# Patient Record
Sex: Male | Born: 1960 | Race: Black or African American | Hispanic: No | Marital: Married | Smoking: Former smoker
Health system: Southern US, Community
[De-identification: ages and names within clinical notes are randomized; demographics above are authoritative.]

## PROBLEM LIST (undated history)

## (undated) DIAGNOSIS — I77819 Aortic ectasia, unspecified site: Secondary | ICD-10-CM

## (undated) DIAGNOSIS — I428 Other cardiomyopathies: Secondary | ICD-10-CM

## (undated) DIAGNOSIS — M069 Rheumatoid arthritis, unspecified: Secondary | ICD-10-CM

## (undated) DIAGNOSIS — I4892 Unspecified atrial flutter: Secondary | ICD-10-CM

## (undated) DIAGNOSIS — L930 Discoid lupus erythematosus: Secondary | ICD-10-CM

## (undated) DIAGNOSIS — K219 Gastro-esophageal reflux disease without esophagitis: Secondary | ICD-10-CM

## (undated) DIAGNOSIS — I96 Gangrene, not elsewhere classified: Secondary | ICD-10-CM

## (undated) DIAGNOSIS — I77810 Thoracic aortic ectasia: Secondary | ICD-10-CM

## (undated) DIAGNOSIS — I43 Cardiomyopathy in diseases classified elsewhere: Secondary | ICD-10-CM

## (undated) DIAGNOSIS — M109 Gout, unspecified: Secondary | ICD-10-CM

## (undated) DIAGNOSIS — R000 Tachycardia, unspecified: Secondary | ICD-10-CM

## (undated) DIAGNOSIS — Z0389 Encounter for observation for other suspected diseases and conditions ruled out: Secondary | ICD-10-CM

## (undated) DIAGNOSIS — I10 Essential (primary) hypertension: Secondary | ICD-10-CM

## (undated) DIAGNOSIS — IMO0001 Reserved for inherently not codable concepts without codable children: Secondary | ICD-10-CM

## (undated) DIAGNOSIS — D860 Sarcoidosis of lung: Secondary | ICD-10-CM

## (undated) DIAGNOSIS — J8410 Pulmonary fibrosis, unspecified: Secondary | ICD-10-CM

## (undated) DIAGNOSIS — Z9289 Personal history of other medical treatment: Secondary | ICD-10-CM

## (undated) DIAGNOSIS — D589 Hereditary hemolytic anemia, unspecified: Secondary | ICD-10-CM

## (undated) DIAGNOSIS — I471 Supraventricular tachycardia, unspecified: Secondary | ICD-10-CM

## (undated) DIAGNOSIS — D649 Anemia, unspecified: Secondary | ICD-10-CM

## (undated) HISTORY — DX: Unspecified atrial flutter: I48.92

## (undated) HISTORY — DX: Essential (primary) hypertension: I10

## (undated) HISTORY — DX: Cardiomyopathy in diseases classified elsewhere: R00.0

## (undated) HISTORY — DX: Supraventricular tachycardia, unspecified: I47.10

## (undated) HISTORY — DX: Discoid lupus erythematosus: L93.0

## (undated) HISTORY — DX: Tachycardia, unspecified: I43

## (undated) HISTORY — DX: Anemia, unspecified: D64.9

## (undated) HISTORY — DX: Gout, unspecified: M10.9

## (undated) HISTORY — DX: Thoracic aortic ectasia: I77.810

## (undated) HISTORY — DX: Aortic ectasia, unspecified site: I77.819

## (undated) HISTORY — DX: Gangrene, not elsewhere classified: I96

## (undated) HISTORY — DX: Other cardiomyopathies: I42.8

## (undated) HISTORY — DX: Personal history of other medical treatment: Z92.89

## (undated) HISTORY — DX: Sarcoidosis of lung: D86.0

## (undated) HISTORY — DX: Rheumatoid arthritis, unspecified: M06.9

## (undated) HISTORY — DX: Pulmonary fibrosis, unspecified: J84.10

## (undated) HISTORY — PX: HAND SURGERY: SHX662

## (undated) HISTORY — DX: Hereditary hemolytic anemia, unspecified: D58.9

---

## 2004-08-30 ENCOUNTER — Ambulatory Visit: Payer: Self-pay | Admitting: Family Medicine

## 2007-05-11 ENCOUNTER — Ambulatory Visit: Payer: Self-pay | Admitting: Emergency Medicine

## 2007-05-11 ENCOUNTER — Other Ambulatory Visit: Payer: Self-pay

## 2010-04-10 ENCOUNTER — Emergency Department: Payer: Self-pay | Admitting: Emergency Medicine

## 2010-06-20 ENCOUNTER — Ambulatory Visit: Payer: Self-pay | Admitting: Family Medicine

## 2010-09-14 ENCOUNTER — Emergency Department: Payer: Self-pay | Admitting: Emergency Medicine

## 2011-11-22 ENCOUNTER — Ambulatory Visit: Payer: Self-pay | Admitting: Family Medicine

## 2013-01-07 ENCOUNTER — Ambulatory Visit: Payer: Self-pay | Admitting: Family Medicine

## 2013-03-10 ENCOUNTER — Ambulatory Visit: Payer: Self-pay | Admitting: Family Medicine

## 2013-08-17 ENCOUNTER — Ambulatory Visit: Payer: Self-pay | Admitting: Physician Assistant

## 2013-09-02 ENCOUNTER — Ambulatory Visit: Payer: Self-pay | Admitting: Emergency Medicine

## 2013-09-11 ENCOUNTER — Ambulatory Visit: Payer: Self-pay | Admitting: Emergency Medicine

## 2013-09-23 ENCOUNTER — Emergency Department: Payer: Self-pay | Admitting: Emergency Medicine

## 2013-09-23 LAB — BASIC METABOLIC PANEL
Anion Gap: 7 (ref 7–16)
BUN: 15 mg/dL (ref 7–18)
CHLORIDE: 101 mmol/L (ref 98–107)
Calcium, Total: 9.4 mg/dL (ref 8.5–10.1)
Co2: 28 mmol/L (ref 21–32)
Creatinine: 0.65 mg/dL (ref 0.60–1.30)
EGFR (Non-African Amer.): >60
Glucose: 114 mg/dL — ABNORMAL HIGH (ref 65–99)
Osmolality: 274 (ref 275–301)
Potassium: 3.8 mmol/L (ref 3.5–5.1)
Sodium: 136 mmol/L (ref 136–145)

## 2013-09-23 LAB — CBC
HCT: 34 % — ABNORMAL LOW (ref 40.0–52.0)
HGB: 11 g/dL — ABNORMAL LOW (ref 13.0–18.0)
MCH: 26.7 pg (ref 26.0–34.0)
MCHC: 32.4 g/dL (ref 32.0–36.0)
MCV: 82 fL (ref 80–100)
Platelet: 229 10*3/uL (ref 150–440)
RBC: 4.12 10*6/uL — ABNORMAL LOW (ref 4.40–5.90)
RDW: 15 % — AB (ref 11.5–14.5)
WBC: 7.4 10*3/uL (ref 3.8–10.6)

## 2013-09-23 LAB — TROPONIN I
TROPONIN-I: 0.06 ng/mL — AB
Troponin-I: 0.05 ng/mL

## 2013-09-23 LAB — D-DIMER(ARMC): D-Dimer: 971 ng/ml

## 2013-09-23 LAB — PRO B NATRIURETIC PEPTIDE: B-Type Natriuretic Peptide: 165 pg/mL — ABNORMAL HIGH (ref 0–125)

## 2013-11-02 DIAGNOSIS — R590 Localized enlarged lymph nodes: Secondary | ICD-10-CM

## 2013-11-02 HISTORY — DX: Localized enlarged lymph nodes: R59.0

## 2013-11-14 ENCOUNTER — Ambulatory Visit: Payer: Self-pay | Admitting: Gastroenterology

## 2013-11-20 LAB — PATHOLOGY REPORT

## 2013-12-26 ENCOUNTER — Ambulatory Visit: Payer: Self-pay | Admitting: Specialist

## 2014-01-06 ENCOUNTER — Ambulatory Visit: Payer: Self-pay | Admitting: Family Medicine

## 2014-02-03 DIAGNOSIS — M351 Other overlap syndromes: Secondary | ICD-10-CM | POA: Insufficient documentation

## 2014-04-20 DIAGNOSIS — E041 Nontoxic single thyroid nodule: Secondary | ICD-10-CM | POA: Insufficient documentation

## 2014-08-10 ENCOUNTER — Ambulatory Visit: Payer: Self-pay | Admitting: Physician Assistant

## 2014-08-17 ENCOUNTER — Ambulatory Visit: Payer: Self-pay | Admitting: Physician Assistant

## 2014-08-19 ENCOUNTER — Ambulatory Visit: Payer: Self-pay | Admitting: Family Medicine

## 2014-08-19 LAB — D-DIMER(ARMC): D-DIMER: 976 ng/mL

## 2014-08-20 LAB — BASIC METABOLIC PANEL
Anion Gap: 10 (ref 7–16)
BUN: 20 mg/dL
CHLORIDE: 99 mmol/L — AB
Calcium, Total: 9.3 mg/dL
Co2: 27 mmol/L
Creatinine: 1.08 mg/dL
EGFR (African American): >60
EGFR (Non-African Amer.): >60
Glucose: 87 mg/dL
POTASSIUM: 3.6 mmol/L
Sodium: 136 mmol/L

## 2014-08-20 LAB — CBC WITH DIFFERENTIAL/PLATELET
BASOS ABS: 0.1 10*3/uL (ref 0.0–0.1)
Basophil %: 0.8 %
EOS ABS: 0.1 10*3/uL (ref 0.0–0.7)
Eosinophil %: 0.7 %
HCT: 37 % — AB (ref 40.0–52.0)
HGB: 12.2 g/dL — ABNORMAL LOW (ref 13.0–18.0)
LYMPHS PCT: 12.1 %
Lymphocyte #: 1.2 10*3/uL (ref 1.0–3.6)
MCH: 27.3 pg (ref 26.0–34.0)
MCHC: 32.9 g/dL (ref 32.0–36.0)
MCV: 83 fL (ref 80–100)
MONO ABS: 1.1 x10 3/mm — AB (ref 0.2–1.0)
Monocyte %: 11.2 %
NEUTROS ABS: 7.7 10*3/uL — AB (ref 1.4–6.5)
Neutrophil %: 75.2 %
Platelet: 253 10*3/uL (ref 150–440)
RBC: 4.46 10*6/uL (ref 4.40–5.90)
RDW: 14.9 % — AB (ref 11.5–14.5)
WBC: 10.3 10*3/uL (ref 3.8–10.6)

## 2014-08-24 ENCOUNTER — Inpatient Hospital Stay
Admit: 2014-08-24 | Disposition: A | Discharge status: Home / Self Care | Payer: Self-pay | Attending: Internal Medicine | Admitting: Internal Medicine

## 2014-08-24 ENCOUNTER — Ambulatory Visit: Payer: Self-pay | Admitting: Gastroenterology

## 2014-08-24 LAB — CBC
HCT: 36.9 % — AB (ref 40.0–52.0)
HGB: 11.8 g/dL — AB (ref 13.0–18.0)
MCH: 27.1 pg (ref 26.0–34.0)
MCHC: 32.1 g/dL (ref 32.0–36.0)
MCV: 84 fL (ref 80–100)
Platelet: 289 10*3/uL (ref 150–440)
RBC: 4.37 10*6/uL — AB (ref 4.40–5.90)
RDW: 15 % — ABNORMAL HIGH (ref 11.5–14.5)
WBC: 8.7 10*3/uL (ref 3.8–10.6)

## 2014-08-24 LAB — BASIC METABOLIC PANEL
Anion Gap: 6 — ABNORMAL LOW (ref 7–16)
BUN: 16 mg/dL
Calcium, Total: 9 mg/dL
Chloride: 104 mmol/L
Co2: 29 mmol/L
Creatinine: 0.97 mg/dL
Glucose: 75 mg/dL
Potassium: 4 mmol/L
SODIUM: 139 mmol/L

## 2014-08-24 LAB — TROPONIN I
Troponin-I: 0.05 ng/mL — ABNORMAL HIGH
Troponin-I: 0.05 ng/mL — ABNORMAL HIGH

## 2014-08-24 LAB — CK-MB
CK-MB: 16.3 ng/mL — ABNORMAL HIGH
CK-MB: 14.3 ng/mL — ABNORMAL HIGH

## 2014-08-25 DIAGNOSIS — I361 Nonrheumatic tricuspid (valve) insufficiency: Secondary | ICD-10-CM | POA: Diagnosis not present

## 2014-08-25 DIAGNOSIS — I5023 Acute on chronic systolic (congestive) heart failure: Secondary | ICD-10-CM

## 2014-08-25 DIAGNOSIS — I4892 Unspecified atrial flutter: Secondary | ICD-10-CM

## 2014-08-25 DIAGNOSIS — R7989 Other specified abnormal findings of blood chemistry: Secondary | ICD-10-CM | POA: Diagnosis not present

## 2014-08-25 LAB — CBC WITH DIFFERENTIAL/PLATELET
BASOS ABS: 0 10*3/uL (ref 0.0–0.1)
Basophil %: 0.5 %
Eosinophil #: 0.2 10*3/uL (ref 0.0–0.7)
Eosinophil %: 2.3 %
HCT: 33.4 % — AB (ref 40.0–52.0)
HGB: 10.7 g/dL — ABNORMAL LOW (ref 13.0–18.0)
LYMPHS PCT: 9.4 %
Lymphocyte #: 0.7 10*3/uL — ABNORMAL LOW (ref 1.0–3.6)
MCH: 26.8 pg (ref 26.0–34.0)
MCHC: 32 g/dL (ref 32.0–36.0)
MCV: 84 fL (ref 80–100)
MONOS PCT: 5.1 %
Monocyte #: 0.4 x10 3/mm (ref 0.2–1.0)
NEUTROS PCT: 82.7 %
Neutrophil #: 5.9 10*3/uL (ref 1.4–6.5)
PLATELETS: 241 10*3/uL (ref 150–440)
RBC: 3.98 10*6/uL — AB (ref 4.40–5.90)
RDW: 14.9 % — ABNORMAL HIGH (ref 11.5–14.5)
WBC: 7.1 10*3/uL (ref 3.8–10.6)

## 2014-08-25 LAB — BASIC METABOLIC PANEL
Anion Gap: 6 — ABNORMAL LOW (ref 7–16)
BUN: 16 mg/dL
CALCIUM: 8.2 mg/dL — AB
CO2: 26 mmol/L
Chloride: 106 mmol/L
Creatinine: 0.85 mg/dL
EGFR (African American): >60
Glucose: 101 mg/dL — ABNORMAL HIGH
Potassium: 3.4 mmol/L — ABNORMAL LOW
SODIUM: 138 mmol/L

## 2014-08-25 LAB — TROPONIN I: Troponin-I: 0.07 ng/mL — ABNORMAL HIGH

## 2014-08-25 LAB — LIPID PANEL
Cholesterol: 88 mg/dL
HDL: 24 mg/dL — AB
LDL CHOLESTEROL, CALC: 39 mg/dL
TRIGLYCERIDES: 124 mg/dL
VLDL Cholesterol, Calc: 25 mg/dL

## 2014-08-25 LAB — CK-MB: CK-MB: 14.2 ng/mL — ABNORMAL HIGH

## 2014-08-25 LAB — MAGNESIUM: MAGNESIUM: 1.5 mg/dL — AB

## 2014-08-25 LAB — TSH: THYROID STIMULATING HORM: 2.273 u[IU]/mL

## 2014-08-26 ENCOUNTER — Encounter: Payer: Self-pay | Admitting: Physician Assistant

## 2014-08-26 DIAGNOSIS — I43 Cardiomyopathy in diseases classified elsewhere: Secondary | ICD-10-CM | POA: Insufficient documentation

## 2014-08-26 DIAGNOSIS — M069 Rheumatoid arthritis, unspecified: Secondary | ICD-10-CM | POA: Insufficient documentation

## 2014-08-26 DIAGNOSIS — I10 Essential (primary) hypertension: Secondary | ICD-10-CM | POA: Insufficient documentation

## 2014-08-26 DIAGNOSIS — I4892 Unspecified atrial flutter: Secondary | ICD-10-CM | POA: Insufficient documentation

## 2014-08-26 DIAGNOSIS — R000 Tachycardia, unspecified: Secondary | ICD-10-CM

## 2014-08-26 DIAGNOSIS — J8410 Pulmonary fibrosis, unspecified: Secondary | ICD-10-CM | POA: Insufficient documentation

## 2014-08-26 LAB — CBC WITH DIFFERENTIAL/PLATELET
BASOS PCT: 0.5 %
Basophil #: 0 10*3/uL (ref 0.0–0.1)
EOS ABS: 0.2 10*3/uL (ref 0.0–0.7)
EOS PCT: 3.9 %
HCT: 31 % — AB (ref 40.0–52.0)
HGB: 9.9 g/dL — AB (ref 13.0–18.0)
LYMPHS PCT: 15.4 %
Lymphocyte #: 0.8 10*3/uL — ABNORMAL LOW (ref 1.0–3.6)
MCH: 27 pg (ref 26.0–34.0)
MCHC: 31.9 g/dL — AB (ref 32.0–36.0)
MCV: 85 fL (ref 80–100)
MONOS PCT: 10.6 %
Monocyte #: 0.6 x10 3/mm (ref 0.2–1.0)
NEUTROS ABS: 3.7 10*3/uL (ref 1.4–6.5)
Neutrophil %: 69.6 %
Platelet: 241 10*3/uL (ref 150–440)
RBC: 3.66 10*6/uL — AB (ref 4.40–5.90)
RDW: 14.7 % — ABNORMAL HIGH (ref 11.5–14.5)
WBC: 5.3 10*3/uL (ref 3.8–10.6)

## 2014-08-26 LAB — BASIC METABOLIC PANEL
ANION GAP: 2 — AB (ref 7–16)
BUN: 14 mg/dL
CALCIUM: 8.1 mg/dL — AB
CO2: 27 mmol/L
Chloride: 108 mmol/L
Creatinine: 0.86 mg/dL
EGFR (African American): >60
EGFR (Non-African Amer.): >60
Glucose: 101 mg/dL — ABNORMAL HIGH
Potassium: 3.9 mmol/L
Sodium: 137 mmol/L

## 2014-08-27 DIAGNOSIS — I361 Nonrheumatic tricuspid (valve) insufficiency: Secondary | ICD-10-CM | POA: Diagnosis not present

## 2014-08-27 DIAGNOSIS — I4892 Unspecified atrial flutter: Secondary | ICD-10-CM

## 2014-08-27 LAB — CBC WITH DIFFERENTIAL/PLATELET
BASOS PCT: 0.5 %
Basophil #: 0 10*3/uL (ref 0.0–0.1)
Eosinophil #: 0.3 10*3/uL (ref 0.0–0.7)
Eosinophil %: 3.9 %
HCT: 35.1 % — ABNORMAL LOW (ref 40.0–52.0)
HGB: 11.1 g/dL — AB (ref 13.0–18.0)
LYMPHS ABS: 1.2 10*3/uL (ref 1.0–3.6)
Lymphocyte %: 18.2 %
MCH: 27 pg (ref 26.0–34.0)
MCHC: 31.8 g/dL — ABNORMAL LOW (ref 32.0–36.0)
MCV: 85 fL (ref 80–100)
MONO ABS: 0.6 x10 3/mm (ref 0.2–1.0)
Monocyte %: 9.3 %
NEUTROS PCT: 68.1 %
Neutrophil #: 4.5 10*3/uL (ref 1.4–6.5)
Platelet: 283 10*3/uL (ref 150–440)
RBC: 4.13 10*6/uL — AB (ref 4.40–5.90)
RDW: 14.6 % — ABNORMAL HIGH (ref 11.5–14.5)
WBC: 6.6 10*3/uL (ref 3.8–10.6)

## 2014-08-27 LAB — BASIC METABOLIC PANEL
ANION GAP: 3 — AB (ref 7–16)
BUN: 16 mg/dL
CALCIUM: 8.6 mg/dL — AB
CHLORIDE: 108 mmol/L
Co2: 26 mmol/L
Creatinine: 0.85 mg/dL
EGFR (African American): >60
EGFR (Non-African Amer.): >60
GLUCOSE: 94 mg/dL
Potassium: 4.1 mmol/L
Sodium: 137 mmol/L

## 2014-08-27 LAB — MAGNESIUM: Magnesium: 1.8 mg/dL

## 2014-08-28 ENCOUNTER — Telehealth: Payer: Self-pay

## 2014-08-28 ENCOUNTER — Encounter: Payer: Self-pay | Admitting: Cardiovascular Disease

## 2014-08-28 DIAGNOSIS — I361 Nonrheumatic tricuspid (valve) insufficiency: Secondary | ICD-10-CM

## 2014-08-28 DIAGNOSIS — I4892 Unspecified atrial flutter: Secondary | ICD-10-CM | POA: Diagnosis not present

## 2014-08-28 NOTE — Telephone Encounter (Signed)
Patient contacted regarding discharge from [facility] on 08/28/14.  Patient understands to follow up with Dr. Rockey Situ on 09/18/14 at 2:30 at [facility]. Patient understands discharge instructions? yes Patient understands medications and regiment? yes Patient understands to bring all medications to this visit? yes

## 2014-08-28 NOTE — Telephone Encounter (Signed)
Attempted to contact pt regarding discharge from [facility] on 08/28/14. Left detailed message on pt's vm asking him to call back w/ any questions or concerns regarding his medications and/or discharge instructions. Advised him of appt w/ Dr. Rockey Situ on 09/18/14 @ 2:30. Asked him to call back if he is unable to keep this appt.

## 2014-08-29 LAB — CULTURE, BLOOD (SINGLE)

## 2014-08-31 ENCOUNTER — Ambulatory Visit (INDEPENDENT_AMBULATORY_CARE_PROVIDER_SITE_OTHER): Payer: 59 | Admitting: Physician Assistant

## 2014-08-31 ENCOUNTER — Encounter (INDEPENDENT_AMBULATORY_CARE_PROVIDER_SITE_OTHER): Payer: Self-pay

## 2014-08-31 ENCOUNTER — Encounter: Payer: Self-pay | Admitting: Physician Assistant

## 2014-08-31 VITALS — BP 168/88 | HR 81 | Ht 70.0 in | Wt 214.2 lb

## 2014-08-31 DIAGNOSIS — R0602 Shortness of breath: Secondary | ICD-10-CM | POA: Diagnosis not present

## 2014-08-31 DIAGNOSIS — I4892 Unspecified atrial flutter: Secondary | ICD-10-CM | POA: Diagnosis not present

## 2014-08-31 DIAGNOSIS — R000 Tachycardia, unspecified: Secondary | ICD-10-CM

## 2014-08-31 DIAGNOSIS — I159 Secondary hypertension, unspecified: Secondary | ICD-10-CM | POA: Diagnosis not present

## 2014-08-31 DIAGNOSIS — R5383 Other fatigue: Secondary | ICD-10-CM

## 2014-08-31 DIAGNOSIS — J8410 Pulmonary fibrosis, unspecified: Secondary | ICD-10-CM

## 2014-08-31 DIAGNOSIS — I43 Cardiomyopathy in diseases classified elsewhere: Secondary | ICD-10-CM

## 2014-08-31 MED ORDER — METOPROLOL TARTRATE 50 MG PO TABS: 75.0000 mg | ORAL_TABLET | Freq: Two times a day (BID) | ORAL | Status: DC

## 2014-08-31 NOTE — Patient Instructions (Addendum)
Please start Lopressor 75 mg twice daily  We will draw labs today:  CBC, BMET  Your physician recommends that you schedule a follow-up appointment in: 3 months  [place] caregiver has ordered a Stress Test with nuclear imaging. The purpose of this test is to evaluate the blood supply to your heart muscle. This procedure is referred to as a "Non-Invasive Stress Test." This is because other than having an IV started in your vein, nothing is inserted or "invades" your body. Cardiac stress tests are done to find areas of poor blood flow to the heart by determining the extent of coronary artery disease (CAD). Some patients exercise on a treadmill, which naturally increases the blood flow to your heart, while others who are  unable to walk on a treadmill due to physical limitations have a pharmacologic/chemical stress agent called Lexiscan . This medicine will mimic walking on a treadmill by temporarily increasing your coronary blood flow.   Please note: these test may take anywhere between 2-4 hours to complete  PLEASE REPORT TO [place] AT THE FIRST DESK WILL DIRECT YOU WHERE TO GO  Date of Procedure:____Wednesday, April 6_________  Arrival Time for Procedure:____7:15 am_________  Instructions regarding medication:   __X__:  Hold betablocker(s) night before procedure and morning of procedure: LOPRESSOR   PLEASE NOTIFY THE OFFICE AT LEAST 24 HOURS IN ADVANCE IF YOU ARE UNABLE TO KEEP YOUR APPOINTMENT.  [phone] AND  PLEASE NOTIFY NUCLEAR MEDICINE AT [facility] AT LEAST 24 HOURS IN ADVANCE IF YOU ARE UNABLE TO KEEP YOUR APPOINTMENT. [phone]  How to prepare for your Myoview test:  1. Do not eat or drink after midnight 2. No caffeine for 24 hours prior to test 3. No smoking 24 hours prior to test. 4. Your medication may be taken with water.  If your doctor stopped a medication because of this test, do not take that medication. 5. Ladies, please  do not wear dresses.  Skirts or pants are appropriate. Please wear a short sleeve shirt. 6. No perfume, cologne or lotion.

## 2014-08-31 NOTE — Progress Notes (Signed)
Cardiology Hospital Follow Up Note:   Date of Encounter: 08/31/2014  ID: [name], DOB 07-Jan-1961, MRN [id number]  PCP: Otilio Miu, MD Primary Cardiologist: Dr. Rockey Situ, MD  Chief Complaint  Patient presents with  . other    Follow up from [facility]; rapid heart beats. Meds reviewed by the patient's med list from [facility].      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   Patient presented to [facility] with a 3 week history of palpitations and increased dyspnea and was found to be in new onset atrial flutter 2:1 conduction. At that time he did not have any previously known cardiac history and had never seen a cardiologist before. No prior stress testing or cardiac catheterizations. He has history of pulmonary fibrosis, not on home oxygen. He is quite active at baseline and works an active job.   Prior to his admission on 3/28 over the past 3 weeks he had been experiencing increased dyspnea and new onset of palpitations. He was seen by outside urgent care on 3/14. He was diagnosed with bronchitis/early PNA and treated with Levaquin and Sudafed, pulse 91 at that OV. He presented back to the urgent care on 3/21 with the plan of possibility of increasing his usual prednisone (he was already on this for his pulmonary fibrosis), pulse 80 at this OV. He followed up again on 3/23 and he was advised to see pulmonology. EKG was done at the 3/23 OV which showed atrial flutter, 155 bpm, left anterior fascicular block. D-dimer was also elevated at that visit at 976. CTA chest on 3/23 showed no PE. He subsequently followed up with his PCP who advised him to come to the ED.   Upon his arrival to the ED he was noted to be tachycardic with  HR around 150. He was due to come to [facility] on 3/28 anyway for routine screening colonoscopy, however this was cancelled 2/2 tachycardia. He received Cardizem 10 mg IV without improvement in HR. He was placed on metoprolol 25 mg bid. Labs showed troponin 0.05-->0.05-->0.07, K+ 4.0-->3.4 (felt to be demand ischemia 2/2 tachycardia), TSH 2.273. TTE showed unable to exclude arrhythmia such as atrial flutter, EF 20-25%, global HK, possible bicuspid aortic valve. EKG, atrial flutter with 2:1 conduction, 152 bpm, incomplete RBBB, left anterior fascicular block, no significant st/t changes.   Of note, he has not had any swelling of the extremities, abdominal swelling, PND, orthopnea, or chest pain. He does not eat much salt or drink increased amounts of fluids. He previously smoked 1 pack per day for 20 years. He drinks a rare mixed drink. Previously smoked MJ years ago, denies all other illicit drugs.   He was started on Xarelto 20 mg qhs. Medical therapy (digoxin, diltiazem, and Lopressor) were unable to rate control him as he continued to have a heart rate of 150, asymptomatic. He underwent successful TEE/DCCV on 4/1 with TEE showing EF 30-35%, no intracardiac thrombus seen, mildly dilated left atrium, mild to moderate aortic aortic sclerosis with evidence of stenosis.   He was continued on Xarelto 20 mg, Lopressor 50 mg bid, and his ACEi was restarted.   He comes in today stating he is feeling better. He continues to advance his activity as  tolerated. He is taking his medications as directed (Lopressor 50 mg bid, Xarelto 20 mg q supper, enalapril 20 mg daily, and HCTZ 25 mg daily). He has not felt any palpitations since his discharge. His fatigue continues to improve. No chest pain. He does not check his blood pressure at home.     Past Medical History  Diagnosis Date  . Atrial flutter     a. on Xarelto; b. s/p successful TEE/DCCV on 08/28/2014  . Pulmonary fibrosis     a. not on home oxygen  . RA  (rheumatoid arthritis)   . HTN (hypertension)   . Tachycardia-induced cardiomyopathy     a. echo 07/2014: EF 20-25%, cannot exclude arrhythmia such as atrial flutter,  global HK, possible bicuspid aortic valve, moderately reduced RV systolic fxn, mild-mod aortic valve sclerosis/calcification, mod TR, mildly elevated RVSP; b. TEE 08/28/2014: EF 30-35%, no intracardic thrombus, mildly dilated LA/RA, mild TR, mild to moderate aortic sclerosis without stenosis  : History reviewed. No pertinent past surgical history.: Family History  Problem Relation Age of Onset  . Family history unknown: Yes  :  reports that he quit smoking about 10 years ago. His smoking use included Cigarettes. He has a 20 pack-year smoking history. He does not have any smokeless tobacco history on file. He reports that he does not drink alcohol or use illicit drugs.:   Allergies:  No Known Allergies   Home Medications:  Current Outpatient Prescriptions  Medication Sig Dispense Refill  . enalapril (VASOTEC) 20 MG tablet Take 20 mg by mouth daily.    . hydrochlorothiazide (HYDRODIURIL) 25 MG tablet Take 25 mg by mouth daily.    . rivaroxaban (XARELTO) 20 MG TABS tablet Take 20 mg by mouth daily with supper.    . metoprolol (LOPRESSOR) 50 MG tablet Take 1.5 tablets (75 mg total) by mouth 2 (two) times daily. 90 tablet 6   No current facility-administered medications for this visit.     Review of Systems:  Review of Systems  Constitutional: Positive for malaise/fatigue. Negative for fever, chills, weight loss and diaphoresis.  Eyes: Negative for blurred vision and double vision.  Respiratory: Negative for cough, hemoptysis, sputum production, shortness of breath and wheezing.   Cardiovascular: Negative for chest pain, palpitations, orthopnea, claudication, leg swelling and PND.  Gastrointestinal: Negative for blood in stool and melena.  Musculoskeletal: Negative for myalgias and falls.  Neurological: Positive for  weakness. Negative for focal weakness.  All other systems reviewed and are negative.   Physical Exam:  Blood pressure 168/88, pulse 81, height 5\' 10"  (1.778 m), weight 214 lb 4 oz (97.183 kg). Body mass index is 30.74 kg/(m^2). General: Pleasant, NAD Psych: Normal affect. Neuro: Alert and oriented X 3. Moves all extremities spontaneously. HEENT: Normal  Neck: Supple without bruits or JVD. Lungs:  Resp regular and unlabored, CTA. Heart: Irregular, no s3, s4, or murmurs. Abdomen: Soft, non-tender, non-distended, BS + x 4.  Extremities: No clubbing, cyanosis or edema. DP/PT/Radials 2+ and equal bilaterally.   Accessory Clinical Findings:  EKG - NSR with PACs, 81 bpm, left anterior fascicular block, no significant st/t changes   Other studies Reviewed: Additional studies/ records that were reviewed today include: all [facility] records.  Recent Labs: See [facility] labs.   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   1. Atrial flutter s/p TEE/DCCV on 4/1:  -Remains in NSR, rate controlled  -Continue Xarelto 20 mg q supper  -Increase Lopressor to 75 mg bid   2. Acute on chronic systolic CHF:  -Possibly tachy-mediated vs less likely ischemic  -Schedule Lexiscan Myoview   -Continue enalapril 20 mg daily   -Continue Lopressor (2/2 #1)  -Repeat echo in 1 month to evaluate LV function   3. History of elevated troponin:  -Likely supply demand ischemia in the setting of tachycardia  -No anginal symptoms currently  -Plan for nuclear stress testing  -Rate control as above   4. HTN:  -Uncontrolled -Increase Lopressor to 75  mg bid  -Continue medications as above  -Follow up with PCP  5. Pulmonary fibrosis:  -Stable  -Follow up with pulmonary   Dispo: -Nuclear stress test -Repeat echo -Follow up 3 months   Christell Faith, PA-C [place] [address] [place], [place] [address] [phone] [place] 08/31/2014, 2:19 PM

## 2014-09-01 LAB — CBC WITH DIFFERENTIAL/PLATELET
BASOS: 0 %
Basophils Absolute: 0 10*3/uL (ref 0.0–0.2)
EOS ABS: 0.2 10*3/uL (ref 0.0–0.4)
Eos: 2 %
HEMATOCRIT: 38.1 % (ref 37.5–51.0)
Hemoglobin: 12.7 g/dL (ref 12.6–17.7)
IMMATURE GRANULOCYTES: 0 %
Immature Grans (Abs): 0 10*3/uL (ref 0.0–0.1)
Lymphocytes Absolute: 1.1 10*3/uL (ref 0.7–3.1)
Lymphs: 14 %
MCH: 27.7 pg (ref 26.6–33.0)
MCHC: 33.3 g/dL (ref 31.5–35.7)
MCV: 83 fL (ref 79–97)
MONOS ABS: 0.6 10*3/uL (ref 0.1–0.9)
Monocytes: 7 %
NEUTROS ABS: 6.3 10*3/uL (ref 1.4–7.0)
NEUTROS PCT: 77 %
Platelets: 330 10*3/uL (ref 150–379)
RBC: 4.58 x10E6/uL (ref 4.14–5.80)
RDW: 15.4 % (ref 12.3–15.4)
WBC: 8.3 10*3/uL (ref 3.4–10.8)

## 2014-09-01 LAB — BASIC METABOLIC PANEL
BUN / CREAT RATIO: 17 (ref 9–20)
BUN: 15 mg/dL (ref 6–24)
CALCIUM: 9.3 mg/dL (ref 8.7–10.2)
CO2: 18 mmol/L (ref 18–29)
Chloride: 103 mmol/L (ref 97–108)
Creatinine, Ser: 0.89 mg/dL (ref 0.76–1.27)
GFR calc Af Amer: 111 mL/min/{1.73_m2} (ref >59)
GFR calc non Af Amer: 96 mL/min/{1.73_m2} (ref >59)
Glucose: 88 mg/dL (ref 65–99)
Potassium: 4.4 mmol/L (ref 3.5–5.2)
Sodium: 142 mmol/L (ref 134–144)

## 2014-09-02 ENCOUNTER — Other Ambulatory Visit: Payer: Self-pay

## 2014-09-02 ENCOUNTER — Ambulatory Visit
Admit: 2014-09-02 | Disposition: A | Discharge status: Home / Self Care | Payer: Self-pay | Attending: Cardiovascular Disease | Admitting: Cardiovascular Disease

## 2014-09-02 DIAGNOSIS — R5383 Other fatigue: Secondary | ICD-10-CM

## 2014-09-02 DIAGNOSIS — I159 Secondary hypertension, unspecified: Secondary | ICD-10-CM

## 2014-09-02 DIAGNOSIS — R0602 Shortness of breath: Secondary | ICD-10-CM

## 2014-09-02 DIAGNOSIS — I4892 Unspecified atrial flutter: Secondary | ICD-10-CM

## 2014-09-03 ENCOUNTER — Other Ambulatory Visit: Payer: Self-pay

## 2014-09-03 ENCOUNTER — Telehealth: Payer: Self-pay | Admitting: Cardiovascular Disease

## 2014-09-03 DIAGNOSIS — I429 Cardiomyopathy, unspecified: Secondary | ICD-10-CM

## 2014-09-03 NOTE — Telephone Encounter (Signed)
Please see result note 

## 2014-09-03 NOTE — Telephone Encounter (Signed)
New message      Patient is returning Mandy's call in [place]

## 2014-09-08 LAB — TSH: Thyroid Stimulating Horm: 2.364 u[IU]/mL

## 2014-09-09 ENCOUNTER — Telehealth: Payer: Self-pay | Admitting: *Deleted

## 2014-09-09 NOTE — Telephone Encounter (Signed)
Left message on pt's vm that completed FMLA paperwork is at the front desk for him to p/u at his convenience.

## 2014-09-09 NOTE — Telephone Encounter (Signed)
Pt is calling asking about FMLA paper work.  States dr Ronnald Ramp from [place] was to send it to [place].  He is checking on it. Please call patient if we got it.

## 2014-09-10 ENCOUNTER — Telehealth: Payer: Self-pay | Admitting: Cardiovascular Disease

## 2014-09-10 NOTE — Telephone Encounter (Signed)
OK for 1 week, will need to adjust his ppw I just completed 4/13. Must keep echo appt.

## 2014-09-10 NOTE — Telephone Encounter (Signed)
Spoke w/ pt.  Advised him of Ryan's recommendation. He is appreciative and states that he has some more paperwork that needs to be completed and will drop this off tomorrow am.

## 2014-09-10 NOTE — Telephone Encounter (Signed)
Patient does not feel ready to return to work would like note for additional week out of work.  Patient does not feel strong enough yet.  Patient works on loading dock unloading and loading trucks worried that going back before 100% ready might be bad idea.  Please call patient.

## 2014-09-16 NOTE — Telephone Encounter (Signed)
Pt came into office dropped off FMLA paper work for old [employer] that needs to be filled out. He also needs a letter stating he can go back to work that includes treatment plans and that he is able to go back 09/21/14.  If we could fax that letter to [phone] atten: Vinnie Level  Please call patient when this is done. He would like to know for he wants to prepare for work.

## 2014-09-16 NOTE — Telephone Encounter (Signed)
Letter typed, awaiting FMLA ppw.

## 2014-09-16 NOTE — Telephone Encounter (Signed)
Spoke w/ pt.  Advised him that I am faxing his paperwork over.   Advised him that the paperwork dropped off requests his office notes and results. He verbalizes understanding and is agreeable to sending requested info.  He asks for a copy of return to work letter.  Advised him that I am leaving the original at the front desk to be picked up at his convenience.  He is appreciative and will call back w/ any questions or concerns.

## 2014-09-18 ENCOUNTER — Encounter: Payer: 59 | Admitting: Cardiovascular Disease

## 2014-09-22 ENCOUNTER — Telehealth: Payer: Self-pay

## 2014-09-22 NOTE — Telephone Encounter (Signed)
Pt states he has some paperwork that needs to be faxed to [facility]. They state they have not received this fax from [place]. Please call.

## 2014-09-22 NOTE — Telephone Encounter (Signed)
Spoke w/ pt.  Advised him that his paperwork was completed, faxed and the originals were left for him to p/u at the front desk. He states that they should have been faxed to [facility], as well, and he cannot return to work until they are.  Advised him that paperwork was faxed to # provided and we were not aware to send it anywhere else.  Advised him that since he has the originals, he can present this to where it needs to go or he can bring it here to fax if it needs to come from our office.  He verbalizes understanding and will let us know if we need to fax this.

## 2014-09-24 ENCOUNTER — Telehealth: Payer: Self-pay | Admitting: *Deleted

## 2014-09-24 NOTE — Telephone Encounter (Signed)
Pt needs [place] to re fax [employer] paperwork   [phone] atten susan fox

## 2014-09-24 NOTE — Telephone Encounter (Signed)
Please see previous phone note.  We do not have paperwork, pt has all originals.

## 2014-09-27 NOTE — H&P (Signed)
PATIENT NAME:  [name], [name] MR#:  [id number] DATE OF BIRTH:  03/17/1961  DATE OF ADMISSION:  08/24/2014  REFERRING PHYSICIAN: Lennette Bihari A. Kerman Passey, MD  PRIMARY CARE PHYSICIAN: Juline Patch, MD  PULMONOLOGIST: Freda Munro Raul Del, MD   CHIEF COMPLAINT: Palpitations.   HISTORY OF PRESENT ILLNESS: A 54 year old African American gentleman with a history of pulmonary fibrosis, rheumatoid arthritis, hypertension, essential, presenting with palpitations. He describes 2 to 3 weeks' duration of palpitations with associated dry cough, but states the cough is chronic. No shortness of breath, chest pains, or further symptomatology. Denies any fevers or chills. Of note, he has been seen by his PCP and urgent care for the above findings; however, had no improvement. He was actually scheduled to get a colonoscopy today, which was deferred given his tachycardia, heart rate of 150s, thus presented to the hospital for further workup and evaluation.   REVIEW OF SYSTEMS:  CONSTITUTIONAL: Denies any fevers, chills, fatigue, weakness.  EYES: Denies blurred vision, double vision, or eye pain.  EARS, NOSE, AND THROAT: Denies tinnitus, ear pain, or hearing loss.  RESPIRATORY: Deniescough, wheeze, or shortness of breath other than stated above.  CARDIOVASCULAR: Positive for palpitations. Denies any chest pain, edema, orthopnea.  GASTROINTESTINAL: Denies any nausea, vomiting, abdominal pain.  GENITOURINARY: Denies dysuria or hematuria.  ENDOCRINE: Denies nocturia or thyroid problems.  HEMATOLOGICAL AND LYMPHATIC: Denies easy bruising or bleeding.  SKIN: Denies rash or lesions.  MUSCULOSKELETAL: Denies pain in neck, back, shoulder, knees, hips, or arthritic symptoms.  NEUROLOGIC: Denies paralysis or paresthesias.  PSYCHIATRIC: Denies anxiety or depressive symptoms.   Otherwise, full review of systems performed by me is negative.   PAST MEDICAL HISTORY: Includes interstitial lung disease, pulmonary fibrosis,  non-oxygen dependent, rheumatoid arthritis, hypertension, essential.   SOCIAL HISTORY: Denies any tobacco use. Positive for occasional alcohol use.   FAMILY HISTORY: Denies any known cardiovascular or pulmonary disorders.   ALLERGIES: No known drug allergies.   HOME MEDICATIONS: Include prednisone 5 mg p.o. daily, Tylenol 500 mg 2 tablet p.o. q. 6 hours as needed for pain, enalapril 20 mg p.o. daily, Allegra 180 mg p.o. daily as needed for allergies, Plaquenil 200 mg p.o. b.i.d., Advair 250/50 mcg inhalation 1 puff b.i.d., hydrochlorothiazide 25 mg p.o. daily.   PHYSICAL EXAMINATION:  VITAL SIGNS: Temperature 98.7, heart rate 153, respirations 18, blood pressure 147/87, saturating 100% on room air. Weight 95.3 kg, BMI 30.1.  GENERAL: A well-nourished, well-developed, African American gentleman currently in no acute distress.  HEAD: Normocephalic, atraumatic.  EYES: Pupils equal, round, react to light. Extraocular muscles intact. No scleral icterus.  MOUTH: Moist mucosal membrane. Dentition intact. No abscess noted.  EARS, NOSE, THROAT: Clear without exudates. No external lesions.  NECK: Supple. No thyromegaly. No nodules. No JVD.  PULMONARY: Fine crackles at bilateral bases. No use of accessory muscles. Good respiratory effort.  CHEST: Nontender to palpation.  CARDIOVASCULAR: S1, S2, tachycardic, without murmurs, rubs, or gallops. No edema. Pedal pulses 2+ bilaterally.  GASTROINTESTINAL: Soft, nontender, nondistended. No masses. Positive bowel sounds. No hepatosplenomegaly.  MUSCULOSKELETAL: No swelling, clubbing, or edema. Range of motion full in all extremities.  NEUROLOGIC: Cranial nerves II through XII intact. No gross focal neurological deficit. Sensation intact. Reflexes intact.  SKIN: No ulcerations, lesions, rashes, or cyanosis. Skin warm, dry. Turgor intact.  PSYCHIATRIC: Mood and affect are within normal limits. The patient is awake, alert, oriented x 3. Insight and judgment  intact.   LABORATORY DATA: Chest x-ray performed, which reveals bibasilar lung opacifications  similar to previous findings. Remainder of laboratory data: Sodium 139, potassium 4, chloride 104, bicarbonate 29, BUN 16, creatinine 0.97, glucose 75. Troponin 0.05. WBC 8.7, hemoglobin 11.8, platelets of 289,000.   ASSESSMENT AND PLAN: A 54 year old [place] American gentleman with a history of pulmonary fibrosis, rheumatoid arthritis, presenting with palpitations.  1.  Narrow complex tachycardia/sinus tachycardia: Admit to telemetry. We will check a TSH, trend cardiac enzymes x 3, place on telemetry, check an echocardiogram, and consult cardiology.  2.  Elevated troponin: We will place on telemetry. Initiate aspirin and statin therapy. Trend cardiac enzymes x 3. If continues upward trend, we will initiate heparin.  3.  Hypertensive urgency: Continue home medications aside from hydrochlorothiazide given his mild dehydration. We will add as needed hydralazine as needed for blood pressure greater than 180/100.  4.  Pulmonary fibrosis: Continue with Advair. Supplemental oxygen as required.  5.  Venous thromboembolism prophylaxis: Heparin subcutaneous.   CODE STATUS: The patient is a FULL CODE.   TIME SPENT: 45 minutes.    ____________________________ Aaron Mose. Joandry Slagter, MD dkh:TT D: 08/24/2014 20:45:39 ET T: 08/24/2014 21:16:58 ET JOB#: [id number]  cc: Aaron Mose. Dorma Altman, MD, <Dictator> Elvie Palomo Woodfin Ganja MD ELECTRONICALLY SIGNED 08/24/2014 23:43

## 2014-09-27 NOTE — Consult Note (Signed)
General Aspect Primary Cardiologist: New to [facility] ______________  54 year old male with history of pulmonary fibrosis, rheumatoid arthritis, HTN who presented to [facility] on 3/28 with a 3 week history of palpitations and increased dyspnea and was found to be in new onset atrial flutter 2:1 conduction.  _____________  PMH: 1. Pulmonary fibrosis, not on home O2 2. RA 3. HTN ______________   Present Illness 54 year old male with the above problem list who presented to [facility] on 3/28 with a 3 week history of palpitations and increased dyspnea and was found to be in new onset atrial flutter with 2:1 conduction. He does not have any previously known cardiac history and has never seen a cardiologist before. No prior stress tests or cardiac caths. He has history of pulmonary fibrosis, not on home O2. He is quite active at baseline and works an active job.   Over the past 3 weeks he has been experiencing increased dyspnea and new onset of palpitations.  He was initially unable to get into be seen by his PCP 2/2 scheduling issues. He was seen by outside urgent care on August 10, 2014. He was diagnosed with bronchitis/early PNA and treated with Levaquin, and Sudafed. pulse 91. He took two days off from work. He presented back to the urgent care on 3/21 with the possibility of increasing his usual prednisone. Pulse 80. He followed up again on 3/23 and he was advised to see pulmonology. EKG was done at the 3/23 OV which showed atrial flutter, 155 bpm, left anterior fascicular block. D-dimer was also elevated at that visit at 976. CTA chest on 3/23 showed no PE. He followed up with his PCP who advised him to come to the ED.   Upon his arrival to the ED he was noted to be tachycardic with HR around 150. He was due to come to [facility] on 3/28 anyway for colonoscopy, however this was cancelled 2/2 tachycardiac. Colonoscopy was routine screening colonoscopy. He received Cardizem 10 mg IV without improvement in HR. He was  placed on metoprolol 25 mg bid. Labs showed troponin 0.05-->0.05-->0.07, K+ 4.0-->3.4, TSH 2.273,  He is resting comfortably in his bed.  echo unable to exclude arrhythmia such as atrial flutter, EF 20-25%, global HK, possible bicuspid aortic valve,  EKG, atrial flutter with 2:1 conduction, 152 bpm, incomplete RBBB, left anterior fascicular block, no significant st/t changes.   Of note, he has not had any swelling of the extremities, abdominal swelling, PND, orthopnea, or chest pain. He does not eat much salt or drink increased amounts of fluids. He previously smoked 1 pack per day for 20 years. He drinks a rare mixed drink. Previously smoked MJ years ago, denies all other illicit drugs.   Physical Exam:  GEN well developed, no acute distress   HEENT hearing intact to voice, moist oral mucosa   NECK supple  no JVD   RESP normal resp effort  crackles  crackles mid way of the posterior bases   CARD Regular rate and rhythm  Tachycardic  No murmur   ABD denies tenderness  soft   LYMPH negative neck   EXTR negative edema   SKIN normal to palpation   NEURO motor/sensory function intact   PSYCH alert, A+O to time, place, person, good insight   Review of Systems:  Subjective/Chief Complaint SOB   General: Fatigue  Weakness   Skin: No Complaints   ENT: No Complaints   Eyes: No Complaints   Neck: No Complaints  Respiratory: Short of breath   Cardiovascular: Palpitations  Dyspnea   Gastrointestinal: No Complaints   Genitourinary: No Complaints   Vascular: No Complaints   Musculoskeletal: No Complaints   Neurologic: No Complaints   Hematologic: No Complaints   Endocrine: No Complaints   Psychiatric: No Complaints   Review of Systems: All other systems were reviewed and found to be negative   Medications/Allergies Reviewed Medications/Allergies reviewed   Family & Social History:  Family and Social History:  Family History Negative  mother with lymphoma    Social History negative tobacco, positive ETOH, negative Illicit drugs, positive Illicit drugs, MJ years ago   Place of Living Home  lives with wife     Interstitial Lung Disease:    Rheumatoid Arthritis:    Currently being treated for staph infection:    HTN:   Home Medications: Medication Instructions Status  hydrochlorothiazide 25 mg oral tablet 1 tab(s) orally once a day Active  Allegra 180 mg oral tablet 1 tab(s) orally once a day, As Needed for allergies.  Active  Tylenol 500 mg oral tablet 2 tab(s) orally every 6 hours, As Needed - for Pain Active  enalapril 20 mg oral tablet 1 tab(s) orally once a day Active  hydroxychloroquine 200 mg oral tablet 1 tab(s) orally 2 times a day Active  predniSONE 5 mg oral tablet 1 tab(s) orally once a day Active  Advair Diskus 250 mcg-50 mcg inhalation powder 1 puff(s) inhaled 2 times a day Active   Lab Results:  Thyroid:  29-Mar-16 01:10   Thyroid Stimulating Hormone 2.273 (0.350-4.500 NOTE: New Reference Range  08/04/14)  Routine Micro:  28-Mar-16 20:49   Micro Text Report BLOOD CULTURE   COMMENT                   NO GROWTH IN 8-12 HOURS   ANTIBIOTIC                       Micro Text Report BLOOD CULTURE   COMMENT                   NO GROWTH IN 8-12 HOURS   ANTIBIOTIC                       Culture Comment NO GROWTH IN 8-12 HOURS  Result(s) reported on 25 Aug 2014 at 04:00AM.  Culture Comment NO GROWTH IN 8-12 HOURS  Result(s) reported on 25 Aug 2014 at 04:00AM.  Routine Chem:  29-Mar-16 01:10   Result Comment - TROPONIN - PREVIOUSLY CALLED ON 08/24/14  - AT 1757  Result(s) reported on 25 Aug 2014 at 03:03AM.  Cholesterol, Serum 88 (0-200 NOTE: New Reference Range  08/04/14)  Triglycerides, Serum 124 (0-149 NOTE: New Reference Range  08/04/14)  HDL (INHOUSE)  24 (40-1000 NOTE: New Reference Range:  08/04/14)  VLDL Cholesterol Calculated 25 (0-40 NOTE: New Reference Range  08/04/14)  LDL Cholesterol Calculated 39  (0-99 NOTE: New Reference Range:  08/04/14)  Glucose, Serum  101 (65-99 NOTE: New Reference Range  08/04/14)  BUN 16 (6-20 NOTE: New Reference Range  08/04/14)  Creatinine (comp) 0.85 (0.61-1.24 NOTE: New Reference Range  08/04/14)  Sodium, Serum 138 (135-145 NOTE: New Reference Range  08/04/14)  Potassium, Serum  3.4 (3.5-5.1 NOTE: New Reference Range  08/04/14)  Chloride, Serum 106 (101-111 NOTE: New Reference Range  08/04/14)  CO2, Serum 26 (22-32 NOTE: New Reference Range  08/04/14)  Calcium (Total),  Serum  8.2 (8.9-10.3 NOTE: New Reference Range  08/04/14)  Anion Gap  6  eGFR (African American) >60  eGFR (Non-African American) >60 (eGFR values <4m/min/1.73 m2 may be an indication of chronic kidney disease (CKD). Calculated eGFR is useful in patients with stable renal function. The eGFR calculation will not be reliable in acutely ill patients when serum creatinine is changing rapidly. It is not useful in patients on dialysis. The eGFR calculation may not be applicable to patients at the low and high extremes of body sizes, pregnant women, and vegetarians.)  Cardiac:  28-Mar-16 21:37   Troponin I  0.05 (0.00-0.03 0.03 ng/mL or less: NEGATIVE  Repeat testing in 3-6 hrs  if clinically indicated. >0.05 ng/mL: POTENTIAL  MYOCARDIAL INJURY. Repeat  testing in 3-6 hrs if  clinically indicated. NOTE: An increase or decrease  of 30% or more on serial  testing suggests a  clinically important change NOTE: New Reference Range  08/04/14)  29-Mar-16 01:10   CPK-MB, Serum  14.2 (0.5-5.0 NOTE: New Reference Range  08/04/14)  Troponin I  0.07 (0.00-0.03 0.03 ng/mL or less: NEGATIVE  Repeat testing in 3-6 hrs  if clinically indicated. >0.05 ng/mL: POTENTIAL  MYOCARDIAL INJURY. Repeat  testing in 3-6 hrs if  clinically indicated. NOTE: An increase or decrease  of 30% or more on serial  testing suggests a  clinically important change NOTE: New Reference Range   08/04/14)  Routine Hem:  29-Mar-16 01:10   WBC (CBC) 7.1  RBC (CBC)  3.98  Hemoglobin (CBC)  10.7  Hematocrit (CBC)  33.4  Platelet Count (CBC) 241  MCV 84  MCH 26.8  MCHC 32.0  RDW  14.9  Neutrophil % 82.7  Lymphocyte % 9.4  Monocyte % 5.1  Eosinophil % 2.3  Basophil % 0.5  Neutrophil # 5.9  Lymphocyte #  0.7  Monocyte # 0.4  Eosinophil # 0.2  Basophil # 0.0 (Result(s) reported on 25 Aug 2014 at 01:20AM.)   EKG:  EKG Interp. by me   Interpretation EKG shows atrial flutter with 2:1 conduction, 155 bpm, left anterior fascicular block, incomplete RBBB, no significant st/t changes   Radiology Results: XRay:    28-Mar-16 18:28, Chest Portable Single View  Chest Portable Single View   REASON FOR EXAM:    SOB, tachycardia  COMMENTS:       PROCEDURE: DXR - DXR PORTABLE CHEST SINGLE VIEW  - Aug 24 2014  6:28PM     CLINICAL DATA:  Short of breath.    EXAM:  PORTABLE CHEST - 1 VIEW    COMPARISON:  CT, 08/19/2014.  Chest radiograph, 08/17/2014.    FINDINGS:  Bibasilar lung opacity is noted without significant change  consistent with fibrosis. Superimposed pneumonia is not excluded.  There is no convincing pulmonary edema. Cardiac silhouette is mildly  enlarged. No mediastinal or hilar masses. No pneumothorax.     IMPRESSION:  Bibasilar lung opacity similar to the prior study. This may all be  fibrosis. Superimposed pneumonia/atelectasis is possible. No  pulmonary edema.      Electronically Signed    By: DLajean ManesM.D.    On:08/24/2014 18:51         Verified By: DLasandra Beech M.D.,  Cardiology:    29-Mar-16 07:43, Echo Doppler  Echo Doppler   REASON FOR EXAM:      COMMENTS:       [facility]  - Aug 25 2014  7:43AM  RESULT: Echocardiogram Report    Patient Name:   [name] Bloodgood Date of Exam: 08/25/2014  Medical Rec #:  [id number]  Custom1:  Date of Birth:  07/08/1960               Height:       72.0  in  Patient Age:    26 years                Weight:       210.0 lb  Patient Gender: M                       BSA:          2.18 m??    Indications: MI  Sonographer:    Sherrie Sport RDCS  Referring Phys: Valentino Nose, K    Sonographer Comments: Suboptimal apical window.    Summary:   1. Heart rate 150 bpm. Unable to exclude arrhythmia such as atrial   flutter   2. Left ventricular ejection fraction, by visual estimation, is 20 to   25%.   3. Severely decreased global left ventricular systolic function.   4. Global hypokinesis.   5. Moderately reduced RV systolic function.   6. Moderately enlarged right ventricle.   7. Select images suggestive of bicuspid aortic valve.   8. Mildto moderate aortic valve sclerosis/calcification   9. Moderate tricuspid regurgitation.  10. Mildly elevated RVSP  2D AND M-MODE MEASUREMENTS (normal ranges within parentheses):  Left Ventricle:          Normal  IVSd (2D):      1.17 cm (0.7-1.1)  LVPWd (2D):     1.27 cm (0.7-1.1) Aorta/LA:                  Normal  LVIDd (2D):     4.44 cm (3.4-5.7) Aortic Root (2D): 3.30 cm (2.4-3.7)  LVIDs (2D):     4.00 cm           Left Atrium (2D): 3.60 cm (1.9-4.0)  LV FS (2D):      9.8 %   (>25%)  LV EF (2D):     21.6 %   (>50%)                                    Right Ventricle:                                    RVd (2D):        8.92 cm  LV DIASTOLIC FUNCTION:  MV Peak E: 0.76 m/s E/e' Ratio: 14.60  MV Peak A: 0.41 m/s Decel Time: 45 msec  E/A Ratio: 1.84  SPECTRAL DOPPLER ANALYSIS (where applicable):  Mitral Valve:  MV P1/2 Time: 13.05 msec  MV Area, PHT: 16.86 cm??  Aortic Valve: AoV Max Vel: 0.65 m/s AoV Peak PG: 1.7 mmHg AoV Mean PG:  LVOT Vmax: 0.45 m/s LVOT VTI:  LVOT Diameter: 2.10 cm  AoV Area, Vmax: 2.41 cm?? AoV Area, VTI:  AoV Area, Vmn:  Tricuspid Valve and PA/RV Systolic Pressure: TR Max Velocity: 2.75 m/s RA   Pressure: 5 mmHg RVSP/PASP: 35.2 mmHg  Pulmonic Valve:  PV Max Velocity: 0.75 m/s PV Max  PG: 2.3 mmHg PV Mean PG:    PHYSICIAN INTERPRETATION:  Left Ventricle: The left ventricular  internal cavity size was normal. LV     posterior wall thickness was normal. Global LV systolic function was   severely decreased. Left ventricular ejection fraction, by visual   estimation, is 20 to 25%.  Right Ventricle: The right ventricular size is moderately enlarged.   Global RV systolic function is moderately reduced.  Pericardium: Trivial pericardial effusion is present.  Tricuspid Valve: The tricuspid valve is normal. Moderate tricuspid   regurgitation is visualized. The tricuspid regurgitant velocity is 2.75   m/s, and with an assumed right atrial pressure of 5 mmHg, the estimated   right ventricular systolic pressure is normal at 35.2 mmHg.  Aortic Valve: Mild to moderate aortic valve sclerosis/calcification is   present, without any evidence of aortic stenosis.  Pulmonic Valve: The pulmonic valve is normal. Mild pulmonic valve   regurgitation.  [id number] Ida Rogue MD  Electronically signed by [id number] Ida Rogue MD  Signature Date/Time: 08/25/2014/8:20:49 AM    *** Final ***    IMPRESSION: .        Verified By: Minna Merritts, M.D., MD    No Known Allergies:   Vital Signs/Nurse's Notes: **Vital Signs.:   29-Mar-16 08:16  Vital Signs Type Routine  Temperature Temperature (F) 97.5  Celsius 36.3  Pulse Pulse 146  Respirations Respirations 20  Systolic BP Systolic BP 427  Diastolic BP (mmHg) Diastolic BP (mmHg) 86  Mean BP 97  Pulse Ox % Pulse Ox % 97  Pulse Ox Activity Level  At rest  Oxygen Delivery Room Air/ 21 %    Impression 54 year old male with history of pulmonary fibrosis, rheumatoid arthritis, HTN who presented to [facility] on 3/28 with a 3 week history of palpitations and increased dyspnea and was found to be in new onset atrial flutter 2:1 conduction.  1. New onset atrial flutter, 2:1 conduction   HR 150: likely started 3 weeks ago or more.  -Add  diltiazem 30 mg q 6 hours today, possibly increase to 60 mg q 6 hours 3/30 if HR tolerates -Add IV digoxin 0.5 mg loading dose -Continue Lopressor 25 mg bid -Start Xarelto 20 mg q supper -Hold enalapril for added BP room for titration of the above medications -Hold adding amiodarone at this time as he certainly may have been in this rhythm for an extended time leading to the possibility of the formation of atrial clots -If medical therapy is unsuccessful at rate controlling him and he is still symptomatic may need to pursue TEE/DCCV prior to discharge, otherwise if he achieves adequate rate control with good symptom control could continue medical treatment for 3 weeks with daily anticoagulation, DCCV, followed by 4 weeks of continued anticoagulation -Replete K+ to 4.0 -Mag is pending -TSH ok -Hold IV fluids -Uncertain is ABX play a role currently as he has already completed a course of Levaquin   2. Acute on chronic systolic CHF: -Possibly tachymediated vs less likely ischemic (though will need ischemia workup in near future) -Achieve rate control and recheck echo to evaluate for improvement in LV function -Could also plan for nuclear stress testing as outpatient vs cardiac cath if indicated based on repeat echo -Continue rate controlling medications -Restart ACEi when HR is better controlled and BP allow (prior to discharge if able)  3. Elevated troponin: -No prior ischemic evaluations -Mildly elevated, likely 2/2 demand ischemia in the setting of tachycardia -Rate control -No ischemic work up plan at this time  4. Acclerated HTN: -Continue current medications as above  5.  Pulmonary fibrosis stable   Electronic Signatures: Christell Faith M (PA-C)  (Signed 29-Mar-16 11:09)  Authored: General Aspect/Present Illness, History and Physical Exam, Review of System, Family & Social History, Past Medical History, Home Medications, Labs, EKG , Radiology, Allergies, Vital Signs/Nurse's Notes,  Impression/Plan Ida Rogue (MD)  (Signed 29-Mar-16 13:19)  Authored: General Aspect/Present Illness, History and Physical Exam, Review of System, Family & Social History, Labs, EKG , Impression/Plan  Co-Signer: General Aspect/Present Illness, Home Medications, Allergies   Last Updated: 29-Mar-16 13:19 by Ida Rogue (MD)

## 2014-09-27 NOTE — Discharge Summary (Signed)
PATIENT NAME:  [name], [name] MR#:  [id number] DATE OF BIRTH:  03/08/1961  DATE OF ADMISSION:  08/24/2014 DATE OF DISCHARGE:  08/28/2014  For a detailed note, please take a look at the history and physical done on admission by Dr. Valentino Nose.   DISCHARGE DIAGNOSES:  1.  Atrial flutter with rapid ventricular response, now resolved. 2.  Cardiomyopathy with ejection fraction of 25%. 3.  History of pulmonary fibrosis, hypertension and rheumatoid arthritis.   DISCHARGE DIET: The patient is being discharged on a low-sodium diet.   DISCHARGE ACTIVITY: As tolerated.   DISCHARGE INSTRUCTIONS: Follow up with Dr. Kathlyn Sacramento in the next 1 to 2 weeks. Also follow up with Dr. Otilio Miu in the next 1 to 2 weeks.   DISCHARGE MEDICATIONS: Hydrochlorothiazide 25 mg daily, Allegra 180 mg daily, Tylenol 500 two tabs q. 6 hours as needed, enalapril 20 mg daily, Plaquenil 200 mg b.i.d., prednisone 5 mg daily, Advair 250/50 one puff b.i.d., Xarelto 20 mg daily, metoprolol tartrate 50 mg b.i.d.   CONSULTANTS DURING HOSPITAL COURSE: Dr. Fletcher Anon from cardiology.   PERTINENT STUDIES DURING HOSPITAL COURSE: Chest x-ray done on admission showed bilateral lung opacities similar to prior study, superimposed pneumonia, atelectasis possible. No pulmonary edema.   A 2-dimensional echocardiogram done on March 29th showed ejection fraction of 20% to 25%, severely decreased global left systolic function, global hypokinesis, moderate tricuspid regurgitation, mild to moderate aortic valve sclerosis and calcification.   BRIEF HOSPITAL COURSE: This is a 54 year old male with medical problems as mentioned above who presented to the hospital on August 24, 2014 due to shortness of breath, palpitations, and noted to be in SVT.  1.  SVT/atrial flutter. The patient presented with palpitations. Therefore, he was symptomatic secondary to his SVT. He was admitted to the hospital, started on pulse doses of IV Cardizem, also started  on oral rate controlling meds including metoprolol and digoxin. Despite getting those meds, the patient's rates remained uncontrolled. Cardiology consult was obtained. The patient was started on long-term anticoagulation with Xarelto. Since his heart rate was difficult to control he underwent electrical cardioversion on the morning of August 28, 2014 and converted to a sinus rhythm. The patient was discharged on oral metoprolol for rate control along with Xarelto for anticoagulation with close follow up with cardiology.  2.  Cardiomyopathy, ejection fraction of 20% to 25%. This was thought to be secondary to tachycardia from his uncontrolled atrial flutter. He did have mild troponin elevation, but cardiology did not think this was ischemic. At this point, the plan is to control his rate and convert him to a sinus rhythm and repeat his echocardiogram in the next 3 months. Clinically, he was not in congestive heart failure.  3.  Elevated troponin. This was likely in the setting of demand ischemia from the tachycardia. He had no evidence of acute coronary syndrome.  4.  Rheumatoid arthritis. The patient was maintained on his prednisone and Plaquenil. He will resume that. 5.  History of pulmonary fibrosis. The patient follows with Dr. Raul Del. He is currently not on oxygen at home. Further follow up is as per pulmonology as an outpatient.  6.  Hypertension. The patient's blood pressure remains stable after he was converted to a sinus rhythm and he will continue his metoprolol, enalapril and HCTZ upon discharge.   CODE STATUS: The patient is a FULL code.   TIME SPENT ON DISCHARGE: 40 minutes.  ____________________________ Belia Heman. Verdell Carmine, MD vjs:sb D: 08/28/2014 15:47:26 ET T: 08/28/2014  16:15:02 ET JOB#: [id number]  cc: Belia Heman. Verdell Carmine, MD, <Dictator> Muhammad A. Fletcher Anon, MD Juline Patch, MD Henreitta Leber MD ELECTRONICALLY SIGNED 09/03/2014 16:22

## 2014-10-05 ENCOUNTER — Other Ambulatory Visit: Payer: 59

## 2014-10-05 ENCOUNTER — Ambulatory Visit (INDEPENDENT_AMBULATORY_CARE_PROVIDER_SITE_OTHER): Payer: 59

## 2014-10-05 ENCOUNTER — Other Ambulatory Visit: Payer: Self-pay

## 2014-10-05 DIAGNOSIS — I429 Cardiomyopathy, unspecified: Secondary | ICD-10-CM | POA: Diagnosis not present

## 2014-10-09 ENCOUNTER — Encounter: Payer: Self-pay | Admitting: Physician Assistant

## 2014-11-20 ENCOUNTER — Encounter: Payer: Self-pay | Admitting: *Deleted

## 2014-11-20 ENCOUNTER — Ambulatory Visit: Payer: 59 | Admitting: Cardiovascular Disease

## 2014-12-01 ENCOUNTER — Emergency Department: Payer: 59

## 2014-12-01 ENCOUNTER — Inpatient Hospital Stay
Admission: EM | Admit: 2014-12-01 | Discharge: 2014-12-03 | DRG: 281 | Disposition: A | Discharge status: Home / Self Care | Payer: 59 | Attending: Specialist | Admitting: Specialist

## 2014-12-01 ENCOUNTER — Telehealth: Payer: Self-pay | Admitting: *Deleted

## 2014-12-01 ENCOUNTER — Encounter: Payer: Self-pay | Admitting: Emergency Medicine

## 2014-12-01 DIAGNOSIS — I214 Non-ST elevation (NSTEMI) myocardial infarction: Secondary | ICD-10-CM | POA: Diagnosis present

## 2014-12-01 DIAGNOSIS — I200 Unstable angina: Secondary | ICD-10-CM

## 2014-12-01 DIAGNOSIS — I484 Atypical atrial flutter: Secondary | ICD-10-CM | POA: Diagnosis present

## 2014-12-01 DIAGNOSIS — J8410 Pulmonary fibrosis, unspecified: Secondary | ICD-10-CM | POA: Diagnosis present

## 2014-12-01 DIAGNOSIS — M069 Rheumatoid arthritis, unspecified: Secondary | ICD-10-CM | POA: Diagnosis present

## 2014-12-01 DIAGNOSIS — Z87891 Personal history of nicotine dependence: Secondary | ICD-10-CM | POA: Diagnosis not present

## 2014-12-01 DIAGNOSIS — I483 Typical atrial flutter: Secondary | ICD-10-CM | POA: Diagnosis not present

## 2014-12-01 DIAGNOSIS — I10 Essential (primary) hypertension: Secondary | ICD-10-CM | POA: Diagnosis present

## 2014-12-01 DIAGNOSIS — R079 Chest pain, unspecified: Secondary | ICD-10-CM | POA: Diagnosis present

## 2014-12-01 DIAGNOSIS — I429 Cardiomyopathy, unspecified: Secondary | ICD-10-CM | POA: Diagnosis present

## 2014-12-01 HISTORY — DX: Encounter for observation for other suspected diseases and conditions ruled out: Z03.89

## 2014-12-01 HISTORY — DX: Reserved for inherently not codable concepts without codable children: IMO0001

## 2014-12-01 LAB — CBC
HCT: 43.4 % (ref 40.0–52.0)
Hemoglobin: 14 g/dL (ref 13.0–18.0)
MCH: 27.1 pg (ref 26.0–34.0)
MCHC: 32.1 g/dL (ref 32.0–36.0)
MCV: 84.4 fL (ref 80.0–100.0)
Platelets: 220 10*3/uL (ref 150–440)
RBC: 5.15 MIL/uL (ref 4.40–5.90)
RDW: 15.4 % — AB (ref 11.5–14.5)
WBC: 11.3 10*3/uL — ABNORMAL HIGH (ref 3.8–10.6)

## 2014-12-01 LAB — BASIC METABOLIC PANEL
Anion gap: 11 (ref 5–15)
BUN: 22 mg/dL — AB (ref 6–20)
CALCIUM: 9.2 mg/dL (ref 8.9–10.3)
CO2: 28 mmol/L (ref 22–32)
CREATININE: 1.02 mg/dL (ref 0.61–1.24)
Chloride: 101 mmol/L (ref 101–111)
GFR calc Af Amer: >60 mL/min (ref >60)
GLUCOSE: 102 mg/dL — AB (ref 65–99)
Potassium: 3.1 mmol/L — ABNORMAL LOW (ref 3.5–5.1)
Sodium: 140 mmol/L (ref 135–145)

## 2014-12-01 LAB — HEPARIN LEVEL (UNFRACTIONATED)

## 2014-12-01 LAB — TROPONIN I
TROPONIN I: 0.05 ng/mL — AB (ref <0.031)
TROPONIN I: 0.06 ng/mL — AB (ref <0.031)
Troponin I: 0.08 ng/mL — ABNORMAL HIGH (ref <0.031)
Troponin I: 0.07 ng/mL — ABNORMAL HIGH (ref <0.031)

## 2014-12-01 LAB — MAGNESIUM: Magnesium: 1.7 mg/dL (ref 1.7–2.4)

## 2014-12-01 LAB — PROTIME-INR
INR: 1.63
Prothrombin Time: 19.5 seconds — ABNORMAL HIGH (ref 11.4–15.0)

## 2014-12-01 LAB — APTT: APTT: 39 s — AB (ref 24–36)

## 2014-12-01 MED ORDER — SODIUM CHLORIDE 0.9 % IJ SOLN
3.0000 mL | Freq: Two times a day (BID) | INTRAMUSCULAR | Status: DC
  Administered 2014-12-01: 3 mL via INTRAVENOUS

## 2014-12-01 MED ORDER — ACETAMINOPHEN 325 MG PO TABS
650.0000 mg | ORAL_TABLET | Freq: Four times a day (QID) | ORAL | Status: DC | PRN
  Administered 2014-12-01: 650 mg via ORAL
  Filled 2014-12-01: qty 2

## 2014-12-01 MED ORDER — NITROGLYCERIN 2 % TD OINT
TOPICAL_OINTMENT | TRANSDERMAL | Status: AC
  Administered 2014-12-01: 1 [in_us]
  Filled 2014-12-01: qty 1

## 2014-12-01 MED ORDER — ONDANSETRON HCL 4 MG/2ML IJ SOLN: 4.0000 mg | Freq: Four times a day (QID) | INTRAMUSCULAR | Status: DC | PRN

## 2014-12-01 MED ORDER — HEPARIN (PORCINE) IN NACL 100-0.45 UNIT/ML-% IJ SOLN
1400.0000 [IU]/h | INTRAMUSCULAR | Status: DC
  Administered 2014-12-02: 1300 [IU]/h via INTRAVENOUS
  Filled 2014-12-01 (×5): qty 250

## 2014-12-01 MED ORDER — ACETAMINOPHEN 650 MG RE SUPP: 650.0000 mg | Freq: Four times a day (QID) | RECTAL | Status: DC | PRN

## 2014-12-01 MED ORDER — ENALAPRIL MALEATE 5 MG PO TABS
20.0000 mg | ORAL_TABLET | Freq: Every day | ORAL | Status: DC
  Administered 2014-12-01 – 2014-12-03 (×3): 20 mg via ORAL
  Filled 2014-12-01 (×3): qty 4

## 2014-12-01 MED ORDER — METOPROLOL TARTRATE 50 MG PO TABS
75.0000 mg | ORAL_TABLET | Freq: Two times a day (BID) | ORAL | Status: DC
  Administered 2014-12-01 – 2014-12-03 (×4): 75 mg via ORAL
  Filled 2014-12-01 (×4): qty 1

## 2014-12-01 MED ORDER — NITROGLYCERIN 0.4 MG SL SUBL
0.4000 mg | SUBLINGUAL_TABLET | Freq: Once | SUBLINGUAL | Status: AC
  Administered 2014-12-01: 0.4 mg via SUBLINGUAL

## 2014-12-01 MED ORDER — NITROGLYCERIN 0.4 MG SL SUBL: 0.4000 mg | SUBLINGUAL_TABLET | SUBLINGUAL | Status: DC | PRN

## 2014-12-01 MED ORDER — ONDANSETRON HCL 4 MG PO TABS: 4.0000 mg | ORAL_TABLET | Freq: Four times a day (QID) | ORAL | Status: DC | PRN

## 2014-12-01 MED ORDER — POTASSIUM CHLORIDE CRYS ER 20 MEQ PO TBCR
40.0000 meq | EXTENDED_RELEASE_TABLET | Freq: Two times a day (BID) | ORAL | Status: AC
  Administered 2014-12-01 – 2014-12-02 (×2): 40 meq via ORAL
  Filled 2014-12-01 (×2): qty 2

## 2014-12-01 MED ORDER — HEPARIN (PORCINE) IN NACL 100-0.45 UNIT/ML-% IJ SOLN
1300.0000 [IU]/h | INTRAMUSCULAR | Status: DC
  Administered 2014-12-01: 1300 [IU]/h via INTRAVENOUS
  Filled 2014-12-01 (×2): qty 250

## 2014-12-01 MED ORDER — NITROGLYCERIN 0.4 MG SL SUBL
SUBLINGUAL_TABLET | SUBLINGUAL | Status: AC
  Administered 2014-12-01: 0.4 mg via SUBLINGUAL
  Filled 2014-12-01: qty 1

## 2014-12-01 MED ORDER — MORPHINE SULFATE 2 MG/ML IJ SOLN
INTRAMUSCULAR | Status: AC
  Administered 2014-12-01: 2 mg via INTRAVENOUS
  Filled 2014-12-01: qty 1

## 2014-12-01 MED ORDER — MORPHINE SULFATE 2 MG/ML IJ SOLN
INTRAMUSCULAR | Status: AC
  Administered 2014-12-01: 2 mg via INTRAMUSCULAR
  Filled 2014-12-01: qty 1

## 2014-12-01 MED ORDER — ASPIRIN 81 MG PO CHEW: 324.0000 mg | CHEWABLE_TABLET | Freq: Once | ORAL | Status: DC

## 2014-12-01 MED ORDER — MORPHINE SULFATE 2 MG/ML IJ SOLN: 2.0000 mg | INTRAMUSCULAR | Status: DC | PRN

## 2014-12-01 MED ORDER — PREDNISONE 10 MG PO TABS
5.0000 mg | ORAL_TABLET | Freq: Every day | ORAL | Status: DC
  Administered 2014-12-01 – 2014-12-02 (×2): 5 mg via ORAL
  Filled 2014-12-01 (×2): qty 1
  Filled 2014-12-01: qty 2

## 2014-12-01 MED ORDER — HYDROCHLOROTHIAZIDE 25 MG PO TABS
25.0000 mg | ORAL_TABLET | Freq: Every day | ORAL | Status: DC
  Administered 2014-12-01 – 2014-12-03 (×3): 25 mg via ORAL
  Filled 2014-12-01 (×3): qty 1

## 2014-12-01 NOTE — Consult Note (Addendum)
Cardiology Consultation Note  Patient ID: [name], MRN: [id number], DOB/AGE: [birth year] 54 y.o. Admit date: 12/01/2014   Date of Consult: 12/01/2014 Primary Physician: Otilio Miu, MD Primary Cardiologist: Dr. Rockey Situ, MD  Chief Complaint: Chest pain Reason for Consult: Unstable angina   HPI: 54 y.o. 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   He was recently admitted to [facility] in April of 2016 for 3 week history of increased dyspnea and palpitations and found to be in new onset atrial flutter. Troponin at that time showed 0.05-->0.05-->0.07. TTE showed EF 20-25%, global HK, possible bicuspid aortic valve. He underwent successful TEE/DCCV on 4/1. 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 He was continued on Xarelto 20 mg. In hospital follow up he was doing well and remained in NSR. He was scheduled for a nuclear stress test based on the above echo which showed no significant ischemia, GI uptake was noted on the study, no significant wall motion abnormality, EF 48%, no EKG changes concerning for ischemia, overall low risk scan. After he remained in NSR for at least 1 month he underwent a repeat TTE that showed improved EF of 55-60%, select images suggestive of hypokinesis of the inferior and posterior myocardium. LV diastolic parameters were normal. Left atrium was normal in size. RV function was normal. PASP was normal. He was advised further work up if he has symptoms.   On 7/1 while at work he suddenly developed onset of palpitations that led to increased SOB, especially with  ambulation causing patient to leave work early. Patient checked his pulse and found it to be in the 140's. He did not seek medical care at that time. Pulse went back into the 60's to 80's without intervention. He had an fairly uneventful day on 7/2. On 7/3 he began to develop some intermittent chest pains while at rest. No associated symptoms at that time. He did not think much of these pains at that time and did not seek medical care. The pains persisted into 7/4 intermittently. He did take some Tylenol Arthritis and Phentermine. While he was sleeping going into 7/5 he was woken up around 2 AM with left shoulder pain that radiated to his left chest. Pain was associated with some nausea and SOB. Pain was rated as 10/10. Nothing helped the pain. No associated diaphoresis, vomiting, palpitations, presyncope, or syncope. He presented to [facility] for further evaluation.   Upon his arrival to [facility] he was found to have an initial troponin of 0.08. EKG with NSR, 82 bpm, incomplete RBBB, left anterior fascicular block, TWI III. CXR showed chronic pulmonary fibrosis, mildly increased peripheral bilateral upper lobe opacity suspicious for acute infectious exacerbation. WBC 11.3. HGB 14.0. SCr 1.02. K+ 3.1. He last took his Xarelto this morning. He was started on a heaprin gtt and 1 inch of nitro paste. He has also received morphine, SL NTG, and ASA. He currently has 7/10 chest pain with no associated symptoms.     Past Medical History  Diagnosis Date  . Atrial flutter     a. on Xarelto; b. s/p successful TEE/DCCV on 08/28/2014  . Pulmonary fibrosis  a. not on home oxygen  . RA (rheumatoid arthritis)   . HTN (hypertension)   . Tachycardia-induced cardiomyopathy     a. echo 07/2014: EF 20-25%, cannot exclude atrial flutter,  global HK, possible bicuspid Ao valve, mod reduced RV sys fxn, mild-mod aortic valve sclerosis/calcification, mod TR, mildly elevated RVSP; b. TEE 08/28/2014: EF 30-35%, no intracardic thrombus,  mildly dilated LA/RA, mild TR, mild-mod aortic sclerosis w/o stenosis; c. echo 09/2014: EF 55-60%, select images w/ inf HK  . History of nuclear stress test     a. 09/02/2014: no sig ischemia, GI uptake noted, no sig WMA, EF 48%, no EKG chanes concerning for ischemia, low risk scan        Most Recent Cardiac Studies: Echo 09/2014:  EF 55-60%, select images suggestive of hypokinesis of the inferior and posterior myocardium. LV diastolic function parameters normal. Left atrium normal in size. RV systolic function was normal. PASP normal.   Lexiscan Myoview 08/2014:  No significant ischemia. GI uptake noted on the study. No significant wall motion abnormality. EF 48%. No EKG changes concerning for ischemia. Overall, low risk scan.    TEE 08/2014:  EF 30-35%, mild bi-atrial enlargement, global RV systolic function mildly reduced, mild to moderate aortic valve sclerosis/calcification without any evidence of aortic stenosis, mild TR, no intracardiac thrombus    Surgical History: No past surgical history on file.   Home Meds: Prior to Admission medications   Medication Sig Start Date End Date Taking? Authorizing Provider  enalapril (VASOTEC) 20 MG tablet Take 20 mg by mouth daily.   Yes Historical Provider, MD  hydrochlorothiazide (HYDRODIURIL) 25 MG tablet Take 25 mg by mouth daily.   Yes Historical Provider, MD  metoprolol (LOPRESSOR) 50 MG tablet Take 1.5 tablets (75 mg total) by mouth 2 (two) times daily. 08/31/14  Yes Ryan M Dunn, PA-C  predniSONE (DELTASONE) 5 MG tablet Take 5 mg by mouth daily.   Yes Historical Provider, MD  rivaroxaban (XARELTO) 20 MG TABS tablet Take 20 mg by mouth every morning.    Yes Historical Provider, MD    Inpatient Medications:    . heparin      Allergies: No Known Allergies  History   Social History  . Marital Status: Married    Spouse Name: N/A  . Number of Children: N/A  . Years of Education: N/A   Occupational History  . Not on file.   Social  History Main Topics  . Smoking status: Former Smoker -- 1.00 packs/day for 20 years    Types: Cigarettes    Quit date: 08/30/2004  . Smokeless tobacco: Not on file  . Alcohol Use: No  . Drug Use: No  . Sexual Activity: Not on file   Other Topics Concern  . Not on file   Social History Narrative     Family History  Problem Relation Age of Onset  . Leukemia Mother      Review of Systems: Review of Systems  Constitutional: Positive for malaise/fatigue. Negative for fever, chills, weight loss and diaphoresis.  HENT: Negative for congestion.   Eyes: Negative for blurred vision, discharge and redness.  Respiratory: Positive for shortness of breath. Negative for cough, hemoptysis, sputum production and wheezing.   Cardiovascular: Positive for chest pain and palpitations. Negative for orthopnea, claudication, leg swelling and PND.       Palpitations on 7/2  Gastrointestinal: Positive for nausea. Negative for heartburn, vomiting, abdominal pain, diarrhea, blood in stool and melena.  Genitourinary: Negative for hematuria.  Musculoskeletal: Negative for myalgias and falls.  Skin: Negative for rash.  Neurological: Positive for weakness and headaches. Negative for dizziness, sensory change, speech change and focal weakness.  Endo/Heme/Allergies: Does not bruise/bleed easily.  Psychiatric/Behavioral: Negative for substance abuse. The patient is not nervous/anxious.   All other systems reviewed and are negative.    Labs:  Recent Labs  12/01/14 0958  TROPONINI 0.08*   Lab Results  Component Value Date   WBC 11.3* 12/01/2014   HGB 14.0 12/01/2014   HCT 43.4 12/01/2014   MCV 84.4 12/01/2014   PLT 220 12/01/2014    Recent Labs Lab 12/01/14 0958  NA 140  K 3.1*  CL 101  CO2 28  BUN 22*  CREATININE 1.02  CALCIUM 9.2  GLUCOSE 102*   No results found for: CHOL, HDL, LDLCALC, TRIG No results found for: DDIMER  Radiology/Studies:  Dg Chest 2 View  12/01/2014   CLINICAL  DATA:  54 year old male with central chest pain and shortness of breath. Initial encounter. Chronic pulmonary fibrosis / UIP.  EXAM: CHEST  2 VIEW  COMPARISON:  Chest radiographs 08/24/2014 and earlier.  FINDINGS: Stable lung volumes. Basilar fibrosis/honeycombing re- demonstrated. Stable cardiomegaly and mediastinal contours. No superimposed pneumothorax, pulmonary edema, or pleural effusion. Mildly increased patchy peripheral bilateral upper lobe opacity. Visualized tracheal air column is within normal limits. No acute osseous abnormality identified.  IMPRESSION: Chronic pulmonary fibrosis and cardiomegaly. Mildly increased peripheral bilateral upper lobe opacity suspicious for acute infectious exacerbation. No pleural effusion.   Electronically Signed   By: Genevie Ann M.D.   On: 12/01/2014 10:29    EKG: NSR, 82 bpm, occasional junctional escape beat, incomplete RBBB, left anterior fascicular block, LVH, TWI III  Weights: Filed Weights   12/01/14 0930  Weight: 215 lb (97.523 kg)     Physical Exam: Blood pressure 99/78, pulse 88, temperature 98.2 F (36.8 C), temperature source Oral, resp. rate 18, height 5\' 10"  (1.778 m), weight 215 lb (97.523 kg), SpO2 100 %. Body mass index is 30.85 kg/(m^2). General: Well developed, well nourished, in no acute distress. Head: Normocephalic, atraumatic, sclera non-icteric, no xanthomas, nares are without discharge.  Neck: Negative for carotid bruits. JVD not elevated. Lungs: Clear bilaterally to auscultation without wheezes, rales, or rhonchi. Breathing is unlabored. Heart: RRR with S1 S2. No murmurs, rubs, or gallops appreciated. Abdomen: Soft, non-tender, non-distended with normoactive bowel sounds. No hepatomegaly. No rebound/guarding. No obvious abdominal masses. Msk:  Strength and tone appear normal for age. Extremities: No clubbing or cyanosis. No edema.  Distal pedal pulses are 2+ and equal bilaterally. Neuro: Alert and oriented X 3. No facial  asymmetry. No focal deficit. Moves all extremities spontaneously. Psych:  Responds to questions appropriately with a normal affect.    Assessment and Plan:  54 y.o. 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   1. Unstable angina/chest pain: -Patient with recent negative nuclear stress test 08/2014 as part of his work up for his tachycardia-induced cardiomyopathy which improved with treatment and medication to normal LV function   -Initial elevated troponin of 0.08, await remaining 2. If troponin plateaus this could argue against significant coronary pathology in the setting of his  recent tachycardic episode on 7/2. However, if troponin continues to trend upwards would need cardiac catheterization. Patient may also consider diagnostic cardiac catheterization given his symptoms and PMH -Should cardiac cath be needed this will need to be delayed for 24-48 hours, preferably 48 hours from his last dose of Xarelto which was earlier this morning (7/5) -Given he is still with chest pain would keep inpatient and monitor -Other etiologies of his symptoms include pulmonary given CXR reading of possible acute infectious process vs MSK as he lifts 50-100 pound objects daily at his job  -For now, continue heparin gtt until troponin trends out -Hold Xarelto -Continue Lopressor -Hold enalapril for possible cath  -Nitro paste 1 inch  -Work to get chest pain free  2. History of atrial flutter s/p successful DCCV 08/2014: -He remains in NSR -Tachycardic episode 7/2 into the 140's that self resolved without medical intervention, unable to definitively state what rhythm  -Xarelto on  hold as above -Heparin gtt -Continue Lopressor 75 mg bid  3. HTN: -Continue Lopressor and HCTZ -Hold enalapril for possible cardiac cath -May need to supplement with added antihypertensive   4. Pulmonary fibrosis: -CXR with possible acute infectious process -Per IM  5. RA: -On maintenance prednisone     Signed, Christell Faith, PA-C Pager: [phone] 12/01/2014, 12:58 PM   Attending Note Patient seen and examined, agree with detailed note above,  Patient presentation and plan discussed on rounds.   Patient with continued chest pain today, even into this evening Xarelto held (heparin not started yet). He would like further workup of his sx, cardiac cath No clear reason for his pain, cardiac enz neg, baseline ABN EKG Prior echo with wall motion abn After discussing with the patient, he would prefer cardiac cath as he continues to have chest pain --Will talk with Dr. Ellyn Hack to see if he might feel comfortable doing the procedure tomorrow vs waiting until Thursday (ideally would like to be off xarelto 48 hours) --would replete potassium (unclear if this may be contributing to sx (MSK) Likely from HCTZ with potassium daily (would add 10 meq daily as an outpt)   Signed: Esmond Plants  M.D., Ph.D.

## 2014-12-01 NOTE — Progress Notes (Signed)
ANTICOAGULATION CONSULT NOTE - Initial Consult  Pharmacy Consult for Heparin  Indication: chest pain/ACS  No Known Allergies  Patient Measurements: Height: 5\' 10"  (177.8 cm) Weight: 215 lb (97.523 kg) IBW/kg (Calculated) : 73 Heparin Dosing Weight: 93.1 kg  Vital Signs: Temp: 98.2 F (36.8 C) (07/05 1423) Temp Source: Oral (07/05 1423) BP: 143/89 mmHg (07/05 1423) Pulse Rate: 86 (07/05 1423)  Labs:  Recent Labs  12/01/14 0958  HGB 14.0  HCT 43.4  PLT 220  APTT 39*  LABPROT 19.5*  INR 1.63  HEPARINUNFRC >3.60*  CREATININE 1.02  TROPONINI 0.08*    Estimated Creatinine Clearance: 95.8 mL/min (by C-G formula based on Cr of 1.02).   Medical History: Past Medical History  Diagnosis Date  . Atrial flutter     a. on Xarelto; b. s/p successful TEE/DCCV on 08/28/2014  . Pulmonary fibrosis     a. not on home oxygen  . RA (rheumatoid arthritis)   . HTN (hypertension)   . Tachycardia-induced cardiomyopathy     a. echo 07/2014: EF 20-25%, cannot exclude atrial flutter,  global HK, possible bicuspid Ao valve, mod reduced RV sys fxn, mild-mod aortic valve sclerosis/calcification, mod TR, mildly elevated RVSP; b. TEE 08/28/2014: EF 30-35%, no intracardic thrombus, mildly dilated LA/RA, mild TR, mild-mod aortic sclerosis w/o stenosis; c. echo 09/2014: EF 55-60%, select images w/ inf HK  . History of nuclear stress test     a. 09/02/2014: no sig ischemia, GI uptake noted, no sig WMA, EF 48%, no EKG chanes concerning for ischemia, low risk scan        Assessment: 54 yo male here with unstable angina/chest pain. Pt on Xarelto PTA. Per pt last dose taken this AM at 6AM. Initially had started heparin drip based on information from med reconciliation that stated that last dose was yesterday AM. Will hold heparin drip at this time and time heparin drip to start at 7/6 at 0600 (per protocol states to initiate heparin drip at least 24h after last rivaroxaban dose). Spoke with Christell Faith  (Cardiology) regarding delay in initiation of heparin drip until tomorrow AM at 6 am per protocol; stated he will pass this information off to Dr. Rockey Situ as well.   Goal of Therapy:  Heparin level 0.3-0.7 units/ml Monitor platelets by anticoagulation protocol: Yes   Plan:  Will start heparin drip at 1300 units/hr (=13 ml//hr) on 7/6 at 0600 (24h after last Xarelto dose). No bolus due to recent anticoagulation.  Will order aPTT, anti-Xa level and CBC for AM Will need to continue to monitor for s/sx of bleeding.   Rocky Morel 12/01/2014,2:46 PM

## 2014-12-01 NOTE — Telephone Encounter (Signed)
Spoke w/ pt.   He is calling back, stating that he needs an appt today to see Dr. Rockey Situ, as he is experiencing chest pain w/ HR in the 130s. Advised pt to either call 911 or have someone take him to the ED.  He is agreeable to this and will proceed now.

## 2014-12-01 NOTE — ED Notes (Signed)
Says awakened with left arm pain 2 am. Then left chst hurting with pressure.

## 2014-12-01 NOTE — ED Notes (Signed)
Patient transported to X-ray 

## 2014-12-01 NOTE — ED Provider Notes (Signed)
[facility] Emergency Department Provider Note  ____________________________________________  Time seen: On arrival  I have reviewed the triage vital signs and the nursing notes.   HISTORY  Chief Complaint Chest Pain    HPI [name] is a 54 y.o. male who presents with chest pain. He reports that he woke up at 2 AM with left shoulder discomfort and when he sat up it shifted to his chest. He reports a 5 out of 10 pressure-like discomfort in his chest that is worse when he takes a deep breath. He has a history of atrial flutter and pulmonary fibrosis but no history of coronary artery disease. He denies fevers chills. No nausea no vomiting. No diaphoresis. No cough. He took aspirin at home    Past Medical History  Diagnosis Date  . Atrial flutter     a. on Xarelto; b. s/p successful TEE/DCCV on 08/28/2014  . Pulmonary fibrosis     a. not on home oxygen  . RA (rheumatoid arthritis)   . HTN (hypertension)   . Tachycardia-induced cardiomyopathy     a. echo 07/2014: EF 20-25%, cannot exclude atrial flutter,  global HK, possible bicuspid Ao valve, mod reduced RV sys fxn, mild-mod aortic valve sclerosis/calcification, mod TR, mildly elevated RVSP; b. TEE 08/28/2014: EF 30-35%, no intracardic thrombus, mildly dilated LA/RA, mild TR, mild-mod aortic sclerosis w/o stenosis; c. echo 09/2014: EF 55-60%, select images w/ inf HK  . History of nuclear stress test     a. 09/02/2014: no sig ischemia, GI uptake noted, no sig WMA, EF 48%, no EKG chanes concerning for ischemia, low risk scan      Patient Active Problem List   Diagnosis Date Noted  . Tachycardia-induced cardiomyopathy   . HTN (hypertension)   . RA (rheumatoid arthritis)   . Pulmonary fibrosis   . Atrial flutter     No past surgical history on file.  Current Outpatient Rx  Name  Route  Sig  Dispense  Refill  . enalapril (VASOTEC) 20 MG tablet   Oral   Take 20 mg by mouth daily.         .  hydrochlorothiazide (HYDRODIURIL) 25 MG tablet   Oral   Take 25 mg by mouth daily.         . metoprolol (LOPRESSOR) 50 MG tablet   Oral   Take 1.5 tablets (75 mg total) by mouth 2 (two) times daily.   90 tablet   6   . predniSONE (DELTASONE) 5 MG tablet   Oral   Take 5 mg by mouth daily.         . rivaroxaban (XARELTO) 20 MG TABS tablet   Oral   Take 20 mg by mouth every morning.            Allergies Review of patient's allergies indicates no known allergies.  Family History  Problem Relation Age of Onset  . Family history unknown: Yes    Social History History  Substance Use Topics  . Smoking status: Former Smoker -- 1.00 packs/day for 20 years    Types: Cigarettes    Quit date: 08/30/2004  . Smokeless tobacco: Not on file  . Alcohol Use: No    Review of Systems  Constitutional: Negative for fever. Eyes: Negative for visual changes. ENT: Negative for sore throat Cardiovascular: Positive for chest pain Respiratory: Negative for shortness of breath. Gastrointestinal: Negative for abdominal pain, vomiting and diarrhea. Genitourinary: Negative for dysuria. Musculoskeletal: Negative for back pain. Skin:  Negative for rash. Neurological: Negative for headaches or focal weakness Psychiatric: No anxiety  10-point ROS otherwise negative.  ____________________________________________   PHYSICAL EXAM:  VITAL SIGNS: ED Triage Vitals  Enc Vitals Group     BP 12/01/14 0930 142/85 mmHg     Pulse Rate 12/01/14 0930 81     Resp 12/01/14 0930 16     Temp 12/01/14 0930 98.2 F (36.8 C)     Temp Source 12/01/14 0930 Oral     SpO2 --      Weight 12/01/14 0930 215 lb (97.523 kg)     Height 12/01/14 0930 5\' 10"  (1.778 m)     Head Cir --      Peak Flow --      Pain Score 12/01/14 0932 7     Pain Loc --      Pain Edu? --      Excl. in [place]? --      Constitutional: Alert and oriented. Well appearing and in no distress. Eyes: Conjunctivae are normal.   ENT   Head: Normocephalic and atraumatic.   Mouth/Throat: Mucous membranes are moist. Cardiovascular: Normal rate, regular rhythm. Normal and symmetric distal pulses are present in all extremities. No murmurs, rubs, or gallops. Respiratory: Normal respiratory effort without tachypnea nor retractions. Breath sounds are clear and equal bilaterally.  Gastrointestinal: Soft and non-tender in all quadrants. No distention. There is no CVA tenderness. Genitourinary: deferred Musculoskeletal: Nontender with normal range of motion in all extremities. No lower extremity tenderness nor edema. Neurologic:  Normal speech and language. No gross focal neurologic deficits are appreciated. Skin:  Skin is warm, dry and intact. No rash noted. Psychiatric: Mood and affect are normal. Patient exhibits appropriate insight and judgment.  ____________________________________________    LABS (pertinent positives/negatives)  Labs Reviewed  CBC - Abnormal; Notable for the following:    WBC 11.3 (*)    RDW 15.4 (*)    All other components within normal limits  BASIC METABOLIC PANEL - Abnormal; Notable for the following:    Potassium 3.1 (*)    Glucose, Bld 102 (*)    BUN 22 (*)    All other components within normal limits  TROPONIN I - Abnormal; Notable for the following:    Troponin I 0.08 (*)    All other components within normal limits    ____________________________________________   EKG  ED ECG REPORT I, Lavonia Drafts, the attending physician, personally viewed and interpreted this ECG.   Date: 12/01/2014  EKG Time: 9:32 AM  Rate: 2  Rhythm: normal sinus rhythm  Axis: Normal axis  Intervals:left anterior fascicular block  ST&T Change: Nonspecific   ____________________________________________    RADIOLOGY I have personally reviewed any xrays that were ordered on this patient:  Chest x-ray shows no acute  changes  ____________________________________________   PROCEDURES  Procedure(s) performed: none  Critical Care performed: yes CRITICAL CARE Performed by: Lavonia Drafts   Total critical care time: 30  Critical care time was exclusive of separately billable procedures and treating other patients.  Critical care was necessary to treat or prevent imminent or life-threatening deterioration.  Critical care was time spent personally by me on the following activities: development of treatment plan with patient and/or surrogate as well as nursing, discussions with consultants, evaluation of patient's response to treatment, examination of patient, obtaining history from patient or surrogate, ordering and performing treatments and interventions, ordering and review of laboratory studies, ordering and review of radiographic studies, pulse oximetry and re-evaluation of  patient's condition.   ____________________________________________   INITIAL IMPRESSION / ASSESSMENT AND PLAN / ED COURSE  Pertinent labs & imaging results that were available during my care of the patient were reviewed by me and considered in my medical decision making (see chart for details).  Patient with concerning story for ACS .We will give nitroglycerin glycerin for chest discomfort. He has already taken aspirin Check chest x-ray cardiac enzymes and reevaluate.  ----------------------------------------- 11:01 AM on 12/01/2014 -----------------------------------------  Notified of elevated troponin. Patient has relief of pain with nitroglycerin but comes back rapidly. We will apply nitroglycerin paste. I'll heparinize the patient given significant current concern for ACS and elevated troponin  ____________________________________________   FINAL CLINICAL IMPRESSION(S) / ED DIAGNOSES  Final diagnoses:  NSTEMI (non-ST elevated myocardial infarction)     Lavonia Drafts, MD 12/01/14 1102

## 2014-12-01 NOTE — H&P (Signed)
[place] [facility] [place] NAME: [name]    MR#:  [id number]  DATE OF BIRTH:  [birth year]  DATE OF ADMISSION:  12/01/2014  PRIMARY CARE PHYSICIAN: Otilio Miu, MD   REQUESTING/REFERRING PHYSICIAN: Dr. Lavonia Drafts  CHIEF COMPLAINT:   Chief Complaint  Patient presents with  . Chest Pain    HISTORY OF PRESENT ILLNESS:  [name]  is a 54 y.o. male with a known history of atrial flutter status post recent cardioversion, pulmonary fibrosis, rheumatoid arthritis, history of Raynaud, tachycardia-induced cardiomyopathy, who presents to the hospital with chest pain/pressure. Patient says this past Saturday when he was at work he developed some sudden dizziness and diaphoresis. He had to come home from work and he persisted bit on and his heart rate was in the 140s he rested and his heart rate came down. He then this morning at 2 AM woke up with chest heaviness and discomfort. He also had some pain in his left shoulder. He denied any nausea or vomiting palpitations, syncope or any other associated symptoms. Patient's troponin was noted to be mildly elevated at 0.08 and hospitalist services were contacted for further treatment and evaluation.  PAST MEDICAL HISTORY:   Past Medical History  Diagnosis Date  . Atrial flutter     a. on Xarelto; b. s/p successful TEE/DCCV on 08/28/2014  . Pulmonary fibrosis     a. not on home oxygen  . RA (rheumatoid arthritis)   . HTN (hypertension)   . Tachycardia-induced cardiomyopathy     a. echo 07/2014: EF 20-25%, cannot exclude atrial flutter,  global HK, possible bicuspid Ao valve, mod reduced RV sys fxn, mild-mod aortic valve sclerosis/calcification, mod TR, mildly elevated RVSP; b. TEE 08/28/2014: EF 30-35%, no intracardic thrombus, mildly dilated LA/RA, mild TR, mild-mod aortic sclerosis w/o stenosis; c. echo 09/2014: EF 55-60%, select images w/ inf HK  . History of nuclear stress test     a.  09/02/2014: no sig ischemia, GI uptake noted, no sig WMA, EF 48%, no EKG chanes concerning for ischemia, low risk scan      PAST SURGICAL HISTORY:  No past surgical history on file.  SOCIAL HISTORY:   History  Substance Use Topics  . Smoking status: Former Smoker -- 1.00 packs/day for 20 years    Types: Cigarettes    Quit date: 08/30/2004  . Smokeless tobacco: Not on file  . Alcohol Use: No    FAMILY HISTORY:   Family History  Problem Relation Age of Onset  . Leukemia Mother     DRUG ALLERGIES:  No Known Allergies  REVIEW OF SYSTEMS:   Review of Systems  Constitutional: Negative for fever and weight loss.  HENT: Negative for congestion, nosebleeds and tinnitus.   Eyes: Negative for blurred vision, double vision and redness.  Respiratory: Negative for cough, hemoptysis and shortness of breath.   Cardiovascular: Positive for chest pain. Negative for orthopnea, leg swelling and PND.  Gastrointestinal: Negative for nausea, vomiting, abdominal pain, diarrhea and melena.  Genitourinary: Negative for dysuria, urgency and hematuria.  Musculoskeletal: Negative for joint pain and falls.  Skin: Negative for rash.  Neurological: Positive for dizziness. Negative for tingling, sensory change, focal weakness, seizures, weakness and headaches.  Endo/Heme/Allergies: Negative for polydipsia. Does not bruise/bleed easily.  Psychiatric/Behavioral: Negative for depression and memory loss. The patient is not nervous/anxious.     MEDICATIONS AT HOME:   Prior to Admission medications   Medication Sig Start Date End Date  Taking? Authorizing Provider  enalapril (VASOTEC) 20 MG tablet Take 20 mg by mouth daily.   Yes Historical Provider, MD  hydrochlorothiazide (HYDRODIURIL) 25 MG tablet Take 25 mg by mouth daily.   Yes Historical Provider, MD  metoprolol (LOPRESSOR) 50 MG tablet Take 1.5 tablets (75 mg total) by mouth 2 (two) times daily. 08/31/14  Yes Ryan M Dunn, PA-C  predniSONE (DELTASONE) 5  MG tablet Take 5 mg by mouth daily.   Yes Historical Provider, MD  rivaroxaban (XARELTO) 20 MG TABS tablet Take 20 mg by mouth every morning.    Yes Historical Provider, MD      VITAL SIGNS:  Blood pressure 117/81, pulse 81, temperature 98.2 F (36.8 C), temperature source Oral, resp. rate 32, height 5\' 10"  (1.778 m), weight 97.523 kg (215 lb).  PHYSICAL EXAMINATION:  Physical Exam  GENERAL:  54 y.o.-year-old patient lying in the bed with no acute distress.  EYES: Pupils equal, round, reactive to light and accommodation. No scleral icterus. Extraocular muscles intact.  HEENT: Head atraumatic, normocephalic. Oropharynx and nasopharynx clear. No oropharyngeal erythema, moist oral mucosa  NECK:  Supple, no jugular venous distention. No thyroid enlargement, no tenderness.  LUNGS: Normal breath sounds bilaterally, no wheezing, rales, rhonchi. No use of accessory muscles of respiration.  CARDIOVASCULAR: S1, S2 RRR. No murmurs, rubs, gallops, clicks.  ABDOMEN: Soft, nontender, nondistended. Bowel sounds present. No organomegaly or mass.  EXTREMITIES: No pedal edema, cyanosis, or clubbing. + 2 pedal & radial pulses b/l.   NEUROLOGIC: Cranial nerves II through XII are intact. No focal Motor or sensory deficits appreciated b/l PSYCHIATRIC: The patient is alert and oriented x 3. Good affect.  SKIN: No obvious rash, lesion, or ulcer.   LABORATORY PANEL:   CBC  Recent Labs Lab 12/01/14 0958  WBC 11.3*  HGB 14.0  HCT 43.4  PLT 220   ------------------------------------------------------------------------------------------------------------------  Chemistries   Recent Labs Lab 12/01/14 0958  NA 140  K 3.1*  CL 101  CO2 28  GLUCOSE 102*  BUN 22*  CREATININE 1.02  CALCIUM 9.2   ------------------------------------------------------------------------------------------------------------------  Cardiac Enzymes  Recent Labs Lab 12/01/14 0958  TROPONINI 0.08*    ------------------------------------------------------------------------------------------------------------------  RADIOLOGY:  Dg Chest 2 View  12/01/2014   CLINICAL DATA:  54 year old male with central chest pain and shortness of breath. Initial encounter. Chronic pulmonary fibrosis / UIP.  EXAM: CHEST  2 VIEW  COMPARISON:  Chest radiographs 08/24/2014 and earlier.  FINDINGS: Stable lung volumes. Basilar fibrosis/honeycombing re- demonstrated. Stable cardiomegaly and mediastinal contours. No superimposed pneumothorax, pulmonary edema, or pleural effusion. Mildly increased patchy peripheral bilateral upper lobe opacity. Visualized tracheal air column is within normal limits. No acute osseous abnormality identified.  IMPRESSION: Chronic pulmonary fibrosis and cardiomegaly. Mildly increased peripheral bilateral upper lobe opacity suspicious for acute infectious exacerbation. No pleural effusion.   Electronically Signed   By: Genevie Ann M.D.   On: 12/01/2014 10:29     IMPRESSION AND PLAN:   54 year old male with past medical history of atrial flutter status post cardioversion, tachycardia-induced cardiomyopathy, rheumatoid arthritis, history of Raynaud's phenomenon, hypertension who presented to the hospital with chest pain/pressure.  #1 unstable angina/chest pain-this is likely clinical diagnosis given patient's symptoms and mildly elevated troponin. He does have risk factors and does give a good story for angina. -EKG does not show any acute ST-T wave changes. He did have a negative stress test done in April 2016. -For now we'll continue aspirin, beta blocker, heparin drip, nitroglycerin as needed, will start  a statin, and continue enalapril. -I will go ahead and get a cardiology consult. Patient is known to Dr. Rockey Situ.  #2 history of atrial flutter status post cardioversion-patient presently is in a normal sinus rhythm. -Continue metoprolol, hold Xarelto as patient is going to be on heparin  nomogram.  #3 hypertension-continue HCTZ, metoprolol, enalapril.  #4 history of rheumatoid arthritis-continue maintenance prednisone. -Patient is followed by Dr. Cristi Loron.      All the records are reviewed and case discussed with ED provider. Management plans discussed with the patient, family and they are in agreement.  CODE STATUS: Full  TOTAL TIME TAKING CARE OF THIS PATIENT: 45 minutes.    Henreitta Leber M.D on 12/01/2014 at 11:50 AM  Between 7am to 6pm - Pager - [phone]  After 6pm go to [email or web address] - password [email or web address] [facility]  [place] [facility]  Office  [phone]  CC: Primary care physician; Otilio Miu, MD

## 2014-12-01 NOTE — Telephone Encounter (Signed)
Pt states over the weekend he got a FitBit and his HR was around 130, but it would come down.  He would take a few steps and then get out of breath.  And this morning he his having chest pains. But has taken medication for it  He said is fine but "im not a doctor" so he is not sure if this should be taken seriously   Pt is coming tmrw at 7:40am to see Dr Rockey Situ.

## 2014-12-01 NOTE — ED Notes (Signed)
Pts troponin is elevated at 0.08 Dr. Corky Downs notified.

## 2014-12-01 NOTE — ED Notes (Signed)
Patient back from  X-ray 

## 2014-12-02 ENCOUNTER — Ambulatory Visit: Payer: 59 | Admitting: Cardiovascular Disease

## 2014-12-02 LAB — APTT
aPTT: 40 seconds — ABNORMAL HIGH (ref 24–36)
aPTT: 49 seconds — ABNORMAL HIGH (ref 24–36)
aPTT: 47 seconds — ABNORMAL HIGH (ref 24–36)

## 2014-12-02 LAB — BASIC METABOLIC PANEL
Anion gap: 6 (ref 5–15)
BUN: 17 mg/dL (ref 6–20)
CALCIUM: 9 mg/dL (ref 8.9–10.3)
CHLORIDE: 105 mmol/L (ref 101–111)
CO2: 28 mmol/L (ref 22–32)
CREATININE: 0.78 mg/dL (ref 0.61–1.24)
GFR calc Af Amer: >60 mL/min (ref >60)
GFR calc non Af Amer: >60 mL/min (ref >60)
GLUCOSE: 105 mg/dL — AB (ref 65–99)
Potassium: 3.6 mmol/L (ref 3.5–5.1)
Sodium: 139 mmol/L (ref 135–145)

## 2014-12-02 LAB — CBC
HEMATOCRIT: 39.7 % — AB (ref 40.0–52.0)
Hemoglobin: 13 g/dL (ref 13.0–18.0)
MCH: 27.4 pg (ref 26.0–34.0)
MCHC: 32.8 g/dL (ref 32.0–36.0)
MCV: 83.8 fL (ref 80.0–100.0)
Platelets: 192 10*3/uL (ref 150–440)
RBC: 4.74 MIL/uL (ref 4.40–5.90)
RDW: 14.9 % — AB (ref 11.5–14.5)
WBC: 8.5 10*3/uL (ref 3.8–10.6)

## 2014-12-02 LAB — HEPARIN LEVEL (UNFRACTIONATED)
HEPARIN UNFRACTIONATED: 2.54 [IU]/mL — AB (ref 0.30–0.70)
Heparin Unfractionated: 0.77 IU/mL — ABNORMAL HIGH (ref 0.30–0.70)

## 2014-12-02 MED ORDER — SODIUM CHLORIDE 0.9 % WEIGHT BASED INFUSION: 1.0000 mL/kg/h | INTRAVENOUS | Status: DC

## 2014-12-02 MED ORDER — ATORVASTATIN CALCIUM 20 MG PO TABS
40.0000 mg | ORAL_TABLET | Freq: Every day | ORAL | Status: DC
Start: 2014-12-02 — End: 2014-12-03
  Administered 2014-12-02: 40 mg via ORAL
  Filled 2014-12-02: qty 2

## 2014-12-02 MED ORDER — ASPIRIN 81 MG PO CHEW
81.0000 mg | CHEWABLE_TABLET | ORAL | Status: AC
  Administered 2014-12-03: 81 mg via ORAL
  Filled 2014-12-02: qty 1

## 2014-12-02 MED ORDER — SODIUM CHLORIDE 0.9 % IV SOLN: 250.0000 mL | INTRAVENOUS | Status: DC | PRN

## 2014-12-02 MED ORDER — SODIUM CHLORIDE 0.9 % IJ SOLN: 3.0000 mL | INTRAMUSCULAR | Status: DC | PRN

## 2014-12-02 MED ORDER — SODIUM CHLORIDE 0.9 % IJ SOLN
3.0000 mL | Freq: Two times a day (BID) | INTRAMUSCULAR | Status: DC
  Administered 2014-12-03: 3 mL via INTRAVENOUS

## 2014-12-02 MED ORDER — SODIUM CHLORIDE 0.9 % WEIGHT BASED INFUSION
3.0000 mL/kg/h | INTRAVENOUS | Status: AC
  Administered 2014-12-03: 3 mL/kg/h via INTRAVENOUS

## 2014-12-02 NOTE — Plan of Care (Signed)
Problem: Phase I Progression Outcomes Goal: MD aware of Cardiac Marker results Outcome: Completed/Met Date Met:  12/02/14 Pt to undergo cardiac cath 12/03/14, currently on heparin drip

## 2014-12-02 NOTE — Progress Notes (Signed)
Patient: [name] / Admit Date: 12/01/2014 / Date of Encounter: 12/02/2014, 9:25 AM   Subjective: No further chest pain. Troponin with flat trend 0.08-->0.05-->0.06-->0.07. On heparin gtt. Has been off Xarelto x 24 hours now (last dose AM of 7/5). Unable to place on cath board for today 2/2 no spots. Will place for first case 7/7.   Review of Systems: Review of Systems  Constitutional: Negative for fever, chills, weight loss, malaise/fatigue and diaphoresis.  HENT: Negative for congestion.   Eyes: Negative for discharge and redness.  Respiratory: Negative for cough, hemoptysis, sputum production, shortness of breath and wheezing.   Cardiovascular: Negative for chest pain, palpitations, orthopnea, leg swelling and PND.  Gastrointestinal: Negative for heartburn, nausea, vomiting, blood in stool and melena.  Genitourinary: Negative for hematuria.  Musculoskeletal: Negative for falls.  Skin: Negative for rash.  Neurological: Negative for speech change, focal weakness, weakness and headaches.  Endo/Heme/Allergies: Does not bruise/bleed easily.  Psychiatric/Behavioral: Negative for substance abuse. The patient is not nervous/anxious.   All other systems reviewed and are negative.    Objective: Telemetry: NSR, 70's to 80's Physical Exam: Blood pressure 111/64, pulse 75, temperature 98.4 F (36.9 C), temperature source Oral, resp. rate 20, height 5\' 10"  (1.778 m), weight 215 lb (97.523 kg), SpO2 99 %. Body mass index is 30.85 kg/(m^2). General: Well developed, well nourished, in no acute distress. Head: Normocephalic, atraumatic, sclera non-icteric, no xanthomas, nares are without discharge. Neck: Negative for carotid bruits. JVP not elevated. Lungs: Clear bilaterally to auscultation without wheezes, rales, or rhonchi. Breathing is unlabored. Heart: RRR S1 S2 without murmurs, rubs, or gallops.  Abdomen: Soft, non-tender, non-distended with normoactive bowel sounds. No  rebound/guarding. Extremities: No clubbing or cyanosis. No edema. Distal pedal pulses are 2+ and equal bilaterally. Neuro: Alert and oriented X 3. Moves all extremities spontaneously. Psych:  Responds to questions appropriately with a normal affect.   Intake/Output Summary (Last 24 hours) at 12/02/14 0925 Last data filed at 12/02/14 6195  Gross per 24 hour  Intake    243 ml  Output    375 ml  Net   -132 ml    Inpatient Medications:  . [START ON 12/03/2014] aspirin  81 mg Oral Pre-Cath  . enalapril  20 mg Oral Daily  . hydrochlorothiazide  25 mg Oral Daily  . metoprolol  75 mg Oral BID  . predniSONE  5 mg Oral Daily  . sodium chloride  3 mL Intravenous Q12H  . sodium chloride  3 mL Intravenous Q12H   Infusions:  . [START ON 12/03/2014] sodium chloride     Followed by  . [START ON 12/03/2014] sodium chloride    . heparin 1,300 Units/hr (12/02/14 0603)    Labs:  Recent Labs  12/01/14 0958 12/02/14 0436  NA 140 139  K 3.1* 3.6  CL 101 105  CO2 28 28  GLUCOSE 102* 105*  BUN 22* 17  CREATININE 1.02 0.78  CALCIUM 9.2 9.0  MG 1.7  --    No results for input(s): AST, ALT, ALKPHOS, BILITOT, PROT, ALBUMIN in the last 72 hours.  Recent Labs  12/01/14 0958 12/02/14 0436  WBC 11.3* 8.5  HGB 14.0 13.0  HCT 43.4 39.7*  MCV 84.4 83.8  PLT 220 192    Recent Labs  12/01/14 0958 12/01/14 1427 12/01/14 1804 12/01/14 2159  TROPONINI 0.08* 0.05* 0.06* 0.07*   Invalid input(s): POCBNP No results for input(s): HGBA1C in the last 72 hours.   Weights: [employer]  Weights   12/01/14 0930  Weight: 215 lb (97.523 kg)     Radiology/Studies:  Dg Chest 2 View  12/01/2014   CLINICAL DATA:  54 year old male with central chest pain and shortness of breath. Initial encounter. Chronic pulmonary fibrosis / UIP.  EXAM: CHEST  2 VIEW  COMPARISON:  Chest radiographs 08/24/2014 and earlier.  FINDINGS: Stable lung volumes. Basilar fibrosis/honeycombing re- demonstrated. Stable cardiomegaly and  mediastinal contours. No superimposed pneumothorax, pulmonary edema, or pleural effusion. Mildly increased patchy peripheral bilateral upper lobe opacity. Visualized tracheal air column is within normal limits. No acute osseous abnormality identified.  IMPRESSION: Chronic pulmonary fibrosis and cardiomegaly. Mildly increased peripheral bilateral upper lobe opacity suspicious for acute infectious exacerbation. No pleural effusion.   Electronically Signed   By: Genevie Ann M.D.   On: 12/01/2014 10:29     Assessment and Plan  54 y.o. male with h/o atrial flutter s/p recent TEE/DCCV in April 2016 on Xarelto, history of tachycardia-induced cardiomyopathy with EF as low as 30-35% now improved to 55-60%, pulmonary fibrosis not on home oxygen, HTN, and rheumatoid arthritis who presented [facility] 12/01/2014 with onset of left shoulder pain and chest pain that woke him up overnight, found to have unstable angina.   1. Unstable angina/chest pain: -Patient with recent negative nuclear stress test 08/2014 as part of his work up for his tachycardia-induced cardiomyopathy which improved with treatment and medication to normal LV function  -Troponin with flat trend 0.08-->0.05-->0.06-->0.07 -Plan for cardiac cath first case 7/7 -Elevated troponin possibly in the setting of atrial flutter that occurred on 7/2 vs ischemic event. Given flat trending troponin it may be less likely to have significant ischemia, though would need cardiac cath to fully determine  -Chest pain resolved -Other etiologies of his symptoms include pulmonary given CXR reading of possible acute infectious process vs MSK as he lifts 50-100 pound objects daily at his job vs tachycardic episode on 7/2   -Continue heparin gtt until troponin trends out -Hold Xarelto -Continue Lopressor -Hold enalapril for possible cath  -Nitro paste 1 inch  -Risks and benefits of cardiac catheterization have been discussed with the patient including risks of  bleeding, bruising, infection, kidney damage, stroke, heart attack, and death. The patient understands these risks and is willing to proceed with the procedure. All questions have been answered and concerns listened to.   2. History of atrial flutter s/p successful DCCV 08/2014: -He remains in NSR -Tachycardic episode 7/2 into the 140's that self resolved without medical intervention, unable to definitively state what rhythm  -Xarelto on hold as above -Heparin gtt -Continue Lopressor 75 mg bid  3. HTN: -Continue Lopressor and HCTZ -Hold enalapril for possible cardiac cath -May need to supplement with added antihypertensive   4. Pulmonary fibrosis: -CXR with possible acute infectious process -Per IM  5. RA: -On maintenance prednisone    Signed, Christell Faith, PA-C Pager: [phone] 12/02/2014, 9:25 AM

## 2014-12-02 NOTE — Progress Notes (Signed)
[place] [facility] [place] NAME: [name]    MR#:  [id number]  DATE OF BIRTH:  01-25-1961  SUBJECTIVE:  CHIEF COMPLAINT:   Chief Complaint  Patient presents with  . Chest Pain   Patient here with chest pain and symptoms of unstable angina. Currently chest pain-free and clinically doing well.  REVIEW OF SYSTEMS:    Review of Systems  Constitutional: Negative for fever.  HENT: Negative for congestion, nosebleeds and tinnitus.   Respiratory: Negative for cough, hemoptysis, shortness of breath and wheezing.   Cardiovascular: Negative for chest pain, orthopnea, leg swelling and PND.  Gastrointestinal: Negative for nausea, vomiting, abdominal pain, diarrhea and melena.  Genitourinary: Negative for dysuria.  Neurological: Negative for focal weakness, weakness and headaches.  All other systems reviewed and are negative.   Nutrition: Heart healthy Tolerating Diet: Yes Tolerating PT: Ambulatory   DRUG ALLERGIES:  No Known Allergies  VITALS:  Blood pressure 111/64, pulse 75, temperature 98.4 F (36.9 C), temperature source Oral, resp. rate 20, height 5\' 10"  (1.778 m), weight 97.523 kg (215 lb), SpO2 99 %.  PHYSICAL EXAMINATION:   Physical Exam  GENERAL:  54 y.o.-year-old patient lying in the bed with no acute distress.  EYES: Pupils equal, round, reactive to light and accommodation. No scleral icterus. Extraocular muscles intact.  HEENT: Head atraumatic, normocephalic. Oropharynx and nasopharynx clear.  NECK:  Supple, no jugular venous distention. No thyroid enlargement, no tenderness.  LUNGS: Normal breath sounds bilaterally, no wheezing, rhonchi, dry crackles at bases bilaterally. No use of accessory muscles of respiration.  CARDIOVASCULAR: S1, S2 normal. No murmurs, rubs, or gallops.  ABDOMEN: Soft, nontender, nondistended. Bowel sounds present. No organomegaly or mass.  EXTREMITIES: No cyanosis, clubbing or edema b/l.     NEUROLOGIC: Cranial nerves II through XII are intact. No focal Motor or sensory deficits b/l.   PSYCHIATRIC: The patient is alert and oriented x 3. Good affect.  SKIN: No obvious rash, lesion, or ulcer.    LABORATORY PANEL:   CBC  Recent Labs Lab 12/02/14 0436  WBC 8.5  HGB 13.0  HCT 39.7*  PLT 192   ------------------------------------------------------------------------------------------------------------------  Chemistries   Recent Labs Lab 12/01/14 0958 12/02/14 0436  NA 140 139  K 3.1* 3.6  CL 101 105  CO2 28 28  GLUCOSE 102* 105*  BUN 22* 17  CREATININE 1.02 0.78  CALCIUM 9.2 9.0  MG 1.7  --    ------------------------------------------------------------------------------------------------------------------  Cardiac Enzymes  Recent Labs Lab 12/01/14 2159  TROPONINI 0.07*   ------------------------------------------------------------------------------------------------------------------  RADIOLOGY:  Dg Chest 2 View  12/01/2014   CLINICAL DATA:  54 year old male with central chest pain and shortness of breath. Initial encounter. Chronic pulmonary fibrosis / UIP.  EXAM: CHEST  2 VIEW  COMPARISON:  Chest radiographs 08/24/2014 and earlier.  FINDINGS: Stable lung volumes. Basilar fibrosis/honeycombing re- demonstrated. Stable cardiomegaly and mediastinal contours. No superimposed pneumothorax, pulmonary edema, or pleural effusion. Mildly increased patchy peripheral bilateral upper lobe opacity. Visualized tracheal air column is within normal limits. No acute osseous abnormality identified.  IMPRESSION: Chronic pulmonary fibrosis and cardiomegaly. Mildly increased peripheral bilateral upper lobe opacity suspicious for acute infectious exacerbation. No pleural effusion.   Electronically Signed   By: Genevie Ann M.D.   On: 12/01/2014 10:29     ASSESSMENT AND PLAN:   53 year old male with past medical history of atrial flutter status post cardioversion,  tachycardia-induced cardiomyopathy, rheumatoid arthritis, history of Raynaud's phenomenon, hypertension who presented to  the hospital with chest pain/pressure.  #1 unstable angina/chest pain-this is likely clinical diagnosis given patient's symptoms and mildly elevated troponin.  -Currently chest pain-free and hemodynamically stable. -EKG does not show any acute ST-T wave changes. He did have a negative stress test done in April 2016. -continue aspirin, beta blocker, heparin drip, nitroglycerin as needed, Atorvastatin, enalapril. -Seen by cardiology and theyplan to do a cardiac catheterization tomorrow.  #2 history of atrial flutter status post cardioversion-patient presently is in a normal sinus rhythm. -Continue metoprolol, hold Xarelto as patient is on heparin nomogram.  #3 hypertension-continue HCTZ, metoprolol, enalapril.  #4 history of rheumatoid arthritis-continue maintenance prednisone. -Patient is followed by Dr. Cristi Loron.    All the records are reviewed and case discussed with Care Management/Social Workerr. Management plans discussed with the patient, family and they are in agreement.  CODE STATUS: Full  DVT Prophylaxis: Heparin drip  TOTAL TIME TAKING CARE OF THIS PATIENT: 30 minutes.   POSSIBLE D/C depending on cardiac cath tomorrow.     Henreitta Leber M.D on 12/02/2014 at 10:44 AM  Between 7am to 6pm - Pager - [phone]  After 6pm go to [email or web address] - password [email or web address] [facility]  [place] [facility]  Office  [phone]  CC: Primary care physician; Otilio Miu, MD

## 2014-12-02 NOTE — Plan of Care (Signed)
Problem: Phase II Progression Outcomes Goal: Stress Test if indicated Outcome: Not Applicable Date Met:  66/59/93 Pt previously had stress test performed in April 2016, pt to have cardiac cath this admission

## 2014-12-02 NOTE — Progress Notes (Addendum)
ANTICOAGULATION CONSULT NOTE - Initial Consult  Pharmacy Consult for Heparin  Indication: chest pain/ACS  No Known Allergies  Patient Measurements: Height: 5\' 10"  (177.8 cm) Weight: 215 lb (97.523 kg) IBW/kg (Calculated) : 73 Heparin Dosing Weight: 93.1 kg  Vital Signs: Temp: 97.7 F (36.5 C) (07/06 1136) Temp Source: Oral (07/06 1136) BP: 123/84 mmHg (07/06 1136) Pulse Rate: 76 (07/06 1136)  Labs:  Recent Labs  12/01/14 0958 12/01/14 1427 12/01/14 1804 12/01/14 2159 12/02/14 0436 12/02/14 0456 12/02/14 1208 12/02/14 1809  HGB 14.0  --   --   --  13.0  --   --   --   HCT 43.4  --   --   --  39.7*  --   --   --   PLT 220  --   --   --  192  --   --   --   APTT 39*  --   --   --   --  40* 49* 47*  LABPROT 19.5*  --   --   --   --   --   --   --   INR 1.63  --   --   --   --   --   --   --   HEPARINUNFRC >3.60*  --   --   --   --  2.54*  --  0.77*  CREATININE 1.02  --   --   --  0.78  --   --   --   TROPONINI 0.08* 0.05* 0.06* 0.07*  --   --   --   --     Estimated Creatinine Clearance: 122.2 mL/min (by C-G formula based on Cr of 0.78).   Medical History: Past Medical History  Diagnosis Date  . Atrial flutter     a. on Xarelto; b. s/p successful TEE/DCCV on 08/28/2014  . Pulmonary fibrosis     a. not on home oxygen  . RA (rheumatoid arthritis)   . HTN (hypertension)   . Tachycardia-induced cardiomyopathy     a. echo 07/2014: EF 20-25%, cannot exclude atrial flutter,  global HK, possible bicuspid Ao valve, mod reduced RV sys fxn, mild-mod aortic valve sclerosis/calcification, mod TR, mildly elevated RVSP; b. TEE 08/28/2014: EF 30-35%, no intracardic thrombus, mildly dilated LA/RA, mild TR, mild-mod aortic sclerosis w/o stenosis; c. echo 09/2014: EF 55-60%, select images w/ inf HK  . History of nuclear stress test     a. 09/02/2014: no sig ischemia, GI uptake noted, no sig WMA, EF 48%, no EKG chanes concerning for ischemia, low risk scan        Assessment: 54 yo  male here with unstable angina/chest pain. Pt on Xarelto PTA. Per pt last dose taken this AM at 6AM. Initially had started heparin drip based on information from med reconciliation that stated that last dose was yesterday AM. Will hold heparin drip at this time and time heparin drip to start at 7/6 at 0600 (per protocol states to initiate heparin drip at least 24h after last rivaroxaban dose). Spoke with Christell Faith (Cardiology) regarding delay in initiation of heparin drip until tomorrow AM at 6 am per protocol; stated he will pass this information off to Dr. Rockey Situ as well.   Goal of Therapy:  Heparin level 0.3-0.7 units/ml Monitor platelets by anticoagulation protocol: Yes   Plan:  Will start heparin drip at 1300 units/hr (=13 ml//hr) on 7/6 at 0600 (24h after last Xarelto dose). No bolus due to  recent anticoagulation.  Will order aPTT, anti-Xa level and CBC for AM Will need to continue to monitor for s/sx of bleeding.   7/6:  HL @ 18:00 = 0.77         APTT @ 18:00 = 47  Will increase rate to 1400 units/hr and recheck HL and aPTT on 7/7 @ 01:00.   Taneil Lazarus D 12/02/2014,7:08 PM

## 2014-12-02 NOTE — Progress Notes (Signed)
Pt alert and oriented, no complaints of pain.  NSR, on room air.  Tolerating diet, up to bathroom.  Heparin drip currently infusing at 13 ml/hr, awaiting aptt and unfractenated heparin results.  Pt to undergo cardiac cath tomorrow, consent signed and pt educated on procedure.  VSS.

## 2014-12-02 NOTE — Progress Notes (Signed)
Patient to have cath tomorrow. Consent signed and patient given cardiac catheterization education.  No complaints of pain today.  VSS, NSR

## 2014-12-03 ENCOUNTER — Telehealth: Payer: Self-pay

## 2014-12-03 ENCOUNTER — Encounter
Admission: EM | Disposition: A | Discharge status: Home / Self Care | Payer: Self-pay | Source: Home / Self Care | Attending: Specialist

## 2014-12-03 ENCOUNTER — Encounter: Payer: Self-pay | Admitting: Physician Assistant

## 2014-12-03 HISTORY — PX: CARDIAC CATHETERIZATION: SHX172

## 2014-12-03 LAB — CBC WITH DIFFERENTIAL/PLATELET
Basophils Absolute: 0 10*3/uL (ref 0–0.1)
Basophils Relative: 0 %
Eosinophils Absolute: 0.2 10*3/uL (ref 0–0.7)
Eosinophils Relative: 2 %
HCT: 40.7 % (ref 40.0–52.0)
Hemoglobin: 13.1 g/dL (ref 13.0–18.0)
Lymphocytes Relative: 10 %
Lymphs Abs: 0.9 10*3/uL — ABNORMAL LOW (ref 1.0–3.6)
MCH: 27.3 pg (ref 26.0–34.0)
MCHC: 32.3 g/dL (ref 32.0–36.0)
MCV: 84.5 fL (ref 80.0–100.0)
Monocytes Absolute: 0.6 10*3/uL (ref 0.2–1.0)
Monocytes Relative: 7 %
Neutro Abs: 7.1 10*3/uL — ABNORMAL HIGH (ref 1.4–6.5)
Neutrophils Relative %: 81 %
Platelets: 209 10*3/uL (ref 150–440)
RBC: 4.81 MIL/uL (ref 4.40–5.90)
RDW: 15.1 % — ABNORMAL HIGH (ref 11.5–14.5)
WBC: 8.8 10*3/uL (ref 3.8–10.6)

## 2014-12-03 LAB — HEPARIN LEVEL (UNFRACTIONATED)
HEPARIN UNFRACTIONATED: 0.2 [IU]/mL — AB (ref 0.30–0.70)
Heparin Unfractionated: 0.39 [IU]/mL (ref 0.30–0.70)

## 2014-12-03 LAB — APTT: aPTT: 54 s — ABNORMAL HIGH (ref 24–36)

## 2014-12-03 SURGERY — LEFT HEART CATH AND CORONARY ANGIOGRAPHY
Anesthesia: Moderate Sedation

## 2014-12-03 MED ORDER — FENTANYL CITRATE (PF) 100 MCG/2ML IJ SOLN
INTRAMUSCULAR | Status: AC
  Filled 2014-12-03: qty 2

## 2014-12-03 MED ORDER — IOHEXOL 300 MG/ML  SOLN
INTRAMUSCULAR | Status: DC | PRN
  Administered 2014-12-03: 30 mL via INTRA_ARTERIAL
  Administered 2014-12-03: 100 mL via INTRA_ARTERIAL

## 2014-12-03 MED ORDER — MIDAZOLAM HCL 2 MG/2ML IJ SOLN
INTRAMUSCULAR | Status: AC
  Filled 2014-12-03: qty 2

## 2014-12-03 MED ORDER — MIDAZOLAM HCL 2 MG/2ML IJ SOLN
INTRAMUSCULAR | Status: DC | PRN
  Administered 2014-12-03: 1 mg via INTRAVENOUS
  Administered 2014-12-03: 0.5 mg via INTRAVENOUS

## 2014-12-03 MED ORDER — HEPARIN (PORCINE) IN NACL 2-0.9 UNIT/ML-% IJ SOLN
INTRAMUSCULAR | Status: AC
  Filled 2014-12-03: qty 1000

## 2014-12-03 MED ORDER — FENTANYL CITRATE (PF) 100 MCG/2ML IJ SOLN
INTRAMUSCULAR | Status: DC | PRN
  Administered 2014-12-03: 50 ug via INTRAVENOUS
  Administered 2014-12-03 (×2): 25 ug via INTRAVENOUS

## 2014-12-03 SURGICAL SUPPLY — 10 items
CATH INFINITI 5FR ANG PIGTAIL (CATHETERS) ×2 IMPLANT
CATH INFINITI 5FR JL4 (CATHETERS) ×2 IMPLANT
CATH INFINITI JR4 5F (CATHETERS) ×2 IMPLANT
DEVICE CLOSURE MYNXGRIP 5F (Vascular Products) ×2 IMPLANT
KIT MANI 3VAL PERCEP (MISCELLANEOUS) ×2 IMPLANT
NEEDLE PERC 18GX7CM (NEEDLE) ×2 IMPLANT
NEEDLE SMART 18G ACCESS (NEEDLE) IMPLANT
PACK CARDIAC CATH (CUSTOM PROCEDURE TRAY) ×2 IMPLANT
SHEATH AVANTI 5FR X 11CM (SHEATH) ×4 IMPLANT
WIRE EMERALD 3MM-J .035X150CM (WIRE) ×2 IMPLANT

## 2014-12-03 NOTE — Progress Notes (Signed)
A&O. Independent. IVF and heparin infusing. NO complaints. Groin shaved for Cardiac Cath this AM.

## 2014-12-03 NOTE — Progress Notes (Signed)
Cardiac catheterization performed this morning Patient tolerated the procedure well with no complications No significant coronary artery disease, normal ejection fraction Etiology of left chest pain, left arm pain, shoulder pain likely atypical in nature given the findings. Would continue current medical management. Will be held in the postoperative area for several hours with pending discharge later today Would restart xarelto in 48 hours Follow up in clinic in 1-2 weeks for postop evaluation

## 2014-12-03 NOTE — OR Nursing (Signed)
Heparin at 58ml/hour turned off at 0800 in special procedures prior to procedure.

## 2014-12-03 NOTE — Telephone Encounter (Signed)
-----   Message from Blain Pais sent at 12/03/2014 11:59 AM EDT ----- Regarding: tcm/ph 12/11/2014 2:00 pm Christell Faith, PA

## 2014-12-03 NOTE — Progress Notes (Signed)
Received order for discharge, discharge instruction provided, IV removed tele removed, patient discvharge home

## 2014-12-03 NOTE — Progress Notes (Signed)
ANTICOAGULATION CONSULT NOTE - Follow Up Consult  Pharmacy Consult for Heparin Indication: chest pain/ACS  No Known Allergies  Patient Measurements: Height: 5\' 10"  (177.8 cm) Weight: 215 lb (97.523 kg) IBW/kg (Calculated) : 73 Heparin Dosing Weight: 93.1 kg  Vital Signs: Temp: 98.4 F (36.9 C) (07/06 1937) Temp Source: Oral (07/06 1937) BP: 120/77 mmHg (07/06 1937) Pulse Rate: 75 (07/06 1937)  Labs:  Recent Labs  12/01/14 0958 12/01/14 1427 12/01/14 1804 12/01/14 2159 12/02/14 0436 12/02/14 0456 12/02/14 1208 12/02/14 1809 12/03/14 0112  HGB 14.0  --   --   --  13.0  --   --   --   --   HCT 43.4  --   --   --  39.7*  --   --   --   --   PLT 220  --   --   --  192  --   --   --   --   APTT 39*  --   --   --   --  40* 49* 47* 54*  LABPROT 19.5*  --   --   --   --   --   --   --   --   INR 1.63  --   --   --   --   --   --   --   --   HEPARINUNFRC >3.60*  --   --   --   --  2.54*  --  0.77* 0.39  CREATININE 1.02  --   --   --  0.78  --   --   --   --   TROPONINI 0.08* 0.05* 0.06* 0.07*  --   --   --   --   --     Estimated Creatinine Clearance: 122.2 mL/min (by C-G formula based on Cr of 0.78).   Medications:  Scheduled:  . aspirin  81 mg Oral Pre-Cath  . atorvastatin  40 mg Oral q1800  . enalapril  20 mg Oral Daily  . hydrochlorothiazide  25 mg Oral Daily  . metoprolol  75 mg Oral BID  . predniSONE  5 mg Oral Daily  . sodium chloride  3 mL Intravenous Q12H  . sodium chloride  3 mL Intravenous Q12H   Infusions:  . sodium chloride     Followed by  . sodium chloride    . heparin 1,400 Units/hr (12/02/14 1913)   PRN: sodium chloride, acetaminophen **OR** acetaminophen, morphine injection, nitroGLYCERIN, ondansetron **OR** ondansetron (ZOFRAN) IV, sodium chloride  Assessment: 54 yo male here with unstable angina/chest pain. Pt on Xarelto PTA. Heparin level now at goal and correlating with aPTT.   Goal of Therapy:  Heparin level 0.3-0.7 units/ml Monitor  platelets by anticoagulation protocol: Yes   Plan:  Will continue heparin drip at current rate and check a confirmatory HL in 6 h.   Ulice Dash D 12/03/2014,3:17 AM

## 2014-12-03 NOTE — Telephone Encounter (Signed)
Patient contacted regarding discharge from [facility] on 12/03/14.  Patient understands to follow up with Devin Faith, PA on 12/11/14 at 2:00 at [facility]. Patient understands discharge instructions? yes Patient understands medications and regiment? yes Patient understands to bring all medications to this visit? yes

## 2014-12-03 NOTE — Discharge Instructions (Signed)

## 2014-12-03 NOTE — Discharge Summary (Signed)
[facility] [place] NAME: [name]    MR#:  [id number]  DATE OF BIRTH:  Mar 14, 1961  DATE OF ADMISSION:  12/01/2014 ADMITTING PHYSICIAN: Henreitta Leber, MD  DATE OF DISCHARGE: 12/03/2014  1:13 PM  PRIMARY CARE PHYSICIAN: Otilio Miu, MD    ADMISSION DIAGNOSIS:  NSTEMI (non-ST elevated myocardial infarction) [I21.4]  DISCHARGE DIAGNOSIS:  Principal Problem:   Unstable angina - left-sided shoulder/chest pain occurring at rest Active Problems:   Essential hypertension   Chest pain   Typical atrial flutter   NSTEMI (non-ST elevated myocardial infarction)   SECONDARY DIAGNOSIS:   Past Medical History  Diagnosis Date  . Atrial flutter     a. on Xarelto; b. s/p successful TEE/DCCV on 08/28/2014  . Pulmonary fibrosis     a. not on home oxygen  . RA (rheumatoid arthritis)   . HTN (hypertension)   . Tachycardia-induced cardiomyopathy     a. echo 07/2014: EF 20-25%, cannot exclude atrial flutter,  global HK, possible bicuspid Ao valve, mod reduced RV sys fxn, mild-mod aortic valve sclerosis/calcification, mod TR, mildly elevated RVSP; b. TEE 08/28/2014: EF 30-35%, no intracardic thrombus, mildly dilated LA/RA, mild TR, mild-mod aortic sclerosis w/o stenosis; c. echo 09/2014: EF 55-60%, select images w/ inf HK  . History of nuclear stress test     a. 09/02/2014: no sig ischemia, GI uptake noted, no sig WMA, EF 48%, no EKG chanes concerning for ischemia, low risk scan      HOSPITAL COURSE:   54 year old male with past medical history of atrial flutter status post cardioversion, tachycardia-induced cardiomyopathy, rheumatoid arthritis, history of Raynaud's phenomenon, hypertension who presented to the hospital with chest pain/pressure.  #1 unstable angina/chest pain-this was a clinical diagnosis on admission. Patient was started on a heparin nomogram maintained on his beta blocker and also started on a statin. - Patient was seen by  cardiology and underwent a cardiac catheterization on 12/03/2014 which showed no evidence of significant coronary disease. -As per cardiology the most, likely cause of patient's chest pain and mild troponin elevation was likely atrial flutter. Patient presently is chest pain-free and hemodynamically stable and therefore being discharged home with close follow-up with cardiology.  #2 history of atrial flutter status post cardioversion-patient remained in a normal sinus rhythm while in the hospital. -Patient was maintained on his metoprolol which she will continue. His Xarelto has been resumed. -Patient continues to have episodes of atrial flutter he will need to be seen by electrophysiologist for possible ablation as an outpatient  #3 hypertension-continue HCTZ, metoprolol, enalapril.  #4 history of rheumatoid arthritis-continue maintenance prednisone. -Patient is followed by Dr. Cristi Loron.   DISCHARGE CONDITIONS:   Stable  CONSULTS OBTAINED:  Treatment Team:  Minna Merritts, MD  DRUG ALLERGIES:  No Known Allergies  DISCHARGE MEDICATIONS:   Discharge Medication List as of 12/03/2014 12:18 PM    CONTINUE these medications which have NOT CHANGED   Details  enalapril (VASOTEC) 20 MG tablet Take 20 mg by mouth daily., Until Discontinued, Historical Med    hydrochlorothiazide (HYDRODIURIL) 25 MG tablet Take 25 mg by mouth daily., Until Discontinued, Historical Med    metoprolol (LOPRESSOR) 50 MG tablet Take 1.5 tablets (75 mg total) by mouth 2 (two) times daily., Starting 08/31/2014, Until Discontinued, Normal    predniSONE (DELTASONE) 5 MG tablet Take 5 mg by mouth daily., Until Discontinued, Historical Med    rivaroxaban (XARELTO) 20 MG TABS tablet Take 20 mg  by mouth every morning. , Until Discontinued, Historical Med         DISCHARGE INSTRUCTIONS:   DIET:  Cardiac diet  DISCHARGE CONDITION:  Stable  ACTIVITY:  Activity as tolerated  OXYGEN:  Home Oxygen:  No.   Oxygen Delivery: room air  DISCHARGE LOCATION:  home   If you experience worsening of your admission symptoms, develop shortness of breath, life threatening emergency, suicidal or homicidal thoughts you must seek medical attention immediately by calling 911 or calling your MD immediately  if symptoms less severe.  You Must read complete instructions/literature along with all the possible adverse reactions/side effects for all the Medicines you take and that have been prescribed to you. Take any new Medicines after you have completely understood and accpet all the possible adverse reactions/side effects.   Please note  You were cared for by a hospitalist during your hospital stay. If you have any questions about your discharge medications or the care you received while you were in the hospital after you are discharged, you can call the unit and asked to speak with the hospitalist on call if the hospitalist that took care of you is not available. Once you are discharged, your primary care physician will handle any further medical issues. Please note that NO REFILLS for any discharge medications will be authorized once you are discharged, as it is imperative that you return to your primary care physician (or establish a relationship with a primary care physician if you do not have one) for your aftercare needs so that they can reassess your need for medications and monitor your lab values.     Today   Status post cardiac catheterization, no chest pain, no palpitations.  VITAL SIGNS:  Blood pressure 145/95, pulse 80, temperature 97.9 F (36.6 C), temperature source Oral, resp. rate 18, height 5\' 10"  (1.778 m), weight 97.523 kg (215 lb), SpO2 96 %.  I/O:   Intake/Output Summary (Last 24 hours) at 12/03/14 1430 Last data filed at 12/03/14 1100  Gross per 24 hour  Intake 463.22 ml  Output   1275 ml  Net -811.78 ml    PHYSICAL EXAMINATION:  GENERAL:  54 y.o.-year-old patient lying  in the bed with no acute distress.  EYES: Pupils equal, round, reactive to light and accommodation. No scleral icterus. Extraocular muscles intact.  HEENT: Head atraumatic, normocephalic. Oropharynx and nasopharynx clear.  NECK:  Supple, no jugular venous distention. No thyroid enlargement, no tenderness.  LUNGS: Normal breath sounds bilaterally, dry crackles at bases, no rhonchi, wheezing. No use of accessory muscles of respiration.  CARDIOVASCULAR: S1, S2 RRR. No murmurs, rubs, or gallops.  ABDOMEN: Soft, non-tender, non-distended. Bowel sounds present. No organomegaly or mass.  EXTREMITIES: No pedal edema, cyanosis, or clubbing.  NEUROLOGIC: Cranial nerves II through XII are intact. No focal motor or sensory defecits b/l.  PSYCHIATRIC: The patient is alert and oriented x 3. Good affect.  SKIN: No obvious rash, lesion, or ulcer.   DATA REVIEW:   CBC  Recent Labs Lab 12/03/14 0655  WBC 8.8  HGB 13.1  HCT 40.7  PLT 209    Chemistries   Recent Labs Lab 12/01/14 0958 12/02/14 0436  NA 140 139  K 3.1* 3.6  CL 101 105  CO2 28 28  GLUCOSE 102* 105*  BUN 22* 17  CREATININE 1.02 0.78  CALCIUM 9.2 9.0  MG 1.7  --     Cardiac Enzymes  Recent Labs Lab 12/01/14 2159  TROPONINI 0.07*  Microbiology Results  Results for orders placed or performed during the hospital encounter of 08/24/14  Culture, blood (single)     Status: None   Collection Time: 08/24/14  8:49 PM  Result Value Ref Range Status   Micro Text Report   Final       COMMENT                   NO GROWTH AEROBICALLY/ANAEROBICALLY IN 5 DAYS   ANTIBIOTIC                                                      Culture, blood (single)     Status: None   Collection Time: 08/24/14  8:49 PM  Result Value Ref Range Status   Micro Text Report   Final       COMMENT                   NO GROWTH AEROBICALLY/ANAEROBICALLY IN 5 DAYS   ANTIBIOTIC                                                        RADIOLOGY:   No results found.    Management plans discussed with the patient, family and they are in agreement.  CODE STATUS:     Code Status Orders        Start     Ordered   12/01/14 1412  Full code   Continuous     12/01/14 1411      TOTAL TIME TAKING CARE OF THIS PATIENT: 40 minutes.    Henreitta Leber M.D on 12/03/2014 at 2:30 PM  Between 7am to 6pm - Pager - [phone]  After 6pm go to [email or web address] - password [email or web address] [facility]  [facility]  Office  [phone]  CC: Primary care physician; Otilio Miu, MD

## 2014-12-04 ENCOUNTER — Encounter: Payer: Self-pay | Admitting: Cardiovascular Disease

## 2014-12-04 DIAGNOSIS — IMO0001 Reserved for inherently not codable concepts without codable children: Secondary | ICD-10-CM | POA: Insufficient documentation

## 2014-12-04 DIAGNOSIS — Z0389 Encounter for observation for other suspected diseases and conditions ruled out: Secondary | ICD-10-CM

## 2014-12-11 ENCOUNTER — Encounter: Payer: Self-pay | Admitting: Physician Assistant

## 2014-12-11 ENCOUNTER — Telehealth: Payer: Self-pay | Admitting: *Deleted

## 2014-12-11 ENCOUNTER — Other Ambulatory Visit: Payer: Self-pay

## 2014-12-11 ENCOUNTER — Ambulatory Visit (INDEPENDENT_AMBULATORY_CARE_PROVIDER_SITE_OTHER): Payer: 59 | Admitting: Physician Assistant

## 2014-12-11 ENCOUNTER — Telehealth: Payer: Self-pay

## 2014-12-11 VITALS — BP 120/80 | HR 91 | Ht 70.0 in | Wt 209.2 lb

## 2014-12-11 DIAGNOSIS — R000 Tachycardia, unspecified: Secondary | ICD-10-CM

## 2014-12-11 DIAGNOSIS — I4892 Unspecified atrial flutter: Secondary | ICD-10-CM | POA: Diagnosis not present

## 2014-12-11 DIAGNOSIS — IMO0001 Reserved for inherently not codable concepts without codable children: Secondary | ICD-10-CM

## 2014-12-11 DIAGNOSIS — Z0389 Encounter for observation for other suspected diseases and conditions ruled out: Secondary | ICD-10-CM

## 2014-12-11 DIAGNOSIS — I43 Cardiomyopathy in diseases classified elsewhere: Secondary | ICD-10-CM

## 2014-12-11 DIAGNOSIS — I428 Other cardiomyopathies: Secondary | ICD-10-CM

## 2014-12-11 DIAGNOSIS — I10 Essential (primary) hypertension: Secondary | ICD-10-CM

## 2014-12-11 DIAGNOSIS — I483 Typical atrial flutter: Secondary | ICD-10-CM

## 2014-12-11 DIAGNOSIS — R5383 Other fatigue: Secondary | ICD-10-CM | POA: Diagnosis not present

## 2014-12-11 DIAGNOSIS — R002 Palpitations: Secondary | ICD-10-CM

## 2014-12-11 DIAGNOSIS — E876 Hypokalemia: Secondary | ICD-10-CM

## 2014-12-11 DIAGNOSIS — J8410 Pulmonary fibrosis, unspecified: Secondary | ICD-10-CM

## 2014-12-11 MED ORDER — HYDROCHLOROTHIAZIDE 12.5 MG PO CAPS: 12.5000 mg | ORAL_CAPSULE | Freq: Every day | ORAL | Status: DC

## 2014-12-11 MED ORDER — DILTIAZEM HCL 30 MG PO TABS: 30.0000 mg | ORAL_TABLET | Freq: Four times a day (QID) | ORAL | Status: DC

## 2014-12-11 NOTE — Patient Instructions (Addendum)
Medication Instructions:  Your physician has recommended you make the following change in your medication:  DECREASE hydrochlorothiazide to 12.5mg  once per day START taking diltiazem 30mg  every 6 hours as needed for heart rate greater than 90 beats per minute  Labwork: Your physician recommends that you have labs today: BMET   Testing/Procedures: Your physician has recommended that you wear a 14 day event monitor. Event monitors are medical devices that record the heart's electrical activity. Doctors most often [place] these monitors to diagnose arrhythmias. Arrhythmias are problems with the speed or rhythm of the heartbeat. The monitor is a small, portable device. You can wear one while you do your normal daily activities. This is usually used to diagnose what is causing palpitations/syncope (passing out).     Follow-Up: Your physician recommends that you schedule a follow-up appointment in: 4 weeks with Christell Faith, PA-C   Any Other Special Instructions Will Be Listed Below (If Applicable).  Cardiac Event Monitoring A cardiac event monitor is a small recording device used to help detect abnormal heart rhythms (arrhythmias). The monitor is used to record heart rhythm when noticeable symptoms such as the following occur:  Fast heartbeats (palpitations), such as heart racing or fluttering.  Dizziness.  Fainting or light-headedness.  Unexplained weakness. The monitor is wired to two electrodes placed on your chest. Electrodes are flat, sticky disks that attach to your skin. The monitor can be worn for up to 30 days. You will wear the monitor at all times, except when bathing.  HOW TO USE YOUR CARDIAC EVENT MONITOR A technician will prepare your chest for the electrode placement. The technician will show you how to place the electrodes, how to work the monitor, and how to replace the batteries. Take time to practice using the monitor before you leave the office. Make sure you understand how to  send the information from the monitor to your health care provider. This requires a telephone with a landline, not a cell phone. You need to:  Wear your monitor at all times, except when you are in water:  Do not get the monitor wet.  Take the monitor off when bathing. Do not swim or use a hot tub with it on.  Keep your skin clean. Do not put body lotion or moisturizer on your chest.  Change the electrodes daily or any time they stop sticking to your skin. You might need to use tape to keep them on.  It is possible that your skin under the electrodes could become irritated. To keep this from happening, try to put the electrodes in slightly different places on your chest. However, they must remain in the area under your left breast and in the upper right section of your chest.  Make sure the monitor is safely clipped to your clothing or in a location close to your body that your health care provider recommends.  Press the button to record when you feel symptoms of heart trouble, such as dizziness, weakness, light-headedness, palpitations, thumping, shortness of breath, unexplained weakness, or a fluttering or racing heart. The monitor is always on and records what happened slightly before you pressed the button, so do not worry about being too late to get good information.  Keep a diary of your activities, such as walking, doing chores, and taking medicine. It is especially important to note what you were doing when you pushed the button to record your symptoms. This will help your health care provider determine what might be contributing to your  symptoms. The information stored in your monitor will be reviewed by your health care provider alongside your diary entries.  Send the recorded information as recommended by your health care provider. It is important to understand that it will take some time for your health care provider to process the results.  Change the batteries as recommended by  your health care provider. SEEK IMMEDIATE MEDICAL CARE IF:   You have chest pain.  You have extreme difficulty breathing or shortness of breath.  You develop a very fast heartbeat that persists.  You develop dizziness that does not go away.  You faint or constantly feel you are about to faint. Document Released: 02/22/2008 Document Revised: 09/29/2013 Document Reviewed: 11/11/2012 [facility] Patient Information 2015 [place], [place]. This information is not intended to replace advice given to you by your health care provider. Make sure you discuss any questions you have with your health care provider.

## 2014-12-11 NOTE — Telephone Encounter (Signed)
S/w Ro at [facility], [name], that pt wants prescriptions sent to [facility] [place]. Resubmitted prescriptions to [facility]

## 2014-12-11 NOTE — Telephone Encounter (Signed)
°  1. Which medications need to be refilled? Diliazem 30 mg   2. Which pharmacy is [facility] [place]  3. Do they need a 30 day or 90 day supply? 90   4. Would they like a call back once the medication has been sent to the pharmacy? No

## 2014-12-11 NOTE — Progress Notes (Signed)
Cardiology Hospital Follow Up Note:   Date of Encounter: 12/11/2014  ID: [name], DOB December 09, 1960, MRN [id number]  PCP: Otilio Miu, MD Primary Cardiologist: Dr. Rockey Situ, MD  Chief Complaint  Patient presents with  . Other    Follow up from [facility] s/p cardiac cath. Pt. c/o feeling fatigue with rapid heart beats. Meds reviewed by the patient verbally.      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 from 7/5-7/7 for atypical chest pain in the setting of recent palpitations, possibly recurrence of atrial flutter prior to his admission.   He was recently admitted to [facility] in April of 2016 for 3 week history of increased dyspnea and palpitations and found to be in new onset atrial flutter. Troponin at that time showed 0.05-->0.05-->0.07. TTE showed EF 20-25%, global HK, possible bicuspid aortic valve. He underwent successful TEE/DCCV on 4/1. 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 He was continued on Xarelto 20 mg. In hospital follow up he was doing well and remained in NSR. He was scheduled for a nuclear stress test based on the above echo which showed no significant ischemia, GI uptake was noted on the study, no significant wall motion abnormality, EF 48%, no EKG changes concerning for ischemia, overall low risk scan. After he remained in NSR for at least 1 month he underwent a repeat TTE that showed improved EF of 55-60%, select images suggestive of hypokinesis of the inferior and posterior myocardium. LV diastolic parameters were normal. Left atrium was normal in size. RV function was normal. PASP was  normal. He was advised further work up if he has symptoms.   On 7/1 while at work he suddenly developed onset of palpitations that led to increased SOB, especially with ambulation causing patient to leave work early. Patient checked his pulse and found it to be in the 140's. He did not seek medical care at that time. Pulse went back into the 60's to 80's without intervention. He had an fairly uneventful day on 7/2. On 7/3 he began to develop some intermittent chest pains while at rest. No associated symptoms at that time. The pains persisted into 7/4 intermittently. He did take some Tylenol Arthritis and Phentermine. While he was sleeping going into 7/5 he was woken up around 2 AM with left shoulder pain that radiated to his left chest. Pain was associated with some nausea and SOB. Pain was rated as 10/10. He presented to [facility] for evaluation. Troponin was found to be mildly elevated and flated trending 0.08-->0.05-->0.06-->0.07. EKG with NSR, 82 bpm, incomplete RBBB, left anterior fascicular block, TWI III. He underwent cardiac cath 48 hours after he last took Xarelto (scheduling issue in cath lab) which showed right dominant system, no significant CAD, normal LV gram with EF >55%, no significant MR or AS. The etiology of his chest pain was felt to be atypical in etiology and his SOB was felt to be secondary to his underlying pulmonary fibrosis. Medical management was recommended.  He notes intermittent palpitations with heart rate into the 120's at times since his hospital discharge. Most recently on the evening on 7/14 while at work. His heart  rate has for the most part remained in the 90's. This has led to some increased SOB. Weight has remained stable and is actually down 5 pounds from April. He has not had any chest pain, lower extremity edema, or abdominal distension. He limits his salt and does not drink excessive fluids. He is taking his medications as directed.     Past Medical History  Diagnosis  Date  . Atrial flutter     a. on Xarelto; b. s/p successful TEE/DCCV on 08/28/2014  . Pulmonary fibrosis     a. not on home oxygen  . RA (rheumatoid arthritis)   . HTN (hypertension)   . Tachycardia-induced cardiomyopathy     a. echo 07/2014: EF 20-25%, cannot exclude atrial flutter,  global HK, possible bicuspid Ao valve, mod reduced RV sys fxn, mild-mod aortic valve sclerosis/calcification, mod TR, mildly elevated RVSP; b. TEE 08/28/2014: EF 30-35%, no intracardic thrombus, mildly dilated LA/RA, mild TR, mild-mod aortic sclerosis w/o stenosis; c. echo 09/2014: EF 55-60%, select images w/ inf HK  . History of nuclear stress test     a. 09/02/2014: no sig ischemia, GI uptake noted, no sig WMA, EF 48%, no EKG chanes concerning for ischemia, low risk scan    . Normal coronary arteries     a. cardiac cath 12/03/2014: no sigificant CAD, right dominant system, LVEF >55%, no MR or AS  : Past Surgical History  Procedure Laterality Date  . Cardiac catheterization N/A 12/03/2014    Procedure: Left Heart Cath and Coronary Angiography;  Surgeon: Minna Merritts, MD;  Location: [facility];  Service: Cardiovascular;  Laterality: N/A;  : Family History  Problem Relation Age of Onset  . Leukemia Mother   :  reports that he quit smoking about 10 years ago. His smoking use included Cigarettes. He has a 20 pack-year smoking history. He does not have any smokeless tobacco history on file. He reports that he does not drink alcohol or use illicit drugs.:   Allergies:  No Known Allergies   Home Medications:  Current Outpatient Prescriptions  Medication Sig Dispense Refill  . enalapril (VASOTEC) 20 MG tablet Take 20 mg by mouth daily.    . metoprolol (LOPRESSOR) 50 MG tablet Take 1.5 tablets (75 mg total) by mouth 2 (two) times daily. 90 tablet 6  . predniSONE (DELTASONE) 5 MG tablet Take 5 mg by mouth daily.    . rivaroxaban (XARELTO) 20 MG TABS tablet Take 20 mg by mouth every morning.     .  diltiazem (CARDIZEM) 30 MG tablet Take 1 tablet (30 mg total) by mouth 4 (four) times daily. Every 6 hours as needed for heart rate greater than 90 bpm 30 tablet 0  . hydrochlorothiazide (MICROZIDE) 12.5 MG capsule Take 1 capsule (12.5 mg total) by mouth daily. 30 capsule 3   No current facility-administered medications for this visit.     Review of Systems:  Review of Systems  Constitutional: Positive for malaise/fatigue. Negative for fever, chills, weight loss and diaphoresis.  HENT: Negative for congestion.   Eyes: Negative for blurred vision, discharge and redness.  Respiratory: Positive for shortness of breath. Negative for cough, hemoptysis, sputum production and wheezing.   Cardiovascular: Positive for palpitations. Negative for chest pain, orthopnea, claudication, leg swelling and PND.  Gastrointestinal: Negative for heartburn, nausea, vomiting, abdominal pain and melena.  Genitourinary: Negative for hematuria.  Musculoskeletal: Negative for myalgias and falls.  Skin: Negative for rash.  Neurological: Positive for weakness. Negative for dizziness,  tingling, tremors, sensory change, speech change, focal weakness and headaches.  Endo/Heme/Allergies: Does not bruise/bleed easily.  Psychiatric/Behavioral: The patient is not nervous/anxious.   All other systems reviewed and are negative.    Physical Exam:  Blood pressure 120/80, pulse 91, height 5\' 10"  (1.778 m), weight 209 lb 4 oz (94.915 kg). BMI: Body mass index is 30.02 kg/(m^2). General: Pleasant, NAD. Psych: Normal affect. Responds to questions with normal affect.  Neuro: Alert and oriented X 3. Moves all extremities spontaneously. HEENT: Normocephalic, atraumatic. EOM intact bilaterally. Sclera anicteric.  Neck: Trachea midline. Supple without bruits or JVD. Lungs:  Coarse breath sounds. Respirations regular and unlabored.   Heart: RRR, normal s3, s4. No murmurs, rubs, or gallops.  Abdomen: Soft, non-tender,  non-distended, BS + x 4.  Extremities: No clubbing, cyanosis or edema. DP/PT/Radials 2+ and equal bilaterally.   Accessory Clinical Findings:  EKG: NSR, 91 bpm, incomplete RBBB, left anterior fascicular block, bifascicular block, nonspecific st/t changes lead III  Recent Labs: 12/01/2014: Magnesium 1.7 12/02/2014: BUN 17; Creatinine, Ser 0.78; Potassium 3.6; Sodium 139 12/03/2014: Hemoglobin 13.1; Platelets 209  No results found for: CHOL, TRIG, HDL, CHOLHDL, VLDL, LDLCALC, LDLDIRECT  Weights: Wt Readings from Last 3 Encounters:  12/11/14 209 lb 4 oz (94.915 kg)  12/01/14 215 lb (97.523 kg)  08/31/14 214 lb 4 oz (97.183 kg)    Estimated Creatinine Clearance: 120.7 mL/min (by C-G formula based on Cr of 0.78).   Other studies Reviewed: Additional studies/ records that were reviewed today include: prior OV and [facility] hospitalization.  Assessment & Plan:  1. Atypical chest pain:  -It certainly appears that perhaps he does not tolerate episodes of atrial flutter well given history of palpitations and heart rate in the 140's to 150's that preceded his chest pain leading to his admission earlier in July  -Cardiac cath 12/03/2014 with no significant CAD -No anginal symptoms, no further ischemia work up at this time  -On Xarelto in place of aspirin     2. 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 Pulmonary fibrosis:  -Not on home oxygen  -Stable  -Follow up with pulmonology as directed, has appointment on 7/18   4. HTN:  -Lopressor 75 mg bid, enalapril 20 mg daily -Decrease HCTZ to 12.5 mg daily in the setting of the addition of the above diltiazem    5. History of tachycardia induced cardiomyopathy:  -Resolved  -EF by echo in May 55-60% and >55% by LV gram on cardiac cath 12/03/2014  Dispo: -Follow up post event monitor   Current medicines are reviewed  at length with the patient today.  The patient did not have any concerns regarding medicines.   Christell Faith, PA-C [address], [place] [id number] [phone] [facility] 12/11/2014, 3:37 PM

## 2014-12-12 LAB — BASIC METABOLIC PANEL
BUN/Creatinine Ratio: 27 — ABNORMAL HIGH (ref 9–20)
BUN: 24 mg/dL (ref 6–24)
CHLORIDE: 99 mmol/L (ref 97–108)
CO2: 23 mmol/L (ref 18–29)
Calcium: 9.2 mg/dL (ref 8.7–10.2)
Creatinine, Ser: 0.88 mg/dL (ref 0.76–1.27)
GFR calc Af Amer: 112 mL/min/{1.73_m2} (ref >59)
GFR calc non Af Amer: 97 mL/min/{1.73_m2} (ref >59)
Glucose: 93 mg/dL (ref 65–99)
POTASSIUM: 4.2 mmol/L (ref 3.5–5.2)
Sodium: 138 mmol/L (ref 134–144)

## 2014-12-14 ENCOUNTER — Inpatient Hospital Stay: Payer: Self-pay | Admitting: Family Medicine

## 2014-12-15 ENCOUNTER — Ambulatory Visit (INDEPENDENT_AMBULATORY_CARE_PROVIDER_SITE_OTHER): Payer: 59

## 2014-12-15 DIAGNOSIS — R002 Palpitations: Secondary | ICD-10-CM

## 2014-12-16 ENCOUNTER — Telehealth: Payer: Self-pay

## 2014-12-16 NOTE — Telephone Encounter (Signed)
Received call from [facility].  Cardiac Report printed and given to Christell Faith, PA-C S/w pt who reports SOB and rapid heart (states it is around 112bpm )  when moving around.  Now 86-90 while on the phone.  States he was sitting, had just finished eating around 1:30pm when symptoms began. States pain in chest and left shoulder when he inhales.   Indicated he worked yesterday 6pm-8:45pm. Left because he felt bad. Improved once he got home and relaxed. Felt better this morning.  Forward to [employer], PA-C

## 2014-12-16 NOTE — Telephone Encounter (Signed)
-  Please have him increase his metoprolol to 100 mg bid. -Continue diltiazem prn. -Increase his magnesium intake. Magnesium rich foods include: dried fruit, prunes, raisins, peaches, apricots and dates, tomatoes, potatoes, carrots, radishes and garlic are good vegetables to eat more of.  Please go ahead and schedule an appointment to have him scheduled to be seen by EP. If we can have his atrial flutter ablated prior to degeneration to Afib this would be best.

## 2014-12-16 NOTE — Telephone Encounter (Signed)
S/w pt who states since diltiazem has been added to medications, he has taken it approximately 5 times. States his heart rate will do down "for awhile" then it goes back up. Indicates he did take one dose during episode

## 2014-12-16 NOTE — Telephone Encounter (Signed)
Left message on VM for pt with instructions regarding holter monitor and CB number if any further questions.

## 2014-12-16 NOTE — Telephone Encounter (Signed)
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 States he felt yesterday when he was working his "heart was pacing". States he did put it back on when he got off work. Please call.

## 2014-12-16 NOTE — Telephone Encounter (Signed)
1. Has patient taken any diltiazem 30 mg since he has been wearing the monitor?  2. Did he have to take any with this above episode?  3. Await further reports from monitoring company.

## 2014-12-17 NOTE — Telephone Encounter (Signed)
S/w pt regarding Ryan's recommendations. Pt states he will continue diltiazem PRN, increase mag rich foods and increase metoprolol.  Reviewed mag rich foods. Pt asked to have list of these foods emailed to him at [email or web address] Forwarded message to scheduling who will set up appt with Caryl Comes.

## 2014-12-18 ENCOUNTER — Telehealth: Payer: Self-pay

## 2014-12-18 NOTE — Telephone Encounter (Signed)
Received call from [employer] regarding PVCs. Report pt was asymptomatic.  Called patient, left message on cell phone voice mail to CB. Report given to Christell Faith for review

## 2015-01-04 ENCOUNTER — Ambulatory Visit (INDEPENDENT_AMBULATORY_CARE_PROVIDER_SITE_OTHER): Payer: 59 | Admitting: Cardiovascular Disease

## 2015-01-04 ENCOUNTER — Encounter: Payer: Self-pay | Admitting: Cardiovascular Disease

## 2015-01-04 VITALS — BP 136/86 | HR 71 | Ht 70.0 in | Wt 209.8 lb

## 2015-01-04 DIAGNOSIS — R000 Tachycardia, unspecified: Secondary | ICD-10-CM

## 2015-01-04 DIAGNOSIS — I43 Cardiomyopathy in diseases classified elsewhere: Secondary | ICD-10-CM

## 2015-01-04 DIAGNOSIS — I4892 Unspecified atrial flutter: Secondary | ICD-10-CM | POA: Diagnosis not present

## 2015-01-04 DIAGNOSIS — I10 Essential (primary) hypertension: Secondary | ICD-10-CM | POA: Diagnosis not present

## 2015-01-04 DIAGNOSIS — J8410 Pulmonary fibrosis, unspecified: Secondary | ICD-10-CM

## 2015-01-04 NOTE — Assessment & Plan Note (Signed)
No significant atrial flutter in the past month. Continue current medications If he has recurrent atrial flutter, could consider ablation

## 2015-01-04 NOTE — Assessment & Plan Note (Signed)
Ejection fraction close to normal in the setting of normal sinus rhythm. No further medication changes at this time Appears euvolemic on today's visit

## 2015-01-04 NOTE — Assessment & Plan Note (Signed)
He has follow-up with Dr. Raul Del, pulmonary for his pulmonary fibrosis

## 2015-01-04 NOTE — Progress Notes (Signed)
Patient ID: [name], male    DOB: 03-14-1961, 54 y.o.   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 Raul Del, HTN, and rheumatoid arthritis,  recent admission to [facility] from 7/5-12/03/14  for atypical chest pain in the setting of recent palpitations, cardiac catheterization showing no significant CAD who presents for routine follow-up to the [facility] office for follow-up of his atrial flutter  In follow-up today, he denies any further arrhythmia. Overall feels well with no complaints. He has recently completed a 30 day monitor that showed 3 episodes of PVCs in a bigeminal manner. He was asymptomatic. Arrhythmia sometimes in the morning, other times in the early evening. Was not on a consistent basis.  Reports he is taking diltiazem 30 mg twice a day, takes metoprolol 75 mg twice a day. No problems with blood thinners.  EKG on today's visit shows normal sinus rhythm with rate 71 bpm, PVCs in a bigeminal manner, nonspecific T wave abnormality anterior precordial leads  Other past medical history  admitted to [facility] in April of 2016 for 3 week history of increased dyspnea and palpitations and found to be in new onset atrial flutter.  TTE showed EF 20-25%, global HK, possible bicuspid aortic valve.   successful TEE/DCCV on 4/1. 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   Repeat TTE  showed improved EF of 55-60%, select images suggestive of hypokinesis of the inferior and posterior myocardium. LV diastolic parameters were normal. Left atrium was normal in size. RV function was normal. PASP was normal. He was advised further work up if he has symptoms.   On 11/27/14  while at work he suddenly developed onset of palpitations that led to increased SOB, especially with ambulation causing patient to leave work early. Patient checked his pulse and found it to be in the 140's.Pulse went back into the 60's to 80's without intervention.  Recurrent chest pain 7/5 he was woken up around 2 AM with left shoulder pain that radiated to his left chest.  He presented to [facility] for evaluation. Troponin was found to be mildly elevated and flated trending 0.08-->0.05-->0.06-->0.07.   He underwent cardiac cath 48 hours which showed right dominant system, no significant CAD, normal LV gram with EF >55%    No Known Allergies  Current Outpatient Prescriptions on File Prior to Visit  Medication Sig Dispense Refill  . diltiazem (CARDIZEM) 30 MG tablet Take 1 tablet (30 mg total) by mouth 4 (four) times daily. Every 6 hours as needed for heart rate greater than 90 bpm 30 tablet 0  . enalapril (VASOTEC) 20 MG tablet Take 20 mg by mouth daily.    . hydrochlorothiazide (MICROZIDE) 12.5 MG capsule Take 1 capsule (12.5 mg total) by mouth daily. 90 capsule 1  . metoprolol (LOPRESSOR) 50 MG tablet Take 1.5 tablets (75 mg total) by mouth 2 (two) times daily. 90 tablet 6  . predniSONE (DELTASONE) 5 MG tablet Take 5 mg by mouth daily.    . rivaroxaban (XARELTO) 20 MG TABS tablet Take 20 mg by mouth every morning.      No current facility-administered medications on file prior to visit.    Past Medical History  Diagnosis Date  .  Atrial flutter     a. on Xarelto; b. s/p successful TEE/DCCV on 08/28/2014  . Pulmonary fibrosis     a. not on home oxygen  . RA (rheumatoid arthritis)   . HTN (hypertension)   . Tachycardia-induced cardiomyopathy     a. echo 07/2014: EF 20-25%, cannot exclude atrial flutter,  global HK, possible bicuspid Ao valve, mod reduced RV sys fxn, mild-mod aortic valve sclerosis/calcification, mod TR, mildly elevated RVSP; b. TEE 08/28/2014: EF 30-35%, no intracardic thrombus,  mildly dilated LA/RA, mild TR, mild-mod aortic sclerosis w/o stenosis; c. echo 09/2014: EF 55-60%, select images w/ inf HK  . History of nuclear stress test     a. 09/02/2014: no sig ischemia, GI uptake noted, no sig WMA, EF 48%, no EKG chanes concerning for ischemia, low risk scan    . Normal coronary arteries     a. cardiac cath 12/03/2014: no sigificant CAD, right dominant system, LVEF >55%, no MR or AS    Past Surgical History  Procedure Laterality Date  . Cardiac catheterization N/A 12/03/2014    Procedure: Left Heart Cath and Coronary Angiography;  Surgeon: Minna Merritts, MD;  Location: [facility];  Service: Cardiovascular;  Laterality: N/A;    Social History  reports that he quit smoking about 10 years ago. His smoking use included Cigarettes. He has a 20 pack-year smoking history. He does not have any smokeless tobacco history on file. He reports that he does not drink alcohol or use illicit drugs.  Family History family history includes Leukemia in his mother.   Review of Systems  Constitutional: Negative.   Respiratory: Negative.   Cardiovascular: Negative.   Gastrointestinal: Negative.   Musculoskeletal: Negative.   Allergic/Immunologic: Negative.   Neurological: Negative.   Hematological: Negative.   Psychiatric/Behavioral: Negative.   All other systems reviewed and are negative.   BP 136/86 mmHg  Pulse 71  Ht 5\' 10"  (1.778 m)  Wt 209 lb 12 oz (95.142 kg)  BMI 30.10 kg/m2  Physical Exam  Constitutional: He is oriented to person, place, and time. He appears well-developed and well-nourished.  HENT:  Head: Normocephalic.  Nose: Nose normal.  Mouth/Throat: Oropharynx is clear and moist.  Eyes: Conjunctivae are normal. Pupils are equal, round, and reactive to light.  Neck: Normal range of motion. Neck supple. No JVD present.  Cardiovascular: Normal rate, regular rhythm, S1 normal, S2 normal, normal heart sounds and intact distal pulses.  Exam reveals no  gallop and no friction rub.   No murmur heard. Pulmonary/Chest: Effort normal. No respiratory distress. He has no wheezes. He has rales. He exhibits no tenderness.  Abdominal: Soft. Bowel sounds are normal. He exhibits no distension. There is no tenderness.  Musculoskeletal: Normal range of motion. He exhibits no edema or tenderness.  Lymphadenopathy:    He has no cervical adenopathy.  Neurological: He is alert and oriented to person, place, and time. Coordination normal.  Skin: Skin is warm and dry. No rash noted. No erythema.  Psychiatric: He has a normal mood and affect. His behavior is normal. Judgment and thought content normal.      Assessment and Plan   Nursing note and vitals reviewed.

## 2015-01-04 NOTE — Assessment & Plan Note (Signed)
Blood pressure is well controlled on today's visit. No changes made to the medications. 

## 2015-01-04 NOTE — Patient Instructions (Signed)
You are doing well. No medication changes were made.  Please call us if you have new issues that need to be addressed before your next appt.  Your physician wants you to follow-up in: 6 months.  You will receive a reminder letter in the mail two months in advance. If you don't receive a letter, please call our office to schedule the follow-up appointment.   

## 2015-01-12 ENCOUNTER — Institutional Professional Consult (permissible substitution): Payer: 59 | Admitting: Internal Medicine

## 2015-02-03 ENCOUNTER — Other Ambulatory Visit: Payer: Self-pay

## 2015-02-03 ENCOUNTER — Other Ambulatory Visit: Payer: Self-pay | Admitting: Family Medicine

## 2015-02-03 DIAGNOSIS — I4891 Unspecified atrial fibrillation: Secondary | ICD-10-CM

## 2015-02-03 MED ORDER — RIVAROXABAN 20 MG PO TABS: 20.0000 mg | ORAL_TABLET | ORAL | Status: DC

## 2015-02-05 ENCOUNTER — Ambulatory Visit: Payer: 59 | Admitting: Cardiovascular Disease

## 2015-03-26 ENCOUNTER — Other Ambulatory Visit: Payer: Self-pay | Admitting: Family Medicine

## 2015-05-07 ENCOUNTER — Encounter: Payer: Self-pay | Admitting: Sports Medicine

## 2015-05-07 ENCOUNTER — Ambulatory Visit (INDEPENDENT_AMBULATORY_CARE_PROVIDER_SITE_OTHER): Payer: 59 | Admitting: Sports Medicine

## 2015-05-07 ENCOUNTER — Ambulatory Visit (INDEPENDENT_AMBULATORY_CARE_PROVIDER_SITE_OTHER): Payer: 59

## 2015-05-07 ENCOUNTER — Other Ambulatory Visit: Payer: Self-pay | Admitting: Sports Medicine

## 2015-05-07 DIAGNOSIS — M79672 Pain in left foot: Secondary | ICD-10-CM

## 2015-05-07 DIAGNOSIS — M7662 Achilles tendinitis, left leg: Secondary | ICD-10-CM | POA: Diagnosis not present

## 2015-05-07 DIAGNOSIS — M79671 Pain in right foot: Secondary | ICD-10-CM | POA: Diagnosis not present

## 2015-05-07 MED ORDER — MELOXICAM 15 MG PO TABS: 15.0000 mg | ORAL_TABLET | Freq: Every day | ORAL | Status: DC

## 2015-05-07 MED ORDER — TRIAMCINOLONE ACETONIDE 10 MG/ML IJ SUSP: 10.0000 mg | Freq: Once | INTRAMUSCULAR | Status: DC

## 2015-05-07 NOTE — Progress Notes (Deleted)
   Subjective:    Patient ID: [name], male    DOB: 12-03-1960, 54 y.o.   MRN: [id number]  HPI    Review of Systems  Musculoskeletal: Positive for gait problem.  All other systems reviewed and are negative.      Objective:   Physical Exam        Assessment & Plan:

## 2015-05-07 NOTE — Progress Notes (Signed)
Patient ID: [name], male   DOB: [birth year], 54 y.o.   MRN: [id number] Subjective: [name] is a 54 y.o. male patient who presents to office for evaluation of Left foot pain. Patient complains of progressive pain especially over the last few days at the left Achilles. Reports he had to do a walking test and afterwards had pain in the back of heel; doesn't recall a past history of the heel bothering him however does admits to sedentary lifestyle without much exertion besides work; works on concrete floors in [employer].  Patient has tried tylenol and changing shoes with mild relief in symptoms. Patient denies any other pedal complaints.   Patient Active Problem List   Diagnosis Date Noted  . Normal coronary arteries   . Unstable angina - left-sided shoulder/chest pain occurring at rest 12/02/2014  . Chest pain 12/01/2014  . Typical atrial flutter ([place])   . 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 Essential hypertension   . RA (rheumatoid arthritis) ([place])   . Pulmonary fibrosis ([place])   . Atrial flutter [facility])    Current Outpatient Prescriptions on File Prior to Visit  Medication Sig Dispense Refill  . diltiazem (CARDIZEM) 30 MG tablet Take 1 tablet (30 mg total) by mouth 4 (four) times daily. Every 6 hours as needed for heart rate greater than 90 bpm 30 tablet 0  . enalapril (VASOTEC) 20 MG tablet TAKE 1 TABLET BY MOUTH EVERY DAY 30 tablet 2  . hydrochlorothiazide (HYDRODIURIL) 25 MG tablet TAKE 1 TABLET BY MOUTH EVERY DAY 30 tablet 2  . hydrochlorothiazide (MICROZIDE) 12.5 MG capsule Take 1 capsule (12.5 mg total) by mouth daily. 90 capsule 1  . metoprolol (LOPRESSOR) 50 MG tablet Take 1.5 tablets (75 mg total) by mouth 2 (two) times daily. 90 tablet 6  . predniSONE (DELTASONE) 5 MG tablet Take 5 mg by mouth daily.    . rivaroxaban (XARELTO) 20 MG TABS tablet Take 1 tablet (20 mg total) by mouth every morning. 30 tablet 3  . XARELTO 20 MG TABS tablet TAKE  1 TABLET BY MOUTH EVERY DAY 60 tablet 0   No current facility-administered medications on file prior to visit.   No Known Allergies   Objective:  General: Alert and oriented x3 in no acute distress  Dermatology: No open lesions bilateral lower extremities, no webspace macerations, no ecchymosis bilateral, all nails x 10 are well manicured.  Vascular: Dorsalis Pedis and Posterior Tibial pedal pulses 2/4, Capillary Fill Time 3 seconds, + pedal hair growth bilateral, no edema bilateral lower extremities, Temperature gradient within normal limits.  Neurology: Gross sensation intact via light touch bilateral, Protective sensation intact  with Semmes Weinstein Monofilament to all pedal sites, Position sense intact, vibratory intact bilateral, Deep tendon reflexes within normal limits bilateral, No babinski sign present bilateral. -Tinels sign.   Musculoskeletal: Mild tenderness with palpation at the lateral insertion of the Achilles on Left, there is minimal calcaneal exostosis with mild soft tissue buldge/fullness present, The achilles tendon feels intact with no nodularity or palpable dell, Thompson sign negative, Ankle, Subtalar, and midtarsal joint range of motion is within normal limits, there is no 1st ray hypermobility or forefoot deformity noted bilateral. Pes planus foot type.   Gait: Mildly antalgic gait  Xrays: Left Foot 3 views  Impression: Normal osseous mineralization. Joint spaces preserved. No fracture/dislocation/boney destruction. Pes planus foot type. Mild hammertoe deformity. Significant posterior and mild inferior Calcaneal spurs present. Kager's triangle intact with no obliteration. No soft tissue  abnormalities or radiopaque foreign bodies.   Assessment and Plan: Problem List Items Addressed This Visit    None    Visit Diagnoses    Heel pain, left    -  Primary    Relevant Medications    triamcinolone acetonide (KENALOG) 10 MG/ML injection 10 mg    meloxicam (MOBIC)  15 MG tablet    Tendonitis, Achilles, left        Relevant Medications    triamcinolone acetonide (KENALOG) 10 MG/ML injection 10 mg    meloxicam (MOBIC) 15 MG tablet      -Complete examination performed -Xrays reviewed -Discussed treatement options -After verbal consent injected 2cc mixture of 2% lidocaine plain, 0.5% marcaine plain, Kenalog 10, and dex to left lateral achilles insertion with care not to violate the Achilles tendon. Patient tolerated the injection well without complication. Advised rest and protection. -Rx Meloxicam, ice, night splint, and gentle stretching. -Work note provided for today. Patient may return to work and normal activities on tomorrow as tolerated care to protect the Achilles tendon.  -Patient to return to office in 3 weeks or sooner if condition worsens. May consider heel lift or more aggressive treatment if heel is still bothersome.   Landis Martins, DPM

## 2015-05-07 NOTE — Patient Instructions (Addendum)

## 2015-05-10 ENCOUNTER — Telehealth: Payer: Self-pay | Admitting: *Deleted

## 2015-05-10 NOTE — Telephone Encounter (Signed)
Pt states he is still having foot pain and was not able to go to work today.  Pt states he was out of work after his office visit 05/07/2015 and needs a note to return to work tomorrow.  Pt is a [place] pt and the note is faxed to [phone].

## 2015-05-10 NOTE — Telephone Encounter (Signed)
Okay, I got your fax but wasn't sure what to do with it. Now I know. Thanks!  Ill give it to him tomorrow, th

## 2015-05-17 ENCOUNTER — Other Ambulatory Visit: Payer: Self-pay | Admitting: Cardiovascular Disease

## 2015-05-28 ENCOUNTER — Ambulatory Visit: Payer: 59 | Admitting: Sports Medicine

## 2015-06-10 ENCOUNTER — Ambulatory Visit
Admission: EM | Admit: 2015-06-10 | Discharge: 2015-06-10 | Disposition: A | Discharge status: Home / Self Care | Payer: 59 | Attending: Family Medicine | Admitting: Family Medicine

## 2015-06-10 ENCOUNTER — Encounter: Payer: Self-pay | Admitting: *Deleted

## 2015-06-10 DIAGNOSIS — J0110 Acute frontal sinusitis, unspecified: Secondary | ICD-10-CM

## 2015-06-10 MED ORDER — AZITHROMYCIN 250 MG PO TABS: ORAL_TABLET | ORAL | Status: DC

## 2015-06-10 MED ORDER — FLUTICASONE PROPIONATE 50 MCG/ACT NA SUSP: 2.0000 | Freq: Every day | NASAL | Status: DC

## 2015-06-10 NOTE — Discharge Instructions (Signed)

## 2015-06-10 NOTE — ED Provider Notes (Signed)
CSN: [id number]     Arrival date & time 06/10/15  0820 History   First MD Initiated Contact with Patient 06/10/15 [phone]     Chief Complaint  Patient presents with  . Nasal Congestion   (Consider location/radiation/quality/duration/timing/severity/associated sxs/prior Treatment) HPI   Subjective 55 year old very pleasant African-American male who presents with a sinus infection. He states that for about a week and a half he's had severe sinus pressure which is exacerbated with bending forward green sputum from his nose but no fever or chills. On examination today he is afebrile pulse of 45 respirations 18 blood pressure 152/102 and an O2 sat of 97%. Significant comorbidities related to his blood pressure and vital signs include primary fibrosis hypertension and high doses of beta blockers to help relieve tachycardia-induced cardiomyopathy that he has had in the past. In addition he has a history of atrial flutter and non-STEMI. He states that he is asymptomatic from his low heart rate and he does have an appointment next week to discuss this with his cardiologist to clean need a blood pressure and heart rate adjustment.  Past Medical History  Diagnosis Date  . Atrial flutter ([place])     a. on Xarelto; b. s/p successful TEE/DCCV on 08/28/2014  . Pulmonary fibrosis ([place])     a. not on home oxygen  . RA (rheumatoid arthritis) ([place])   . HTN (hypertension)   . 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 echo 07/2014: EF 20-25%, cannot exclude atrial flutter,  global HK, possible bicuspid Ao valve, mod reduced RV sys fxn, mild-mod aortic valve sclerosis/calcification, mod TR, mildly elevated RVSP; b. TEE 08/28/2014: EF 30-35%, no intracardic thrombus, mildly dilated LA/RA, mild TR, mild-mod aortic sclerosis w/o stenosis; c. echo 09/2014: EF 55-60%, select images w/ inf HK  . History of nuclear stress test     a. 09/02/2014: no sig ischemia, GI uptake noted, no sig WMA, EF 48%, no EKG chanes concerning for  ischemia, low risk scan    . Normal coronary arteries     a. cardiac cath 12/03/2014: no sigificant CAD, right dominant system, LVEF >55%, no MR or AS   Past Surgical History  Procedure Laterality Date  . Cardiac catheterization N/A 12/03/2014    Procedure: Left Heart Cath and Coronary Angiography;  Surgeon: Minna Merritts, MD;  Location: [facility];  Service: Cardiovascular;  Laterality: N/A;   Family History  Problem Relation Age of Onset  . Leukemia Mother    Social History  Substance Use Topics  . Smoking status: Former Smoker -- 1.00 packs/day for 20 years    Types: Cigarettes    Quit date: 08/30/2004  . Smokeless tobacco: Never Used  . Alcohol Use: No    Review of Systems  Constitutional: Negative for fever, chills, activity change and fatigue.  HENT: Positive for postnasal drip, rhinorrhea and sinus pressure. Negative for sore throat.   Respiratory: Negative for cough, shortness of breath, wheezing and stridor.   Cardiovascular: Positive for chest pain.  All other systems reviewed and are negative.   Allergies  Review of patient's allergies indicates no known allergies.  Home Medications   Prior to Admission medications   Medication Sig Start Date End Date Taking? Authorizing Provider  diltiazem (CARDIZEM) 30 MG tablet Take 1 tablet (30 mg total) by mouth 4 (four) times daily. Every 6 hours as needed for heart rate greater than 90 bpm 12/11/14  Yes Ryan M Dunn, PA-C  enalapril (VASOTEC) 20 MG tablet TAKE  1 TABLET BY MOUTH EVERY DAY 03/26/15  Yes Juline Patch, MD  hydrochlorothiazide (HYDRODIURIL) 25 MG tablet TAKE 1 TABLET BY MOUTH EVERY DAY 03/26/15  Yes Juline Patch, MD  hydrochlorothiazide (MICROZIDE) 12.5 MG capsule Take 1 capsule (12.5 mg total) by mouth daily. 12/11/14  Yes Ryan M Dunn, PA-C  meloxicam (MOBIC) 15 MG tablet Take 1 tablet (15 mg total) by mouth daily. 05/07/15  Yes Titorya Stover, DPM  metoprolol (LOPRESSOR) 50 MG tablet Take 1.5 tablets  (75 mg total) by mouth 2 (two) times daily. 08/31/14  Yes Ryan M Dunn, PA-C  predniSONE (DELTASONE) 5 MG tablet Take 5 mg by mouth daily.   Yes Historical Provider, MD  XARELTO 20 MG TABS tablet TAKE 1 TABLET (20 MG TOTAL) BY MOUTH EVERY MORNING. 05/17/15  Yes Minna Merritts, MD  azithromycin (ZITHROMAX Z-PAK) 250 MG tablet Use as per package instructions 06/10/15   Lorin Picket, PA-C  fluticasone (FLONASE) 50 MCG/ACT nasal spray Place 2 sprays into both nostrils daily. 06/10/15   Tatia Petrucci P Liberti App[place], PA-C  XARELTO 20 MG TABS tablet TAKE 1 TABLET BY MOUTH EVERY DAY 02/03/15   Juline Patch, MD   Meds Ordered and Administered this Visit  Medications - No data to display  BP 152/102 mmHg  Pulse 45  Temp(Src) 98.3 F (36.8 C) (Oral)  Resp 18  Ht 5\' 10"  (1.778 m)  Wt 224 lb (101.606 kg)  BMI 32.14 kg/m2  SpO2 97% No data found.   Physical Exam  Constitutional: He is oriented to person, place, and time. He appears well-developed and well-nourished. No distress.  HENT:  Head: Normocephalic and atraumatic.  Right Ear: External ear normal.  Left Ear: External ear normal.  Mouth/Throat: Oropharynx is clear and moist. No oropharyngeal exudate.  Examination of the face shows pain with percussion of his maxillary sinuses.  Eyes: Conjunctivae are normal. Pupils are equal, round, and reactive to light.  Neck: Normal range of motion. Neck supple.  Pulmonary/Chest: Effort normal. No stridor. No respiratory distress. He has no wheezes. He has rales.  Has bibasilar crackles which she states is normal for him because of the pulmonary fibrosis present "all the time".  Musculoskeletal: Normal range of motion. He exhibits no edema or tenderness.  Lymphadenopathy:    He has no cervical adenopathy.  Neurological: He is alert and oriented to person, place, and time.  Skin: Skin is warm and dry. He is not diaphoretic.  Psychiatric: He has a normal mood and affect. Judgment and thought content normal.   Nursing note and vitals reviewed.   ED Course  Procedures (including critical care time)  Labs Review Labs Reviewed - No data to display  Imaging Review No results found.   Visual Acuity Review  Right Eye Distance:   Left Eye Distance:   Bilateral Distance:    Right Eye Near:   Left Eye Near:    Bilateral Near:         MDM   1. Acute frontal sinusitis, recurrence not specified   .ed.  Discharge Medication List as of 06/10/2015  9:13 AM    START taking these medications   Details  azithromycin (ZITHROMAX Z-PAK) 250 MG tablet Use as per package instructions, Normal    fluticasone (FLONASE) 50 MCG/ACT nasal spray Place 2 sprays into both nostrils daily., Starting 06/10/2015, Until Discontinued, Normal      Plan: 1. Diagnosis reviewed with patient 2. rx as per orders; risks, benefits, potential side effects reviewed  with patient 3. Recommend supportive treatment with rest and fluids. As recommended that he "Flonase to use on a daily basis. For his other findings today he will follow-up with his cardiologist next week unless he begins to feel worse and that I recommended to the emergency department. 4. F/u prn if symptoms worsen or don't improve    Lorin Picket, PA-C 06/10/15 [phone]

## 2015-06-10 NOTE — ED Notes (Signed)
Patient started having sinus pressure pain and nasal drainage one week ago. OTC medications have not resolved symptoms. Patient has a low HR of 45.

## 2015-06-16 ENCOUNTER — Telehealth: Payer: Self-pay | Admitting: *Deleted

## 2015-06-16 ENCOUNTER — Other Ambulatory Visit: Payer: Self-pay | Admitting: Physician Assistant

## 2015-06-16 NOTE — Telephone Encounter (Signed)
Pt states his heel hurts and would like an appt.

## 2015-06-18 ENCOUNTER — Encounter: Payer: Self-pay | Admitting: Sports Medicine

## 2015-06-18 ENCOUNTER — Ambulatory Visit (INDEPENDENT_AMBULATORY_CARE_PROVIDER_SITE_OTHER): Payer: 59 | Admitting: Sports Medicine

## 2015-06-18 DIAGNOSIS — M7662 Achilles tendinitis, left leg: Secondary | ICD-10-CM | POA: Diagnosis not present

## 2015-06-18 DIAGNOSIS — M79672 Pain in left foot: Secondary | ICD-10-CM

## 2015-06-18 MED ORDER — MELOXICAM 15 MG PO TABS: 15.0000 mg | ORAL_TABLET | Freq: Every day | ORAL | Status: DC

## 2015-06-18 NOTE — Progress Notes (Signed)
Patient ID: [name], male   DOB: Apr 16, 1961, 55 y.o.   MRN: [id number]  Subjective: [name] is a 55 y.o. male patient who returns to office for follow up evaluation of Left foot pain status post injection 5 1/2 weeks ago. Patient complains states that he was doing good up until Tuesday; went to rheumatologist who was pressing on his heel and since then started to have increased pain so much so that he had to take off on work on Wednesday. Patient states that now pain is 4/10 has gotten better since Tuesday with ice, night splint, and heel lift of which he purchased OTC. Patient tolerated mobic and steroid dose pack with no issues. Patient denies any other pedal complaints.   Patient Active Problem List   Diagnosis Date Noted  . Normal coronary arteries   . Unstable angina - left-sided shoulder/chest pain occurring at rest 12/02/2014  . Chest pain 12/01/2014  . Typical atrial flutter ([place])   . 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 Essential hypertension   . RA (rheumatoid arthritis) ([place])   . Pulmonary fibrosis ([place])   . Atrial flutter [facility])    Current Outpatient Prescriptions on File Prior to Visit  Medication Sig Dispense Refill  . azithromycin (ZITHROMAX Z-PAK) 250 MG tablet Use as per package instructions 1 each 0  . diltiazem (CARDIZEM) 30 MG tablet Take 1 tablet (30 mg total) by mouth 4 (four) times daily. Every 6 hours as needed for heart rate greater than 90 bpm 30 tablet 0  . enalapril (VASOTEC) 20 MG tablet TAKE 1 TABLET BY MOUTH EVERY DAY 30 tablet 2  . fluticasone (FLONASE) 50 MCG/ACT nasal spray Place 2 sprays into both nostrils daily. 16 g 0  . hydrochlorothiazide (HYDRODIURIL) 25 MG tablet TAKE 1 TABLET BY MOUTH EVERY DAY 30 tablet 2  . hydrochlorothiazide (MICROZIDE) 12.5 MG capsule Take 1 capsule (12.5 mg total) by mouth daily. 90 capsule 1  . meloxicam (MOBIC) 15 MG tablet Take 1 tablet (15 mg total) by mouth daily. 30 tablet 0  .  metoprolol (LOPRESSOR) 50 MG tablet TAKE 1 AND 1/2 TABLETS BY MOUTH TWICE DAILY 90 tablet 6  . predniSONE (DELTASONE) 5 MG tablet Take 5 mg by mouth daily.    Alveda Reasons 20 MG TABS tablet TAKE 1 TABLET BY MOUTH EVERY DAY 60 tablet 0  . XARELTO 20 MG TABS tablet TAKE 1 TABLET (20 MG TOTAL) BY MOUTH EVERY MORNING. 30 tablet 3   Current Facility-Administered Medications on File Prior to Visit  Medication Dose Route Frequency Provider Last Rate Last Dose  . triamcinolone acetonide (KENALOG) 10 MG/ML injection 10 mg  10 mg Other Once [employer], DPM       No Known Allergies   Objective:  General: Alert and oriented x3 in no acute distress  Dermatology: No open lesions bilateral lower extremities, no webspace macerations, no ecchymosis bilateral, all nails x 10 are well manicured.  Vascular: Dorsalis Pedis and Posterior Tibial pedal pulses 2/4, Capillary Fill Time 3 seconds, + pedal hair growth bilateral, no edema bilateral lower extremities, Temperature gradient within normal limits.  Neurology: Gross sensation intact via light touch bilateral, Protective sensation intact  with Semmes Weinstein Monofilament to all pedal sites, Position sense intact, vibratory intact bilateral, Deep tendon reflexes within normal limits bilateral, No babinski sign present bilateral. -Tinels sign.   Musculoskeletal: Mild tenderness with palpation at the lateral insertion of the Achilles on Left, there is minimal calcaneal exostosis with mild  soft tissue buldge/fullness present, The achilles tendon feels intact with no nodularity or palpable dell, Thompson sign negative, Ankle, Subtalar, and midtarsal joint range of motion is within normal limits, there is no 1st ray hypermobility or forefoot deformity noted bilateral. Pes planus foot type.   Assessment and Plan: Problem List Items Addressed This Visit    None    Visit Diagnoses    Heel pain, left    -  Primary    Tendonitis, Achilles, left           -Complete examination performed -Discussed treatement options -Rx PT with treatment modalities at [facility] for Left achilles -Refilled Meloxicam -Cont with ice, night splint, and gentle stretching. -Cont with heel lift on left. -Advised patient to work to tolerance if need note to contact office. -Patient to return to office in 3-4 weeks or sooner if condition worsens. Landis Martins, DPM

## 2015-06-22 ENCOUNTER — Encounter: Payer: Self-pay | Admitting: Sports Medicine

## 2015-06-22 ENCOUNTER — Ambulatory Visit (INDEPENDENT_AMBULATORY_CARE_PROVIDER_SITE_OTHER): Payer: 59 | Admitting: Sports Medicine

## 2015-06-22 DIAGNOSIS — T148 Other injury of unspecified body region: Secondary | ICD-10-CM | POA: Diagnosis not present

## 2015-06-22 DIAGNOSIS — M79672 Pain in left foot: Secondary | ICD-10-CM

## 2015-06-22 DIAGNOSIS — IMO0002 Reserved for concepts with insufficient information to code with codable children: Secondary | ICD-10-CM

## 2015-06-22 NOTE — Progress Notes (Signed)
Patient ID: [name], male   DOB: 1961/05/13, 55 y.o.   MRN: [id number]  Subjective: [name] is a 55 y.o. male patient who returns to office for follow up evaluation of Left heel pain now with blister that started on yesterday after he left ice on heel for >1.5 hour. Patient states that after icing he noticed blister forming that is tender to touch. Denies constitutional symptoms. Patient denies any other pedal complaints.   Patient Active Problem List   Diagnosis Date Noted  . Normal coronary arteries   . Unstable angina - left-sided shoulder/chest pain occurring at rest 12/02/2014  . Chest pain 12/01/2014  . Typical atrial flutter ([place])   . 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 Essential hypertension   . RA (rheumatoid arthritis) ([place])   . Pulmonary fibrosis ([place])   . Atrial flutter [facility])    Current Outpatient Prescriptions on File Prior to Visit  Medication Sig Dispense Refill  . azithromycin (ZITHROMAX Z-PAK) 250 MG tablet Use as per package instructions 1 each 0  . diltiazem (CARDIZEM) 30 MG tablet Take 1 tablet (30 mg total) by mouth 4 (four) times daily. Every 6 hours as needed for heart rate greater than 90 bpm 30 tablet 0  . enalapril (VASOTEC) 20 MG tablet TAKE 1 TABLET BY MOUTH EVERY DAY 30 tablet 2  . fluticasone (FLONASE) 50 MCG/ACT nasal spray Place 2 sprays into both nostrils daily. 16 g 0  . hydrochlorothiazide (HYDRODIURIL) 25 MG tablet TAKE 1 TABLET BY MOUTH EVERY DAY 30 tablet 2  . hydrochlorothiazide (MICROZIDE) 12.5 MG capsule Take 1 capsule (12.5 mg total) by mouth daily. 90 capsule 1  . meloxicam (MOBIC) 15 MG tablet Take 1 tablet (15 mg total) by mouth daily. 30 tablet 0  . metoprolol (LOPRESSOR) 50 MG tablet TAKE 1 AND 1/2 TABLETS BY MOUTH TWICE DAILY 90 tablet 6  . predniSONE (DELTASONE) 5 MG tablet Take 5 mg by mouth daily.    Alveda Reasons 20 MG TABS tablet TAKE 1 TABLET BY MOUTH EVERY DAY 60 tablet 0  . XARELTO 20 MG TABS  tablet TAKE 1 TABLET (20 MG TOTAL) BY MOUTH EVERY MORNING. 30 tablet 3   Current Facility-Administered Medications on File Prior to Visit  Medication Dose Route Frequency Provider Last Rate Last Dose  . triamcinolone acetonide (KENALOG) 10 MG/ML injection 10 mg  10 mg Other Once [employer], DPM       No Known Allergies   Objective:  General: Alert and oriented x3 in no acute distress  Dermatology: 2 focal water blisters to left heel with no surrounding signs of infection, No open lesions bilateral lower extremities, no webspace macerations, no ecchymosis bilateral, all nails x 10 are well manicured.  Vascular: Dorsalis Pedis and Posterior Tibial pedal pulses 2/4, Capillary Fill Time 3 seconds, + pedal hair growth bilateral, no edema bilateral lower extremities, Temperature gradient within normal limits.  Neurology: Gross sensation intact via light touch bilateral. -Tinels sign.   Musculoskeletal: Mild tenderness with palpation at the lateral insertion of the Achilles and at blister sites on Left heel, there is minimal calcaneal exostosis with mild soft tissue buldge/fullness present, The achilles tendon feels intact with no nodularity or palpable dell, Thompson sign negative, Ankle, Subtalar, and midtarsal joint range of motion is within normal limits, there is no 1st ray hypermobility or forefoot deformity noted bilateral. Pes planus foot type.   Assessment and Plan: Problem List Items Addressed This Visit    None  Visit Diagnoses    Heel pain, left    -  Primary    Blister of skin without infection        secondary to icing       -Complete examination performed. -Discussed treatement options. -Using as sterile chisel blade incised and drained water blisters at left heel and applied topical antibiotic cream. Advised patient to refrain from icing area until heel gets better. Advised to monitor for signs of infection if noted to return to office immediately or go to ER; Patient  to soak in Epsom salt and dress areas daily with neosporin and band-aid during day, open to air at night until resolves.  -Cont with PT with treatment modalities at [facility] for Left achilles. -Cont with Meloxicam -Cont with night splint, and gentle stretching. -Cont with heel lift on left. -Advised patient to cont with work to tolerance if need note to contact office. -Patient to return to office in 3 weeks as scheduled or sooner if condition worsens. Landis Martins, DPM

## 2015-06-24 ENCOUNTER — Other Ambulatory Visit: Payer: Self-pay | Admitting: Family Medicine

## 2015-06-25 ENCOUNTER — Telehealth: Payer: Self-pay | Admitting: Sports Medicine

## 2015-06-25 NOTE — Telephone Encounter (Signed)
I gave the patient a Rx to take with him to [facility]. I will have the office to fax a new Rx over to them after lunch. Thanks Dr. Cannon Kettle

## 2015-06-25 NOTE — Telephone Encounter (Signed)
Pt is at [facility] physical therapy for his first treatment and they did not get the orders. Could you please fax orders to them. The fax # is [phone] or [phone]

## 2015-07-07 ENCOUNTER — Other Ambulatory Visit: Payer: Self-pay | Admitting: Family Medicine

## 2015-07-09 ENCOUNTER — Ambulatory Visit (INDEPENDENT_AMBULATORY_CARE_PROVIDER_SITE_OTHER): Payer: 59 | Admitting: Sports Medicine

## 2015-07-09 DIAGNOSIS — M79672 Pain in left foot: Secondary | ICD-10-CM | POA: Diagnosis not present

## 2015-07-09 DIAGNOSIS — T148XXA Other injury of unspecified body region, initial encounter: Secondary | ICD-10-CM

## 2015-07-09 DIAGNOSIS — T148 Other injury of unspecified body region: Secondary | ICD-10-CM | POA: Diagnosis not present

## 2015-07-09 DIAGNOSIS — M779 Enthesopathy, unspecified: Secondary | ICD-10-CM | POA: Diagnosis not present

## 2015-07-09 DIAGNOSIS — M7662 Achilles tendinitis, left leg: Secondary | ICD-10-CM

## 2015-07-09 MED ORDER — TRIAMCINOLONE ACETONIDE 10 MG/ML IJ SUSP: 10.0000 mg | Freq: Once | INTRAMUSCULAR | Status: DC

## 2015-07-09 NOTE — Progress Notes (Signed)
Patient ID: [name], male   DOB: [birth year], 55 y.o.   MRN: [id number]  Subjective: [name] is a 55 y.o. male patient who returns to office for new pain on the top of the left foot that started after doing a lot of walking at work in shoes that was rubbing top of foot. Patient is also following up for evaluation of Left heel blister secondary to thermal burn from icing. Patient denies any other pedal complaints.   Patient Active Problem List   Diagnosis Date Noted  . Normal coronary arteries   . Unstable angina - left-sided shoulder/chest pain occurring at rest 12/02/2014  . Chest pain 12/01/2014  . Typical atrial flutter ([place])   . 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 Essential hypertension   . RA (rheumatoid arthritis) ([place])   . Pulmonary fibrosis ([place])   . Atrial flutter [facility])    Current Outpatient Prescriptions on File Prior to Visit  Medication Sig Dispense Refill  . azithromycin (ZITHROMAX Z-PAK) 250 MG tablet Use as per package instructions 1 each 0  . diltiazem (CARDIZEM) 30 MG tablet Take 1 tablet (30 mg total) by mouth 4 (four) times daily. Every 6 hours as needed for heart rate greater than 90 bpm 30 tablet 0  . enalapril (VASOTEC) 20 MG tablet TAKE 1 TABLET BY MOUTH EVERY DAY 30 tablet 2  . fluticasone (FLONASE) 50 MCG/ACT nasal spray Place 2 sprays into both nostrils daily. 16 g 0  . hydrochlorothiazide (HYDRODIURIL) 25 MG tablet TAKE 1 TABLET BY MOUTH EVERY DAY 30 tablet 2  . hydrochlorothiazide (MICROZIDE) 12.5 MG capsule Take 1 capsule (12.5 mg total) by mouth daily. 90 capsule 1  . meloxicam (MOBIC) 15 MG tablet Take 1 tablet (15 mg total) by mouth daily. 30 tablet 0  . metoprolol (LOPRESSOR) 50 MG tablet TAKE 1 AND 1/2 TABLETS BY MOUTH TWICE DAILY 90 tablet 6  . predniSONE (DELTASONE) 5 MG tablet Take 5 mg by mouth daily.    . VENTOLIN HFA 108 (90 Base) MCG/ACT inhaler INHALE 2 PUFFS BY MOUTH 4 TIMES DAILY 18 Inhaler 0  . XARELTO 20  MG TABS tablet TAKE 1 TABLET BY MOUTH EVERY DAY 60 tablet 0  . XARELTO 20 MG TABS tablet TAKE 1 TABLET (20 MG TOTAL) BY MOUTH EVERY MORNING. 30 tablet 3   Current Facility-Administered Medications on File Prior to Visit  Medication Dose Route Frequency Provider Last Rate Last Dose  . triamcinolone acetonide (KENALOG) 10 MG/ML injection 10 mg  10 mg Other Once [employer], DPM       No Known Allergies   Objective:  General: Alert and oriented x3 in no acute distress  Dermatology: Blisters to left heel are now superfical abrasions  with no surrounding signs of infection, No open lesions bilateral lower extremities, no webspace macerations, no ecchymosis bilateral, all nails x 10 are well manicured.  Vascular: Dorsalis Pedis and Posterior Tibial pedal pulses 2/4, Capillary Fill Time 3 seconds, + pedal hair growth bilateral, no edema bilateral lower extremities, Temperature gradient within normal limits.  Neurology: Gross sensation intact via light touch bilateral. -Tinels sign.   Musculoskeletal: Mild tenderness to palpation to left dorsal midfoot with area of soft tissue swelling focal to site, decreased tenderness with palpation at the lateral insertion of the Achilles and at abrasion sites on Left heel, there is minimal calcaneal exostosis with mild soft tissue buldge/fullness present, The achilles tendon feels intact with no nodularity or palpable dell, Thompson sign remains  negative, Ankle, Subtalar, and midtarsal joint range of motion is within normal limits except at left where there is mild pain, there is no 1st ray hypermobility or forefoot deformity noted bilateral. Pes planus foot type.   Assessment and Plan: Problem List Items Addressed This Visit    None    Visit Diagnoses    Capsulitis    -  Primary    Relevant Medications    triamcinolone acetonide (KENALOG) 10 MG/ML injection 10 mg    Heel pain, left        Tendonitis, Achilles, left        Abrasion           -Complete examination performed. -Discussed treatement options. -After oral consent and aseptic prep, injected a mixture containing 1 ml of 2%  plain lidocaine, 1 ml 0.5% plain marcaine, 0.5 ml of kenalog 10 and 0.5 ml of dexamethasone phosphate into left dorsal midfoot. Post-injection care discussed with patient.  -Applied neosporin to abrasion sites at left heel secured with coban to keep intact for today; continue with topical antibiotic as needed daily. -Refrain from icing directly to left heel until abrasion sites heal.  -Recommend good supportive shoes daily  -Cont with PT with treatment modalities at [facility] for Left achilles. -Cont with Meloxicam until complete  -Cont with night splint, and gentle stretching. -Cont with heel lift on left. -Advised patient to cont with work to tolerance if need work note to contact office. -Patient to return to office in 3 weeks as scheduled or sooner if condition worsens. Landis Martins, DPM

## 2015-07-15 ENCOUNTER — Other Ambulatory Visit: Payer: Self-pay | Admitting: Sports Medicine

## 2015-07-30 ENCOUNTER — Ambulatory Visit: Payer: 59 | Admitting: Sports Medicine

## 2015-08-12 ENCOUNTER — Ambulatory Visit (INDEPENDENT_AMBULATORY_CARE_PROVIDER_SITE_OTHER): Payer: 59 | Admitting: Cardiovascular Disease

## 2015-09-08 ENCOUNTER — Other Ambulatory Visit: Payer: Self-pay | Admitting: Family Medicine

## 2015-09-24 ENCOUNTER — Other Ambulatory Visit: Payer: Self-pay | Admitting: Family Medicine

## 2015-09-24 ENCOUNTER — Other Ambulatory Visit: Payer: Self-pay | Admitting: Cardiovascular Disease

## 2015-10-01 ENCOUNTER — Encounter: Payer: Self-pay | Admitting: Cardiovascular Disease

## 2015-10-01 ENCOUNTER — Ambulatory Visit (INDEPENDENT_AMBULATORY_CARE_PROVIDER_SITE_OTHER): Payer: 59 | Admitting: Cardiovascular Disease

## 2015-10-01 VITALS — BP 140/100 | HR 72 | Ht 70.0 in | Wt 217.2 lb

## 2015-10-01 DIAGNOSIS — I428 Other cardiomyopathies: Secondary | ICD-10-CM

## 2015-10-01 DIAGNOSIS — I10 Essential (primary) hypertension: Secondary | ICD-10-CM | POA: Diagnosis not present

## 2015-10-01 DIAGNOSIS — I493 Ventricular premature depolarization: Secondary | ICD-10-CM

## 2015-10-01 DIAGNOSIS — R000 Tachycardia, unspecified: Secondary | ICD-10-CM

## 2015-10-01 DIAGNOSIS — I4892 Unspecified atrial flutter: Secondary | ICD-10-CM | POA: Diagnosis not present

## 2015-10-01 DIAGNOSIS — I483 Typical atrial flutter: Secondary | ICD-10-CM | POA: Diagnosis not present

## 2015-10-01 DIAGNOSIS — J8410 Pulmonary fibrosis, unspecified: Secondary | ICD-10-CM

## 2015-10-01 DIAGNOSIS — I43 Cardiomyopathy in diseases classified elsewhere: Secondary | ICD-10-CM

## 2015-10-01 HISTORY — DX: Ventricular premature depolarization: I49.3

## 2015-10-01 NOTE — Assessment & Plan Note (Signed)
Blood pressure mildly elevated on today's visit, recommended he buy a blood pressure cuff, check his blood pressures at home and call our office with numbers.

## 2015-10-01 NOTE — Assessment & Plan Note (Signed)
APCs and PVCs appreciated on exam and on EKG today Previously seen on 30 day monitor, Etiology of this was discussed with him in detail, no significant cause for concern   Total encounter time more than 25 minutes  Greater than 50% was spent in counseling and coordination of care with the patient

## 2015-10-01 NOTE — Assessment & Plan Note (Signed)
Previous echocardiogram with normal ejection fraction, clinically appears to be doing well No changes to his medications

## 2015-10-01 NOTE — Progress Notes (Signed)
Patient ID: [name], male    DOB: [birth year], 55 y.o.   MRN: [id number]  HPI Comments: 55 year old male with history of atrial flutter s/p TEE/DCCV in April 2016 on Xarelto, history of tachycardia-induced cardiomyopathy with EF as low as 30-35%  improved to 55-60% on follow up echo, pulmonary fibrosis not on home oxygen, followed by Dr. Raul Del, HTN, and rheumatoid arthritis,  recent admission to [facility] from 7/5-12/03/14  for atypical chest pain in the setting of recent palpitations, cardiac catheterization showing no significant CAD who presents for routine follow-up to the [facility] office for follow-up of his atrial flutter  In follow-up today, he denies any chest pain concerning for angina Reports breathing is stable, continues to follow-up with Dr. Raul Del Does not appreciate any significant palpitations, Does not check his blood pressure at home Prior lipid panel in early 2016 with cholesterol in the 80s, not on a statin Reports he is taking diltiazem 30 mg twice a day, takes metoprolol 75 mg twice a day. No problems with blood thinners. Overall no new complaints.  EKG on today's visit shows sinus rhythm with rate 72 bpm, poor R-wave progression to the anterior precordial leads, left anterior fascicular block  Other past medical history Previous 30 day monitor that showed 3 episodes of PVCs in a bigeminal manner. He was asymptomatic. Arrhythmia sometimes in the morning, other times in the early evening. Was not on a consistent basis.   admitted to [facility] in April of 2016 for 3 week history of increased dyspnea and palpitations and found to be in new onset atrial flutter.  TTE showed EF 20-25%, global HK, possible bicuspid aortic valve.   successful TEE/DCCV on 4/1. 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   Repeat TTE  showed improved EF of 55-60%, select images  suggestive of hypokinesis of the inferior and posterior myocardium. LV diastolic parameters were normal. Left atrium was normal in size. RV function was normal. PASP was normal. He was advised further work up if he has symptoms.   On 11/27/14 while at work he suddenly developed onset of palpitations that led to increased SOB, especially with ambulation causing patient to leave work early. Patient checked his pulse and found it to be in the 140's.Pulse went back into the 60's to 80's without intervention.  Recurrent chest pain 7/5 he was woken up around 2 AM with left shoulder pain that radiated to his left chest.  He presented to [facility] for evaluation. Troponin was found to be mildly elevated and flated trending 0.08-->0.05-->0.06-->0.07.   He underwent cardiac cath 48 hours which showed right dominant system, no significant CAD, normal LV gram with EF >55%    No Known Allergies  Current Outpatient Prescriptions on File Prior to Visit  Medication Sig Dispense Refill  . diltiazem (CARDIZEM) 30 MG tablet Take 1 tablet (30 mg total) by mouth 4 (four) times daily. Every 6 hours as needed for heart rate greater than 90 bpm 30 tablet 0  . enalapril (VASOTEC) 20 MG tablet TAKE 1 TABLET BY MOUTH EVERY DAY 30 tablet 0  . fluticasone (FLONASE) 50 MCG/ACT nasal spray Place 2 sprays into both nostrils daily. 16 g 0  . hydrochlorothiazide (MICROZIDE) 12.5 MG capsule Take 1 capsule (12.5 mg total) by mouth daily. 90 capsule 1  . meloxicam (MOBIC) 15 MG tablet Take 1 tablet (15 mg total) by mouth daily. 30 tablet  0  . metoprolol (LOPRESSOR) 50 MG tablet TAKE 1 AND 1/2 TABLETS BY MOUTH TWICE DAILY 90 tablet 6  . predniSONE (DELTASONE) 5 MG tablet Take 5 mg by mouth daily.    . VENTOLIN HFA 108 (90 Base) MCG/ACT inhaler INHALE 2 PUFFS BY MOUTH 4 TIMES DAILY 18 Inhaler 0  . XARELTO 20 MG TABS tablet TAKE 1 TABLET (20 MG TOTAL) BY MOUTH EVERY MORNING. 30 tablet 3   Current Facility-Administered Medications on File  Prior to Visit  Medication Dose Route Frequency Provider Last Rate Last Dose  . triamcinolone acetonide (KENALOG) 10 MG/ML injection 10 mg  10 mg Other Once [employer], DPM      . triamcinolone acetonide (KENALOG) 10 MG/ML injection 10 mg  10 mg Other Once Landis Martins, DPM        Past Medical History  Diagnosis Date  . Atrial flutter ([place])     a. on Xarelto; b. s/p successful TEE/DCCV on 08/28/2014  . Pulmonary fibrosis ([place])     a. not on home oxygen  . RA (rheumatoid arthritis) ([place])   . HTN (hypertension)   . 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 echo 07/2014: EF 20-25%, cannot exclude atrial flutter,  global HK, possible bicuspid Ao valve, mod reduced RV sys fxn, mild-mod aortic valve sclerosis/calcification, mod TR, mildly elevated RVSP; b. TEE 08/28/2014: EF 30-35%, no intracardic thrombus, mildly dilated LA/RA, mild TR, mild-mod aortic sclerosis w/o stenosis; c. echo 09/2014: EF 55-60%, select images w/ inf HK  . History of nuclear stress test     a. 09/02/2014: no sig ischemia, GI uptake noted, no sig WMA, EF 48%, no EKG chanes concerning for ischemia, low risk scan    . Normal coronary arteries     a. cardiac cath 12/03/2014: no sigificant CAD, right dominant system, LVEF >55%, no MR or AS    Past Surgical History  Procedure Laterality Date  . Cardiac catheterization N/A 12/03/2014    Procedure: Left Heart Cath and Coronary Angiography;  Surgeon: Minna Merritts, MD;  Location: [facility];  Service: Cardiovascular;  Laterality: N/A;    Social History  reports that he quit smoking about 11 years ago. His smoking use included Cigarettes. He has a 20 pack-year smoking history. He has never used smokeless tobacco. He reports that he does not drink alcohol or use illicit drugs.  Family History family history includes Leukemia in his mother.   Review of Systems  Constitutional: Negative.   Respiratory: Positive for shortness of breath.   Cardiovascular:  Negative.   Gastrointestinal: Negative.   Musculoskeletal: Negative.   Allergic/Immunologic: Negative.   Neurological: Negative.   Hematological: Negative.   Psychiatric/Behavioral: Negative.   All other systems reviewed and are negative.   BP 140/100 mmHg  Pulse 72  Ht 5\' 10"  (1.778 m)  Wt 217 lb 4 oz (98.544 kg)  BMI 31.17 kg/m2  Physical Exam  Constitutional: He is oriented to person, place, and time. He appears well-developed and well-nourished.  HENT:  Head: Normocephalic.  Nose: Nose normal.  Mouth/Throat: Oropharynx is clear and moist.  Eyes: Conjunctivae are normal. Pupils are equal, round, and reactive to light.  Neck: Normal range of motion. Neck supple. No JVD present.  Cardiovascular: Normal rate, regular rhythm, S1 normal, S2 normal, normal heart sounds and intact distal pulses.  Frequent extrasystoles are present. Exam reveals no gallop and no friction rub.   No murmur heard. Pulmonary/Chest: Effort normal. No respiratory distress. He has  no wheezes. He has rales. He exhibits no tenderness.  Abdominal: Soft. Bowel sounds are normal. He exhibits no distension. There is no tenderness.  Musculoskeletal: Normal range of motion. He exhibits no edema or tenderness.  Lymphadenopathy:    He has no cervical adenopathy.  Neurological: He is alert and oriented to person, place, and time. Coordination normal.  Skin: Skin is warm and dry. No rash noted. No erythema.  Psychiatric: He has a normal mood and affect. His behavior is normal. Judgment and thought content normal.      Assessment and Plan   Nursing note and vitals reviewed.

## 2015-10-01 NOTE — Assessment & Plan Note (Signed)
Followed by Dr. Raul Del Reports symptoms are stable Recommended a regular exercise program for conditioning

## 2015-10-01 NOTE — Patient Instructions (Addendum)
You are doing well. No medication changes were made.  Please monitor blood pressure at home Call if blood pressure runs high Goal is <140 on the top <90 on the bottom  Please call us if you have new issues that need to be addressed before your next appt.  Your physician wants you to follow-up in: 12 months.  You will receive a reminder letter in the mail two months in advance. If you don't receive a letter, please call our office to schedule the follow-up appointment.

## 2015-10-01 NOTE — Assessment & Plan Note (Signed)
Denies having any further episodes of atrial flutter Discussed anticoagulation with him, it is not costing him anything, he prefers to stay on anticoagulation at this time. Unclear if he was Symptomatic on his last episode apart from shortness of breath.

## 2015-10-24 ENCOUNTER — Other Ambulatory Visit: Payer: Self-pay | Admitting: Family Medicine

## 2015-10-26 ENCOUNTER — Other Ambulatory Visit: Payer: Self-pay | Admitting: Cardiovascular Disease

## 2015-10-28 DIAGNOSIS — D649 Anemia, unspecified: Secondary | ICD-10-CM

## 2015-10-28 HISTORY — DX: Anemia, unspecified: D64.9

## 2015-11-04 ENCOUNTER — Encounter: Payer: 59 | Attending: Surgery | Admitting: Surgery

## 2015-11-04 ENCOUNTER — Telehealth: Payer: Self-pay | Admitting: Cardiovascular Disease

## 2015-11-04 ENCOUNTER — Ambulatory Visit (HOSPITAL_COMMUNITY)
Admission: RE | Admit: 2015-11-04 | Discharge: 2015-11-04 | Disposition: A | Discharge status: Home / Self Care | Payer: 59 | Source: Ambulatory Visit | Attending: Cardiovascular Disease | Admitting: Cardiovascular Disease

## 2015-11-04 ENCOUNTER — Other Ambulatory Visit: Payer: Self-pay

## 2015-11-04 DIAGNOSIS — S65507A Unspecified injury of blood vessel of left little finger, initial encounter: Secondary | ICD-10-CM | POA: Diagnosis not present

## 2015-11-04 DIAGNOSIS — I10 Essential (primary) hypertension: Secondary | ICD-10-CM | POA: Diagnosis not present

## 2015-11-04 DIAGNOSIS — Z87891 Personal history of nicotine dependence: Secondary | ICD-10-CM | POA: Insufficient documentation

## 2015-11-04 DIAGNOSIS — I4892 Unspecified atrial flutter: Secondary | ICD-10-CM | POA: Insufficient documentation

## 2015-11-04 DIAGNOSIS — I7301 Raynaud's syndrome with gangrene: Secondary | ICD-10-CM | POA: Diagnosis not present

## 2015-11-04 DIAGNOSIS — S61207A Unspecified open wound of left little finger without damage to nail, initial encounter: Secondary | ICD-10-CM | POA: Insufficient documentation

## 2015-11-04 DIAGNOSIS — M069 Rheumatoid arthritis, unspecified: Secondary | ICD-10-CM | POA: Diagnosis not present

## 2015-11-04 DIAGNOSIS — I96 Gangrene, not elsewhere classified: Secondary | ICD-10-CM | POA: Insufficient documentation

## 2015-11-04 DIAGNOSIS — J8410 Pulmonary fibrosis, unspecified: Secondary | ICD-10-CM | POA: Insufficient documentation

## 2015-11-04 DIAGNOSIS — X58XXXA Exposure to other specified factors, initial encounter: Secondary | ICD-10-CM | POA: Insufficient documentation

## 2015-11-04 DIAGNOSIS — Z7901 Long term (current) use of anticoagulants: Secondary | ICD-10-CM | POA: Diagnosis not present

## 2015-11-04 DIAGNOSIS — I429 Cardiomyopathy, unspecified: Secondary | ICD-10-CM | POA: Insufficient documentation

## 2015-11-04 DIAGNOSIS — M329 Systemic lupus erythematosus, unspecified: Secondary | ICD-10-CM | POA: Diagnosis not present

## 2015-11-04 DIAGNOSIS — J449 Chronic obstructive pulmonary disease, unspecified: Secondary | ICD-10-CM | POA: Insufficient documentation

## 2015-11-04 NOTE — Progress Notes (Signed)
VASCULAR LAB PRELIMINARY  PRELIMINARY  PRELIMINARY  PRELIMINARY  Left upper extremity arterial duplex completed.    Preliminary report:  The ulnar artery appears small with atypical waveform. Subclavian, axillary, brachial, and radial arteries of the left upper extremity appear patent with normal waveforms. Arterial flow noted in left palmar arch.  Davonne Baby, RVT 11/04/2015, 2:42 PM

## 2015-11-04 NOTE — Progress Notes (Addendum)
[name] ([id number]) Visit Report for 11/04/2015 Chief Complaint Document Details Patient Name: [name] Date of Service: 11/04/2015 8:00 AM Medical Record Number: [id number] Patient Account Number: [id number] Date of Birth/Sex: 04-15-1961 (55 y.o. Male) Treating RN: [name] Primary Care Physician: [name] Other Clinician: Referring Physician: Ree Griffin Treating Physician/Extender: [name] in Treatment: 0 Information Obtained from: Patient Chief Complaint Patient presents to the wound care center for a consult due non healing wound to the pulp of the left little finger for about 3 months Electronic Signature(s) Signed: 11/04/2015 9:23:07 AM By: Devin Fudge MD, FACS Entered By: [name] on 11/04/2015 09:23:06 [name] ([id number]) -------------------------------------------------------------------------------- HPI Details Patient Name: [name] Date of Service: 11/04/2015 8:00 AM Medical Record Number: [id number] Patient Account Number: [id number] Date of Birth/Sex: [birth year] (55 y.o. Male) Treating RN: [name] Primary Care Physician: [name] Other Clinician: Referring Physician: Ree Griffin Treating Physician/Extender: [name] in Treatment: 0 History of Present Illness Location: left little finger wound Quality: Patient reports experiencing a dull pain to affected area(s). Severity: Patient states wound are getting worse. Duration: Patient has had the wound for > 3 months prior to seeking treatment at the wound center Timing: Pain in wound is Intermittent (comes and goes Context: The wound appeared gradually over time Modifying Factors: Other treatment(s) tried include:dermatology well been giving him some steroid creams Associated Signs and Symptoms: Patient reports having:intolerance to cold and the fingers turn blue when he is out in the open HPI Description:  55 year old male with a past medical history of atrial flutter on Xarelto and status post successful TEE/DCCV, pulmonary fibrosis, rheumatoid arthritis, hypertension, cardiomyopathy with ejection fraction now increased to 55-60%, cardiac cath with normal coronary arteries. patient quit smoking 11 years ago and does not drink alcohol. His problem started about 3 months ago where he noticed that he started getting a wound on the left little finger in the pulp of it laterally and this was associated with the fingers turning blue when he is in cold weather and a dull pain throughout. Findings are typical of her Raynaud's syndrome. Electronic Signature(s) Signed: 11/04/2015 9:46:56 AM By: Devin Fudge MD, FACS Previous Signature: 11/04/2015 8:11:39 AM Version By: Devin Fudge MD, FACS Entered By: [name] on 11/04/2015 09:46:56 [name] ([id number]) -------------------------------------------------------------------------------- Physical Exam Details Patient Name: [name] Date of Service: 11/04/2015 8:00 AM Medical Record Number: [id number] Patient Account Number: [id number] Date of Birth/Sex: September 16, 1960 (55 y.o. Male) Treating RN: [name] Primary Care Physician: [name] Other Clinician: Referring Physician: Ree Griffin Treating Physician/Extender: [name] in Treatment: 0 Constitutional . Pulse regular. Respirations normal and unlabored. Afebrile. . Eyes Nonicteric. Reactive to light. Ears, Nose, Mouth, and Throat Lips, teeth, and gums WNL.[name] Moist mucosa without lesions. Neck supple and nontender. No palpable supraclavicular or cervical adenopathy. Normal sized without goiter. Respiratory WNL. No retractions.. Cardiovascular Pedal Pulses WNL. his left radial pulses well palpable but the ulna pulses not so strong.. No clubbing, cyanosis or edema. Chest Breasts symmetical and no nipple discharge.. Breast tissue WNL, no masses, lumps,  or tenderness.. Gastrointestinal (GI) Abdomen without masses or tenderness.. No liver or spleen enlargement or tenderness.. Lymphatic No adneopathy. No adenopathy. No adenopathy. Musculoskeletal Adexa without tenderness or enlargement.. Digits and nails w/o clubbing, cyanosis, infection, petechiae, ischemia, or inflammatory conditions.. Integumentary (Hair, Skin) No suspicious lesions. No crepitus or fluctuance. No peri-wound warmth or erythema. No masses.[name] Psychiatric Judgement and insight Intact.. No evidence of  depression, anxiety, or agitation.. Notes on the pulp of the left little finger medially he has a patch of dry gangrene which is demarcating well. There is no cellulitis or inflammation and no involvement of the nail bed. Electronic Signature(s) Signed: 11/04/2015 9:47:46 AM By: Devin Fudge MD, FACS Entered By: [name] on 11/04/2015 09:47:45 [name] ([id number]) [name] ([id number]) -------------------------------------------------------------------------------- Physician Orders Details Patient Name: [name] Date of Service: 11/04/2015 8:00 AM Medical Record Number: [id number] Patient Account Number: [id number] Date of Birth/Sex: 09-17-1960 (55 y.o. Male) Treating RN: [name] Primary Care Physician: [name] Other Clinician: Referring Physician: Ree Griffin Treating Physician/Extender: [name] in Treatment: 0 Verbal / Phone Orders: Yes Clinician: Cornell Griffin Read Back and Verified: Yes Diagnosis Coding Wound Cleansing Wound #1 Left,Distal Hand - 5th Digit o Clean wound with Normal Saline. Anesthetic Wound #1 Left,Distal Hand - 5th Digit o Topical Lidocaine 4% cream applied to wound bed prior to debridement Primary Wound Dressing Wound #1 Left,Distal Hand - 5th Digit o Other: - Betadine and self adherent dressing Dressing Change Frequency Wound #1 Left,Distal Hand - 5th Digit o Change  dressing every day. Follow-up Appointments Wound #1 Left,Distal Hand - 5th Digit o Return Appointment in 1 week. Consults o Vascular - Left upper arterial duplex study Electronic Signature(s) Signed: 11/04/2015 3:49:49 PM By: Devin Fudge MD, FACS Signed: 11/05/2015 4:33:28 PM By: Gretta Cool RN, BSN, Kim RN, BSN Entered By: Gretta Cool, RN, BSN, Kim on 11/04/2015 08:49:12 Austill, [name] ([id number]) -------------------------------------------------------------------------------- Problem List Details Patient Name: [name] Date of Service: 11/04/2015 8:00 AM Medical Record Number: [id number] Patient Account Number: [id number] Date of Birth/Sex: May 09, 1961 (55 y.o. Male) Treating RN: [name] Primary Care Physician: [name] Other Clinician: Referring Physician: Ree Griffin Treating Physician/Extender: [name] in Treatment: 0 Active Problems ICD-10 Encounter Code Description Active Date Diagnosis S61.207A Unspecified open wound of left little finger without 11/04/2015 Yes damage to nail, initial encounter S65.507A Unspecified injury of blood vessel of left little finger, initial 11/04/2015 Yes encounter I73.01 Raynaud's syndrome with gangrene 11/04/2015 Yes Inactive Problems Resolved Problems Electronic Signature(s) Signed: 11/04/2015 9:22:48 AM By: Devin Fudge MD, FACS Entered By: [name] on 11/04/2015 09:22:47 Pavlock, [name] ([id number]) -------------------------------------------------------------------------------- Progress Note Details Patient Name: [name] Date of Service: 11/04/2015 8:00 AM Medical Record Number: [id number] Patient Account Number: [id number] Date of Birth/Sex: 02-19-1961 (55 y.o. Male) Treating RN: [name] Primary Care Physician: [name] Other Clinician: Referring Physician: Ree Griffin Treating Physician/Extender: [name] in Treatment: 0 Subjective Chief  Complaint Information obtained from Patient Patient presents to the wound care center for a consult due non healing wound to the pulp of the left little finger for about 3 months History of Present Illness (HPI) The following HPI elements were documented for the patient's wound: Location: left little finger wound Quality: Patient reports experiencing a dull pain to affected area(s). Severity: Patient states wound are getting worse. Duration: Patient has had the wound for > 3 months prior to seeking treatment at the wound center Timing: Pain in wound is Intermittent (comes and goes Context: The wound appeared gradually over time Modifying Factors: Other treatment(s) tried include:dermatology well been giving him some steroid creams Associated Signs and Symptoms: Patient reports having:intolerance to cold and the fingers turn blue when he is out in the open 55 year old male with a past medical history of atrial flutter on Xarelto and status post successful TEE/DCCV, pulmonary fibrosis, rheumatoid arthritis, hypertension, cardiomyopathy  with ejection fraction now increased to 55-60%, cardiac cath with normal coronary arteries. patient quit smoking 11 years ago and does not drink alcohol. His problem started about 3 months ago where he noticed that he started getting a wound on the left little finger in the pulp of it laterally and this was associated with the fingers turning blue when he is in cold weather and a dull pain throughout. Findings are typical of her Raynaud's syndrome. Wound History Patient presents with 1 open wound that has been present for approximately 3 months. Patient has been treating wound in the following manner: cream prescribed by derm. The wound has been healed in the past but has re-opened. Laboratory tests have not been performed in the last month. Patient reportedly has not tested positive for an antibiotic resistant organism. Patient reportedly has not tested  positive for osteomyelitis. Patient reportedly has not had testing performed to evaluate circulation in the legs. Patient History Information obtained from Patient. Allergies Clay, FLINT ETTER ([id number]) No Known Drug Allergies Family History Cancer - Mother, Hypertension - Father, Mother, No family history of Diabetes, Heart Disease, Kidney Disease, Lung Disease, Seizures, Stroke, Thyroid Problems. Social History Never smoker - 18 plus years, Marital Status - Married, Alcohol Use - Rarely, Drug Use - No History, Caffeine Use - Rarely. Medical History Eyes Denies history of Cataracts, Glaucoma, Optic Neuritis Ear/Nose/Mouth/Throat Denies history of Chronic sinus problems/congestion, Middle ear problems Hematologic/Lymphatic Denies history of Anemia, Hemophilia, Human Immunodeficiency Virus, Lymphedema, Sickle Cell Disease Respiratory Patient has history of Chronic Obstructive Pulmonary Disease (COPD) Denies history of Aspiration, Asthma, Pneumothorax, Sleep Apnea, Tuberculosis Cardiovascular Patient has history of Hypertension Denies history of Angina, Arrhythmia, Congestive Heart Failure, Coronary Artery Disease, Deep Vein Thrombosis, Hypotension, Myocardial Infarction, Peripheral Arterial Disease, Peripheral Venous Disease, Phlebitis, Vasculitis Gastrointestinal Denies history of Cirrhosis , Colitis, Crohn s, Hepatitis A, Hepatitis B, Hepatitis C Endocrine Denies history of Type I Diabetes, Type II Diabetes Genitourinary Denies history of End Stage Renal Disease Immunological Patient has history of Lupus Erythematosus - Skin lupus Denies history of Raynaud s, Scleroderma Musculoskeletal Patient has history of Rheumatoid Arthritis Denies history of Gout, Osteoarthritis, Osteomyelitis Neurologic Denies history of Dementia, Neuropathy, Quadriplegia, Paraplegia, Seizure Disorder Oncologic Denies history of Received Chemotherapy, Received Radiation Psychiatric Denies  history of Anorexia/bulimia, Confinement Anxiety Review of Systems (ROS) Constitutional Symptoms (General Health) The patient has no complaints or symptoms. Eyes The patient has no complaints or symptoms. Dahlem, [name] ([id number]) Ear/Nose/Mouth/Throat The patient has no complaints or symptoms. Hematologic/Lymphatic The patient has no complaints or symptoms. Respiratory The patient has no complaints or symptoms. Cardiovascular The patient has no complaints or symptoms. Gastrointestinal The patient has no complaints or symptoms. Endocrine The patient has no complaints or symptoms. Genitourinary The patient has no complaints or symptoms. Immunological The patient has no complaints or symptoms. Integumentary (Skin) Complains or has symptoms of Wounds, Bleeding or bruising tendency. Musculoskeletal The patient has no complaints or symptoms. Neurologic The patient has no complaints or symptoms. Oncologic The patient has no complaints or symptoms. Psychiatric The patient has no complaints or symptoms. Medications enalapril maleate 20 mg tablet oral 1 1 tablet oral daily Ventolin HFA 90 mcg/actuation aerosol inhaler inhalation 2 2 HFA aerosol inhaler inhalations four times daily metoprolol tartrate 50 mg tablet oral one and one half tablets oral (75 mg.) two times daily diltiazem 30 mg tablet oral 1 1 tablet oral four times daily Xarelto 20 mg tablet oral 1 1 tablet oral daily prednisone 5  mg tablet oral 1 1 tablet oral DAILY fluticasone 50 mcg/actuation nasal spray,suspension nasal 2 2 sprays, suspension nasal into both nostrils daily meloxicam 15 mg tablet oral 1 1 tablet oral daily hydrochlorothiazide 12.5 mg capsule oral 1 1 capsule oral daily Objective Kotch, [name] ([id number]) Constitutional Pulse regular. Respirations normal and unlabored. Afebrile. Vitals Time Taken: 8:07 AM, Height: 70 in, Source: Stated, Weight: 216.7 lbs, Source: Measured,  BMI: 31.1, Temperature: 97.8 F, Pulse: 81 bpm, Respiratory Rate: 18 breaths/min, Blood Pressure: 145/79 mmHg. Eyes Nonicteric. Reactive to light. Ears, Nose, Mouth, and Throat Lips, teeth, and gums WNL.[name] Moist mucosa without lesions. Neck supple and nontender. No palpable supraclavicular or cervical adenopathy. Normal sized without goiter. Respiratory WNL. No retractions.. Cardiovascular Pedal Pulses WNL. his left radial pulses well palpable but the ulna pulses not so strong.. No clubbing, cyanosis or edema. Chest Breasts symmetical and no nipple discharge.. Breast tissue WNL, no masses, lumps, or tenderness.. Gastrointestinal (GI) Abdomen without masses or tenderness.. No liver or spleen enlargement or tenderness.. Lymphatic No adneopathy. No adenopathy. No adenopathy. Musculoskeletal Adexa without tenderness or enlargement.. Digits and nails w/o clubbing, cyanosis, infection, petechiae, ischemia, or inflammatory conditions.[name] Psychiatric Judgement and insight Intact.. No evidence of depression, anxiety, or agitation.. General Notes: on the pulp of the left little finger medially he has a patch of dry gangrene which is demarcating well. There is no cellulitis or inflammation and no involvement of the nail bed. Integumentary (Hair, Skin) No suspicious lesions. No crepitus or fluctuance. No peri-wound warmth or erythema. No masses.. Wound #1 status is Open. Original cause of wound was Gradually Appeared. The wound is located on the Left,Distal Hand - 5th Digit. The wound measures 1cm length x 1cm width x 0.1cm depth; 0.785cm^2 area and 0.079cm^3 volume. The wound is limited to skin breakdown. There is a none present amount of Kuklinski, [name] ([id number]) drainage noted. The wound margin is flat and intact. There is no granulation within the wound bed. There is a large (67-100%) amount of necrotic tissue within the wound bed including Eschar. The periwound skin appearance  exhibited: Callus. The periwound skin appearance did not exhibit: Crepitus, Excoriation, Fluctuance, Friable, Induration, Localized Edema, Rash, Scarring, Dry/Scaly, Maceration, Moist, Atrophie Blanche, Cyanosis, Ecchymosis, Hemosiderin Staining, Mottled, Pallor, Rubor, Erythema. Periwound temperature was noted as Cool/Cold. The periwound has tenderness on palpation. Assessment Active Problems ICD-10 S61.207A - Unspecified open wound of left little finger without damage to nail, initial encounter S65.507A - Unspecified injury of blood vessel of left little finger, initial encounter I73.01 - Raynaud's syndrome with gangrene This 55 year old gentleman who has a significant cardiac history, has signs and symptoms of Raynaud syndrome and definitely has dry gangrene of the left little finger at the tip. I have recommended Betadine painting locally with a protective foam bandage. I have impressed upon him that he needs an arterial duplex study of the left upper extremity compared to the right and have referred him to Dr. Lahoma Crocker at the cardiology clinic where he is already a patient. I have personally spoken to Dr. Annia Belt, who will take on his care. The patient fully understands the treatment plan to be compliant and see me back next week for wound checkup Plan Wound Cleansing: Wound #1 Left,Distal Hand - 5th Digit: Clean wound with Normal Saline. Anesthetic: Wound #1 Left,Distal Hand - 5th Digit: Topical Lidocaine 4% cream applied to wound bed prior to debridement Primary Wound Dressing: Wound #1 Left,Distal Hand - 5th Digit: Other: - Betadine and  self adherent dressing Dressing Change Frequency: Wound #1 Left,Distal Hand - 5th Digit: Mierzejewski, [name] ([id number]) Change dressing every day. Follow-up Appointments: Wound #1 Left,Distal Hand - 5th Digit: Return Appointment in 1 week. Consults ordered were: Vascular - Left upper arterial duplex study This 55 year old  gentleman who has a significant cardiac history, has signs and symptoms of Raynaud syndrome and definitely has dry gangrene of the left little finger at the tip. I have recommended Betadine painting locally with a protective foam bandage. I have impressed upon him that he needs an arterial duplex study of the left upper extremity compared to the right and have referred him to Dr. Lahoma Crocker at the cardiology clinic where he is already a patient. I have personally spoken to Dr. Annia Belt, who will take on his care. The patient fully understands the treatment plan to be compliant and see me back next week for wound checkup Electronic Signature(s) Signed: 11/04/2015 9:50:39 AM By: Devin Fudge MD, FACS Entered By: [name] on 11/04/2015 09:50:39 Oregel, [name] ([id number]) -------------------------------------------------------------------------------- ROS/PFSH Details Patient Name: [name] Date of Service: 11/04/2015 8:00 AM Medical Record Number: [id number] Patient Account Number: [id number] Date of Birth/Sex: Oct 27, 1960 (55 y.o. Male) Treating RN: [name] Primary Care Physician: [name] Other Clinician: Referring Physician: Ree Griffin Treating Physician/Extender: [name] in Treatment: 0 Information Obtained From Patient Wound History Do you currently have one or more open woundso Yes How many open wounds do you currently haveo 1 Approximately how long have you had your woundso 3 months How have you been treating your wound(s) until nowo cream prescribed by derm Has your wound(s) ever healed and then re-openedo Yes Have you had any lab work done in the past montho No Have you tested positive for an antibiotic resistant organism (MRSA, No VRE)o Have you tested positive for osteomyelitis (bone infection)o No Have you had any tests for circulation on your legso No Respiratory Complaints and Symptoms: No Complaints or  Symptoms Complaints and Symptoms: Negative for: Chronic or frequent coughs; Shortness of Breath Medical History: Positive for: Chronic Obstructive Pulmonary Disease (COPD) Negative for: Aspiration; Asthma; Pneumothorax; Sleep Apnea; Tuberculosis Integumentary (Skin) Complaints and Symptoms: Positive for: Wounds; Bleeding or bruising tendency Constitutional Symptoms (General Health) Complaints and Symptoms: No Complaints or Symptoms Eyes Complaints and Symptoms: No Complaints or Symptoms Cicero, [name] ([id number]) Medical History: Negative for: Cataracts; Glaucoma; Optic Neuritis Ear/Nose/Mouth/Throat Complaints and Symptoms: No Complaints or Symptoms Medical History: Negative for: Chronic sinus problems/congestion; Middle ear problems Hematologic/Lymphatic Complaints and Symptoms: No Complaints or Symptoms Medical History: Negative for: Anemia; Hemophilia; Human Immunodeficiency Virus; Lymphedema; Sickle Cell Disease Cardiovascular Complaints and Symptoms: No Complaints or Symptoms Medical History: Positive for: Hypertension Negative for: Angina; Arrhythmia; Congestive Heart Failure; Coronary Artery Disease; Deep Vein Thrombosis; Hypotension; Myocardial Infarction; Peripheral Arterial Disease; Peripheral Venous Disease; Phlebitis; Vasculitis Gastrointestinal Complaints and Symptoms: No Complaints or Symptoms Medical History: Negative for: Cirrhosis ; Colitis; Crohnos; Hepatitis A; Hepatitis B; Hepatitis C Endocrine Complaints and Symptoms: No Complaints or Symptoms Medical History: Negative for: Type I Diabetes; Type II Diabetes Genitourinary Complaints and Symptoms: No Complaints or Symptoms Medical History: [name], [name] ([id number]) Negative for: End Stage Renal Disease Immunological Complaints and Symptoms: No Complaints or Symptoms Medical History: Positive for: Lupus Erythematosus - Skin lupus Negative for: Raynaudos;  Scleroderma Musculoskeletal Complaints and Symptoms: No Complaints or Symptoms Medical History: Positive for: Rheumatoid Arthritis Negative for: Gout; Osteoarthritis; Osteomyelitis Neurologic Complaints and Symptoms: No Complaints or Symptoms Medical  History: Negative for: Dementia; Neuropathy; Quadriplegia; Paraplegia; Seizure Disorder Oncologic Complaints and Symptoms: No Complaints or Symptoms Medical History: Negative for: Received Chemotherapy; Received Radiation Psychiatric Complaints and Symptoms: No Complaints or Symptoms Medical History: Negative for: Anorexia/bulimia; Confinement Anxiety Family and Social History Cancer: Yes - Mother; Diabetes: No; Heart Disease: No; Hypertension: Yes - Father, Mother; Kidney Disease: No; Lung Disease: No; Seizures: No; Stroke: No; Thyroid Problems: No; Never smoker - 18 plus years; Marital Status - Married; Alcohol Use: Rarely; Drug Use: No History; Caffeine Use: Rarely; Advanced Directives: No; Patient does not want information on Advanced Directives; Living Will: No; Medical Power of Attorney: No Physician Affirmation [name], [name] ([id number]) I have reviewed and agree with the above information. Electronic Signature(s) Signed: 11/04/2015 8:31:42 AM By: Devin Fudge MD, FACS Signed: 11/05/2015 4:33:28 PM By: Gretta Cool RN, BSN, Kim RN, BSN Entered By: [name] on 11/04/2015 08:31:41 Troop, [name] ([id number]) -------------------------------------------------------------------------------- [facility] Details Patient Name: [name] Date of Service: 11/04/2015 Medical Record Number: [id number] Patient Account Number: [id number] Date of Birth/Sex: [birth year] (55 y.o. Male) Treating RN: [name] Primary Care Physician: [name] Other Clinician: Referring Physician: Ree Griffin Treating Physician/Extender: [name] in Treatment: 0 Diagnosis Coding ICD-10 Codes Code  Description [phone] Unspecified open wound of left little finger without damage to nail, initial encounter S65.507A Unspecified injury of blood vessel of left little finger, initial encounter I73.01 Raynaud's syndrome with gangrene Facility Procedures CPT4 Code: [id number] Description: 99213 - WOUND CARE VISIT-LEV 3 EST PT Modifier: Quantity: 1 Physician Procedures CPT4: Description Modifier Quantity Code [id number] - WC PHYS LEVEL 4 - NEW PT 1 ICD-10 Description Diagnosis S61.207A Unspecified open wound of left little finger without damage to nail, initial encounter S65.507A Unspecified injury of blood vessel  of left little finger, initial encounter I73.01 Raynaud's syndrome with gangrene Electronic Signature(s) Signed: 11/04/2015 9:50:59 AM By: Devin Fudge MD, FACS Entered By: [name] on 11/04/2015 09:50:58

## 2015-11-04 NOTE — Telephone Encounter (Signed)
Per Dr. 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 Schedule him for left upper extremity arterial duplex and follow up with me ASAP.    Scheduled today, June 8, 2pm at [facility], short stay entrance. Sent to pre-cert. Scheduled f/u appt w/Dr. Fletcher Anon Monday, June 12, 9:15am Left message on machine for patient to contact the office.  Left message on wife's VM to contact the office.

## 2015-11-04 NOTE — Telephone Encounter (Signed)
S/w pt who is agreeable w/upper extremity arterial duplex today at [facility] and f/u June 12 w/Dr. Fletcher Anon. Reviewed time and location. Pt verbalized understanding with no further questions.

## 2015-11-06 NOTE — Progress Notes (Signed)
CALYX, BUTNER ([id number]) Visit Report for 11/04/2015 Abuse/Suicide Risk Screen Details Patient Name: [name] Date of Service: 11/04/2015 8:00 AM Medical Record Number: [id number] Patient Account Number: [id number] Date of Birth/Sex: Jun 03, 1960 (55 y.o. Male) Treating RN: Cornell Barman Primary Care Physician: Otilio Miu Other Clinician: Referring Physician: Ree Edman Treating Physician/Extender: Frann Rider in Treatment: 0 Abuse/Suicide Risk Screen Items Answer ABUSE/SUICIDE RISK SCREEN: Has anyone close to you tried to hurt or harm you recentlyo No Do you feel uncomfortable with anyone in your familyo No Has anyone forced you do things that you didnot want to doo No Do you have any thoughts of harming yourselfo No Patient displays signs or symptoms of abuse and/or neglect. No Electronic Signature(s) Signed: 11/05/2015 4:33:28 PM By: Gretta Cool, RN, BSN, Kim RN, BSN Entered By: Gretta Cool, RN, BSN, Kim on 11/04/2015 08:23:26 [place], [name] ([id number]) -------------------------------------------------------------------------------- Activities of Daily Living Details Patient Name: [name] Date of Service: 11/04/2015 8:00 AM Medical Record Number: [id number] Patient Account Number: [id number] Date of Birth/Sex: [birth year] (55 y.o. Male) Treating RN: Cornell Barman Primary Care Physician: Otilio Miu Other Clinician: Referring Physician: Ree Edman Treating Physician/Extender: Frann Rider in Treatment: 0 Activities of Daily Living Items Answer Activities of Daily Living (Please select one for each item) Drive Automobile Completely Able Take Medications Completely Able Use Telephone Completely H. Rivera Colon for Appearance Completely Able Use Toilet Completely Able Bath / Shower Completely Able Dress Self Completely Able Feed Self Completely Able Walk Completely Able Get In / Out Bed Completely Able Housework Completely  Able Prepare Meals Completely [place] for Self Completely Able Electronic Signature(s) Signed: 11/05/2015 4:33:28 PM By: Gretta Cool, RN, BSN, Kim RN, BSN Entered By: Gretta Cool, RN, BSN, Kim on 11/04/2015 08:23:34 Hairo, Dooly [name] ([id number]) -------------------------------------------------------------------------------- Education Assessment Details Patient Name: [name] Date of Service: 11/04/2015 8:00 AM Medical Record Number: [id number] Patient Account Number: [id number] Date of Birth/Sex: July 03, 1960 (55 y.o. Male) Treating RN: Cornell Barman Primary Care Physician: Otilio Miu Other Clinician: Referring Physician: Ree Edman Treating Physician/Extender: Frann Rider in Treatment: 0 Primary Learner Assessed: Patient Learning Preferences/Education Level/Primary Language Learning Preference: Explanation Highest Education Level: High School Preferred Language: English Cognitive Barrier Assessment/Beliefs Language Barrier: No Translator Needed: No Memory Deficit: No Emotional Barrier: No Cultural/Religious Beliefs Affecting Medical No Care: Physical Barrier Assessment Impaired Vision: Yes Glasses Impaired Hearing: No Knowledge/Comprehension Assessment Knowledge Level: High Comprehension Level: High Ability to understand written High instructions: Ability to understand verbal High instructions: Motivation Assessment Anxiety Level: Calm Cooperation: Cooperative Education Importance: Acknowledges Need Interest in Health Problems: Asks Questions Perception: Coherent Willingness to Engage in Self- High Management Activities: Readiness to Engage in Self- High Management Activities: Electronic Signature(s) Signed: 11/05/2015 4:33:28 PM By: Gretta Cool, RN, BSN, Kim RN, BSN [place], [place] ([id number]) Entered By: Gretta Cool, RN, BSN, Kim on 11/04/2015 08:24:02 NIXSON, BUTTA  ([id number]) -------------------------------------------------------------------------------- Fall Risk Assessment Details Patient Name: [name] Date of Service: 11/04/2015 8:00 AM Medical Record Number: [id number] Patient Account Number: [id number] Date of Birth/Sex: 06/17/1960 (55 y.o. Male) Treating RN: Cornell Barman Primary Care Physician: Otilio Miu Other Clinician: Referring Physician: Ree Edman Treating Physician/Extender: Frann Rider in Treatment: 0 Fall Risk Assessment Items Have you had 2 or more falls in the last 12 monthso 0 Yes Have you had any fall that resulted in injury in the last 12 monthso 0 Yes FALL RISK ASSESSMENT: History of falling - immediate or within 3 months 0 No Secondary  diagnosis 0 No Ambulatory aid None/bed rest/wheelchair/nurse 0 Yes Crutches/cane/walker 0 No Furniture 0 No IV Access/Saline Lock 0 No Gait/Training Normal/bed rest/immobile 0 Yes Weak 0 No Impaired 0 No Mental Status Oriented to own ability 0 Yes Electronic Signature(s) Signed: 11/05/2015 4:33:28 PM By: Gretta Cool, RN, BSN, Kim RN, BSN Entered By: Gretta Cool, RN, BSN, Kim on 11/04/2015 08:24:14 [place], [name] ([id number]) -------------------------------------------------------------------------------- Foot Assessment Details Patient Name: [name] Date of Service: 11/04/2015 8:00 AM Medical Record Number: [id number] Patient Account Number: [id number] Date of Birth/Sex: 09-04-1960 (55 y.o. Male) Treating RN: Cornell Barman Primary Care Physician: Otilio Miu Other Clinician: Referring Physician: Ree Edman Treating Physician/Extender: Frann Rider in Treatment: 0 Foot Assessment Items Site Locations + = Sensation present, - = Sensation absent, C = Callus, U = Ulcer R = Redness, W = Warmth, M = Maceration, PU = Pre-ulcerative lesion F = Fissure, S = Swelling, D = Dryness Assessment Right: Left: Other Deformity: No No Prior Foot  Ulcer: No No Prior Amputation: No No Charcot Joint: No No Ambulatory Status: Gait: Electronic Signature(s) Signed: 11/05/2015 4:33:28 PM By: Gretta Cool, RN, BSN, Kim RN, BSN Entered By: Gretta Cool, RN, BSN, Kim on 11/04/2015 08:24:38 [place], [name] ([id number]) -------------------------------------------------------------------------------- Nutrition Risk Assessment Details Patient Name: [name] Date of Service: 11/04/2015 8:00 AM Medical Record Number: [id number] Patient Account Number: [id number] Date of Birth/Sex: 04-Oct-1960 (55 y.o. Male) Treating RN: Cornell Barman Primary Care Physician: Otilio Miu Other Clinician: Referring Physician: Ree Edman Treating Physician/Extender: Frann Rider in Treatment: 0 Height (in): 70 Weight (lbs): 216.7 Body Mass Index (BMI): 31.1 Nutrition Risk Assessment Items NUTRITION RISK SCREEN: I have an illness or condition that made me change the kind and/or 0 No amount of food I eat I eat fewer than two meals per day 0 No I eat few fruits and vegetables, or milk products 0 No I have three or more drinks of beer, liquor or wine almost every day 0 No I have tooth or mouth problems that make it hard for me to eat 0 No I don't always have enough money to buy the food I need 0 No I eat alone most of the time 0 No I take three or more different prescribed or over-the-counter drugs a 0 No day Without wanting to, I have lost or gained 10 pounds in the last six 0 No months I am not always physically able to shop, cook and/or feed myself 0 No Nutrition Protocols Good Risk Protocol 0 No interventions needed Moderate Risk Protocol Electronic Signature(s) Signed: 11/05/2015 4:33:28 PM By: Gretta Cool, RN, BSN, Kim RN, BSN Entered By: Gretta Cool, RN, BSN, Kim on 11/04/2015 [id number]

## 2015-11-06 NOTE — Progress Notes (Signed)
PATIENCE, BENION ([id number]) Visit Report for 11/04/2015 Allergy List Details Patient Name: [name], [name] Date of Service: 11/04/2015 8:00 AM Medical Record Number: [id number] Patient Account Number: [id number] Date of Birth/Sex: 06-19-1960 (55 y.o. Male) Treating RN: Cornell Barman Primary Care Physician: Otilio Miu Other Clinician: Referring Physician: Ree Edman Treating Physician/Extender: Frann Rider in Treatment: 0 Allergies Active Allergies No Known Drug Allergies Allergy Notes Electronic Signature(s) Signed: 11/05/2015 4:33:28 PM By: Devin Cool, RN, BSN, Kim RN, BSN Entered By: Devin Cool, RN, BSN, Kim on 11/04/2015 08:14:59 [name] ([id number]) -------------------------------------------------------------------------------- Arrival Information Details Patient Name: [name] Date of Service: 11/04/2015 8:00 AM Medical Record Number: [id number] Patient Account Number: [id number] Date of Birth/Sex: 11/13/1960 (55 y.o. Male) Treating RN: Cornell Barman Primary Care Physician: Otilio Miu Other Clinician: Referring Physician: Ree Edman Treating Physician/Extender: Frann Rider in Treatment: 0 Visit Information Patient Arrived: Ambulatory Arrival Time: 08:06 Accompanied By: self Transfer Assistance: None Patient Identification Verified: Yes Secondary Verification Process Yes Completed: Patient Has Alerts: Yes Patient Alerts: Patient on Blood Thinner Xarelto Electronic Signature(s) Signed: 11/05/2015 4:33:28 PM By: Devin Cool, RN, BSN, Kim RN, BSN Entered By: Devin Cool, RN, BSN, Kim on 11/04/2015 08:07:00 [name] ([id number]) -------------------------------------------------------------------------------- Clinic Level of Care Assessment Details Patient Name: [name] Date of Service: 11/04/2015 8:00 AM Medical Record Number: [id number] Patient Account Number: [id number] Date of Birth/Sex: 26-Apr-1961 (55 y.o.  Male) Treating RN: Cornell Barman Primary Care Physician: Otilio Miu Other Clinician: Referring Physician: Ree Edman Treating Physician/Extender: Frann Rider in Treatment: 0 Clinic Level of Care Assessment Items TOOL 2 Quantity Score []  - Use when only an EandM is performed on the INITIAL visit 0 ASSESSMENTS - Nursing Assessment / Reassessment X - General Physical Exam (combine w/ comprehensive assessment (listed just 1 20 below) when performed on new pt. evals) []  - Comprehensive Assessment (HX, ROS, Risk Assessments, Wounds Hx, etc.) 0 ASSESSMENTS - Wound and Skin Assessment / Reassessment X - Simple Wound Assessment / Reassessment - one wound 1 5 []  - Complex Wound Assessment / Reassessment - multiple wounds 0 []  - Dermatologic / Skin Assessment (not related to wound area) 0 ASSESSMENTS - Ostomy and/or Continence Assessment and Care []  - Incontinence Assessment and Management 0 []  - Ostomy Care Assessment and Management (repouching, etc.) 0 PROCESS - Coordination of Care X - Simple Patient / Family Education for ongoing care 1 15 []  - Complex (extensive) Patient / Family Education for ongoing care 0 X - Staff obtains Programmer, systems, Records, Test Results / Process Orders 1 10 []  - Staff telephones HHA, Nursing Homes / Clarify orders / etc 0 []  - Routine Transfer to another Facility (non-emergent condition) 0 []  - Routine Hospital Admission (non-emergent condition) 0 X - New Admissions / Biomedical engineer / Ordering NPWT, Apligraf, etc. 1 15 []  - Emergency Hospital Admission (emergent condition) 0 X - Simple Discharge Coordination 1 10 [name], [name] ([id number]) []  - Complex (extensive) Discharge Coordination 0 PROCESS - Special Needs []  - Pediatric / Minor Patient Management 0 []  - Isolation Patient Management 0 []  - Hearing / Language / Visual special needs 0 []  - Assessment of Community assistance (transportation, D/C planning, etc.) 0 []  -  Additional assistance / Altered mentation 0 []  - Support Surface(s) Assessment (bed, cushion, seat, etc.) 0 INTERVENTIONS - Wound Cleansing / Measurement X - Wound Imaging (photographs - any number of wounds) 1 5 []  - Wound Tracing (instead of photographs) 0 X - Simple Wound Measurement - one wound  1 5 []  - Complex Wound Measurement - multiple wounds 0 X - Simple Wound Cleansing - one wound 1 5 []  - Complex Wound Cleansing - multiple wounds 0 INTERVENTIONS - Wound Dressings X - Small Wound Dressing one or multiple wounds 1 10 []  - Medium Wound Dressing one or multiple wounds 0 []  - Large Wound Dressing one or multiple wounds 0 []  - Application of Medications - injection 0 INTERVENTIONS - Miscellaneous []  - External ear exam 0 []  - Specimen Collection (cultures, biopsies, blood, body fluids, etc.) 0 []  - Specimen(s) / Culture(s) sent or taken to Lab for analysis 0 []  - Patient Transfer (multiple staff / Harrel Lemon Lift / Similar devices) 0 []  - Simple Staple / Suture removal (25 or less) 0 []  - Complex Staple / Suture removal (26 or more) 0 [name], [name] ([id number]) []  - Hypo / Hyperglycemic Management (close monitor of Blood Glucose) 0 []  - Ankle / Brachial Index (ABI) - do not check if billed separately 0 Has the patient been seen at the hospital within the last three years: Yes Total Score: 100 Level Of Care: New/Established - Level 3 Electronic Signature(s) Signed: 11/05/2015 4:33:28 PM By: Devin Cool, RN, BSN, Kim RN, BSN Entered By: Devin Cool, RN, BSN, Kim on 11/04/2015 08:49:35 [name] ([id number]) -------------------------------------------------------------------------------- Encounter Discharge Information Details Patient Name: [name] Date of Service: 11/04/2015 8:00 AM Medical Record Number: [id number] Patient Account Number: [id number] Date of Birth/Sex: 1961/04/16 (55 y.o. Male) Treating RN: Cornell Barman Primary Care Physician: Otilio Miu Other  Clinician: Referring Physician: Ree Edman Treating Physician/Extender: Frann Rider in Treatment: 0 Encounter Discharge Information Items Discharge Pain Level: 0 Discharge Condition: Stable Ambulatory Status: Ambulatory Discharge Destination: Home Transportation: Private Auto Accompanied By: self Schedule Follow-up Appointment: Yes Medication Reconciliation completed and provided to Patient/Care Yes Perina Salvaggio: Provided on Clinical Summary of Care: 11/04/2015 Form Type Recipient Paper Patient [place] Signature(s) Signed: 11/04/2015 8:56:45 AM By: Ruthine Dose Entered By: Ruthine Dose on 11/04/2015 08:56:45 Smarr, [name] ([id number]) -------------------------------------------------------------------------------- Multi Wound Chart Details Patient Name: [name] Date of Service: 11/04/2015 8:00 AM Medical Record Number: [id number] Patient Account Number: [id number] Date of Birth/Sex: 05/15/1961 (55 y.o. Male) Treating RN: Cornell Barman Primary Care Physician: Otilio Miu Other Clinician: Referring Physician: Ree Edman Treating Physician/Extender: Frann Rider in Treatment: 0 Vital Signs Height(in): 70 Pulse(bpm): 81 Weight(lbs): 216.7 Blood Pressure 145/79 (mmHg): Body Mass Index(BMI): 31 Temperature(F): 97.8 Respiratory Rate 18 (breaths/min): Photos: [N/A:N/A] Wound Location: Left Hand - 5th Digit - N/A N/A Distal Wounding Event: Gradually Appeared N/A N/A Primary Etiology: To be determined N/A N/A Date Acquired: 07/28/2015 N/A N/A Weeks of Treatment: 0 N/A N/A Wound Status: Open N/A N/A Measurements L x W x D 1x1x0.1 N/A N/A (cm) Area (cm) : 0.785 N/A N/A Volume (cm) : 0.079 N/A N/A % Reduction in Area: 0.00% N/A N/A % Reduction in Volume: 0.00% N/A N/A Classification: Unclassifiable N/A N/A Exudate Amount: None Present N/A N/A Wound Margin: Flat and Intact N/A N/A Granulation Amount: None Present  (0%) N/A N/A Necrotic Amount: Large (67-100%) N/A N/A Necrotic Tissue: Eschar N/A N/A Exposed Structures: Fascia: No N/A N/A Fat: No Tendon: No Muscle: No Pavlich, [name] ([id number]) Joint: No Bone: No Limited to Skin Breakdown Epithelialization: None N/A N/A Periwound Skin Texture: Callus: Yes N/A N/A Edema: No Excoriation: No Induration: No Crepitus: No Fluctuance: No Friable: No Rash: No Scarring: No Periwound Skin Maceration: No N/A N/A Moisture: Moist: No Dry/Scaly: No Periwound Skin  Color: Atrophie Blanche: No N/A N/A Cyanosis: No Ecchymosis: No Erythema: No Hemosiderin Staining: No Mottled: No Pallor: No Rubor: No Temperature: Griffin/Cold N/A N/A Tenderness on Yes N/A N/A Palpation: Wound Preparation: Ulcer Cleansing: N/A N/A Rinsed/Irrigated with Saline Topical Anesthetic Applied: Other: lidocaine 4% Treatment Notes Electronic Signature(s) Signed: 11/05/2015 4:33:28 PM By: Devin Cool, RN, BSN, Kim RN, BSN Entered By: Devin Cool, RN, BSN, Kim on 11/04/2015 [id number] [name] ([id number]) -------------------------------------------------------------------------------- Multi-Disciplinary Care Plan Details Patient Name: [name] Date of Service: 11/04/2015 8:00 AM Medical Record Number: [id number] Patient Account Number: [id number] Date of Birth/Sex: 1960-12-07 (55 y.o. Male) Treating RN: Cornell Barman Primary Care Physician: Otilio Miu Other Clinician: Referring Physician: Ree Edman Treating Physician/Extender: Frann Rider in Treatment: 0 Active Inactive Abuse / Safety / Falls / Self Care Management Nursing Diagnoses: Potential for falls Goals: Patient will remain injury free Date Initiated: 11/04/2015 Goal Status: Active Interventions: Assess fall risk on admission and as needed Notes: Necrotic Tissue Nursing Diagnoses: Impaired tissue integrity related to necrotic/devitalized  tissue Goals: Necrotic/devitalized tissue will be minimized in the wound bed Date Initiated: 11/04/2015 Goal Status: Active Interventions: Assess patient pain level pre-, during and post procedure and prior to discharge Treatment Activities: Apply topical anesthetic as ordered : 11/04/2015 Notes: Orientation to the Wound Care Program Nursing Diagnoses: Knowledge deficit related to the wound healing center program Mcclatchy, [name] ([id number]) Goals: Patient/caregiver will verbalize understanding of the [facility] Date Initiated: 11/04/2015 Goal Status: Active Interventions: Provide education on orientation to the wound center Notes: Wound/Skin Impairment Nursing Diagnoses: Impaired tissue integrity Goals: Ulcer/skin breakdown will heal within 14 weeks Date Initiated: 11/04/2015 Goal Status: Active Interventions: Assess ulceration(s) every visit Notes: Electronic Signature(s) Signed: 11/05/2015 4:33:28 PM By: Devin Cool, RN, BSN, Kim RN, BSN Entered By: Devin Cool, RN, BSN, Kim on 11/04/2015 08:25:48 Clardy, [name] ([id number]) -------------------------------------------------------------------------------- Pain Assessment Details Patient Name: [name] Date of Service: 11/04/2015 8:00 AM Medical Record Number: [id number] Patient Account Number: [id number] Date of Birth/Sex: Feb 12, 1961 (55 y.o. Male) Treating RN: Cornell Barman Primary Care Physician: Otilio Miu Other Clinician: Referring Physician: Ree Edman Treating Physician/Extender: Frann Rider in Treatment: 0 Active Problems Location of Pain Severity and Description of Pain Patient Has Paino No Site Locations With Dressing Change: No Pain Management and Medication Current Pain Management: Electronic Signature(s) Signed: 11/05/2015 4:33:28 PM By: Devin Cool, RN, BSN, Kim RN, BSN Entered By: Devin Cool, RN, BSN, Kim on 11/04/2015 08:07:29 Sobocinski, [name]  ([id number]) -------------------------------------------------------------------------------- Patient/Caregiver Education Details Patient Name: [name] Date of Service: 11/04/2015 8:00 AM Medical Record Number: [id number] Patient Account Number: [id number] Date of Birth/Gender: [birth year] (55 y.o. Male) Treating RN: Cornell Barman Primary Care Physician: Otilio Miu Other Clinician: Referring Physician: Ree Edman Treating Physician/Extender: Frann Rider in Treatment: 0 Education Assessment Education Provided To: Patient Education Topics Provided Wound/Skin Impairment: Handouts: Caring for Your Ulcer, Other: wound care as prescribed Methods: Demonstration Responses: State content correctly Electronic Signature(s) Signed: 11/05/2015 4:33:28 PM By: Devin Cool, RN, BSN, Kim RN, BSN Entered By: Devin Cool, RN, BSN, Kim on 11/04/2015 08:55:35 Porta, [name] ([id number]) -------------------------------------------------------------------------------- Wound Assessment Details Patient Name: [name] Date of Service: 11/04/2015 8:00 AM Medical Record Number: [id number] Patient Account Number: [id number] Date of Birth/Sex: Nov 23, 1960 (55 y.o. Male) Treating RN: Cornell Barman Primary Care Physician: Otilio Miu Other Clinician: Referring Physician: Ree Edman Treating Physician/Extender: Frann Rider in Treatment: 0 Wound Status Wound Number: 1 Primary Etiology: To be determined Wound Location: Left Hand -  5th Digit - Distal Wound Status: Open Wounding Event: Gradually Appeared Date Acquired: 07/28/2015 Weeks Of Treatment: 0 Clustered Wound: No Photos Wound Measurements Length: (cm) 1 Width: (cm) 1 Depth: (cm) 0.1 Area: (cm) 0.785 Volume: (cm) 0.079 % Reduction in Area: 0% % Reduction in Volume: 0% Epithelialization: None Wound Description Classification: Unclassifiable Wound Margin: Flat and Intact Exudate Amount: None  Present Wound Bed Granulation Amount: None Present (0%) Exposed Structure Necrotic Amount: Large (67-100%) Fascia Exposed: No Necrotic Quality: Eschar Fat Layer Exposed: No Tendon Exposed: No Muscle Exposed: No Joint Exposed: No Bone Exposed: No Pritchard, [name] ([id number]) Limited to Skin Breakdown Periwound Skin Texture Texture Color No Abnormalities Noted: No No Abnormalities Noted: No Callus: Yes Atrophie Blanche: No Crepitus: No Cyanosis: No Excoriation: No Ecchymosis: No Fluctuance: No Erythema: No Friable: No Hemosiderin Staining: No Induration: No Mottled: No Localized Edema: No Pallor: No Rash: No Rubor: No Scarring: No Temperature / Pain Moisture Temperature: Griffin/Cold No Abnormalities Noted: No Tenderness on Palpation: Yes Dry / Scaly: No Maceration: No Moist: No Wound Preparation Ulcer Cleansing: Rinsed/Irrigated with Saline Topical Anesthetic Applied: Other: lidocaine 4%, Treatment Notes Wound #1 (Left, Distal Hand - 5th Digit) 1. Cleansed with: Clean wound with Normal Saline 2. Anesthetic Topical Lidocaine 4% cream to wound bed prior to debridement 4. Dressing Applied: Other dressing (specify in notes) 7. Secured with Self adhesive bandage Notes Paint wound with betadine daily and cover with Band-Aid. Electronic Signature(s) Signed: 11/05/2015 4:33:28 PM By: Devin Cool, RN, BSN, Kim RN, BSN Entered By: Devin Cool, RN, BSN, Kim on 11/04/2015 08:13:17 Carver, [name] ([id number]) -------------------------------------------------------------------------------- [place] Details Patient Name: [name] Date of Service: 11/04/2015 8:00 AM Medical Record Number: [id number] Patient Account Number: [id number] Date of Birth/Sex: 10-Sep-1960 (55 y.o. Male) Treating RN: Cornell Barman Primary Care Physician: Otilio Miu Other Clinician: Referring Physician: Ree Edman Treating Physician/Extender: Frann Rider in Treatment:  0 Vital Signs Time Taken: 08:07 Temperature (F): 97.8 Height (in): 70 Pulse (bpm): 81 Source: Stated Respiratory Rate (breaths/min): 18 Weight (lbs): 216.7 Blood Pressure (mmHg): 145/79 Source: Measured Reference Range: 80 - 120 mg / dl Body Mass Index (BMI): 31.1 Electronic Signature(s) Signed: 11/05/2015 4:33:28 PM By: Devin Cool, RN, BSN, Kim RN, BSN Entered By: Devin Cool, RN, BSN, Kim on 11/04/54 [id number]

## 2015-11-08 ENCOUNTER — Other Ambulatory Visit
Admission: RE | Admit: 2015-11-08 | Discharge: 2015-11-08 | Disposition: A | Discharge status: Home / Self Care | Payer: 59 | Source: Ambulatory Visit | Attending: Cardiovascular Disease | Admitting: Cardiovascular Disease

## 2015-11-08 ENCOUNTER — Encounter: Payer: Self-pay | Admitting: Cardiovascular Disease

## 2015-11-08 ENCOUNTER — Ambulatory Visit (INDEPENDENT_AMBULATORY_CARE_PROVIDER_SITE_OTHER): Payer: 59 | Admitting: Cardiovascular Disease

## 2015-11-08 VITALS — BP 158/90 | HR 66 | Ht 70.0 in | Wt 217.5 lb

## 2015-11-08 DIAGNOSIS — J8410 Pulmonary fibrosis, unspecified: Secondary | ICD-10-CM | POA: Diagnosis not present

## 2015-11-08 DIAGNOSIS — I4891 Unspecified atrial fibrillation: Secondary | ICD-10-CM | POA: Insufficient documentation

## 2015-11-08 DIAGNOSIS — I7389 Other specified peripheral vascular diseases: Secondary | ICD-10-CM | POA: Diagnosis present

## 2015-11-08 DIAGNOSIS — Z01812 Encounter for preprocedural laboratory examination: Secondary | ICD-10-CM

## 2015-11-08 DIAGNOSIS — I10 Essential (primary) hypertension: Secondary | ICD-10-CM | POA: Diagnosis not present

## 2015-11-08 DIAGNOSIS — M069 Rheumatoid arthritis, unspecified: Secondary | ICD-10-CM | POA: Diagnosis not present

## 2015-11-08 DIAGNOSIS — R0602 Shortness of breath: Secondary | ICD-10-CM | POA: Diagnosis not present

## 2015-11-08 LAB — BASIC METABOLIC PANEL
Anion gap: 7 (ref 5–15)
BUN: 19 mg/dL (ref 6–20)
CHLORIDE: 103 mmol/L (ref 101–111)
CO2: 29 mmol/L (ref 22–32)
Calcium: 9.4 mg/dL (ref 8.9–10.3)
Creatinine, Ser: 0.94 mg/dL (ref 0.61–1.24)
GFR calc Af Amer: >60 mL/min (ref >60)
GFR calc non Af Amer: >60 mL/min (ref >60)
GLUCOSE: 84 mg/dL (ref 65–99)
POTASSIUM: 3.5 mmol/L (ref 3.5–5.1)
Sodium: 139 mmol/L (ref 135–145)

## 2015-11-08 LAB — CBC
HEMATOCRIT: 44.6 % (ref 40.0–52.0)
Hemoglobin: 14.8 g/dL (ref 13.0–18.0)
MCH: 27.5 pg (ref 26.0–34.0)
MCHC: 33.2 g/dL (ref 32.0–36.0)
MCV: 83 fL (ref 80.0–100.0)
Platelets: 205 10*3/uL (ref 150–440)
RBC: 5.37 MIL/uL (ref 4.40–5.90)
RDW: 13.9 % (ref 11.5–14.5)
WBC: 8.5 10*3/uL (ref 3.8–10.6)

## 2015-11-08 LAB — PROTIME-INR
INR: 2.06
Prothrombin Time: 23.1 seconds — ABNORMAL HIGH (ref 11.4–15.0)

## 2015-11-08 NOTE — Patient Instructions (Addendum)
Medication Instructions:  Your physician recommends that you continue on your current medications as directed. Please refer to the Current Medication list given to you today.   Labwork: BMET, CBC, PT/INR  Testing/Procedures: Your physician has requested that you have an echocardiogram. Echocardiography is a painless test that uses sound waves to create images of your heart. It provides your doctor with information about the size and shape of your heart and how well your heart's [place] and valves are working. This procedure takes approximately one hour. There are no restrictions for this procedure.   Left upper extremity angiograph Wednesday, June 21 Arrival time: 8:00am Nothing to eat or drink after midnight the evening before your procedure. STOP taking Xarelto two days before your procedure.  [facility] [place] [phone]    Follow-Up: Your physician recommends that you schedule a follow-up appointment in: one month with Dr. Fletcher Anon.    Any Other Special Instructions Will Be Listed Below (If Applicable).     If you need a refill on your cardiac medications before your next appointment, please call your pharmacy.  Echocardiogram An echocardiogram, or echocardiography, uses sound waves (ultrasound) to produce an image of your heart. The echocardiogram is simple, painless, obtained within a short period of time, and offers valuable information to your health care provider. The images from an echocardiogram can provide information such as:  Evidence of coronary artery disease (CAD).  Heart size.  Heart muscle function.  Heart valve function.  Aneurysm detection.  Evidence of a past heart attack.  Fluid buildup around the heart.  Heart muscle thickening.  Assess heart valve function. LET [facility] CARE PROVIDER KNOW ABOUT:  Any allergies you have.  All medicines you are taking, including vitamins, herbs, eye drops,  creams, and over-the-counter medicines.  Previous problems you or members of your family have had with the use of anesthetics.  Any blood disorders you have.  Previous surgeries you have had.  Medical conditions you have.  Possibility of pregnancy, if this applies. BEFORE THE PROCEDURE  No special preparation is needed. Eat and drink normally.  PROCEDURE   In order to produce an image of your heart, gel will be applied to your chest and a wand-like tool (transducer) will be moved over your chest. The gel will help transmit the sound waves from the transducer. The sound waves will harmlessly bounce off your heart to allow the heart images to be captured in real-time motion. These images will then be recorded.  You may need an IV to receive a medicine that improves the quality of the pictures. AFTER THE PROCEDURE You may return to your normal schedule including diet, activities, and medicines, unless your health care provider tells you otherwise.   This information is not intended to replace advice given to you by your health care provider. Make sure you discuss any questions you have with your health care provider.   Document Released: 05/12/2000 Document Revised: 06/05/2014 Document Reviewed: 01/20/2013 Elsevier Interactive Patient Education [employer].

## 2015-11-08 NOTE — Progress Notes (Signed)
Cardiology Office Note   Date:  11/08/2015   ID:  [name], DOB 1960/07/24, MRN [id number]  PCP:  Otilio Miu, MD  Cardiologist:  Dr. Rockey Situ  Chief Complaint  Patient presents with  . other    Follow up from gangrene on finger and discuss the Upper Extremity Arterial Duplex Study. Meds reviewed by the patient verbally. "doing well."       History of Present Illness: [name] is a 55 y.o. male who was referred by Dr. Con Memos from the wound center for evaluation of nonhealing wound at the tip of the left little finger. The patient is followed by Dr.Gollan  for his chronic cardiac medical conditions. 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   the patient reports hand sensitivity to cold which started about 2 years ago and has been worsening. His fingers turn blue and her with significant pain when he is exposed to cold weather. He usually does better in the summertime. He developed an ulceration on the tip of the left small finger about 3 months ago which has gradually worsened with early gangrenous changes. No trauma.   he underwent upper extremity duplex which showed atypical flow in the ulnar artery and normal flow through the radial artery. The patient reports symptoms of substernal burning sensation over the last 1-2 years as well. He is known to have rheumatoid arthritis followed by Dr. Jefm Bryant. He is currently on prednisone.   Past Medical History  Diagnosis Date  . Atrial flutter ([place])     a. on Xarelto; b. s/p successful TEE/DCCV on 08/28/2014  . Pulmonary fibrosis ([place])     a. not on home oxygen  . RA (rheumatoid arthritis) ([place])   . HTN (hypertension)   .  Tachycardia-induced cardiomyopathy ([place])     a. echo 07/2014: EF 20-25%, cannot exclude atrial flutter,  global HK, possible bicuspid Ao valve, mod reduced RV sys fxn, mild-mod aortic valve sclerosis/calcification, mod TR, mildly elevated RVSP; b. TEE 08/28/2014: EF 30-35%, no intracardic thrombus, mildly dilated LA/RA, mild TR, mild-mod aortic sclerosis w/o stenosis; c. echo 09/2014: EF 55-60%, select images w/ inf HK  . History of nuclear stress test     a. 09/02/2014: no sig ischemia, GI uptake noted, no sig WMA, EF 48%, no EKG chanes concerning for ischemia, low risk scan    . Normal coronary arteries     a. cardiac cath 12/03/2014: no sigificant CAD, right dominant system, LVEF >55%, no MR or AS  . Gangrene of finger [facility])     Past Surgical History  Procedure Laterality Date  . Cardiac catheterization N/A 12/03/2014    Procedure: Left Heart Cath and Coronary Angiography;  Surgeon: Minna Merritts, MD;  Location: [facility];  Service: Cardiovascular;  Laterality: N/A;     Current Outpatient Prescriptions  Medication Sig Dispense Refill  . diltiazem (CARDIZEM) 30 MG tablet Take 1 tablet (30 mg total) by mouth 4 (four) times daily. Every 6 hours as needed for heart rate greater than 90 bpm 30 tablet 0  . enalapril (VASOTEC) 20 MG tablet TAKE 1 TABLET BY MOUTH EVERY DAY 30 tablet 6  . fluticasone (FLONASE) 50 MCG/ACT nasal spray Place 2 sprays into both nostrils daily. 16 g 0  . hydrochlorothiazide (MICROZIDE)  12.5 MG capsule Take 1 capsule (12.5 mg total) by mouth daily. 90 capsule 1  . meloxicam (MOBIC) 15 MG tablet Take 1 tablet (15 mg total) by mouth daily. 30 tablet 0  . metoprolol (LOPRESSOR) 50 MG tablet TAKE 1 AND 1/2 TABLETS BY MOUTH TWICE DAILY 90 tablet 6  . predniSONE (DELTASONE) 5 MG tablet Take 5 mg by mouth daily.    . VENTOLIN HFA 108 (90 Base) MCG/ACT inhaler INHALE 2 PUFFS BY MOUTH 4 TIMES DAILY 18 Inhaler 0  . XARELTO 20 MG TABS tablet TAKE 1 TABLET (20 MG TOTAL) BY  MOUTH EVERY MORNING. 30 tablet 3   Current Facility-Administered Medications  Medication Dose Route Frequency Provider Last Rate Last Dose  . triamcinolone acetonide (KENALOG) 10 MG/ML injection 10 mg  10 mg Other Once [employer], DPM      . triamcinolone acetonide (KENALOG) 10 MG/ML injection 10 mg  10 mg Other Once [employer], DPM        Allergies:   Review of patient's allergies indicates no known allergies.    Social History:  The patient  reports that he quit smoking about 11 years ago. His smoking use included Cigarettes. He has a 20 pack-year smoking history. He has never used smokeless tobacco. He reports that he does not drink alcohol or use illicit drugs.   Family History:  The patient's family history includes Leukemia in his mother.    ROS:  Please see the history of present illness.   Otherwise, review of systems are positive for none.   All other systems are reviewed and negative.    PHYSICAL EXAM: VS:  BP 158/90 mmHg  Pulse 66  Ht 5\' 10"  (1.778 m)  Wt 217 lb 8 oz (98.657 kg)  BMI 31.21 kg/m2 , BMI Body mass index is 31.21 kg/(m^2). GEN: Well nourished, well developed, in no acute distress HEENT: normal Neck: no JVD, carotid bruits, or masses Cardiac: RRR; no murmurs, rubs, or gallops,no edema  Respiratory:  clear to auscultation bilaterally, normal work of breathing GI: soft, nontender, nondistended, + BS MS: no deformity or atrophy Skin: warm and dry, no rash Neuro:  Strength and sensation are intact Psych: euthymic mood, full affect  vascular: Radial pulses normal bilaterally. Ulnar pulse is +1 on the right and absent on the left side. Lower extremity pulses are normal.  EKG:  EKG is not ordered today.    Recent Labs: 12/01/2014: Magnesium 1.7 11/08/2015: BUN 19; Creatinine, Ser 0.94; Hemoglobin 14.8; Platelets 205; Potassium 3.5; Sodium 139    Lipid Panel    Component Value Date/Time   CHOL 88 08/25/2014 0110   TRIG 124 08/25/2014 0110   HDL  24* 08/25/2014 0110   VLDL 25 08/25/2014 0110   LDLCALC 39 08/25/2014 0110      Wt Readings from Last 3 Encounters:  11/08/15 217 lb 8 oz (98.657 kg)  10/01/15 217 lb 4 oz (98.544 kg)  06/10/15 224 lb (101.606 kg)        ASSESSMENT AND PLAN:  1.  Nonhealing wound on the left small finger: Concerning for a vascular etiology. Less likely to be embolic from atrial flutter given that he has been maintaining in sinus rhythm and has been taking anticoagulation on a consistent basis. Duplex showed abnormal flow in the ulnar artery and I cannot palpate his ulnar pulse.   due to this, I recommended proceeding with left upper extremity angiography and possible endovascular intervention. I discussed risks and benefits with him.  2. Raynaud's syndrome with gangrene: This could be the reason for his nonhealing wound. The patient has symptoms of GERD and does have known history of rheumatoid arthritis. This raises the possibility of CREST syndrome. I advised him to schedule a follow-up with Dr. Jefm Bryant to discuss this.   3. Exertional dyspnea: Cardiac cath in July 2016 showed no significant coronary artery disease with normal ejection fraction. I requested an echocardiogram to make sure he does not have pulmonary hypertension.  4. Atrial flutter: No evidence of recurrent arrhythmia. He is tolerating anticoagulation.    Disposition:   FU with me in 1 month  Signed,  Kathlyn Sacramento, MD  11/08/2015 4:12 PM    [facility]

## 2015-11-11 ENCOUNTER — Encounter: Payer: 59 | Admitting: Surgery

## 2015-11-11 DIAGNOSIS — S61207A Unspecified open wound of left little finger without damage to nail, initial encounter: Secondary | ICD-10-CM | POA: Diagnosis not present

## 2015-11-11 NOTE — Progress Notes (Addendum)
PRANIT, SOKOL ([id number]) Visit Report for 11/11/2015 Arrival Information Details Patient Name: [name] Date of Service: 11/11/2015 10:00 AM Medical Record Number: [id number] Patient Account Number: [id number] Date of Birth/Sex: Jul 06, 1960 (55 y.o. Male) Treating RN: Cornell Barman Primary Care Physician: Otilio Miu Other Clinician: Referring Physician: Otilio Miu Treating Physician/Extender: Frann Rider in Treatment: 1 Visit Information History Since Last Visit Added or deleted any medications: No Patient Arrived: Ambulatory Any new allergies or adverse reactions: No Arrival Time: 10:02 Had a fall or experienced change in No Accompanied By: self activities of daily living that may affect Transfer Assistance: None risk of falls: Patient Identification Verified: Yes Hospitalized since last visit: No Secondary Verification Process Yes Has Dressing in Place as Prescribed: No Completed: Pain Present Now: No Patient Has Alerts: Yes Patient Alerts: Patient on Blood Thinner Xarelto Electronic Signature(s) Signed: 11/11/2015 11:40:57 AM By: Gretta Cool, RN, BSN, Kim RN, BSN Entered By: Gretta Cool, RN, BSN, Kim on 11/11/2015 10:03:17 [name] ([id number]) -------------------------------------------------------------------------------- Clinic Level of Care Assessment Details Patient Name: [name] Date of Service: 11/11/2015 10:00 AM Medical Record Number: [id number] Patient Account Number: [id number] Date of Birth/Sex: [birth year] (55 y.o. Male) Treating RN: Cornell Barman Primary Care Physician: Otilio Miu Other Clinician: Referring Physician: Otilio Miu Treating Physician/Extender: Frann Rider in Treatment: 1 Clinic Level of Care Assessment Items TOOL 4 Quantity Score []  - Use when only an EandM is performed on FOLLOW-UP visit 0 ASSESSMENTS - Nursing Assessment / Reassessment []  - Reassessment of Co-morbidities (includes updates  in patient status) 0 X - Reassessment of Adherence to Treatment Plan 1 5 ASSESSMENTS - Wound and Skin Assessment / Reassessment X - Simple Wound Assessment / Reassessment - one wound 1 5 []  - Complex Wound Assessment / Reassessment - multiple wounds 0 []  - Dermatologic / Skin Assessment (not related to wound area) 0 ASSESSMENTS - Focused Assessment []  - Circumferential Edema Measurements - multi extremities 0 []  - Nutritional Assessment / Counseling / Intervention 0 []  - Lower Extremity Assessment (monofilament, tuning fork, pulses) 0 []  - Peripheral Arterial Disease Assessment (using hand held doppler) 0 ASSESSMENTS - Ostomy and/or Continence Assessment and Care []  - Incontinence Assessment and Management 0 []  - Ostomy Care Assessment and Management (repouching, etc.) 0 PROCESS - Coordination of Care X - Simple Patient / Family Education for ongoing care 1 15 []  - Complex (extensive) Patient / Family Education for ongoing care 0 X - Staff obtains Programmer, systems, Records, Test Results / Process Orders 1 10 []  - Staff telephones HHA, Nursing Homes / Clarify orders / etc 0 []  - Routine Transfer to another Facility (non-emergent condition) 0 [name] ([id number]) []  - Routine Hospital Admission (non-emergent condition) 0 []  - New Admissions / Biomedical engineer / Ordering NPWT, Apligraf, etc. 0 []  - Emergency Hospital Admission (emergent condition) 0 X - Simple Discharge Coordination 1 10 []  - Complex (extensive) Discharge Coordination 0 PROCESS - Special Needs []  - Pediatric / Minor Patient Management 0 []  - Isolation Patient Management 0 []  - Hearing / Language / Visual special needs 0 []  - Assessment of Community assistance (transportation, D/C planning, etc.) 0 []  - Additional assistance / Altered mentation 0 []  - Support Surface(s) Assessment (bed, cushion, seat, etc.) 0 INTERVENTIONS - Wound Cleansing / Measurement X - Simple Wound Cleansing - one wound 1 5 []  -  Complex Wound Cleansing - multiple wounds 0 X - Wound Imaging (photographs - any number of wounds) 1 5 []  - Wound Tracing (  instead of photographs) 0 X - Simple Wound Measurement - one wound 1 5 []  - Complex Wound Measurement - multiple wounds 0 INTERVENTIONS - Wound Dressings X - Small Wound Dressing one or multiple wounds 1 10 []  - Medium Wound Dressing one or multiple wounds 0 []  - Large Wound Dressing one or multiple wounds 0 []  - Application of Medications - topical 0 []  - Application of Medications - injection 0 INTERVENTIONS - Miscellaneous []  - External ear exam 0 [name], [name] ([id number]) []  - Specimen Collection (cultures, biopsies, blood, body fluids, etc.) 0 []  - Specimen(s) / Culture(s) sent or taken to Lab for analysis 0 []  - Patient Transfer (multiple staff / Harrel Lemon Lift / Similar devices) 0 []  - Simple Staple / Suture removal (25 or less) 0 []  - Complex Staple / Suture removal (26 or more) 0 []  - Hypo / Hyperglycemic Management (close monitor of Blood Glucose) 0 []  - Ankle / Brachial Index (ABI) - do not check if billed separately 0 X - Vital Signs 1 5 Has the patient been seen at the hospital within the last three years: Yes Total Score: 75 Level Of Care: New/Established - Level 2 Electronic Signature(s) Signed: 11/11/2015 11:40:57 AM By: Gretta Cool, RN, BSN, Kim RN, BSN Entered By: Gretta Cool, RN, BSN, Kim on 11/11/2015 10:14:56 [name] ([id number]) -------------------------------------------------------------------------------- Encounter Discharge Information Details Patient Name: [name] Date of Service: 11/11/2015 10:00 AM Medical Record Number: [id number] Patient Account Number: [id number] Date of Birth/Sex: Mar 14, 1961 (55 y.o. Male) Treating RN: Cornell Barman Primary Care Physician: Otilio Miu Other Clinician: Referring Physician: Otilio Miu Treating Physician/Extender: Frann Rider in Treatment: 1 Encounter Discharge  Information Items Discharge Pain Level: 4 Discharge Condition: Stable Ambulatory Status: Ambulatory Discharge Destination: Home Transportation: Private Auto Accompanied By: self Schedule Follow-up Appointment: Yes Medication Reconciliation completed and provided to Patient/Care Yes Kyheem Bathgate: Provided on Clinical Summary of Care: 11/11/2015 Form Type Recipient Paper Patient [facility] Electronic Signature(s) Signed: 11/11/2015 10:24:20 AM By: Ruthine Dose Entered By: Ruthine Dose on 11/11/2015 10:24:19 [place], Thurman Griffin ([id number]) -------------------------------------------------------------------------------- Multi Wound Chart Details Patient Name: [name] Date of Service: 11/11/2015 10:00 AM Medical Record Number: [id number] Patient Account Number: [id number] Date of Birth/Sex: September 26, 1960 (55 y.o. Male) Treating RN: Cornell Barman Primary Care Physician: Otilio Miu Other Clinician: Referring Physician: Otilio Miu Treating Physician/Extender: Frann Rider in Treatment: 1 Vital Signs Height(in): 70 Pulse(bpm): 64 Weight(lbs): 216.7 Blood Pressure 138/82 (mmHg): Body Mass Index(BMI): 31 Temperature(F): 98.4 Respiratory Rate 18 (breaths/min): Photos: [N/A:N/A] Wound Location: Left Hand - 5th Digit - N/A N/A Distal Wounding Event: Gradually Appeared N/A N/A Primary Etiology: To be determined N/A N/A Comorbid History: Chronic Obstructive N/A N/A Pulmonary Disease (COPD), Hypertension, Lupus Erythematosus, Rheumatoid Arthritis Date Acquired: 07/28/2015 N/A N/A Weeks of Treatment: 1 N/A N/A Wound Status: Open N/A N/A Measurements L x W x D 1x1x0.1 N/A N/A (cm) Area (cm) : 0.785 N/A N/A Volume (cm) : 0.079 N/A N/A % Reduction in Area: 0.00% N/A N/A % Reduction in Volume: 0.00% N/A N/A Classification: Unclassifiable N/A N/A Exudate Amount: None Present N/A N/A Wound Margin: Flat and Intact N/A N/A Granulation Amount: None Present (0%) N/A  N/A Necrotic Amount: Large (67-100%) N/A N/A Masterson, Jshawn E. ([id number]) Necrotic Tissue: Eschar N/A N/A Exposed Structures: Fascia: No N/A N/A Fat: No Tendon: No Muscle: No Joint: No Bone: No Limited to Skin Breakdown Epithelialization: None N/A N/A Periwound Skin Texture: Callus: Yes N/A N/A Edema: No Excoriation: No Induration: No Crepitus: No  Fluctuance: No Friable: No Rash: No Scarring: No Periwound Skin Maceration: No N/A N/A Moisture: Moist: No Dry/Scaly: No Periwound Skin Color: Atrophie Blanche: No N/A N/A Cyanosis: No Ecchymosis: No Erythema: No Hemosiderin Staining: No Mottled: No Pallor: No Rubor: No Temperature: Cool/Cold N/A N/A Tenderness on Yes N/A N/A Palpation: Wound Preparation: Ulcer Cleansing: N/A N/A Rinsed/Irrigated with Saline Topical Anesthetic Applied: None Treatment Notes Electronic Signature(s) Signed: 11/11/2015 11:40:57 AM By: Gretta Cool, RN, BSN, Kim RN, BSN Entered By: Gretta Cool, RN, BSN, Kim on 11/11/2015 10:07:13 Veselka, Thurman Griffin ([id number]) -------------------------------------------------------------------------------- Multi-Disciplinary Care Plan Details Patient Name: [name] Date of Service: 11/11/2015 10:00 AM Medical Record Number: [id number] Patient Account Number: [id number] Date of Birth/Sex: [birth year] (55 y.o. Male) Treating RN: Cornell Barman Primary Care Physician: Otilio Miu Other Clinician: Referring Physician: Otilio Miu Treating Physician/Extender: Frann Rider in Treatment: 1 Active Inactive Electronic Signature(s) Signed: 01/25/2016 1:43:14 PM By: Gretta Cool, RN, BSN, Kim RN, BSN Previous Signature: 11/11/2015 11:40:57 AM Version By: Gretta Cool, RN, BSN, Kim RN, BSN Entered By: Gretta Cool, RN, BSN, Kim on 12/06/2015 09:29:38 Eline, Thurman Griffin ([id number]) -------------------------------------------------------------------------------- Pain Assessment Details Patient Name: [name] Date of Service: 11/11/2015 10:00 AM Medical Record Number: [id number] Patient Account Number: [id number] Date of Birth/Sex: 1960/12/30 (55 y.o. Male) Treating RN: Cornell Barman Primary Care Physician: Otilio Miu Other Clinician: Referring Physician: Otilio Miu Treating Physician/Extender: Frann Rider in Treatment: 1 Active Problems Location of Pain Severity and Description of Pain Patient Has Paino Yes Site Locations Pain Location: Pain in Ulcers With Dressing Change: Yes Rate the pain. Current Pain Level: 4 Pain Management and Medication Current Pain Management: Goals for Pain Management Topical or injectable lidocaine is offered to patient for acute pain when surgical debridement is performed. If needed, Patient is instructed to use over the counter pain medication for the following 24-48 hours after debridement. Wound care MDs do not prescribed pain medications. Patient has chronic pain or uncontrolled pain. Patient has been instructed to make an appointment with their Primary Care Physician for pain management. Electronic Signature(s) Signed: 11/11/2015 11:40:57 AM By: Gretta Cool, RN, BSN, Kim RN, BSN Entered By: Gretta Cool, RN, BSN, Kim on 11/11/2015 10:03:54 [place], Thurman Griffin ([id number]) -------------------------------------------------------------------------------- Patient/Caregiver Education Details Patient Name: [name] Date of Service: 11/11/2015 10:00 AM Medical Record Number: [id number] Patient Account Number: [id number] Date of Birth/Gender: 05/09/1961 (55 y.o. Male) Treating RN: Cornell Barman Primary Care Physician: Otilio Miu Other Clinician: Referring Physician: Otilio Miu Treating Physician/Extender: Frann Rider in Treatment: 1 Education Assessment Education Provided To: Patient Education Topics Provided Welcome To [facility]: Handouts: [facility] Methods: Demonstration Responses: State  content correctly Electronic Signature(s) Signed: 11/11/2015 11:40:57 AM By: Gretta Cool, RN, BSN, Kim RN, BSN Entered By: Gretta Cool, RN, BSN, Kim on 11/11/2015 10:17:27 Gelles, Thurman Griffin ([id number]) -------------------------------------------------------------------------------- Wound Assessment Details Patient Name: [name] Date of Service: 11/11/2015 10:00 AM Medical Record Number: [id number] Patient Account Number: [id number] Date of Birth/Sex: 27-Nov-1960 (55 y.o. Male) Treating RN: Cornell Barman Primary Care Physician: Otilio Miu Other Clinician: Referring Physician: Otilio Miu Treating Physician/Extender: Frann Rider in Treatment: 1 Wound Status Wound Number: 1 Primary To be determined Etiology: Wound Location: Left Hand - 5th Digit - Distal Wound Open Wounding Event: Gradually Appeared Status: Date Acquired: 07/28/2015 Comorbid Chronic Obstructive Pulmonary Disease Weeks Of Treatment: 1 History: (COPD), Hypertension, Lupus Clustered Wound: No Erythematosus, Rheumatoid Arthritis Photos Wound Measurements Length: (cm) 1 Width: (cm) 1 Depth: (cm) 0.1 Area: (cm)  0.785 Volume: (cm) 0.079 % Reduction in Area: 0% % Reduction in Volume: 0% Epithelialization: None Wound Description Classification: Unclassifiable Wound Margin: Flat and Intact Exudate Amount: None Present Wound Bed Granulation Amount: None Present (0%) Exposed Structure Necrotic Amount: Large (67-100%) Fascia Exposed: No Necrotic Quality: Eschar Fat Layer Exposed: No Tendon Exposed: No Muscle Exposed: No Joint Exposed: No Bone Exposed: No Salais, Ulysess E. ([id number]) Limited to Skin Breakdown Periwound Skin Texture Texture Color No Abnormalities Noted: No No Abnormalities Noted: No Callus: Yes Atrophie Blanche: No Crepitus: No Cyanosis: No Excoriation: No Ecchymosis: No Fluctuance: No Erythema: No Friable: No Hemosiderin Staining: No Induration: No Mottled:  No Localized Edema: No Pallor: No Rash: No Rubor: No Scarring: No Temperature / Pain Moisture Temperature: Cool/Cold No Abnormalities Noted: No Tenderness on Palpation: Yes Dry / Scaly: No Maceration: No Moist: No Wound Preparation Ulcer Cleansing: Rinsed/Irrigated with Saline Topical Anesthetic Applied: None Electronic Signature(s) Signed: 11/11/2015 11:40:57 AM By: Gretta Cool, RN, BSN, Kim RN, BSN Entered By: Gretta Cool, RN, BSN, Kim on 11/11/2015 10:06:53 Reale, Thurman Griffin ([id number]) -------------------------------------------------------------------------------- [place] Details Patient Name: [name] Date of Service: 11/11/2015 10:00 AM Medical Record Number: [id number] Patient Account Number: [id number] Date of Birth/Sex: 01-Jun-1960 (55 y.o. Male) Treating RN: Cornell Barman Primary Care Physician: Otilio Miu Other Clinician: Referring Physician: Otilio Miu Treating Physician/Extender: Frann Rider in Treatment: 1 Vital Signs Time Taken: 10:03 Temperature (F): 98.4 Height (in): 70 Pulse (bpm): 64 Weight (lbs): 216.7 Respiratory Rate (breaths/min): 18 Body Mass Index (BMI): 31.1 Blood Pressure (mmHg): 138/82 Reference Range: 80 - 120 mg / dl Electronic Signature(s) Signed: 11/11/2015 11:40:57 AM By: Gretta Cool, RN, BSN, Kim RN, BSN Entered By: Gretta Cool, RN, BSN, Kim on 11/11/2015 10:04:46

## 2015-11-11 NOTE — Progress Notes (Addendum)
[name], [name] ([id number]) Visit Report for 11/11/2015 Chief Complaint Document Details PROPHET, LEMONS 11/11/2015 10:00 Patient Name: Date of Service: E. AM Medical Record Patient Account Number: [id number] Number: Treating RN: Cornell Barman Date of Birth/Sex: 1961/02/06 (55 y.o. Male) Other Clinician: Primary Care Physician: Otilio Miu Treating Christin Fudge Referring Physician: Otilio Miu Physician/Extender: Suella Grove in Treatment: 1 Information Obtained from: Patient Chief Complaint Patient presents to the wound care center for a consult due non healing wound to the pulp of the left little finger for about 3 months Electronic Signature(s) Signed: 11/11/2015 10:19:46 AM By: Christin Fudge MD, FACS Entered By: Christin Fudge on 11/11/2015 10:19:46 Mooneyhan, Thurman Griffin ([id number]) -------------------------------------------------------------------------------- HPI Details [name] 11/11/2015 10:00 Patient Name: Date of Service: E. AM Medical Record Patient Account Number: [id number] Number: Treating RN: Cornell Barman Date of Birth/Sex: 01/01/1961 (55 y.o. Male) Other Clinician: Primary Care Physician: Otilio Miu Treating Christin Fudge Referring Physician: Otilio Miu Physician/Extender: Suella Grove in Treatment: 1 History of Present Illness Location: left little finger wound Quality: Patient reports experiencing a dull pain to affected area(s). Severity: Patient states wound are getting worse. Duration: Patient has had the wound for > 3 months prior to seeking treatment at the wound center Timing: Pain in wound is Intermittent (comes and goes Context: The wound appeared gradually over time Modifying Factors: Other treatment(s) tried include:dermatology well been giving him some steroid creams Associated Signs and Symptoms: Patient reports having:intolerance to cold and the fingers turn blue when he is out in the open HPI Description:  55 year old male with a past medical history of atrial flutter on Xarelto and status post successful TEE/DCCV, pulmonary fibrosis, rheumatoid arthritis, hypertension, cardiomyopathy with ejection fraction now increased to 55-60%, cardiac cath with normal coronary arteries. patient quit smoking 11 years ago and does not drink alcohol. His problem started about 3 months ago where he noticed that he started getting a wound on the left little finger in the pulp of it laterally and this was associated with the fingers turning blue when he is in cold weather and a dull pain throughout. Findings are typical of her Raynaud's syndrome. 11/11/2015 --was seen by Dr. Midge Griffin -- for a nonhealing wound on the left little finger concerning for a vascular etiology and a duplex showed abnormal flow in the ulnar artery which could not be palpated and he has recommended a left upper extremity angiography and possible endovascular intervention. The left upper extremity arterial duplex study done -- Summary:The ulnar artery appears small with atypical waveform. Subclavian, axillary, brachial, and radial arteries of the left upper extremityappear patent with normal waveforms. Arterial flow noted in left palmar arch. Electronic Signature(s) Signed: 11/11/2015 10:10:22 AM By: Christin Fudge MD, FACS Entered By: Christin Fudge on 11/11/2015 10:10:21 [name] ([id number]) -------------------------------------------------------------------------------- Physical Exam Details KAIN, MOREAUX 11/11/2015 10:00 Patient Name: Date of Service: E. AM Medical Record Patient Account Number: [id number] Number: Treating RN: Cornell Barman Date of Birth/Sex: [birth year] (55 y.o. Male) Other Clinician: Primary Care Physician: Otilio Miu Treating Christin Fudge Referring Physician: Otilio Miu Physician/Extender: Suella Grove in Treatment: 1 Constitutional . Pulse regular. Respirations normal and unlabored.  Afebrile. . Eyes Nonicteric. Reactive to light. Ears, Nose, Mouth, and Throat Lips, teeth, and gums WNL.[name] Moist mucosa without lesions. Neck supple and nontender. No palpable supraclavicular or cervical adenopathy. Normal sized without goiter. Respiratory WNL. No retractions.. Breath sounds WNL, No rubs, rales, rhonchi, or wheeze.. Cardiovascular Heart rhythm and rate regular, no murmur or gallop.. Pedal Pulses WNL. No  clubbing, cyanosis or edema. Chest Breasts symmetical and no nipple discharge.. Breast tissue WNL, no masses, lumps, or tenderness.. Lymphatic No adneopathy. No adenopathy. No adenopathy. Musculoskeletal Adexa without tenderness or enlargement.. Digits and nails w/o clubbing, cyanosis, infection, petechiae, ischemia, or inflammatory conditions.. Integumentary (Hair, Skin) No suspicious lesions. No crepitus or fluctuance. No peri-wound warmth or erythema. No masses.[name] Psychiatric Judgement and insight Intact.. No evidence of depression, anxiety, or agitation.. Notes the left little finger on the medial part of the pulp continues to have dry gangrene and there is no surrounding inflammation or drainage. Electronic Signature(s) Signed: 11/11/2015 10:20:17 AM By: Christin Fudge MD, FACS Entered By: Christin Fudge on 11/11/2015 10:20:16 [name] ([id number]) -------------------------------------------------------------------------------- Physician Orders Details GLEEN, NORTZ 11/11/2015 10:00 Patient Name: Date of Service: E. AM Medical Record Patient Account Number: [id number] Number: Treating RN: Cornell Barman Date of Birth/Sex: 09-22-1960 (55 y.o. Male) Other Clinician: Primary Care Physician: Otilio Miu Treating Christin Fudge Referring Physician: Otilio Miu Physician/Extender: Suella Grove in Treatment: 1 Verbal / Phone Orders: Yes Clinician: Cornell Barman Read Back and Verified: Yes Diagnosis Coding Wound Cleansing Wound #1 Left,Distal Hand  - 5th Digit o Clean wound with Normal Saline. Anesthetic Wound #1 Left,Distal Hand - 5th Digit o Topical Lidocaine 4% cream applied to wound bed prior to debridement Primary Wound Dressing Wound #1 Left,Distal Hand - 5th Digit o Other: - Betadine and self adherent dressing Dressing Change Frequency Wound #1 Left,Distal Hand - 5th Digit o Change dressing every day. Follow-up Appointments Wound #1 Left,Distal Hand - 5th Digit o Return Appointment in 1 week. Electronic Signature(s) Signed: 11/11/2015 11:40:57 AM By: Gretta Cool, RN, BSN, Kim RN, BSN Signed: 11/11/2015 3:21:55 PM By: Christin Fudge MD, FACS Entered By: Gretta Cool RN, BSN, Kim on 11/11/2015 10:13:09 Dimattia, Thurman Griffin ([id number]) -------------------------------------------------------------------------------- Problem List Details AIDIN, DOBOS 11/11/2015 10:00 Patient Name: Date of Service: E. AM Medical Record Patient Account Number: [id number] Number: Treating RN: Cornell Barman Date of Birth/Sex: 1960-12-28 (55 y.o. Male) Other Clinician: Primary Care Physician: Otilio Miu Treating Christin Fudge Referring Physician: Otilio Miu Physician/Extender: Suella Grove in Treatment: 1 Active Problems ICD-10 Encounter Code Description Active Date Diagnosis S61.207A Unspecified open wound of left little finger without 11/04/2015 Yes damage to nail, initial encounter S65.507A Unspecified injury of blood vessel of left little finger, initial 11/04/2015 Yes encounter I73.01 Raynaud's syndrome with gangrene 11/04/2015 Yes Inactive Problems Resolved Problems Electronic Signature(s) Signed: 11/11/2015 10:19:41 AM By: Christin Fudge MD, FACS Entered By: Christin Fudge on 11/11/2015 10:19:41 Rettinger, Thurman Griffin ([id number]) -------------------------------------------------------------------------------- Progress Note Details Hass, Karson 11/11/2015 10:00 Patient Name: Date of Service: E. AM Medical Record Patient  Account Number: [id number] Number: Treating RN: Cornell Barman Date of Birth/Sex: [birth year] (55 y.o. Male) Other Clinician: Primary Care Physician: Otilio Miu Treating Ger Ringenberg Referring Physician: Otilio Miu Physician/Extender: Suella Grove in Treatment: 1 Subjective Chief Complaint Information obtained from Patient Patient presents to the wound care center for a consult due non healing wound to the pulp of the left little finger for about 3 months History of Present Illness (HPI) The following HPI elements were documented for the patient's wound: Location: left little finger wound Quality: Patient reports experiencing a dull pain to affected area(s). Severity: Patient states wound are getting worse. Duration: Patient has had the wound for > 3 months prior to seeking treatment at the wound center Timing: Pain in wound is Intermittent (comes and goes Context: The wound appeared gradually over time Modifying Factors: Other treatment(s) tried include:dermatology well been giving him  some steroid creams Associated Signs and Symptoms: Patient reports having:intolerance to cold and the fingers turn blue when he is out in the open 55 year old male with a past medical history of atrial flutter on Xarelto and status post successful TEE/DCCV, pulmonary fibrosis, rheumatoid arthritis, hypertension, cardiomyopathy with ejection fraction now increased to 55-60%, cardiac cath with normal coronary arteries. patient quit smoking 11 years ago and does not drink alcohol. His problem started about 3 months ago where he noticed that he started getting a wound on the left little finger in the pulp of it laterally and this was associated with the fingers turning blue when he is in cold weather and a dull pain throughout. Findings are typical of her Raynaud's syndrome. 11/11/2015 --was seen by Dr. Midge Griffin -- for a nonhealing wound on the left little finger concerning for a vascular  etiology and a duplex showed abnormal flow in the ulnar artery which could not be palpated and he has recommended a left upper extremity angiography and possible endovascular intervention. The left upper extremity arterial duplex study done -- Summary:The ulnar artery appears small with atypical waveform. Subclavian, axillary, brachial, and radial arteries of the left upper extremityappear patent with normal waveforms. Arterial flow noted in left palmar arch. FONTAINE, BEATO ([id number]) Objective Constitutional Pulse regular. Respirations normal and unlabored. Afebrile. Vitals Time Taken: 10:03 AM, Height: 70 in, Weight: 216.7 lbs, BMI: 31.1, Temperature: 98.4 F, Pulse: 64 bpm, Respiratory Rate: 18 breaths/min, Blood Pressure: 138/82 mmHg. Eyes Nonicteric. Reactive to light. Ears, Nose, Mouth, and Throat Lips, teeth, and gums WNL.[name] Moist mucosa without lesions. Neck supple and nontender. No palpable supraclavicular or cervical adenopathy. Normal sized without goiter. Respiratory WNL. No retractions.. Breath sounds WNL, No rubs, rales, rhonchi, or wheeze.. Cardiovascular Heart rhythm and rate regular, no murmur or gallop.. Pedal Pulses WNL. No clubbing, cyanosis or edema. Chest Breasts symmetical and no nipple discharge.. Breast tissue WNL, no masses, lumps, or tenderness.. Lymphatic No adneopathy. No adenopathy. No adenopathy. Musculoskeletal Adexa without tenderness or enlargement.. Digits and nails w/o clubbing, cyanosis, infection, petechiae, ischemia, or inflammatory conditions.[name] Psychiatric Judgement and insight Intact.. No evidence of depression, anxiety, or agitation.. General Notes: the left little finger on the medial part of the pulp continues to have dry gangrene and there is no surrounding inflammation or drainage. Integumentary (Hair, Skin) No suspicious lesions. No crepitus or fluctuance. No peri-wound warmth or erythema. No masses.. Wound #1 status is Open.  Original cause of wound was Gradually Appeared. The wound is located on the Left,Distal Hand - 5th Digit. The wound measures 1cm length x 1cm width x 0.1cm depth; 0.785cm^2 area and 0.079cm^3 volume. The wound is limited to skin breakdown. There is a none present amount of drainage noted. The wound margin is flat and intact. There is no granulation within the wound bed. There is a large (67-100%) amount of necrotic tissue within the wound bed including Eschar. The periwound skin Wirtanen, Jeffren E. ([id number]) appearance exhibited: Callus. The periwound skin appearance did not exhibit: Crepitus, Excoriation, Fluctuance, Friable, Induration, Localized Edema, Rash, Scarring, Dry/Scaly, Maceration, Moist, Atrophie Blanche, Cyanosis, Ecchymosis, Hemosiderin Staining, Mottled, Pallor, Rubor, Erythema. Periwound temperature was noted as Cool/Cold. The periwound has tenderness on palpation. Assessment Active Problems ICD-10 S61.207A - Unspecified open wound of left little finger without damage to nail, initial encounter S65.507A - Unspecified injury of blood vessel of left little finger, initial encounter I73.01 - Raynaud's syndrome with gangrene I have discussed the noninvasive arterial studies with him and have  recommended he keeps his appointment to get the angiogram done by Dr. Fletcher Anon this coming week. He will get an x-ray of the left hand to make sure that there is no bony involvement and continue with local care with Betadine paint and cover this with a appropriate foam bandage. We will see him back next week with his angiogram review and further discuss wound closure options Plan Wound Cleansing: Wound #1 Left,Distal Hand - 5th Digit: Clean wound with Normal Saline. Anesthetic: Wound #1 Left,Distal Hand - 5th Digit: Topical Lidocaine 4% cream applied to wound bed prior to debridement Primary Wound Dressing: Wound #1 Left,Distal Hand - 5th Digit: Other: - Betadine and self adherent  dressing Dressing Change Frequency: Wound #1 Left,Distal Hand - 5th Digit: Change dressing every day. Follow-up Appointments: Wound #1 Left,Distal Hand - 5th Digit: Return Appointment in 1 week. Achenbach, ADARRYL TROTTIER ([id number]) I have discussed the noninvasive arterial studies with him and have recommended he keeps his appointment to get the angiogram done by Dr. Fletcher Anon this coming week. He will get an x-ray of the left hand to make sure that there is no bony involvement and continue with local care with Betadine paint and cover this with a appropriate foam bandage. We will see him back next week with his angiogram review and further discuss wound closure options Electronic Signature(s) Signed: 11/11/2015 10:21:34 AM By: Christin Fudge MD, FACS Entered By: Christin Fudge on 11/11/2015 10:21:34 Zirkelbach, Thurman Griffin ([id number]) -------------------------------------------------------------------------------- SuperBill Details Patient Name: Seymour Bars Date of Service: 11/11/2015 Medical Record Number: [id number] Patient Account Number: [id number] Date of Birth/Sex: 1961-04-09 (55 y.o. Male) Treating RN: Cornell Barman Primary Care Physician: Otilio Miu Other Clinician: Referring Physician: Otilio Miu Treating Physician/Extender: Frann Rider in Treatment: 1 Diagnosis Coding ICD-10 Codes Code Description [phone] Unspecified open wound of left little finger without damage to nail, initial encounter S65.507A Unspecified injury of blood vessel of left little finger, initial encounter I73.01 Raynaud's syndrome with gangrene Facility Procedures CPT4 Code: [id number] Description: [id number] - WOUND CARE VISIT-LEV 2 EST PT Modifier: Quantity: 1 Physician Procedures CPT4: Description Modifier Quantity Code [id number] - WC PHYS LEVEL 3 - EST PT 1 ICD-10 Description Diagnosis S61.207A Unspecified open wound of left little finger without damage to nail, initial encounter I73.01  Raynaud's syndrome with gangrene  S65.507A Unspecified injury of blood vessel of left little finger, initial encounter Electronic Signature(s) Signed: 11/11/2015 10:21:54 AM By: Christin Fudge MD, FACS Entered By: Christin Fudge on 11/11/2015 10:21:54

## 2015-11-16 ENCOUNTER — Telehealth: Payer: Self-pay | Admitting: Cardiovascular Disease

## 2015-11-16 NOTE — Telephone Encounter (Signed)
Reviewed instructions for June 21 LUE angiography. Pt confirmed he has held eliquis as of June 19 per MD instructions. He is agreeable for 6/26, 9:30am echo appt. Pt verbalized understanding of all information w/no questions at this time.

## 2015-11-16 NOTE — Telephone Encounter (Signed)
Call pt to review instructions for LUE angiography. Left message on machine for patient to contact the office.   Echo appt changed to June 30, 3pm

## 2015-11-17 ENCOUNTER — Encounter (HOSPITAL_COMMUNITY)
Admission: RE | Disposition: A | Discharge status: Home / Self Care | Payer: Self-pay | Source: Ambulatory Visit | Attending: Cardiovascular Disease

## 2015-11-17 ENCOUNTER — Other Ambulatory Visit: Payer: 59

## 2015-11-17 ENCOUNTER — Ambulatory Visit (HOSPITAL_COMMUNITY)
Admission: RE | Admit: 2015-11-17 | Discharge: 2015-11-17 | Disposition: A | Discharge status: Home / Self Care | Payer: 59 | Source: Ambulatory Visit | Attending: Cardiovascular Disease | Admitting: Cardiovascular Disease

## 2015-11-17 ENCOUNTER — Encounter (HOSPITAL_COMMUNITY): Payer: Self-pay | Admitting: Cardiovascular Disease

## 2015-11-17 DIAGNOSIS — Z7901 Long term (current) use of anticoagulants: Secondary | ICD-10-CM | POA: Diagnosis not present

## 2015-11-17 DIAGNOSIS — I350 Nonrheumatic aortic (valve) stenosis: Secondary | ICD-10-CM | POA: Insufficient documentation

## 2015-11-17 DIAGNOSIS — Z7952 Long term (current) use of systemic steroids: Secondary | ICD-10-CM | POA: Diagnosis not present

## 2015-11-17 DIAGNOSIS — Z6831 Body mass index (BMI) 31.0-31.9, adult: Secondary | ICD-10-CM | POA: Diagnosis not present

## 2015-11-17 DIAGNOSIS — I7301 Raynaud's syndrome with gangrene: Secondary | ICD-10-CM | POA: Diagnosis not present

## 2015-11-17 DIAGNOSIS — M069 Rheumatoid arthritis, unspecified: Secondary | ICD-10-CM | POA: Diagnosis not present

## 2015-11-17 DIAGNOSIS — Z87891 Personal history of nicotine dependence: Secondary | ICD-10-CM | POA: Insufficient documentation

## 2015-11-17 DIAGNOSIS — I70268 Atherosclerosis of native arteries of extremities with gangrene, other extremity: Secondary | ICD-10-CM | POA: Insufficient documentation

## 2015-11-17 DIAGNOSIS — Z01812 Encounter for preprocedural laboratory examination: Secondary | ICD-10-CM

## 2015-11-17 DIAGNOSIS — E669 Obesity, unspecified: Secondary | ICD-10-CM | POA: Insufficient documentation

## 2015-11-17 DIAGNOSIS — I96 Gangrene, not elsewhere classified: Secondary | ICD-10-CM

## 2015-11-17 DIAGNOSIS — J8410 Pulmonary fibrosis, unspecified: Secondary | ICD-10-CM | POA: Diagnosis not present

## 2015-11-17 DIAGNOSIS — I10 Essential (primary) hypertension: Secondary | ICD-10-CM | POA: Insufficient documentation

## 2015-11-17 HISTORY — PX: PERIPHERAL VASCULAR CATHETERIZATION: SHX172C

## 2015-11-17 LAB — PROTIME-INR
INR: 1.03 (ref 0.00–1.49)
PROTHROMBIN TIME: 13.7 s (ref 11.6–15.2)

## 2015-11-17 SURGERY — UPPER EXTREMITY ANGIOGRAPHY

## 2015-11-17 MED ORDER — IOPAMIDOL (ISOVUE-370) INJECTION 76%: INTRAVENOUS | Status: DC | PRN

## 2015-11-17 MED ORDER — SODIUM CHLORIDE 0.9% FLUSH: 3.0000 mL | INTRAVENOUS | Status: DC | PRN

## 2015-11-17 MED ORDER — SODIUM CHLORIDE 0.9 % IV SOLN: 250.0000 mL | INTRAVENOUS | Status: DC | PRN

## 2015-11-17 MED ORDER — IODIXANOL 320 MG/ML IV SOLN
INTRAVENOUS | Status: DC | PRN
  Administered 2015-11-17: 65 mL via INTRA_ARTERIAL

## 2015-11-17 MED ORDER — NITROGLYCERIN 1 MG/10 ML FOR IR/CATH LAB
INTRA_ARTERIAL | Status: DC | PRN
  Administered 2015-11-17: 300 ug via INTRA_ARTERIAL

## 2015-11-17 MED ORDER — ASPIRIN 81 MG PO CHEW
81.0000 mg | CHEWABLE_TABLET | ORAL | Status: AC
  Administered 2015-11-17: 81 mg via ORAL

## 2015-11-17 MED ORDER — SODIUM CHLORIDE 0.9 % IV SOLN
INTRAVENOUS | Status: DC
  Administered 2015-11-17: 08:00:00 via INTRAVENOUS

## 2015-11-17 MED ORDER — LIDOCAINE HCL (PF) 1 % IJ SOLN
INTRAMUSCULAR | Status: AC
  Filled 2015-11-17: qty 30

## 2015-11-17 MED ORDER — ASPIRIN 81 MG PO CHEW
CHEWABLE_TABLET | ORAL | Status: AC
  Filled 2015-11-17: qty 1

## 2015-11-17 MED ORDER — FENTANYL CITRATE (PF) 100 MCG/2ML IJ SOLN
INTRAMUSCULAR | Status: AC
  Filled 2015-11-17: qty 2

## 2015-11-17 MED ORDER — NITROGLYCERIN 1 MG/10 ML FOR IR/CATH LAB
INTRA_ARTERIAL | Status: AC
  Filled 2015-11-17: qty 10

## 2015-11-17 MED ORDER — SODIUM CHLORIDE 0.9% FLUSH
3.0000 mL | Freq: Two times a day (BID) | INTRAVENOUS | Status: DC
Start: 2015-11-17 — End: 2015-11-17

## 2015-11-17 MED ORDER — LIDOCAINE HCL (PF) 1 % IJ SOLN
INTRAMUSCULAR | Status: DC | PRN
  Administered 2015-11-17: 25 mL

## 2015-11-17 MED ORDER — SODIUM CHLORIDE 0.9 % WEIGHT BASED INFUSION: 1.0000 mL/kg/h | INTRAVENOUS | Status: DC

## 2015-11-17 MED ORDER — MIDAZOLAM HCL 2 MG/2ML IJ SOLN
INTRAMUSCULAR | Status: AC
  Filled 2015-11-17: qty 2

## 2015-11-17 MED ORDER — HEPARIN (PORCINE) IN NACL 2-0.9 UNIT/ML-% IJ SOLN
INTRAMUSCULAR | Status: DC | PRN
  Administered 2015-11-17: 1000 mL

## 2015-11-17 MED ORDER — SODIUM CHLORIDE 0.9% FLUSH: 3.0000 mL | Freq: Two times a day (BID) | INTRAVENOUS | Status: DC

## 2015-11-17 MED ORDER — MIDAZOLAM HCL 2 MG/2ML IJ SOLN
INTRAMUSCULAR | Status: DC | PRN
  Administered 2015-11-17: 1 mg via INTRAVENOUS

## 2015-11-17 MED ORDER — FENTANYL CITRATE (PF) 100 MCG/2ML IJ SOLN
INTRAMUSCULAR | Status: DC | PRN
  Administered 2015-11-17: 50 ug via INTRAVENOUS

## 2015-11-17 SURGICAL SUPPLY — 14 items
CATH ANGIO 5F PIGTAIL 100CM (CATHETERS) ×2 IMPLANT
CATH INFINITI JR4 5F (CATHETERS) ×2 IMPLANT
CATH TEMPO AQUA 5F 100CM (CATHETERS) ×2 IMPLANT
DEVICE CLOSURE MYNXGRIP 5F (Vascular Products) ×2 IMPLANT
KIT PV (KITS) ×2 IMPLANT
SHEATH PINNACLE 5F 10CM (SHEATH) ×2 IMPLANT
STOPCOCK MORSE 400PSI 3WAY (MISCELLANEOUS) ×2 IMPLANT
SYR MEDRAD MARK V 150ML (SYRINGE) ×2 IMPLANT
SYRINGE MEDRAD AVANTA MACH 7 (SYRINGE) ×2 IMPLANT
TRANSDUCER W/STOPCOCK (MISCELLANEOUS) ×2 IMPLANT
TRAY PV CATH (CUSTOM PROCEDURE TRAY) ×2 IMPLANT
TUBING CIL FLEX 10 FLL-RA (TUBING) ×2 IMPLANT
WIRE HITORQ VERSACORE ST 145CM (WIRE) ×2 IMPLANT
WIRE VERSACORE LOC 115CM (WIRE) ×2 IMPLANT

## 2015-11-17 NOTE — Discharge Instructions (Signed)
Resume Xarelto tomorrow as long as no signs of bleeding from right groin.  Angiogram, Care After Refer to this sheet in the next few weeks. These instructions provide you with information about caring for yourself after your procedure. Your health care provider may also give you more specific instructions. Your treatment has been planned according to current medical practices, but problems sometimes occur. Call your health care provider if you have any problems or questions after your procedure. WHAT TO EXPECT AFTER THE PROCEDURE After your procedure, it is typical to have the following:  Bruising at the catheter insertion site that usually fades within 1-2 weeks.  Blood collecting in the tissue (hematoma) that may be painful to the touch. It should usually decrease in size and tenderness within 1-2 weeks. HOME CARE INSTRUCTIONS  Take medicines only as directed by your health care provider.  You may shower 24-48 hours after the procedure or as directed by your health care provider. Remove the bandage (dressing) and gently wash the site with plain soap and water. Pat the area dry with a clean towel. Do not rub the site, because this may cause bleeding.  Do not take baths, swim, or use a hot tub until your health care provider approves.  Check your insertion site every day for redness, swelling, or drainage.  Do not apply powder or lotion to the site.  Do not lift over 10 lb (4.5 kg) for 5 days after your procedure or as directed by your health care provider.  Ask your health care provider when it is okay to:  Return to work or school.  Resume usual physical activities or sports.  Resume sexual activity.  Do not drive home if you are discharged the same day as the procedure. Have someone else drive you.  You may drive 24 hours after the procedure unless otherwise instructed by your health care provider.  Do not operate machinery or power tools for 24 hours after the procedure or as  directed by your health care provider.  If your procedure was done as an outpatient procedure, which means that you went home the same day as your procedure, a responsible adult should be with you for the first 24 hours after you arrive home.  Keep all follow-up visits as directed by your health care provider. This is important. SEEK MEDICAL CARE IF:  You have a fever.  You have chills.  You have increased bleeding from the catheter insertion site. Hold pressure on the site. CALL 911 SEEK IMMEDIATE MEDICAL CARE IF:  You have unusual pain at the catheter insertion site.  You have redness, warmth, or swelling at the catheter insertion site.  You have drainage (other than a small amount of blood on the dressing) from the catheter insertion site.  The catheter insertion site is bleeding, and the bleeding does not stop after 30 minutes of holding steady pressure on the site.  The area near or just beyond the catheter insertion site becomes pale, cool, tingly, or numb.   This information is not intended to replace advice given to you by your health care provider. Make sure you discuss any questions you have with your health care provider.   Document Released: 12/01/2004 Document Revised: 06/05/2014 Document Reviewed: 10/16/2012 Elsevier Interactive Patient Education 2016 [place] from Work, [employer], or Physical Activity [name] needs to be excused from: __XX___ Work _____ [employer] _____ Physical activity beginning now and through the  following date: ____monday _6/26/17_______________. _____ He or she may return to work or school but should still avoid the following physical activity or activities from now until ____________________. Activity restrictions include: _____ Lifting more than _______ lb _____ Sitting longer than __________ minutes at a time _____ Standing longer than ________ minutes at a time _____ He or she may return to full  physical activity as of ____________________. Health Care Provider Name (printed): _Dr Fletcher Anon _______________________________________  Health Care Provider (signature): ___________________________________________ Date: _6/21/17_______________   This information is not intended to replace advice given to you by your health care provider. Make sure you discuss any questions you have with your health care provider.   Document Released: 11/08/2000 Document Revised: 06/05/2014 Document Reviewed: 12/15/2013 Elsevier Interactive Patient Education [employer].

## 2015-11-17 NOTE — Interval H&P Note (Signed)
History and Physical Interval Note:  11/17/2015 10:05 AM  [name]  has presented today for surgery, with the diagnosis of grengreen left finger  The various methods of treatment have been discussed with the patient and family. After consideration of risks, benefits and other options for treatment, the patient has consented to  Procedure(s): Upper Extremity Angiography (N/A) as a surgical intervention .  The patient's history has been reviewed, patient examined, no change in status, stable for surgery.  I have reviewed the patient's chart and labs.  Questions were answered to the patient's satisfaction.     Kathlyn [place]

## 2015-11-17 NOTE — H&P (View-Only) (Signed)
Cardiology Office Note   Date:  11/08/2015   ID:  [name], DOB [birth year], MRN [id number]  PCP:  Otilio Miu, MD  Cardiologist:  Dr. Rockey Situ  Chief Complaint  Patient presents with  . other    Follow up from gangrene on finger and discuss the Upper Extremity Arterial Duplex Study. Meds reviewed by the patient verbally. "doing well."       History of Present Illness: [name] is a 55 y.o. male who was referred by Dr. Con Memos from the wound center for evaluation of nonhealing wound at the tip of the left little finger. The patient is followed by Dr.Gollan  for his chronic cardiac medical conditions. 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   the patient reports hand sensitivity to cold which started about 2 years ago and has been worsening. His fingers turn blue and her with significant pain when he is exposed to cold weather. He usually does better in the summertime. He developed an ulceration on the tip of the left small finger about 3 months ago which has gradually worsened with early gangrenous changes. No trauma.   he underwent upper extremity duplex which showed atypical flow in the ulnar artery and normal flow through the radial artery. The patient reports symptoms of substernal burning sensation over the last 1-2 years as well. He is known to have rheumatoid arthritis followed by Dr. Jefm Bryant. He is currently on prednisone.   Past Medical History  Diagnosis Date  . Atrial flutter ([place])     a. on Xarelto; b. s/p successful TEE/DCCV on 08/28/2014  . Pulmonary fibrosis ([place])     a. not on home oxygen  . RA (rheumatoid arthritis) ([place])   . HTN (hypertension)   .  Tachycardia-induced cardiomyopathy ([place])     a. echo 07/2014: EF 20-25%, cannot exclude atrial flutter,  global HK, possible bicuspid Ao valve, mod reduced RV sys fxn, mild-mod aortic valve sclerosis/calcification, mod TR, mildly elevated RVSP; b. TEE 08/28/2014: EF 30-35%, no intracardic thrombus, mildly dilated LA/RA, mild TR, mild-mod aortic sclerosis w/o stenosis; c. echo 09/2014: EF 55-60%, select images w/ inf HK  . History of nuclear stress test     a. 09/02/2014: no sig ischemia, GI uptake noted, no sig WMA, EF 48%, no EKG chanes concerning for ischemia, low risk scan    . Normal coronary arteries     a. cardiac cath 12/03/2014: no sigificant CAD, right dominant system, LVEF >55%, no MR or AS  . Gangrene of finger [facility])     Past Surgical History  Procedure Laterality Date  . Cardiac catheterization N/A 12/03/2014    Procedure: Left Heart Cath and Coronary Angiography;  Surgeon: Minna Merritts, MD;  Location: [facility];  Service: Cardiovascular;  Laterality: N/A;     Current Outpatient Prescriptions  Medication Sig Dispense Refill  . diltiazem (CARDIZEM) 30 MG tablet Take 1 tablet (30 mg total) by mouth 4 (four) times daily. Every 6 hours as needed for heart rate greater than 90 bpm 30 tablet 0  . enalapril (VASOTEC) 20 MG tablet TAKE 1 TABLET BY MOUTH EVERY DAY 30 tablet 6  . fluticasone (FLONASE) 50 MCG/ACT nasal spray Place 2 sprays into both nostrils daily. 16 g 0  . hydrochlorothiazide (MICROZIDE)  12.5 MG capsule Take 1 capsule (12.5 mg total) by mouth daily. 90 capsule 1  . meloxicam (MOBIC) 15 MG tablet Take 1 tablet (15 mg total) by mouth daily. 30 tablet 0  . metoprolol (LOPRESSOR) 50 MG tablet TAKE 1 AND 1/2 TABLETS BY MOUTH TWICE DAILY 90 tablet 6  . predniSONE (DELTASONE) 5 MG tablet Take 5 mg by mouth daily.    . VENTOLIN HFA 108 (90 Base) MCG/ACT inhaler INHALE 2 PUFFS BY MOUTH 4 TIMES DAILY 18 Inhaler 0  . XARELTO 20 MG TABS tablet TAKE 1 TABLET (20 MG TOTAL) BY  MOUTH EVERY MORNING. 30 tablet 3   Current Facility-Administered Medications  Medication Dose Route Frequency Provider Last Rate Last Dose  . triamcinolone acetonide (KENALOG) 10 MG/ML injection 10 mg  10 mg Other Once [employer], DPM      . triamcinolone acetonide (KENALOG) 10 MG/ML injection 10 mg  10 mg Other Once [employer], DPM        Allergies:   Review of patient's allergies indicates no known allergies.    Social History:  The patient  reports that he quit smoking about 11 years ago. His smoking use included Cigarettes. He has a 20 pack-year smoking history. He has never used smokeless tobacco. He reports that he does not drink alcohol or use illicit drugs.   Family History:  The patient's family history includes Leukemia in his mother.    ROS:  Please see the history of present illness.   Otherwise, review of systems are positive for none.   All other systems are reviewed and negative.    PHYSICAL EXAM: VS:  BP 158/90 mmHg  Pulse 66  Ht 5\' 10"  (1.778 m)  Wt 217 lb 8 oz (98.657 kg)  BMI 31.21 kg/m2 , BMI Body mass index is 31.21 kg/(m^2). GEN: Well nourished, well developed, in no acute distress HEENT: normal Neck: no JVD, carotid bruits, or masses Cardiac: RRR; no murmurs, rubs, or gallops,no edema  Respiratory:  clear to auscultation bilaterally, normal work of breathing GI: soft, nontender, nondistended, + BS MS: no deformity or atrophy Skin: warm and dry, no rash Neuro:  Strength and sensation are intact Psych: euthymic mood, full affect  vascular: Radial pulses normal bilaterally. Ulnar pulse is +1 on the right and absent on the left side. Lower extremity pulses are normal.  EKG:  EKG is not ordered today.    Recent Labs: 12/01/2014: Magnesium 1.7 11/08/2015: BUN 19; Creatinine, Ser 0.94; Hemoglobin 14.8; Platelets 205; Potassium 3.5; Sodium 139    Lipid Panel    Component Value Date/Time   CHOL 88 08/25/2014 0110   TRIG 124 08/25/2014 0110   HDL  24* 08/25/2014 0110   VLDL 25 08/25/2014 0110   LDLCALC 39 08/25/2014 0110      Wt Readings from Last 3 Encounters:  11/08/15 217 lb 8 oz (98.657 kg)  10/01/15 217 lb 4 oz (98.544 kg)  06/10/15 224 lb (101.606 kg)        ASSESSMENT AND PLAN:  1.  Nonhealing wound on the left small finger: Concerning for a vascular etiology. Less likely to be embolic from atrial flutter given that he has been maintaining in sinus rhythm and has been taking anticoagulation on a consistent basis. Duplex showed abnormal flow in the ulnar artery and I cannot palpate his ulnar pulse.   due to this, I recommended proceeding with left upper extremity angiography and possible endovascular intervention. I discussed risks and benefits with him.  2. Raynaud's syndrome with gangrene: This could be the reason for his nonhealing wound. The patient has symptoms of GERD and does have known history of rheumatoid arthritis. This raises the possibility of CREST syndrome. I advised him to schedule a follow-up with Dr. Jefm Bryant to discuss this.   3. Exertional dyspnea: Cardiac cath in July 2016 showed no significant coronary artery disease with normal ejection fraction. I requested an echocardiogram to make sure he does not have pulmonary hypertension.  4. Atrial flutter: No evidence of recurrent arrhythmia. He is tolerating anticoagulation.    Disposition:   FU with me in 1 month  Signed,  Kathlyn Sacramento, MD  11/08/2015 4:12 PM    [facility]

## 2015-11-18 ENCOUNTER — Ambulatory Visit: Payer: 59 | Admitting: Surgery

## 2015-11-19 ENCOUNTER — Telehealth: Payer: Self-pay | Admitting: Cardiovascular Disease

## 2015-11-19 NOTE — Telephone Encounter (Signed)
Attempted to contact pt. No answer, VM is full.

## 2015-11-19 NOTE — Telephone Encounter (Signed)
Patient called pcp and c/o lightheadedness and dizziness.  Patient has had them symptoms before and spent time in the hospital.  Nurse Baxter Flattery at pcp suggested going to er and patient said he would be ok because he has an appt on Monday.  Patient is having an echo in our office.  Nurse wants someone to call patient and encourage going to er given current symptoms .  Please call patient.

## 2015-11-21 ENCOUNTER — Telehealth: Payer: Self-pay | Admitting: Physician Assistant

## 2015-11-21 NOTE — Telephone Encounter (Signed)
    Patient feels like HR is erratic and not feeling well. Has taken extra diltiazem but whenever her gets up to walk around he feels badly and HR spikes. He has a previously scheduled echocardiogram in [facility] tomorrow but would like to be seen by a provider. I will send a note to [facility] office to see if he can get an ECG or possibly an appointment. If they cannot accomodate that, there are two [facility] appt openings for same day appts on Monday with Truitt Merle NP which he could possibly take. He doesn't feel like he can go to work under these circumstances.    Angelena Form PA-C  [facility]

## 2015-11-22 ENCOUNTER — Ambulatory Visit (INDEPENDENT_AMBULATORY_CARE_PROVIDER_SITE_OTHER): Payer: 59

## 2015-11-22 ENCOUNTER — Encounter: Payer: Self-pay | Admitting: Emergency Medicine

## 2015-11-22 ENCOUNTER — Emergency Department: Payer: 59

## 2015-11-22 ENCOUNTER — Other Ambulatory Visit: Payer: Self-pay

## 2015-11-22 ENCOUNTER — Ambulatory Visit: Payer: 59 | Admitting: Family Medicine

## 2015-11-22 ENCOUNTER — Inpatient Hospital Stay
Admission: EM | Admit: 2015-11-22 | Discharge: 2015-11-30 | DRG: 809 | Disposition: A | Discharge status: Home / Self Care | Payer: 59 | Attending: Internal Medicine | Admitting: Internal Medicine

## 2015-11-22 DIAGNOSIS — Z4659 Encounter for fitting and adjustment of other gastrointestinal appliance and device: Secondary | ICD-10-CM

## 2015-11-22 DIAGNOSIS — Z87891 Personal history of nicotine dependence: Secondary | ICD-10-CM

## 2015-11-22 DIAGNOSIS — Z791 Long term (current) use of non-steroidal anti-inflammatories (NSAID): Secondary | ICD-10-CM

## 2015-11-22 DIAGNOSIS — D649 Anemia, unspecified: Secondary | ICD-10-CM | POA: Diagnosis present

## 2015-11-22 DIAGNOSIS — J8410 Pulmonary fibrosis, unspecified: Secondary | ICD-10-CM | POA: Diagnosis present

## 2015-11-22 DIAGNOSIS — Z7952 Long term (current) use of systemic steroids: Secondary | ICD-10-CM | POA: Diagnosis not present

## 2015-11-22 DIAGNOSIS — K449 Diaphragmatic hernia without obstruction or gangrene: Secondary | ICD-10-CM | POA: Diagnosis present

## 2015-11-22 DIAGNOSIS — I10 Essential (primary) hypertension: Secondary | ICD-10-CM | POA: Diagnosis not present

## 2015-11-22 DIAGNOSIS — R319 Hematuria, unspecified: Secondary | ICD-10-CM | POA: Diagnosis present

## 2015-11-22 DIAGNOSIS — D5919 Other autoimmune hemolytic anemia: Secondary | ICD-10-CM

## 2015-11-22 DIAGNOSIS — I499 Cardiac arrhythmia, unspecified: Secondary | ICD-10-CM

## 2015-11-22 DIAGNOSIS — K641 Second degree hemorrhoids: Secondary | ICD-10-CM | POA: Diagnosis present

## 2015-11-22 DIAGNOSIS — I7300 Raynaud's syndrome without gangrene: Secondary | ICD-10-CM | POA: Diagnosis present

## 2015-11-22 DIAGNOSIS — K209 Esophagitis, unspecified without bleeding: Secondary | ICD-10-CM | POA: Insufficient documentation

## 2015-11-22 DIAGNOSIS — Z7901 Long term (current) use of anticoagulants: Secondary | ICD-10-CM

## 2015-11-22 DIAGNOSIS — D509 Iron deficiency anemia, unspecified: Secondary | ICD-10-CM | POA: Diagnosis not present

## 2015-11-22 DIAGNOSIS — E876 Hypokalemia: Secondary | ICD-10-CM | POA: Diagnosis present

## 2015-11-22 DIAGNOSIS — I493 Ventricular premature depolarization: Secondary | ICD-10-CM | POA: Diagnosis present

## 2015-11-22 DIAGNOSIS — Z806 Family history of leukemia: Secondary | ICD-10-CM

## 2015-11-22 DIAGNOSIS — I483 Typical atrial flutter: Secondary | ICD-10-CM | POA: Diagnosis not present

## 2015-11-22 DIAGNOSIS — K5730 Diverticulosis of large intestine without perforation or abscess without bleeding: Secondary | ICD-10-CM | POA: Diagnosis present

## 2015-11-22 DIAGNOSIS — M069 Rheumatoid arthritis, unspecified: Secondary | ICD-10-CM | POA: Diagnosis present

## 2015-11-22 DIAGNOSIS — R0602 Shortness of breath: Secondary | ICD-10-CM | POA: Diagnosis not present

## 2015-11-22 DIAGNOSIS — L930 Discoid lupus erythematosus: Secondary | ICD-10-CM | POA: Diagnosis present

## 2015-11-22 DIAGNOSIS — D589 Hereditary hemolytic anemia, unspecified: Secondary | ICD-10-CM | POA: Insufficient documentation

## 2015-11-22 DIAGNOSIS — D591 Other autoimmune hemolytic anemias: Principal | ICD-10-CM | POA: Diagnosis present

## 2015-11-22 DIAGNOSIS — R531 Weakness: Secondary | ICD-10-CM | POA: Diagnosis not present

## 2015-11-22 DIAGNOSIS — D599 Acquired hemolytic anemia, unspecified: Secondary | ICD-10-CM

## 2015-11-22 DIAGNOSIS — I429 Cardiomyopathy, unspecified: Secondary | ICD-10-CM | POA: Diagnosis present

## 2015-11-22 DIAGNOSIS — I4892 Unspecified atrial flutter: Secondary | ICD-10-CM | POA: Diagnosis present

## 2015-11-22 DIAGNOSIS — I119 Hypertensive heart disease without heart failure: Secondary | ICD-10-CM | POA: Diagnosis present

## 2015-11-22 DIAGNOSIS — I959 Hypotension, unspecified: Secondary | ICD-10-CM

## 2015-11-22 DIAGNOSIS — R42 Dizziness and giddiness: Secondary | ICD-10-CM | POA: Diagnosis not present

## 2015-11-22 DIAGNOSIS — Z79899 Other long term (current) drug therapy: Secondary | ICD-10-CM | POA: Diagnosis not present

## 2015-11-22 DIAGNOSIS — R578 Other shock: Secondary | ICD-10-CM | POA: Diagnosis present

## 2015-11-22 DIAGNOSIS — R579 Shock, unspecified: Secondary | ICD-10-CM | POA: Diagnosis not present

## 2015-11-22 LAB — URINALYSIS COMPLETE WITH MICROSCOPIC (ARMC ONLY)
Bacteria, UA: NONE SEEN
Bilirubin Urine: NEGATIVE
Glucose, UA: NEGATIVE mg/dL
Ketones, ur: NEGATIVE mg/dL
LEUKOCYTES UA: NEGATIVE
Nitrite: NEGATIVE
PH: 5 (ref 5.0–8.0)
PROTEIN: 30 mg/dL — AB
RBC / HPF: NONE SEEN RBC/hpf (ref 0–5)
SQUAMOUS EPITHELIAL / LPF: NONE SEEN
Specific Gravity, Urine: 1.014 (ref 1.005–1.030)
WBC UA: NONE SEEN WBC/hpf (ref 0–5)

## 2015-11-22 LAB — GLUCOSE, CAPILLARY
Glucose-Capillary: 113 mg/dL — ABNORMAL HIGH (ref 65–99)
Glucose-Capillary: 92 mg/dL (ref 65–99)

## 2015-11-22 LAB — BASIC METABOLIC PANEL
ANION GAP: 10 (ref 5–15)
BUN: 29 mg/dL — ABNORMAL HIGH (ref 6–20)
CHLORIDE: 104 mmol/L (ref 101–111)
CO2: 25 mmol/L (ref 22–32)
Calcium: 9 mg/dL (ref 8.9–10.3)
Creatinine, Ser: 1.19 mg/dL (ref 0.61–1.24)
GFR calc non Af Amer: >60 mL/min (ref >60)
Glucose, Bld: 123 mg/dL — ABNORMAL HIGH (ref 65–99)
POTASSIUM: 3.4 mmol/L — AB (ref 3.5–5.1)
Sodium: 139 mmol/L (ref 135–145)

## 2015-11-22 LAB — ECHOCARDIOGRAM COMPLETE
E decel time: 219 msec
EERAT: 6.68
FS: 36 % (ref 28–44)
IVS/LV PW RATIO, ED: 1
LA diam end sys: 41 mm
LA vol A4C: 77.6 ml
LA vol index: 32.6 mL/m2
LADIAMINDEX: 1.85 cm/m2
LASIZE: 41 mm
LAVOL: 72.5 mL
LDCA: 4.15 cm2
LV E/e' medial: 6.68
LV E/e'average: 6.68
LV TDI E'LATERAL: 10.6
LV TDI E'MEDIAL: 11.6
LVELAT: 10.6 cm/s
LVOT VTI: 18.8 cm
LVOT diameter: 23 mm
LVOT peak vel: 110 cm/s
LVOTSV: 78 mL
MV Dec: 219
MV pk E vel: 70.8 m/s
MVPG: 2 mmHg
MVPKAVEL: 68.4 m/s
PW: 12 mm — AB (ref 0.6–1.1)
RV TAPSE: 22 mm

## 2015-11-22 LAB — IRON AND TIBC
Iron: 245 ug/dL — ABNORMAL HIGH (ref 45–182)
Saturation Ratios: 85 % — ABNORMAL HIGH (ref 17.9–39.5)
TIBC: 287 ug/dL (ref 250–450)
UIBC: 42 ug/dL

## 2015-11-22 LAB — CBC
HEMATOCRIT: 15.3 % — AB (ref 40.0–52.0)
HEMOGLOBIN: 5.5 g/dL — AB (ref 13.0–18.0)
MCH: 31.7 pg (ref 26.0–34.0)
MCHC: 35.7 g/dL (ref 32.0–36.0)
MCV: 88.7 fL (ref 80.0–100.0)
Platelets: 219 10*3/uL (ref 150–440)
RBC: 1.73 MIL/uL — AB (ref 4.40–5.90)
RDW: 13.6 % (ref 11.5–14.5)
WBC: 12.8 10*3/uL — AB (ref 3.8–10.6)

## 2015-11-22 LAB — VITAMIN B12: VITAMIN B 12: 493 pg/mL (ref 180–914)

## 2015-11-22 LAB — PREPARE RBC (CROSSMATCH)

## 2015-11-22 LAB — LACTATE DEHYDROGENASE: LDH: 538 U/L — AB (ref 98–192)

## 2015-11-22 LAB — FERRITIN: FERRITIN: 588 ng/mL — AB (ref 24–336)

## 2015-11-22 LAB — ABO/RH: ABO/RH(D): O POS

## 2015-11-22 MED ORDER — FUROSEMIDE 10 MG/ML IJ SOLN
40.0000 mg | Freq: Once | INTRAMUSCULAR | Status: AC
  Administered 2015-11-22: 40 mg via INTRAVENOUS
  Filled 2015-11-22: qty 4

## 2015-11-22 MED ORDER — FLUTICASONE PROPIONATE 50 MCG/ACT NA SUSP
2.0000 | Freq: Every day | NASAL | Status: DC
  Administered 2015-11-23 – 2015-11-30 (×8): 2 via NASAL
  Filled 2015-11-22: qty 16

## 2015-11-22 MED ORDER — POTASSIUM CHLORIDE CRYS ER 20 MEQ PO TBCR
40.0000 meq | EXTENDED_RELEASE_TABLET | Freq: Once | ORAL | Status: AC
  Administered 2015-11-22: 40 meq via ORAL
  Filled 2015-11-22: qty 2

## 2015-11-22 MED ORDER — ACETAMINOPHEN 325 MG PO TABS
650.0000 mg | ORAL_TABLET | Freq: Once | ORAL | Status: AC
  Administered 2015-11-22: 650 mg via ORAL

## 2015-11-22 MED ORDER — METOPROLOL TARTRATE 50 MG PO TABS
75.0000 mg | ORAL_TABLET | Freq: Two times a day (BID) | ORAL | Status: DC
  Administered 2015-11-22 – 2015-11-23 (×2): 75 mg via ORAL
  Administered 2015-11-24: 50 mg via ORAL
  Administered 2015-11-25 – 2015-11-30 (×11): 75 mg via ORAL
  Filled 2015-11-22 (×16): qty 1

## 2015-11-22 MED ORDER — PEG 3350-KCL-NA BICARB-NACL 420 G PO SOLR
4000.0000 mL | Freq: Once | ORAL | Status: AC
  Administered 2015-11-23: 4000 mL via ORAL
  Filled 2015-11-22 (×2): qty 4000

## 2015-11-22 MED ORDER — ALBUTEROL SULFATE HFA 108 (90 BASE) MCG/ACT IN AERS: 2.0000 | INHALATION_SPRAY | Freq: Four times a day (QID) | RESPIRATORY_TRACT | Status: DC | PRN

## 2015-11-22 MED ORDER — DIPHENHYDRAMINE HCL 25 MG PO CAPS
25.0000 mg | ORAL_CAPSULE | Freq: Once | ORAL | Status: AC
  Administered 2015-11-22: 25 mg via ORAL

## 2015-11-22 MED ORDER — DILTIAZEM HCL 60 MG PO TABS
30.0000 mg | ORAL_TABLET | Freq: Four times a day (QID) | ORAL | Status: DC
  Administered 2015-11-22 – 2015-11-30 (×27): 30 mg via ORAL
  Filled 2015-11-22 (×13): qty 1
  Filled 2015-11-22: qty 2
  Filled 2015-11-22 (×13): qty 1

## 2015-11-22 MED ORDER — SODIUM CHLORIDE 0.9% FLUSH
3.0000 mL | Freq: Two times a day (BID) | INTRAVENOUS | Status: DC
  Administered 2015-11-22 – 2015-11-30 (×17): 3 mL via INTRAVENOUS

## 2015-11-22 MED ORDER — ALBUTEROL SULFATE (2.5 MG/3ML) 0.083% IN NEBU: 2.5000 mg | INHALATION_SOLUTION | Freq: Four times a day (QID) | RESPIRATORY_TRACT | Status: DC | PRN

## 2015-11-22 MED ORDER — DIPHENHYDRAMINE HCL 25 MG PO CAPS
ORAL_CAPSULE | ORAL | Status: AC
  Filled 2015-11-22: qty 1

## 2015-11-22 MED ORDER — SODIUM CHLORIDE 0.9 % IV SOLN
10.0000 mL/h | Freq: Once | INTRAVENOUS | Status: AC
  Administered 2015-11-22: 10 mL/h via INTRAVENOUS

## 2015-11-22 MED ORDER — ENALAPRIL MALEATE 10 MG PO TABS
20.0000 mg | ORAL_TABLET | Freq: Every day | ORAL | Status: DC
  Administered 2015-11-23: 20 mg via ORAL
  Filled 2015-11-22 (×2): qty 2

## 2015-11-22 MED ORDER — ACETAMINOPHEN 650 MG RE SUPP: 650.0000 mg | Freq: Four times a day (QID) | RECTAL | Status: DC | PRN

## 2015-11-22 MED ORDER — ACETAMINOPHEN 325 MG PO TABS
650.0000 mg | ORAL_TABLET | Freq: Four times a day (QID) | ORAL | Status: DC | PRN
  Administered 2015-11-22: 650 mg via ORAL
  Filled 2015-11-22 (×2): qty 2

## 2015-11-22 NOTE — ED Provider Notes (Signed)
Time Seen: Approximately 1110  I have reviewed the triage notes  Chief Complaint: Dizziness   History of Present Illness: [name] is a 55 y.o. male *who presents with near-syncope generalized fatigue now for the past week especially. He denies any dark tarry stool. He is currently on sotalol to secondary to previous atrial flutter and cardiomyopathy. He denies any chest pain or abdominal pain. He denies any fever or back pain. He denies much in way of significant trauma.   Past Medical History  Diagnosis Date  . Atrial flutter ([place])     a. on Xarelto; b. s/p successful TEE/DCCV on 08/28/2014  . Pulmonary fibrosis ([place])     a. not on home oxygen  . RA (rheumatoid arthritis) ([place])   . HTN (hypertension)   . Tachycardia-induced cardiomyopathy ([place])     a. echo 07/2014: EF 20-25%, cannot exclude atrial flutter,  global HK, possible bicuspid Ao valve, mod reduced RV sys fxn, mild-mod aortic valve sclerosis/calcification, mod TR, mildly elevated RVSP; b. TEE 08/28/2014: EF 30-35%, no intracardic thrombus, mildly dilated LA/RA, mild TR, mild-mod aortic sclerosis w/o stenosis; c. echo 09/2014: EF 55-60%, select images w/ inf HK  . History of nuclear stress test     a. 09/02/2014: no sig ischemia, GI uptake noted, no sig WMA, EF 48%, no EKG chanes concerning for ischemia, low risk scan    . Normal coronary arteries     a. cardiac cath 12/03/2014: no sigificant CAD, right dominant system, LVEF >55%, no MR or AS  . Gangrene of finger [facility])     Patient Active Problem List   Diagnosis Date Noted  . 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 Normal coronary arteries   . Unstable angina - left-sided shoulder/chest pain occurring at rest 12/02/2014  . Chest pain 12/01/2014  . Typical atrial flutter ([place])   . Tachycardia-induced cardiomyopathy ([place])   . Essential hypertension   . RA (rheumatoid arthritis) ([place])   . Pulmonary fibrosis ([place])   . Atrial flutter [facility])     Past Surgical History   Procedure Laterality Date  . Cardiac catheterization N/A 12/03/2014    Procedure: Left Heart Cath and Coronary Angiography;  Surgeon: Minna Merritts, MD;  Location: [place];  Service: Cardiovascular;  Laterality: N/A;  . Peripheral vascular catheterization N/A 11/17/2015    Procedure: Upper Extremity Angiography;  Surgeon: Wellington Hampshire, MD;  Location: [place] [facility];  Service: Cardiovascular;  Laterality: N/A;    Past Surgical History  Procedure Laterality Date  . Cardiac catheterization N/A 12/03/2014    Procedure: Left Heart Cath and Coronary Angiography;  Surgeon: Minna Merritts, MD;  Location: [facility] [facility];  Service: Cardiovascular;  Laterality: N/A;  . Peripheral vascular catheterization N/A 11/17/2015    Procedure: Upper Extremity Angiography;  Surgeon: Wellington Hampshire, MD;  Location: [place] [facility];  Service: Cardiovascular;  Laterality: N/A;    Current Outpatient Rx  Name  Route  Sig  Dispense  Refill  . diltiazem (CARDIZEM) 30 MG tablet   Oral   Take 1 tablet (30 mg total) by mouth 4 (four) times daily. Every 6 hours as needed for heart rate greater than 90 bpm   30 tablet   0   . enalapril (VASOTEC) 20 MG tablet      TAKE 1 TABLET BY MOUTH EVERY DAY   30 tablet   6   . fluticasone (FLONASE) 50 MCG/ACT nasal spray   Each Nare  Place 2 sprays into both nostrils daily.   16 g   0   . hydrochlorothiazide (MICROZIDE) 12.5 MG capsule   Oral   Take 1 capsule (12.5 mg total) by mouth daily.   90 capsule   1   . meloxicam (MOBIC) 15 MG tablet   Oral   Take 1 tablet (15 mg total) by mouth daily.   30 tablet   0   . metoprolol (LOPRESSOR) 50 MG tablet      TAKE 1 AND 1/2 TABLETS BY MOUTH TWICE DAILY   90 tablet   6   . VENTOLIN HFA 108 (90 Base) MCG/ACT inhaler      INHALE 2 PUFFS BY MOUTH 4 TIMES DAILY   18 Inhaler   0   . XARELTO 20 MG TABS tablet      TAKE 1 TABLET (20 MG TOTAL) BY MOUTH EVERY MORNING.   30 tablet    3     Allergies:  Review of patient's allergies indicates no known allergies.  Family History: Family History  Problem Relation Age of Onset  . Leukemia Mother     Social History: Social History  Substance Use Topics  . Smoking status: Former Smoker -- 1.00 packs/day for 20 years    Types: Cigarettes    Quit date: 08/30/2004  . Smokeless tobacco: Never Used  . Alcohol Use: No     Review of Systems:   10 point review of systems was performed and was otherwise negative:  Constitutional: No fever Eyes: No visual disturbances ENT: No sore throat, ear pain Cardiac: No chest pain Respiratory: Mild shortness of breath, wheezing, or stridor Abdomen: No abdominal pain, no vomiting, No diarrhea Endocrine: No weight loss, No night sweats Extremities: No peripheral edema, cyanosis Skin: No rashes, easy bruising Neurologic: No focal weakness, trouble with speech or swollowing Urologic: No dysuria, Hematuria, or urinary frequency   Physical Exam:  ED Triage Vitals  Enc Vitals Group     BP 11/22/15 1023 104/61 mmHg     Pulse Rate 11/22/15 1023 80     Resp 11/22/15 1023 16     Temp 11/22/15 1023 98.1 F (36.7 C)     Temp Source 11/22/15 1023 Oral     SpO2 --      Weight 11/22/15 1023 207 lb (93.895 kg)     Height 11/22/15 1023 5\' 10"  (1.778 m)     Head Cir --      Peak Flow --      Pain Score --      Pain Loc --      Pain Edu? --      Excl. in [place]? --     General: Awake , Alert , and Oriented times 3; GCS 15 Head: Normal cephalic , atraumatic Eyes: Pupils equal , round, reactive to light. Pale mucous membranes. Pale sclera  Nose/Throat: No nasal drainage, patent upper airway without erythema or exudate.  Neck: Supple, Full range of motion, No anterior adenopathy or palpable thyroid masses Lungs: Clear to ascultation without wheezes , rhonchi, or rales Heart: Regular rate, regular rhythm without murmurs , gallops , or rubs Abdomen: Soft, non tender without rebound,  guarding , or rigidity; bowel sounds positive and symmetric in all 4 quadrants. No organomegaly .        Extremities: 2 plus symmetric pulses. No edema, clubbing or cyanosis Neurologic: normal ambulation, Motor symmetric without deficits, sensory intact Skin: warm, dry, no rashes Rectal exam with chaperone present was  guaiac negative with some loose stool brown in appearance normal sphincter tone and no palpable rectal masses  Labs:   All laboratory work was reviewed including any pertinent negatives or positives listed below:  Labs Reviewed  BASIC METABOLIC PANEL - Abnormal; Notable for the following:    Potassium 3.4 (*)    Glucose, Bld 123 (*)    BUN 29 (*)    All other components within normal limits  CBC - Abnormal; Notable for the following:    WBC 12.8 (*)    RBC 1.73 (*)    Hemoglobin 5.5 (*)    HCT 15.3 (*)    All other components within normal limits  URINALYSIS COMPLETEWITH MICROSCOPIC ([facility] ONLY) - Abnormal; Notable for the following:    Color, Urine AMBER (*)    APPearance CLEAR (*)    Hgb urine dipstick 1+ (*)    Protein, ur 30 (*)    All other components within normal limits  GLUCOSE, CAPILLARY - Abnormal; Notable for the following:    Glucose-Capillary 113 (*)    All other components within normal limits  CBG MONITORING, ED  TYPE AND SCREEN  PREPARE RBC (CROSSMATCH)   Patient significantly anemic at 5.5 EKG:  ED ECG REPORT I, Daymon Larsen, the attending physician, personally viewed and interpreted this ECG.  Date: 11/22/2015 EKG Time: 1033 Rate: 91* Rhythm: Normal sinus rhythm with frequent PVCs QRS Axis: Left axis deviation Intervals: Incomplete right bundle branch block ST/T Wave abnormalities: normal Conduction Disturbances: none Narrative Interpretation: unremarkable No acute ischemic changes    Critical Care:  CRITICAL CARE Performed by: Daymon Larsen   Total critical care time: 33 minutes  Critical care time was exclusive of  separately billable procedures and treating other patients.  Critical care was necessary to treat or prevent imminent or life-threatening deterioration.  Critical care was time spent personally by me on the following activities: development of treatment plan with patient and/or surrogate as well as nursing, discussions with consultants, evaluation of patient's response to treatment, examination of patient, obtaining history from patient or surrogate, ordering and performing treatments and interventions, ordering and review of laboratory studies, ordering and review of radiographic studies, pulse oximetry and re-evaluation of patient's condition. Initiation of blood transfusion for symptomatic anemia    ED Course: * Patient be initiated on transfusion at this time. He is currently guaiac negative, not sure of the source of his blood loss at this time. He seems to be hemodynamically stable and the blood loss does not appear to be acute but gradual in onset. Patient denies any chest or abdominal pain. His case will be reviewed with the hospitalist team, further disposition and management depends upon their evaluation    Assessment:  Symptomatic anemia History of atrial flutter with chronic anticoagulation     Plan:  Inpatient            Daymon Larsen, MD 11/22/15 1140

## 2015-11-22 NOTE — H&P (Signed)
[facility] NAME: [name]    MR#:  [id number]  DATE OF BIRTH:  July 17, 1960  DATE OF ADMISSION:  11/22/2015  PRIMARY CARE PHYSICIAN: Otilio Miu, MD   REQUESTING/REFERRING PHYSICIAN: DR Marcelene Butte  CHIEF COMPLAINT:   Chief Complaint  Patient presents with  . Dizziness    HISTORY OF PRESENT ILLNESS:  [name]  is a 55 y.o. male with a known history of Presents with dizziness and falls. The patient also feels very weak and has a poor appetite and lost 8 pounds in the last week. He's been having some diarrhea that's been yellow as been going on and off. He complains of some shortness of breath. His urine is also dark. In the ER he was found to have a severely low hemoglobin and was guaiac negative. Hospitalist services contacted for further evaluation.   PAST MEDICAL HISTORY:   Past Medical History  Diagnosis Date  . Atrial flutter ([place])     a. on Xarelto; b. s/p successful TEE/DCCV on 08/28/2014  . Pulmonary fibrosis ([place])     a. not on home oxygen  . RA (rheumatoid arthritis) ([place])   . HTN (hypertension)   . 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 echo 07/2014: EF 20-25%, cannot exclude atrial flutter,  global HK, possible bicuspid Ao valve, mod reduced RV sys fxn, mild-mod aortic valve sclerosis/calcification, mod TR, mildly elevated RVSP; b. TEE 08/28/2014: EF 30-35%, no intracardic thrombus, mildly dilated LA/RA, mild TR, mild-mod aortic sclerosis w/o stenosis; c. echo 09/2014: EF 55-60%, select images w/ inf HK  . History of nuclear stress test     a. 09/02/2014: no sig ischemia, GI uptake noted, no sig WMA, EF 48%, no EKG chanes concerning for ischemia, low risk scan    . Normal coronary arteries     a. cardiac cath 12/03/2014: no sigificant CAD, right dominant system, LVEF >55%, no MR or AS  . Gangrene of finger ([place])     PAST SURGICAL HISTORY:   Past Surgical History  Procedure Laterality Date   . Cardiac catheterization N/A 12/03/2014    Procedure: Left Heart Cath and Coronary Angiography;  Surgeon: Minna Merritts, MD;  Location: [facility];  Service: Cardiovascular;  Laterality: N/A;  . Peripheral vascular catheterization N/A 11/17/2015    Procedure: Upper Extremity Angiography;  Surgeon: Wellington Hampshire, MD;  Location: [facility];  Service: Cardiovascular;  Laterality: N/A;    SOCIAL HISTORY:   Social History  Substance Use Topics  . Smoking status: Former Smoker -- 1.00 packs/day for 20 years    Types: Cigarettes    Quit date: 08/30/2004  . Smokeless tobacco: Never Used  . Alcohol Use: No    FAMILY HISTORY:   Family History  Problem Relation Age of Onset  . Leukemia Mother     DRUG ALLERGIES:  No Known Allergies  REVIEW OF SYSTEMS:  CONSTITUTIONAL: No fever. Positive for weakness. Positive for weight loss  EYES: No blurred or double vision. Everything seems bright today. Wears glasses  EARS, NOSE, AND THROAT: No tinnitus or ear pain. No sore throat RESPIRATORY:  positive for cough  and shortness of breath.  no wheezing or hemoptysis.  CARDIOVASCULAR: No chest pain, orthopnea, edema.  GASTROINTESTINAL: No nausea, vomiting, or abdominal pain. No blood in bowel movements. Positive for diarrhea on and off  GENITOURINARY: No dysuria, hematuria.  ENDOCRINE: No polyuria, nocturia,  HEMATOLOGY: No anemia, easy bruising or  bleeding SKIN: No rash or lesion. MUSCULOSKELETAL: No joint pain or arthritis.   NEUROLOGIC: No tingling, numbness, weakness.  PSYCHIATRY: No anxiety or depression.   MEDICATIONS AT HOME:   Prior to Admission medications   Medication Sig Start Date End Date Taking? Authorizing Provider  albuterol (PROVENTIL HFA;VENTOLIN HFA) 108 (90 Base) MCG/ACT inhaler Inhale 2 puffs into the lungs 4 (four) times daily as needed for wheezing or shortness of breath.   Yes Historical Provider, MD  diltiazem (CARDIZEM) 30 MG tablet Take 1 tablet  (30 mg total) by mouth 4 (four) times daily. Every 6 hours as needed for heart rate greater than 90 bpm 12/11/14  Yes Ryan M Dunn, PA-C  enalapril (VASOTEC) 20 MG tablet Take 20 mg by mouth daily.   Yes Historical Provider, MD  fluticasone (FLONASE) 50 MCG/ACT nasal spray Place 2 sprays into both nostrils daily. 06/10/15  Yes Lorin Picket, PA-C  hydrochlorothiazide (MICROZIDE) 12.5 MG capsule Take 1 capsule (12.5 mg total) by mouth daily. 12/11/14  Yes Ryan M Dunn, PA-C  meloxicam (MOBIC) 15 MG tablet Take 1 tablet (15 mg total) by mouth daily. 06/18/15  Yes Titorya Stover, DPM  metoprolol (LOPRESSOR) 50 MG tablet Take 75 mg by mouth 2 (two) times daily.   Yes Historical Provider, MD  rivaroxaban (XARELTO) 20 MG TABS tablet Take 20 mg by mouth every morning.   Yes Historical Provider, MD      VITAL SIGNS:  Blood pressure 104/61, pulse 80, temperature 98.1 F (36.7 C), temperature source Oral, resp. rate 16, height 5\' 10"  (1.778 m), weight 93.895 kg (207 lb).  PHYSICAL EXAMINATION:  GENERAL:  55 y.o.-year-old patient lying in the bed with no acute distress.  EYES: Pupils equal, round, reactive to light and accommodation. No scleral icterus. Extraocular muscles intact.  HEENT: Head atraumatic, normocephalic. Oropharynx and nasopharynx clear.  NECK:  Supple, no jugular venous distention. No thyroid enlargement, no tenderness.  LUNGS: Normal breath sounds bilaterally, no wheezing, rales,rhonchi or crepitation. No use of accessory muscles of respiration.  CARDIOVASCULAR: S1, S2 normal. No murmurs, rubs, or gallops.  ABDOMEN: Soft, nontender, nondistended. Bowel sounds present. No organomegaly or mass.  EXTREMITIES: No pedal edema, cyanosis, or clubbing.  NEUROLOGIC: Cranial nerves II through XII are intact. Muscle strength 5/5 in all extremities. Sensation intact. Gait not checked.  PSYCHIATRIC: The patient is alert and oriented x 3.  SKIN: No rash, lesion, or ulcer.   LABORATORY PANEL:    CBC  Recent Labs Lab 11/22/15 1042  WBC 12.8*  HGB 5.5*  HCT 15.3*  PLT 219   ------------------------------------------------------------------------------------------------------------------  Chemistries   Recent Labs Lab 11/22/15 1042  NA 139  K 3.4*  CL 104  CO2 25  GLUCOSE 123*  BUN 29*  CREATININE 1.19  CALCIUM 9.0   ------------------------------------------------------------------------------------------------------------------   EKG:   Sinus rhythm PVCs, LAD, pulmonary disease pattern, incomplete right bundle branch block, LVH  IMPRESSION AND PLAN:   1. Severe symptomatic anemia with falls. Acute drop in hemoglobin from 14 down to 5.5. Add on iron studies to previous labs. ER physician ordered 2 units of packed red blood cells. Stop Mobic and Xarelto. GI consultation. Also added on an LDH and haptoglobin. Clear liquid diet for now 2. History of atrial flutter. Currently in normal sinus rhythm. Stop Xarelto. Case discussed with Dr. Rockey Situ cardiology. On low-dose Cardizem 3. Pulmonary fibrosis. Continue inhalers 4. Rheumatoid arthritis 5. Hypokalemia replace potassium and stop Microzide   All the records are reviewed and case  discussed with ED provider. Management plans discussed with the patient, family and they are in agreement.  CODE STATUS: Full code  TOTAL TIME TAKING CARE OF THIS PATIENT: 50  minutes. Patient will be admitted to the stepdown unit.   Loletha Grayer M.D on 11/22/2015 at 12:37 PM  Between 7am to 6pm - Pager - [phone]  After 6pm call admission pager [phone]  [facility] Office  [phone]  CC: Primary care physician; Otilio Miu, MD

## 2015-11-22 NOTE — Telephone Encounter (Signed)
Patient is in the clinic for echo.  [place] this morning outside.  Pt  c/o dizziness no appetite sob and erratic hr .  Patient wants to be seen today.  Nothing available in [facility].  Will attempt to schedule in [facility] flex.

## 2015-11-22 NOTE — Telephone Encounter (Signed)
Patient does not want to go to [facility] .  Patient will go to ER at [facility] .

## 2015-11-22 NOTE — Consult Note (Signed)
Devin Lame, MD [address] [place], [place] [address] Phone: [phone] Fax : [phone]  Consultation  Referring Provider:     No ref. provider found Primary Care Physician:  Devin Miu, MD Primary Gastroenterologist:  Dr. Allen Griffin         Reason for Consultation:     Anemia  Date of Admission:  11/22/2015 Date of Consultation:  11/22/2015         HPI:   [name] is a 55 y.o. male who was set up for colonoscopy in March 2016 but was canceled due to a rapid heart rate. The patient was seen by cardiology and started on Xarelto. The patient now reports that he has been feeling weak and tired with dizzy spells and falls. The patient was also having a poor appetite with an 8 pound weight loss in last week. The patient denies any black stools or bloody stools but was found to have profound anemia. The patient had a hemoglobin of 14 and has gone down to 5.5. The patient denies any abdominal pain or family history of colon cancer colon polyps. The patient's hemoglobin of 14.8 was 2 weeks ago. The patient was found to have heme-negative stools in the ER. I'm now being consult at for this patient's anemia with heme-negative stools and a normal MCV with a normal hemoglobin 2 weeks ago.  Past Medical History  Diagnosis Date  . Atrial flutter ([place])     a. on Xarelto; b. s/p successful TEE/DCCV on 08/28/2014  . Pulmonary fibrosis ([place])     a. not on home oxygen  . RA (rheumatoid arthritis) ([place])   . HTN (hypertension)   . 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 echo 07/2014: EF 20-25%, cannot exclude atrial flutter,  global HK, possible bicuspid Ao valve, mod reduced RV sys fxn, mild-mod aortic valve sclerosis/calcification, mod TR, mildly elevated RVSP; b. TEE 08/28/2014: EF 30-35%, no intracardic thrombus, mildly dilated LA/RA, mild TR, mild-mod aortic sclerosis w/o stenosis; c. echo 09/2014: EF 55-60%, select images w/ inf HK  . History of nuclear stress test     a. 09/02/2014: no sig ischemia, GI uptake noted, no sig WMA, EF 48%, no EKG chanes concerning for ischemia, low risk scan    . Normal coronary arteries     a. cardiac cath 12/03/2014: no sigificant CAD, right dominant system, LVEF >55%, no MR or AS  . Gangrene of finger [facility])     Past Surgical History  Procedure Laterality Date  . Cardiac catheterization N/A 12/03/2014    Procedure: Left Heart Cath and Coronary Angiography;  Surgeon: Devin Merritts, MD;  Location: [facility];  Service: Cardiovascular;  Laterality: N/A;  . Peripheral vascular catheterization N/A 11/17/2015    Procedure: Upper Extremity Angiography;  Surgeon: Devin Hampshire, MD;  Location: [facility];  Service: Cardiovascular;  Laterality: N/A;    Prior to Admission medications   Medication Sig Start Date End Date Taking? Authorizing Provider  albuterol (PROVENTIL HFA;VENTOLIN HFA) 108 (90 Base) MCG/ACT inhaler Inhale 2 puffs into the lungs 4 (four) times daily as needed for wheezing or shortness of breath.   Yes Historical Provider, MD  diltiazem (CARDIZEM) 30 MG tablet Take 1 tablet (30 mg total) by mouth 4 (four) times daily. Every 6 hours as needed for heart rate greater than 90 bpm 12/11/14  Yes Devin M Dunn, PA-C  enalapril (VASOTEC) 20 MG tablet Take 20 mg by mouth daily.  Yes Historical Provider, MD  fluticasone (FLONASE) 50 MCG/ACT nasal spray Place 2 sprays into both nostrils daily. 06/10/15  Yes Devin Picket, PA-C  hydrochlorothiazide (MICROZIDE) 12.5 MG capsule Take 1 capsule (12.5 mg total) by mouth daily. 12/11/14  Yes Devin M Dunn, PA-C  meloxicam (MOBIC) 15 MG tablet Take 1 tablet (15 mg total) by mouth daily. 06/18/15  Yes [name], DPM  metoprolol (LOPRESSOR) 50 MG tablet Take 75 mg by mouth 2 (two) times daily.   Yes Historical Provider, MD  rivaroxaban (XARELTO) 20 MG TABS tablet Take 20 mg by mouth every morning.   Yes Historical Provider, MD    Family History  Problem Relation Age  of Onset  . Leukemia Mother      Social History  Substance Use Topics  . Smoking status: Former Smoker -- 1.00 packs/day for 20 years    Types: Cigarettes    Quit date: 08/30/2004  . Smokeless tobacco: Never Used  . Alcohol Use: No    Allergies as of 11/22/2015  . (No Known Allergies)    Review of Systems:    All systems reviewed and negative except where noted in HPI.   Physical Exam:  Vital signs in last 24 hours: Temp:  [98 F (36.7 C)-98.5 F (36.9 C)] 98.3 F (36.8 C) (06/26 1530) Pulse Rate:  [51-97] 97 (06/26 1530) Resp:  [16-26] 23 (06/26 1530) BP: (104-124)/(59-81) 121/79 mmHg (06/26 1530) SpO2:  [91 %-100 %] 91 % (06/26 1430) Weight:  [207 lb (93.895 kg)] 207 lb (93.895 kg) (06/26 1023)   General:   Pleasant, cooperative in NAD Head:  Normocephalic and atraumatic. Eyes:   No icterus.   Conjunctiva pink. PERRLA. Ears:  Normal auditory acuity. Neck:  Supple; no masses or thyroidomegaly Lungs: Respirations even and unlabored. Lungs clear to auscultation bilaterally.   No wheezes, crackles, or rhonchi.  Heart:  Regular rate and rhythm;  Without murmur, clicks, rubs or gallops Abdomen:  Soft, nondistended, nontender. Normal bowel sounds. No appreciable masses or hepatomegaly.  No rebound or guarding.  Rectal:  Not performed. Msk:  Symmetrical without gross deformities.    Extremities:  Without edema, cyanosis or clubbing. Neurologic:  Alert and oriented x3;  grossly normal neurologically except for right eye deviation. Skin:  Intact without significant lesions or rashes. Cervical Nodes:  No significant cervical adenopathy. Psych:  Alert and cooperative. Normal affect.  LAB RESULTS:  Recent Labs  11/22/15 1042  WBC 12.8*  HGB 5.5*  HCT 15.3*  PLT 219   BMET  Recent Labs  11/22/15 1042  NA 139  K 3.4*  CL 104  CO2 25  GLUCOSE 123*  BUN 29*  CREATININE 1.19  CALCIUM 9.0   LFT No results for input(s): PROT, ALBUMIN, AST, ALT, ALKPHOS, BILITOT,  BILIDIR, IBILI in the last 72 hours. PT/INR No results for input(s): LABPROT, INR in the last 72 hours.  STUDIES: No results found.    Impression / Plan:   [name] is a 55 y.o. y/o male with Admission to the ICU for profound anemia. The patient had heme-negative stools but has never had a colonoscopy or EGD. The patient has been on blood thinners which is worrisome that he should've such profound anemia. The patient will be set up for an EGD and colonoscopy. The patient has haptoglobin and LDH pending at the present time. The patient and his wife have been explained the plan and agree with it.   Thank you for involving me in  the care of this patient.      LOS: 0 days   Devin Lame, MD  11/22/2015, 4:08 PM   Note: This dictation was prepared with Dragon dictation along with smaller phrase technology. Any transcriptional errors that result from this process are unintentional.

## 2015-11-22 NOTE — ED Notes (Addendum)
Pt presents to ED with reports of dizziness and lightheadedness that began this weekend. Pt denies nausea and vomiting. Pt denies pain. Pt states he can feel his pulse pounded through his head. Pt reports falling this morning. Denies head injury, denies LOC.

## 2015-11-23 ENCOUNTER — Inpatient Hospital Stay: Payer: 59 | Admitting: Registered Nurse

## 2015-11-23 ENCOUNTER — Encounter
Admission: EM | Disposition: A | Discharge status: Home / Self Care | Payer: Self-pay | Source: Home / Self Care | Attending: Internal Medicine

## 2015-11-23 DIAGNOSIS — K209 Esophagitis, unspecified without bleeding: Secondary | ICD-10-CM | POA: Insufficient documentation

## 2015-11-23 DIAGNOSIS — D509 Iron deficiency anemia, unspecified: Secondary | ICD-10-CM | POA: Insufficient documentation

## 2015-11-23 DIAGNOSIS — K449 Diaphragmatic hernia without obstruction or gangrene: Secondary | ICD-10-CM | POA: Insufficient documentation

## 2015-11-23 HISTORY — PX: COLONOSCOPY WITH PROPOFOL: SHX5780

## 2015-11-23 HISTORY — PX: ESOPHAGOGASTRODUODENOSCOPY (EGD) WITH PROPOFOL: SHX5813

## 2015-11-23 LAB — CBC
HCT: 19.4 % — ABNORMAL LOW (ref 40.0–52.0)
HEMOGLOBIN: 7.2 g/dL — AB (ref 13.0–18.0)
MCH: 32.4 pg (ref 26.0–34.0)
MCHC: 37.1 g/dL — AB (ref 32.0–36.0)
MCV: 87.2 fL (ref 80.0–100.0)
Platelets: 201 10*3/uL (ref 150–440)
RBC: 2.23 MIL/uL — ABNORMAL LOW (ref 4.40–5.90)
RDW: 14.1 % (ref 11.5–14.5)
WBC: 9.7 10*3/uL (ref 3.8–10.6)

## 2015-11-23 LAB — BASIC METABOLIC PANEL
ANION GAP: 11 (ref 5–15)
BUN: 32 mg/dL — ABNORMAL HIGH (ref 6–20)
CALCIUM: 8.8 mg/dL — AB (ref 8.9–10.3)
CO2: 24 mmol/L (ref 22–32)
Chloride: 104 mmol/L (ref 101–111)
Creatinine, Ser: 1.15 mg/dL (ref 0.61–1.24)
GLUCOSE: 93 mg/dL (ref 65–99)
Potassium: 3.8 mmol/L (ref 3.5–5.1)
SODIUM: 139 mmol/L (ref 135–145)

## 2015-11-23 LAB — CBC WITH DIFFERENTIAL/PLATELET
Basophils Absolute: 0 10*3/uL (ref 0–0.1)
Basophils Relative: 1 %
Eosinophils Absolute: 0.2 10*3/uL (ref 0–0.7)
Eosinophils Relative: 2 %
HEMATOCRIT: 17 % — AB (ref 40.0–52.0)
HEMOGLOBIN: 6.1 g/dL — AB (ref 13.0–18.0)
Lymphocytes Relative: 12 %
Lymphs Abs: 1.1 10*3/uL (ref 1.0–3.6)
MCH: 33.5 pg (ref 26.0–34.0)
MCHC: 36.1 g/dL — ABNORMAL HIGH (ref 32.0–36.0)
MCV: 92.9 fL (ref 80.0–100.0)
MONOS PCT: 7 %
Monocytes Absolute: 0.6 10*3/uL (ref 0.2–1.0)
NEUTROS ABS: 6.8 10*3/uL — AB (ref 1.4–6.5)
Neutrophils Relative %: 78 %
Platelets: 197 10*3/uL (ref 150–440)
RBC: 1.83 MIL/uL — AB (ref 4.40–5.90)
RDW: 14.1 % (ref 11.5–14.5)
WBC: 8.6 10*3/uL (ref 3.8–10.6)

## 2015-11-23 LAB — GLUCOSE, CAPILLARY: GLUCOSE-CAPILLARY: 64 mg/dL — AB (ref 65–99)

## 2015-11-23 LAB — HAPTOGLOBIN

## 2015-11-23 SURGERY — ESOPHAGOGASTRODUODENOSCOPY (EGD) WITH PROPOFOL
Anesthesia: General

## 2015-11-23 MED ORDER — SODIUM CHLORIDE 0.9 % IV SOLN
INTRAVENOUS | Status: DC | PRN
Start: 2015-11-23 — End: 2015-11-23
  Administered 2015-11-23: 17:00:00 via INTRAVENOUS

## 2015-11-23 MED ORDER — PROPOFOL 10 MG/ML IV BOLUS
INTRAVENOUS | Status: DC | PRN
  Administered 2015-11-23: 60 mg via INTRAVENOUS

## 2015-11-23 MED ORDER — PROPOFOL 500 MG/50ML IV EMUL
INTRAVENOUS | Status: DC | PRN
  Administered 2015-11-23: 180 ug/kg/min via INTRAVENOUS

## 2015-11-23 NOTE — Op Note (Signed)
[facility] Gastroenterology Patient Name: [name] Procedure Date: 11/23/2015 4:45 PM MRN: [id number] Account #: [id number] Date of Birth: 15-Mar-1961 Admit Type: Inpatient Age: 55 Room: [facility] ENDO ROOM 1 Gender: Male Note Status: Finalized Procedure:            Colonoscopy Indications:          Iron deficiency anemia Providers:            [name], MD Referring MD:         Juline Patch, MD (Referring MD) Medicines:            Propofol per Anesthesia Complications:        No immediate complications. Procedure:            Pre-Anesthesia Assessment:                       - Prior to the procedure, a History and Physical was                        performed, and patient medications and allergies were                        reviewed. The patient's tolerance of previous                        anesthesia was also reviewed. The risks and benefits of                        the procedure and the sedation options and risks were                        discussed with the patient. All questions were                        answered, and informed consent was obtained. Prior                        Anticoagulants: The patient has taken no previous                        anticoagulant or antiplatelet agents. ASA Grade                        Assessment: II - A patient with mild systemic disease.                        After reviewing the risks and benefits, the patient was                        deemed in satisfactory condition to undergo the                        procedure.                       After obtaining informed consent, the colonoscope was                        passed under direct vision. Throughout the procedure,  the patient's blood pressure, pulse, and oxygen                        saturations were monitored continuously. The                        Colonoscope was introduced through the anus and                        advanced to the  the cecum, identified by appendiceal                        orifice and ileocecal valve. The colonoscopy was                        performed without difficulty. The patient tolerated the                        procedure well. The quality of the bowel preparation                        was excellent. Findings:      The perianal and digital rectal examinations were normal.      Multiple small-mouthed diverticula were found in the entire colon.      Non-bleeding internal hemorrhoids were found during retroflexion. The       hemorrhoids were Grade II (internal hemorrhoids that prolapse but reduce       spontaneously). Impression:           - Diverticulosis in the entire examined colon.                       - Non-bleeding internal hemorrhoids.                       - No specimens collected. Recommendation:       - No source of any GI bleeding seen.                       No fresh or old blood seen. Procedure Code(s):    --- Professional ---                       [id number], Colonoscopy, flexible; diagnostic, including                        collection of specimen(s) by brushing or washing, when                        performed (separate procedure) Diagnosis Code(s):    --- Professional ---                       D50.9, Iron deficiency anemia, unspecified CPT copyright 2016 American Medical Association. All rights reserved. The codes documented in this report are preliminary and upon coder review may  be revised to meet current compliance requirements. [name], MD 11/23/2015 5:19:06 PM This report has been signed electronically. Number of Addenda: 0 Note Initiated On: 11/23/2015 4:45 PM Scope Withdrawal Time: 0 hours 8 minutes 38 seconds  Total Procedure Duration: 0 hours 11 minutes 37 seconds       [facility]

## 2015-11-23 NOTE — Progress Notes (Signed)
[name] at [place] NAME: [name]    MR#:  [id number]  DATE OF BIRTH:  1961-03-22  SUBJECTIVE:  CHIEF COMPLAINT:   Chief Complaint  Patient presents with  . Dizziness    Came with dizziness and found to have severe anemia. Also had a drop in hemoglobin. Weight loss.    blood transfusion overnight. Hemoglobin stable now.   Schedule for endoscopy and colonoscopy today.   LDH is high and haptoglobin is low.  REVIEW OF SYSTEMS:  CONSTITUTIONAL: No fever,positive for fatigue or weakness.  EYES: No blurred or double vision.  EARS, NOSE, AND THROAT: No tinnitus or ear pain.  RESPIRATORY: No cough, shortness of breath, wheezing or hemoptysis.  CARDIOVASCULAR: No chest pain, orthopnea, edema.  GASTROINTESTINAL: No nausea, vomiting, diarrhea or abdominal pain.  GENITOURINARY: No dysuria, hematuria.  ENDOCRINE: No polyuria, nocturia,  HEMATOLOGY: No anemia, easy bruising or bleeding SKIN: No rash or lesion. MUSCULOSKELETAL: No joint pain or arthritis.   NEUROLOGIC: No tingling, numbness, weakness.  PSYCHIATRY: No anxiety or depression.   ROS  DRUG ALLERGIES:  No Known Allergies  VITALS:  Blood pressure 114/77, pulse 91, temperature 98.1 F (36.7 C), temperature source Oral, resp. rate 20, height 5\' 10"  (1.778 m), weight 93.895 kg (207 lb), SpO2 100 %.  PHYSICAL EXAMINATION:  GENERAL:  55 y.o.-year-old patient lying in the bed with no acute distress.  EYES: Pupils equal, round, reactive to light and accommodation. No scleral icterus. Extraocular muscles intact. Conjunctiva pale. HEENT: Head atraumatic, normocephalic. Oropharynx and nasopharynx clear.  NECK:  Supple, no jugular venous distention. No thyroid enlargement, no tenderness.  LUNGS: Normal breath sounds bilaterally, no wheezing, rales,rhonchi or crepitation. No use of accessory muscles of respiration.  CARDIOVASCULAR: S1, S2 normal. No murmurs, rubs, or gallops.   ABDOMEN: Soft, nontender, nondistended. Bowel sounds present. No organomegaly or mass.  EXTREMITIES: No pedal edema, cyanosis, or clubbing.  NEUROLOGIC: Cranial nerves II through XII are intact. Muscle strength 5/5 in all extremities. Sensation intact. Gait not checked.  PSYCHIATRIC: The patient is alert and oriented x 3.  SKIN: No obvious rash, lesion, or ulcer.   Physical Exam LABORATORY PANEL:   CBC  Recent Labs Lab 11/23/15 0355  WBC 9.7  HGB 7.2*  HCT 19.4*  PLT 201   ------------------------------------------------------------------------------------------------------------------  Chemistries   Recent Labs Lab 11/23/15 0355  NA 139  K 3.8  CL 104  CO2 24  GLUCOSE 93  BUN 32*  CREATININE 1.15  CALCIUM 8.8*   ------------------------------------------------------------------------------------------------------------------  Cardiac Enzymes No results for input(s): TROPONINI in the last 168 hours. ------------------------------------------------------------------------------------------------------------------  RADIOLOGY:  No results found.  ASSESSMENT AND PLAN:   Active Problems:   Anemia   1. Severe symptomatic anemia with falls. Acute drop in hemoglobin from 14 down to 5.5.   Received 2 units of transfusion by ER. Hemoglobin is stable now.   Iron studies shows no wine deficiency. MCV is normal.   LDH is high and haptoglobin is low suggestive of hemolysis.   As patient never had GI workup GI is going to do endoscopy and colonoscopy today.   I also called hematology consult for possible hemodialysis.   Hold anti-coagulation for now. 2. History of atrial flutter. Currently in normal sinus rhythm. Stop Xarelto. Case discussed with Dr. Rockey Situ cardiology. On low-dose Cardizem 3. Pulmonary fibrosis. Continue inhalers 4. Rheumatoid arthritis 5. Hypokalemia replace potassium and stop Microzide  All the records are reviewed and case discussed with Care  Management/Social Workerr. Management plans discussed with the patient, family and they are in agreement.  CODE STATUS: full/  TOTAL TIME TAKING CARE OF THIS PATIENT: 35 minutes.     POSSIBLE D/C IN 1-2 DAYS, DEPENDING ON CLINICAL CONDITION.   Vaughan Basta M.D on 11/23/2015   Between 7am to 6pm - Pager - [phone]  After 6pm go to [email or web address] - password [email or web address] [facility]  Office  [phone]  CC: Primary care physician; Otilio Miu, MD  Note: This dictation was prepared with Dragon dictation along with smaller phrase technology. Any transcriptional errors that result from this process are unintentional.

## 2015-11-23 NOTE — Progress Notes (Signed)
Pt stated he was unable to drink to golytely solution as fast as he needed to. I informed pt that I could place an NG tube and introduce the medication through that route. Pt agreed to same. Dr. Allen Norris contacted and gave verbal orders to place the tube. Once tube was being introduced into pt's nare, he immediately refused any further advancement of same. Attempted to coach pt to swallow and relax without success. Pt stated he would continue to attempt to drink as much of the medication as he could.

## 2015-11-23 NOTE — Transfer of Care (Signed)
Immediate Anesthesia Transfer of Care Note  Patient: [name]  Procedure(s) Performed: Procedure(s): ESOPHAGOGASTRODUODENOSCOPY (EGD) WITH PROPOFOL (N/A) COLONOSCOPY WITH PROPOFOL (N/A)  Patient Location: PACU  Anesthesia Type:General  Level of Consciousness: awake  Airway & Oxygen Therapy: Patient connected to nasal cannula oxygen  Post-op Assessment: Post -op Vital signs reviewed and stable  Post vital signs: stable  Last Vitals:  Filed Vitals:   11/23/15 1600 11/23/15 1725  BP: 114/77 85/45  Pulse: 91 98  Temp: 36.7 C 36.7 C  Resp: 20 16    Last Pain: There were no vitals filed for this visit.       Complications: No apparent anesthesia complications

## 2015-11-23 NOTE — Anesthesia Postprocedure Evaluation (Signed)
Anesthesia Post Note  Patient: [name]  Procedure(s) Performed: Procedure(s) (LRB): ESOPHAGOGASTRODUODENOSCOPY (EGD) WITH PROPOFOL (N/A) COLONOSCOPY WITH PROPOFOL (N/A)  Patient location during evaluation: Endoscopy Anesthesia Type: General Level of consciousness: awake and alert Pain management: pain level controlled Vital Signs Assessment: post-procedure vital signs reviewed and stable Respiratory status: spontaneous breathing, nonlabored ventilation, respiratory function stable and patient connected to nasal cannula oxygen Cardiovascular status: blood pressure returned to baseline and stable Postop Assessment: no signs of nausea or vomiting Anesthetic complications: no    Last Vitals:  Filed Vitals:   11/23/15 1755 11/23/15 1800  BP: 115/60   Pulse: 98   Temp:    Resp: 16 18    Last Pain: There were no vitals filed for this visit.               [name]

## 2015-11-23 NOTE — Op Note (Signed)
[facility] Gastroenterology Patient Name: [name] Procedure Date: 11/23/2015 4:45 PM MRN: [id number] Account #: [id number] Date of Birth: September 04, 1960 Admit Type: Inpatient Age: 55 Room: [facility] ENDO ROOM 1 Gender: Male Note Status: Finalized Procedure:            Upper GI endoscopy Indications:          Iron deficiency anemia Providers:            [name], MD Referring MD:         Juline Patch, MD (Referring MD) Medicines:            Propofol per Anesthesia Complications:        No immediate complications. Procedure:            Pre-Anesthesia Assessment:                       - Prior to the procedure, a History and Physical was                        performed, and patient medications and allergies were                        reviewed. The patient's tolerance of previous                        anesthesia was also reviewed. The risks and benefits of                        the procedure and the sedation options and risks were                        discussed with the patient. All questions were                        answered, and informed consent was obtained. Prior                        Anticoagulants: The patient has taken no previous                        anticoagulant or antiplatelet agents. ASA Grade                        Assessment: II - A patient with mild systemic disease.                        After reviewing the risks and benefits, the patient was                        deemed in satisfactory condition to undergo the                        procedure.                       After obtaining informed consent, the endoscope was                        passed under direct vision. Throughout the procedure,  the patient's blood pressure, pulse, and oxygen                        saturations were monitored continuously. The Endoscope                        was introduced through the mouth, and advanced to the        second part of duodenum. The upper GI endoscopy was                        accomplished without difficulty. The patient tolerated                        the procedure well. Findings:      A medium-sized hiatal hernia was present.      LA Grade A (one or more mucosal breaks less than 5 mm, not extending       between tops of 2 mucosal folds) esophagitis with no bleeding was found       at the gastroesophageal junction.      The stomach was normal.      The examined duodenum was normal. Impression:           - Medium-sized hiatal hernia.                       - LA Grade A esophagitis.                       - Normal stomach.                       - Normal examined duodenum.                       - No specimens collected. Recommendation:       - Perform a colonoscopy today. Procedure Code(s):    --- Professional ---                       [phone], Esophagogastroduodenoscopy, flexible, transoral;                        diagnostic, including collection of specimen(s) by                        brushing or washing, when performed (separate procedure) Diagnosis Code(s):    --- Professional ---                       D50.9, Iron deficiency anemia, unspecified                       K44.9, Diaphragmatic hernia without obstruction or                        gangrene                       K20.9, Esophagitis, unspecified CPT copyright 2016 [employer]. All rights reserved. The codes documented in this report are preliminary and upon coder review may  be revised to meet current compliance requirements. [name], MD 11/23/2015 5:03:56 PM This report has been signed electronically. Number of Addenda: 0 Note  Initiated On: 11/23/2015 4:45 PM      [facility]

## 2015-11-23 NOTE — Consult Note (Signed)
Patient was not available for examination upon consultation request. Currently an endoscopy undergoing EGD and colonoscopy with Dr. Allen Norris. Have reviewed his chart as well as lab findings and discussed with Dr. Grayland Ormond. Patient with an LDH of 538 and a haptoglobin of less than 10, significant findings for hemolysis. Will go ahead and order a Poly specific DAT and Flow cytometry. Patient appears to have responded to transfusion of 2 units of packed red blood cells. Will evaluate tomorrow.

## 2015-11-23 NOTE — Anesthesia Preprocedure Evaluation (Signed)
Anesthesia Evaluation  Patient identified by MRN, date of birth, ID band Patient awake    Reviewed: Allergy & Precautions, H&P , NPO status , Patient's Chart, lab work & pertinent test results  Airway Mallampati: III  TM Distance: >3 FB Neck ROM: limited    Dental  (+) Poor Dentition   Pulmonary shortness of breath and with exertion, sleep apnea , former smoker,    Pulmonary exam normal breath sounds clear to auscultation       Cardiovascular hypertension, (-) angina+ DOE  (-) Past MI Normal cardiovascular exam Rhythm:regular Rate:Normal     Neuro/Psych negative neurological ROS  negative psych ROS   GI/Hepatic negative GI ROS, Neg liver ROS, neg GERD  ,  Endo/Other  negative endocrine ROS  Renal/GU negative Renal ROS  negative genitourinary   Musculoskeletal  (+) Arthritis ,   Abdominal   Peds  Hematology negative hematology ROS (+)   Anesthesia Other Findings Past Medical History:   Atrial flutter (HCC)                                           Comment:a. on Xarelto; b. s/p successful TEE/DCCV on               08/28/2014   Pulmonary fibrosis ([place])                                       Comment:a. 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 echo 07/2014: EF 20-25%, cannot exclude               atrial flutter,  global HK, possible bicuspid               Ao valve, mod reduced RV sys fxn, mild-mod               aortic valve sclerosis/calcification, mod TR,               mildly elevated RVSP; b. TEE 08/28/2014: EF               30-35%, no intracardic thrombus, mildly dilated              LA/RA, mild TR, mild-mod aortic sclerosis w/o               stenosis; c. echo 09/2014: EF 55-60%, select               images w/ inf HK   History of nuclear stress  test                                 Comment:a. 09/02/2014: no sig  ischemia, GI uptake noted,               no sig WMA, EF 48%, no EKG chanes concerning               for ischemia, low risk scan     Normal coronary arteries                                       Comment:a. cardiac cath 12/03/2014: no sigificant CAD,               right dominant system, LVEF >55%, no MR or AS   Gangrene of finger ([place])                                    Past Surgical History:   CARDIAC CATHETERIZATION                         N/A 12/03/2014       Comment:Procedure: Left Heart Cath and Coronary               Angiography;  Surgeon: Minna Merritts, MD;                Location: [facility];  Service:               Cardiovascular;  Laterality: N/A;   PERIPHERAL VASCULAR CATHETERIZATION             N/A 11/17/2015      Comment:Procedure: Upper Extremity Angiography;                Surgeon: Wellington Hampshire, MD;  Location: [facility];  Service: Cardiovascular;                Laterality: N/A;  BMI    Body Mass Index   29.70 kg/m 2      Reproductive/Obstetrics negative OB ROS                             Anesthesia Physical Anesthesia Plan  ASA: III  Anesthesia Plan: General   Post-op Pain Management:    Induction:   Airway Management Planned:   Additional Equipment:   Intra-op Plan:   Post-operative Plan:   Informed Consent: I have reviewed the patients History and Physical, chart, labs and discussed the procedure including the risks, benefits and alternatives for the proposed anesthesia with the patient or authorized representative who has indicated his/her understanding and acceptance.   Dental Advisory Given  Plan Discussed with: Anesthesiologist, CRNA and Surgeon  Anesthesia Plan Comments:         Anesthesia Quick Evaluation

## 2015-11-23 NOTE — Progress Notes (Signed)
Pt was able to drink approximately 2200 mL of the Golytely solution. Endoscopy called for report and stated they would take pt to the procedure area around 1500 this date. Pt was placed back on NPO status at 1040 and endoscopy personnel informed of same. Dr. Allen Norris contacted and also given an update on the amount of solution consumed and the time pt was placed back on NPO status.

## 2015-11-23 NOTE — Progress Notes (Signed)
[name] was scheduled to receive Go-lytely 11/22/15 due to pt having a colonoscopy today 11/23/15. For an unknown reason, pt did not receive the scheduled medication. Dr. Allen Norris informed of same and stated to give the medication as soon as it arrived from pharmacy and have the pt drink same over one and a half hours or less. Pt started on the medication as soon as it arrived, will continue to monitor and assist.

## 2015-11-23 NOTE — Plan of Care (Signed)
Problem: Fluid Volume: Goal: Ability to maintain a balanced intake and output will improve Outcome: Not Progressing Pt is NPO at this time

## 2015-11-24 ENCOUNTER — Encounter: Payer: Self-pay | Admitting: Gastroenterology

## 2015-11-24 ENCOUNTER — Inpatient Hospital Stay: Payer: 59

## 2015-11-24 DIAGNOSIS — I429 Cardiomyopathy, unspecified: Secondary | ICD-10-CM

## 2015-11-24 DIAGNOSIS — I10 Essential (primary) hypertension: Secondary | ICD-10-CM

## 2015-11-24 DIAGNOSIS — J8410 Pulmonary fibrosis, unspecified: Secondary | ICD-10-CM

## 2015-11-24 DIAGNOSIS — R42 Dizziness and giddiness: Secondary | ICD-10-CM

## 2015-11-24 DIAGNOSIS — Z79899 Other long term (current) drug therapy: Secondary | ICD-10-CM

## 2015-11-24 DIAGNOSIS — D589 Hereditary hemolytic anemia, unspecified: Secondary | ICD-10-CM | POA: Insufficient documentation

## 2015-11-24 DIAGNOSIS — I4892 Unspecified atrial flutter: Secondary | ICD-10-CM

## 2015-11-24 DIAGNOSIS — M069 Rheumatoid arthritis, unspecified: Secondary | ICD-10-CM

## 2015-11-24 DIAGNOSIS — D591 Other autoimmune hemolytic anemias: Principal | ICD-10-CM

## 2015-11-24 LAB — CBC
HCT: 23.3 % — ABNORMAL LOW (ref 40.0–52.0)
HEMATOCRIT: 20.8 % — AB (ref 40.0–52.0)
Hemoglobin: 7.6 g/dL — ABNORMAL LOW (ref 13.0–18.0)
Hemoglobin: 8.6 g/dL — ABNORMAL LOW (ref 13.0–18.0)
MCH: 34 pg (ref 26.0–34.0)
MCH: 35 pg — ABNORMAL HIGH (ref 26.0–34.0)
MCHC: 36.7 g/dL — AB (ref 32.0–36.0)
MCHC: 37 g/dL — ABNORMAL HIGH (ref 32.0–36.0)
MCV: 92.8 fL (ref 80.0–100.0)
MCV: 94.4 fL (ref 80.0–100.0)
PLATELETS: 176 10*3/uL (ref 150–440)
Platelets: 206 K/uL (ref 150–440)
RBC: 2.24 MIL/uL — ABNORMAL LOW (ref 4.40–5.90)
RBC: 2.47 MIL/uL — ABNORMAL LOW (ref 4.40–5.90)
RDW: 14.5 % (ref 11.5–14.5)
RDW: 14.7 % — ABNORMAL HIGH (ref 11.5–14.5)
WBC: 8 10*3/uL (ref 3.8–10.6)
WBC: 9 K/uL (ref 3.8–10.6)

## 2015-11-24 LAB — COMPREHENSIVE METABOLIC PANEL
ALT: 36 U/L (ref 17–63)
AST: 48 U/L — ABNORMAL HIGH (ref 15–41)
Albumin: 3.5 g/dL (ref 3.5–5.0)
Alkaline Phosphatase: 52 U/L (ref 38–126)
Anion gap: 8 (ref 5–15)
BILIRUBIN TOTAL: 3.9 mg/dL — AB (ref 0.3–1.2)
BUN: 33 mg/dL — AB (ref 6–20)
CALCIUM: 8.7 mg/dL — AB (ref 8.9–10.3)
CHLORIDE: 103 mmol/L (ref 101–111)
CO2: 25 mmol/L (ref 22–32)
CREATININE: 1 mg/dL (ref 0.61–1.24)
Glucose, Bld: 102 mg/dL — ABNORMAL HIGH (ref 65–99)
Potassium: 3.2 mmol/L — ABNORMAL LOW (ref 3.5–5.1)
Sodium: 136 mmol/L (ref 135–145)
TOTAL PROTEIN: 7.6 g/dL (ref 6.5–8.1)

## 2015-11-24 LAB — PREPARE RBC (CROSSMATCH)

## 2015-11-24 LAB — DAT, POLYSPECIFIC AHG (ARMC ONLY)
DAT, IGG: POSITIVE
DAT, complement: POSITIVE
POLYSPECIFIC AHG TEST: POSITIVE

## 2015-11-24 LAB — MAGNESIUM: MAGNESIUM: 1.6 mg/dL — AB (ref 1.7–2.4)

## 2015-11-24 LAB — BILIRUBIN, DIRECT: Bilirubin, Direct: 0.7 mg/dL — ABNORMAL HIGH (ref 0.1–0.5)

## 2015-11-24 MED ORDER — POTASSIUM CHLORIDE CRYS ER 20 MEQ PO TBCR
40.0000 meq | EXTENDED_RELEASE_TABLET | Freq: Once | ORAL | Status: AC
  Administered 2015-11-24: 40 meq via ORAL
  Filled 2015-11-24: qty 2

## 2015-11-24 MED ORDER — POTASSIUM CHLORIDE CRYS ER 20 MEQ PO TBCR
20.0000 meq | EXTENDED_RELEASE_TABLET | Freq: Once | ORAL | Status: AC
  Administered 2015-11-24: 20 meq via ORAL
  Filled 2015-11-24: qty 1

## 2015-11-24 MED ORDER — SODIUM CHLORIDE 0.9 % IV SOLN
Freq: Once | INTRAVENOUS | Status: AC
  Administered 2015-11-24: 02:00:00 via INTRAVENOUS

## 2015-11-24 MED ORDER — POTASSIUM CHLORIDE 10 MEQ/100ML IV SOLN
10.0000 meq | INTRAVENOUS | Status: DC
  Filled 2015-11-24 (×2): qty 100

## 2015-11-24 MED ORDER — MAGNESIUM SULFATE 4 GM/100ML IV SOLN
4.0000 g | Freq: Once | INTRAVENOUS | Status: AC
  Administered 2015-11-24: 4 g via INTRAVENOUS
  Filled 2015-11-24: qty 100

## 2015-11-24 MED ORDER — SODIUM CHLORIDE 0.9 % IV SOLN: INTRAVENOUS | Status: DC

## 2015-11-24 MED ORDER — SODIUM CHLORIDE 0.9 % IV BOLUS (SEPSIS)
500.0000 mL | Freq: Once | INTRAVENOUS | Status: AC
  Administered 2015-11-24: 500 mL via INTRAVENOUS

## 2015-11-24 MED ORDER — PREDNISONE 20 MG PO TABS
90.0000 mg | ORAL_TABLET | Freq: Every day | ORAL | Status: DC
  Administered 2015-11-24 – 2015-11-27 (×4): 90 mg via ORAL
  Filled 2015-11-24: qty 4
  Filled 2015-11-24: qty 1
  Filled 2015-11-24: qty 4
  Filled 2015-11-24: qty 1

## 2015-11-24 NOTE — Progress Notes (Signed)
Spoke with Dr Anselm Jungling regarding potassium level and Hgb result after 1 unit transfused.  Orders for pharmacy replacement of electrolytes. To hold off on giving 2nd unit PRBC for now

## 2015-11-24 NOTE — Progress Notes (Signed)
Initial Nutrition Assessment  DOCUMENTATION CODES:   Not applicable  INTERVENTION:  -Monitor intake and diet progression   NUTRITION DIAGNOSIS:   Inadequate oral intake related to acute illness (fatigue) as evidenced by per patient/family report, percent weight loss.    GOAL:   Patient will meet greater than or equal to 90% of their needs    MONITOR:   PO intake, Diet advancement  REASON FOR ASSESSMENT:   Malnutrition Screening Tool    ASSESSMENT:      Pt admitted with dizziness, severe anemia. EGD and colonscopy performed.  Noted no GI bleeding, diverticulosis and hiatal hernia  Past Medical History  Diagnosis Date  . Atrial flutter ([place])     a. on Xarelto; b. s/p successful TEE/DCCV on 08/28/2014  . Pulmonary fibrosis ([place])     a. not on home oxygen  . RA (rheumatoid arthritis) ([place])   . HTN (hypertension)   . 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 echo 07/2014: EF 20-25%, cannot exclude atrial flutter,  global HK, possible bicuspid Ao valve, mod reduced RV sys fxn, mild-mod aortic valve sclerosis/calcification, mod TR, mildly elevated RVSP; b. TEE 08/28/2014: EF 30-35%, no intracardic thrombus, mildly dilated LA/RA, mild TR, mild-mod aortic sclerosis w/o stenosis; c. echo 09/2014: EF 55-60%, select images w/ inf HK  . History of nuclear stress test     a. 09/02/2014: no sig ischemia, GI uptake noted, no sig WMA, EF 48%, no EKG chanes concerning for ischemia, low risk scan    . Normal coronary arteries     a. cardiac cath 12/03/2014: no sigificant CAD, right dominant system, LVEF >55%, no MR or AS  . Gangrene of finger ([place])    Pt reports for about 3-4 days prior to admission poor po intake secondary to weakness, fatigue otherwise healthy appetite. Tolerating clear liquids at this time eating 100% of tray  Medications reviewed: KCL Labs reviewed: K 3.2, BUN 33, creatinine 1.00, glucose 102, Mag 1.6  Diet Order:  Diet clear liquid Room service appropriate?:  Yes; Fluid consistency:: Thin  Skin:  Reviewed, no issues  Last BM:  6/28  Height:   Ht Readings from Last 1 Encounters:  11/22/15 5\' 10"  (1.778 m)    Weight: Noted wt on 6/12 at office visit of 217 lb 8 oz, 207 lb on admission (4% wt loss in the last 2 weeks)  Wt Readings from Last 1 Encounters:  11/22/15 207 lb (93.895 kg)    Ideal Body Weight:     BMI:  Body mass index is 29.7 kg/(m^2).  Estimated Nutritional Needs:   Kcal:  2350-2820 kcals/d  Protein:  113-141 g/d  Fluid:  >/= 2 L/d  EDUCATION NEEDS:   No education needs identified at this time  Jaydin Boniface B. Zenia Resides, [place], [place] (pager) Weekend/On-Call pager [phone])

## 2015-11-24 NOTE — Progress Notes (Addendum)
Pharmacy Consult for Electrolyte Monitoring and Replacement Indication: Hypokalemia  No Known Allergies  Patient Measurements: Height: 5\' 10"  (177.8 cm) Weight: 207 lb (93.895 kg) IBW/kg (Calculated) : 73  Vital Signs: Temp: 98.1 F (36.7 C) (06/28 0800) Temp Source: Oral (06/28 0800) BP: 96/61 mmHg (06/28 0800) Pulse Rate: 65 (06/28 0800) Intake/Output from previous day: 06/27 0701 - 06/28 0700 In: 520 [P.O.:100; Blood:420] Out: 870 [Urine:870] Intake/Output from this shift:    Labs:  Recent Labs  11/23/15 0355 11/23/15 1925 11/24/15 0735  WBC 9.7 8.6 8.0  HGB 7.2* 6.1* 7.6*  HCT 19.4* 17.0* 20.8*  PLT 201 197 176     Recent Labs  11/22/15 1042 11/23/15 0355 11/24/15 0735  NA 139 139 136  K 3.4* 3.8 3.2*  CL 104 104 103  CO2 25 24 25   GLUCOSE 123* 93 102*  BUN 29* 32* 33*  CREATININE 1.19 1.15 1.00  CALCIUM 9.0 8.8* 8.7*  PROT  --   --  7.6  ALBUMIN  --   --  3.5  AST  --   --  48*  ALT  --   --  36  ALKPHOS  --   --  52  BILITOT  --   --  3.9*   Estimated Creatinine Clearance: 95 mL/min (by C-G formula based on Cr of 1).    Recent Labs  11/22/15 1042 11/22/15 1449 11/22/15 1522  GLUCAP 113* 64* 92    Medical History: Past Medical History  Diagnosis Date  . Atrial flutter ([place])     a. on Xarelto; b. s/p successful TEE/DCCV on 08/28/2014  . Pulmonary fibrosis ([place])     a. not on home oxygen  . RA (rheumatoid arthritis) ([place])   . HTN (hypertension)   . 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 echo 07/2014: EF 20-25%, cannot exclude atrial flutter,  global HK, possible bicuspid Ao valve, mod reduced RV sys fxn, mild-mod aortic valve sclerosis/calcification, mod TR, mildly elevated RVSP; b. TEE 08/28/2014: EF 30-35%, no intracardic thrombus, mildly dilated LA/RA, mild TR, mild-mod aortic sclerosis w/o stenosis; c. echo 09/2014: EF 55-60%, select images w/ inf HK  . History of nuclear stress test     a. 09/02/2014: no sig ischemia, GI uptake  noted, no sig WMA, EF 48%, no EKG chanes concerning for ischemia, low risk scan    . Normal coronary arteries     a. cardiac cath 12/03/2014: no sigificant CAD, right dominant system, LVEF >55%, no MR or AS  . Gangrene of finger (HCC)     Medications:  Scheduled:  . diltiazem  30 mg Oral QID  . enalapril  20 mg Oral Daily  . fluticasone  2 spray Each Nare Daily  . metoprolol  75 mg Oral BID  . potassium chloride  40 mEq Oral Once  . sodium chloride flush  3 mL Intravenous Q12H   Infusions:     Assessment: Pharmacy consulted to assist in managing electrolytes in this 55 y/o M with anemia s/p transfusion.   Plan:  Will replace additional potassium 71mEq PO x 1. Will replace magnesium 4g IV x 1.    Will recheck electrolytes with am labs.   Pharmacy will continue to monitor and adjust per consult.   Alyse Low, Scott D/Kahlen Morais  11/24/2015,9:10 AM

## 2015-11-24 NOTE — Consult Note (Signed)
[name] is an 55 y.o. male.   Chief Complaint  Patient presents with  . Dizziness   HPI:  Patient was admitted through the emergency room following a fall at home with associated dizziness and weakness. Upon admission to the emergency room he was found to have severe anemia. Approximately 3 weeks ago patient reported that he began to feel weak and fatigued when walking to and from his work. Approximately June 12 patient's hemoglobin was normal at 14.8. He was found in the emergency room on June 26 to have a hemoglobin of 5.5. There was no evidence of acute bleeding noted. Patient has significant past medical history of rheumatoid arthritis, interstitial lung disease, mixed connective tissue disease-including positive FANA, RNP, anti-SM, Raynaud's, Discoid lupus, inflammatory arthritis, interstitial lung disease, elevated CPK. Patient has received transfusions of packed rbc's and hemoglobin appears to stabilized.  Past Medical History: Past Medical History  Diagnosis Date  . Atrial flutter ([place])     a. on Xarelto; b. s/p successful TEE/DCCV on 08/28/2014  . Pulmonary fibrosis ([place])     a. not on home oxygen  . RA (rheumatoid arthritis) ([place])   . HTN (hypertension)   . 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 echo 07/2014: EF 20-25%, cannot exclude atrial flutter,  global HK, possible bicuspid Ao valve, mod reduced RV sys fxn, mild-mod aortic valve sclerosis/calcification, mod TR, mildly elevated RVSP; b. TEE 08/28/2014: EF 30-35%, no intracardic thrombus, mildly dilated LA/RA, mild TR, mild-mod aortic sclerosis w/o stenosis; c. echo 09/2014: EF 55-60%, select images w/ inf HK  . History of nuclear stress test     a. 09/02/2014: no sig ischemia, GI uptake noted, no sig WMA, EF 48%, no EKG chanes concerning for ischemia, low risk scan    . Normal coronary arteries     a. cardiac cath 12/03/2014: no sigificant CAD, right dominant system, LVEF >55%, no MR or AS  . Gangrene of finger  [facility])     Past Surgical History: Past Surgical History  Procedure Laterality Date  . Cardiac catheterization N/A 12/03/2014    Procedure: Left Heart Cath and Coronary Angiography;  Surgeon: Minna Merritts, MD;  Location: [place] [facility];  Service: Cardiovascular;  Laterality: N/A;  . Peripheral vascular catheterization N/A 11/17/2015    Procedure: Upper Extremity Angiography;  Surgeon: Wellington Hampshire, MD;  Location: [place] [facility];  Service: Cardiovascular;  Laterality: N/A;  . Esophagogastroduodenoscopy (egd) with propofol N/A 11/23/2015    Procedure: ESOPHAGOGASTRODUODENOSCOPY (EGD) WITH PROPOFOL;  Surgeon: [name], MD;  Location: [facility] ENDOSCOPY;  Service: Endoscopy;  Laterality: N/A;  . Colonoscopy with propofol N/A 11/23/2015    Procedure: COLONOSCOPY WITH PROPOFOL;  Surgeon: [name], MD;  Location: [facility] ENDOSCOPY;  Service: Endoscopy;  Laterality: N/A;    Family History: Family History  Problem Relation Age of Onset  . Leukemia Mother     Social History:  reports that he quit smoking about 11 years ago. His smoking use included Cigarettes. He has a 20 pack-year smoking history. He has never used smokeless tobacco. He reports that he does not drink alcohol or use illicit drugs.  Allergies: No Known Allergies  Facility-administered medications prior to admission  Medication Dose Route Frequency Provider Last Rate Last Dose  . triamcinolone acetonide (KENALOG) 10 MG/ML injection 10 mg  10 mg Other Once [employer], DPM      . triamcinolone acetonide (KENALOG) 10 MG/ML injection 10 mg  10 mg Other Once [employer],  DPM       Medications Prior to Admission  Medication Sig Dispense Refill  . albuterol (PROVENTIL HFA;VENTOLIN HFA) 108 (90 Base) MCG/ACT inhaler Inhale 2 puffs into the lungs 4 (four) times daily as needed for wheezing or shortness of breath.    . diltiazem (CARDIZEM) 30 MG tablet Take 1 tablet (30 mg total) by mouth 4 (four) times daily. Every 6  hours as needed for heart rate greater than 90 bpm 30 tablet 0  . enalapril (VASOTEC) 20 MG tablet Take 20 mg by mouth daily.    . fluticasone (FLONASE) 50 MCG/ACT nasal spray Place 2 sprays into both nostrils daily. 16 g 0  . hydrochlorothiazide (MICROZIDE) 12.5 MG capsule Take 1 capsule (12.5 mg total) by mouth daily. 90 capsule 1  . meloxicam (MOBIC) 15 MG tablet Take 1 tablet (15 mg total) by mouth daily. 30 tablet 0  . metoprolol (LOPRESSOR) 50 MG tablet Take 75 mg by mouth 2 (two) times daily.    . rivaroxaban (XARELTO) 20 MG TABS tablet Take 20 mg by mouth every morning.      Results for orders placed or performed during the hospital encounter of 11/22/15 (from the past 48 hour(s))  Glucose, capillary     Status: Abnormal   Collection Time: 11/22/15  2:49 PM  Result Value Ref Range   Glucose-Capillary 64 (L) 65 - 99 mg/dL  Glucose, capillary     Status: None   Collection Time: 11/22/15  3:22 PM  Result Value Ref Range   Glucose-Capillary 92 65 - 99 mg/dL  Lactate dehydrogenase     Status: Abnormal   Collection Time: 11/22/15  3:56 PM  Result Value Ref Range   LDH 538 (H) 98 - 192 U/L  Haptoglobin     Status: Abnormal   Collection Time: 11/22/15  3:56 PM  Result Value Ref Range   Haptoglobin <10 (L) 34 - 200 mg/dL    Comment: (NOTE) Performed At: [facility] [place], [place] [id number] Lindon Romp MD [id number]   Basic metabolic panel     Status: Abnormal   Collection Time: 11/23/15  3:55 AM  Result Value Ref Range   Sodium 139 135 - 145 mmol/L   Potassium 3.8 3.5 - 5.1 mmol/L   Chloride 104 101 - 111 mmol/L   CO2 24 22 - 32 mmol/L   Glucose, Bld 93 65 - 99 mg/dL   BUN 32 (H) 6 - 20 mg/dL   Creatinine, Ser 1.15 0.61 - 1.24 mg/dL   Calcium 8.8 (L) 8.9 - 10.3 mg/dL   GFR calc non Af Amer >60 >60 mL/min   GFR calc Af Amer >60 >60 mL/min    Comment: (NOTE) The eGFR has been calculated using the CKD EPI equation. This calculation has not  been validated in all clinical situations. eGFR's persistently <60 mL/min signify possible Chronic Kidney Disease.    Anion gap 11 5 - 15  CBC     Status: Abnormal   Collection Time: 11/23/15  3:55 AM  Result Value Ref Range   WBC 9.7 3.8 - 10.6 K/uL   RBC 2.23 (L) 4.40 - 5.90 MIL/uL   Hemoglobin 7.2 (L) 13.0 - 18.0 g/dL   HCT 19.4 (L) 40.0 - 52.0 %   MCV 87.2 80.0 - 100.0 fL   MCH 32.4 26.0 - 34.0 pg   MCHC 37.1 (H) 32.0 - 36.0 g/dL   RDW 14.1 11.5 - 14.5 %   Platelets 201 150 -  440 K/uL  DAT, polyspecific, AHG ([facility] only)     Status: None   Collection Time: 11/23/15  7:25 PM  Result Value Ref Range   Polyspecific AHG test POS    DAT, IgG POS    DAT, complement POS    Blood Bank Correction      PREVIOUSLY REPORTED AS: NEGATIVE RESULT MODIFIED ON: 11/24/15 1030 SJL C/PAM MYERS   CBC with Differential/Platelet     Status: Abnormal   Collection Time: 11/23/15  7:25 PM  Result Value Ref Range   WBC 8.6 3.8 - 10.6 K/uL   RBC 1.83 (L) 4.40 - 5.90 MIL/uL   Hemoglobin 6.1 (L) 13.0 - 18.0 g/dL   HCT 17.0 (L) 40.0 - 52.0 %   MCV 92.9 80.0 - 100.0 fL   MCH 33.5 26.0 - 34.0 pg   MCHC 36.1 (H) 32.0 - 36.0 g/dL   RDW 14.1 11.5 - 14.5 %   Platelets 197 150 - 440 K/uL   Neutrophils Relative % 78 %   Neutro Abs 6.8 (H) 1.4 - 6.5 K/uL   Lymphocytes Relative 12 %   Lymphs Abs 1.1 1.0 - 3.6 K/uL   Monocytes Relative 7 %   Monocytes Absolute 0.6 0.2 - 1.0 K/uL   Eosinophils Relative 2 %   Eosinophils Absolute 0.2 0 - 0.7 K/uL   Basophils Relative 1 %   Basophils Absolute 0.0 0 - 0.1 K/uL  Prepare RBC     Status: None   Collection Time: 11/24/15  2:00 AM  Result Value Ref Range   Order Confirmation ORDER PROCESSED BY BLOOD BANK   Comprehensive metabolic panel     Status: Abnormal   Collection Time: 11/24/15  7:35 AM  Result Value Ref Range   Sodium 136 135 - 145 mmol/L   Potassium 3.2 (L) 3.5 - 5.1 mmol/L   Chloride 103 101 - 111 mmol/L   CO2 25 22 - 32 mmol/L   Glucose, Bld 102  (H) 65 - 99 mg/dL   BUN 33 (H) 6 - 20 mg/dL   Creatinine, Ser 1.00 0.61 - 1.24 mg/dL   Calcium 8.7 (L) 8.9 - 10.3 mg/dL   Total Protein 7.6 6.5 - 8.1 g/dL   Albumin 3.5 3.5 - 5.0 g/dL   AST 48 (H) 15 - 41 U/L   ALT 36 17 - 63 U/L   Alkaline Phosphatase 52 38 - 126 U/L   Total Bilirubin 3.9 (H) 0.3 - 1.2 mg/dL   GFR calc non Af Amer >60 >60 mL/min   GFR calc Af Amer >60 >60 mL/min    Comment: (NOTE) The eGFR has been calculated using the CKD EPI equation. This calculation has not been validated in all clinical situations. eGFR's persistently <60 mL/min signify possible Chronic Kidney Disease.    Anion gap 8 5 - 15  CBC     Status: Abnormal   Collection Time: 11/24/15  7:35 AM  Result Value Ref Range   WBC 8.0 3.8 - 10.6 K/uL   RBC 2.24 (L) 4.40 - 5.90 MIL/uL   Hemoglobin 7.6 (L) 13.0 - 18.0 g/dL   HCT 20.8 (L) 40.0 - 52.0 %   MCV 92.8 80.0 - 100.0 fL   MCH 34.0 26.0 - 34.0 pg   MCHC 36.7 (H) 32.0 - 36.0 g/dL   RDW 14.5 11.5 - 14.5 %   Platelets 176 150 - 440 K/uL  Magnesium     Status: Abnormal   Collection Time: 11/24/15  9:20 AM  Result Value Ref Range   Magnesium 1.6 (L) 1.7 - 2.4 mg/dL   No results found.  ROS: Review of Systems  Constitutional: Positive for malaise/fatigue. Negative for fever, chills, weight loss and diaphoresis.  HENT: Negative.   Eyes: Negative.   Respiratory: Negative for cough, hemoptysis, sputum production, shortness of breath and wheezing.   Cardiovascular: Negative for chest pain, palpitations, orthopnea, claudication, leg swelling and PND.  Gastrointestinal: Negative for heartburn, nausea, vomiting, abdominal pain, diarrhea, constipation, blood in stool and melena.  Genitourinary: Negative.   Musculoskeletal: Negative.   Skin: Negative.   Neurological: Positive for dizziness and weakness. Negative for tingling, focal weakness and seizures.  Endo/Heme/Allergies: Does not bruise/bleed easily.  Psychiatric/Behavioral: Negative for  depression. The patient is not nervous/anxious and does not have insomnia.     Vital Signs: Blood pressure 81/50, pulse 72, temperature 98.1 F (36.7 C), temperature source Oral, resp. rate 16, height _0  (1.778 m), weight 207 lb (93.895 kg), SpO2 95 %.  Physical Exam: Physical Exam  Nursing note and vitals reviewed. Constitutional: He is oriented to person, place, and time. He appears well-developed.  HENT:  Head: Normocephalic and atraumatic.  Eyes: Conjunctivae are normal. Pupils are equal, round, and reactive to light.  Neck: Normal range of motion. Neck supple.  Cardiovascular:  Irregular rate and rhythm, most consistent with atrial fibrillation  Respiratory: Effort normal and breath sounds normal.  GI: Soft. Bowel sounds are normal.  Musculoskeletal: Normal range of motion.  Neurological: He is alert and oriented to person, place, and time.  Skin: Skin is dry.  Psychiatric: He has a normal mood and affect. His behavior is normal. Judgment and thought content normal.     Assessment/Plan 1. Autoimmune mediated hemolytic anemia. With patient significant past medical history of mixed connective tissue disease-including positive FANA, RNP, anti-SM, Raynaud's, Discoid lupus, inflammatory arthritis, interstitial lung disease, elevated CPK. Patient is also followed for rheumatoid arthritis. He is currently on low-dose prednisone as well as Plaquenil under the direction of Dr. Cristi Loron act [facility] clinic rheumatology. Elevated LDH and bilirubin, and low haptoglobin concerning for hemolytic anemia. DAT (direct [place]) is reported as positive, flow cytometry is pending. Discussed results with patient and significant other. We'll start patient on prednisone 1 mg/kg, approximately 90 mg daily. Will also await flow cytometry to confirm there is no other significant findings. We will continue to follow patient throughout hospitalization. Patient will require outpatient follow-up with  Dr. Grayland Ormond upon discharge.  Dr. Grayland Ormond was available for consultation and review of plan of care for this patient.   [place]ville Govern Keiona Jenison 11/24/2015, 2:18 PM

## 2015-11-24 NOTE — Progress Notes (Signed)
[name] at [place] NAME: [name]    MR#:  [id number]  DATE OF BIRTH:  12-07-1960  SUBJECTIVE:  CHIEF COMPLAINT:   Chief Complaint  Patient presents with  . Dizziness    Came with dizziness and found to have severe anemia. Also had a drop in hemoglobin. Weight loss.    blood transfusion overnight. Hemoglobin came up.   Endoscopy and colonoscopy done- no active bleed,   LDH is high and haptoglobin is low.   Hb dropped again on 11/24/15 and given one more unit PRBC.  REVIEW OF SYSTEMS:  CONSTITUTIONAL: No fever,positive for fatigue or weakness.  EYES: No blurred or double vision.  EARS, NOSE, AND THROAT: No tinnitus or ear pain.  RESPIRATORY: No cough, shortness of breath, wheezing or hemoptysis.  CARDIOVASCULAR: No chest pain, orthopnea, edema.  GASTROINTESTINAL: No nausea, vomiting, diarrhea or abdominal pain.  GENITOURINARY: No dysuria, hematuria.  ENDOCRINE: No polyuria, nocturia,  HEMATOLOGY: No anemia, easy bruising or bleeding SKIN: No rash or lesion. MUSCULOSKELETAL: No joint pain or arthritis.   NEUROLOGIC: No tingling, numbness, weakness.  PSYCHIATRY: No anxiety or depression.   ROS  DRUG ALLERGIES:  No Known Allergies  VITALS:  Blood pressure 107/59, pulse 82, temperature 98.6 F (37 C), temperature source Oral, resp. rate 19, height 5\' 10"  (1.778 m), weight 93.895 kg (207 lb), SpO2 97 %.  PHYSICAL EXAMINATION:  GENERAL:  55 y.o.-year-old patient lying in the bed with no acute distress.  EYES: Pupils equal, round, reactive to light and accommodation. No scleral icterus. Extraocular muscles intact. Conjunctiva pale. HEENT: Head atraumatic, normocephalic. Oropharynx and nasopharynx clear.  NECK:  Supple, no jugular venous distention. No thyroid enlargement, no tenderness.  LUNGS: Normal breath sounds bilaterally, no wheezing, rales,rhonchi or crepitation. No use of accessory muscles of respiration.   CARDIOVASCULAR: S1, S2 normal. No murmurs, rubs, or gallops.  ABDOMEN: Soft, nontender, nondistended. Bowel sounds present. No organomegaly or mass.  EXTREMITIES: No pedal edema, cyanosis, or clubbing.  NEUROLOGIC: Cranial nerves II through XII are intact. Muscle strength 5/5 in all extremities. Sensation intact. Gait not checked.  PSYCHIATRIC: The patient is alert and oriented x 3.  SKIN: No obvious rash, lesion, or ulcer.   Physical Exam LABORATORY PANEL:   CBC  Recent Labs Lab 11/24/15 1645  WBC 9.0  HGB 8.6*  HCT 23.3*  PLT 206   ------------------------------------------------------------------------------------------------------------------  Chemistries   Recent Labs Lab 11/24/15 0735 11/24/15 0920  NA 136  --   K 3.2*  --   CL 103  --   CO2 25  --   GLUCOSE 102*  --   BUN 33*  --   CREATININE 1.00  --   CALCIUM 8.7*  --   MG  --  1.6*  AST 48*  --   ALT 36  --   ALKPHOS 52  --   BILITOT 3.9*  --    ------------------------------------------------------------------------------------------------------------------  Cardiac Enzymes No results for input(s): TROPONINI in the last 168 hours. ------------------------------------------------------------------------------------------------------------------  RADIOLOGY:  Dg Chest 1 View  11/24/2015  CLINICAL DATA:  Hypertension, dizziness. EXAM: CHEST 1 VIEW COMPARISON:  Radiograph of December 01, 2014. FINDINGS: Stable cardiomegaly. No pneumothorax is noted. Stable bilateral lung opacities are noted consistent with pulmonary fibrosis. No definite pleural effusion is noted. Bony thorax is unremarkable. IMPRESSION: Stable bilateral lung opacities are noted most consistent with pulmonary fibrosis. Superimposed inflammation or edema cannot be excluded. Electronically Signed   By: Sabino Dick  Brooke Bonito, M.D.   On: 11/24/2015 15:26    ASSESSMENT AND PLAN:   Active Problems:   Anemia   Iron deficiency anemia, unspecified    Hernia, hiatal   Acute esophagitis   Hemolytic anemia ([place])   1. Severe symptomatic anemia with falls. Acute drop in hemoglobin from 14 down to 5.5.   Received 2 units of transfusion by ER. Hemoglobin improved and dropped again., Given one more unit PRBC on 11/24/15   Iron studies shows no wine deficiency. MCV is normal.   LDH is high and haptoglobin is low suggestive of hemolysis.   As patient never had GI workup GI had done endoscopy and colonoscopy - no source for bleeding.   I also called hematology consult for possible hemodialysis.   Hold anti-coagulation for now. 2. History of atrial flutter.    Have some tachycardia, call cardio consult.  Stop Xarelto. Case discussed with Dr. Rockey Situ cardiology. On low-dose Cardizem 3. Pulmonary fibrosis. Continue inhalers 4. Rheumatoid arthritis 5. Hypokalemia replace potassium and stop Microzide 6. Hypotension   Given bolus IV fluid and he responded, monitor in stepdown.   Check infection work up.  All the records are reviewed and case discussed with Care Management/Social Workerr. Management plans discussed with the patient, family and they are in agreement.  CODE STATUS: full/  TOTAL TIME TAKING CARE OF THIS PATIENT: 35 critical care minutes.  Condition critical due to hypotension, a fib and hemolytic anemia.   POSSIBLE D/C IN 1-2 DAYS, DEPENDING ON CLINICAL CONDITION.  Vaughan Basta M.D on 11/24/2015   Between 7am to 6pm - Pager - [phone]  After 6pm go to [email or web address] - password [email or web address] [facility]  Office  [phone]  CC: Primary care physician; Otilio Miu, MD  Note: This dictation was prepared with Dragon dictation along with smaller phrase technology. Any transcriptional errors that result from this process are unintentional.

## 2015-11-24 NOTE — Progress Notes (Signed)
BP improved after 500 ml NS bolus given over 1 hr.  Now 121/54.  Monitors Afib 100-120. Cardizem po given. Will closely monitor bp.  Dr Anselm Jungling states he is ok to transfer to med-surg this evening if blood pressure WNL.

## 2015-11-24 NOTE — Progress Notes (Signed)
Dr Anselm Jungling called regarding low bp.  Pt had po cardizem this am but enalapril and metoprolol were held. No obvious signs of bleeding. Urine output is adequate but amber.  Orders 523ml bolus received. Hold off on transfer for now.

## 2015-11-25 ENCOUNTER — Ambulatory Visit: Payer: 59 | Admitting: Surgery

## 2015-11-25 DIAGNOSIS — D649 Anemia, unspecified: Secondary | ICD-10-CM | POA: Insufficient documentation

## 2015-11-25 DIAGNOSIS — I483 Typical atrial flutter: Secondary | ICD-10-CM

## 2015-11-25 DIAGNOSIS — R579 Shock, unspecified: Secondary | ICD-10-CM

## 2015-11-25 DIAGNOSIS — I499 Cardiac arrhythmia, unspecified: Secondary | ICD-10-CM

## 2015-11-25 DIAGNOSIS — R531 Weakness: Secondary | ICD-10-CM | POA: Insufficient documentation

## 2015-11-25 DIAGNOSIS — R578 Other shock: Secondary | ICD-10-CM | POA: Diagnosis present

## 2015-11-25 LAB — CBC
HEMATOCRIT: 19.4 % — AB (ref 40.0–52.0)
HEMOGLOBIN: 7 g/dL — AB (ref 13.0–18.0)
MCH: 32.9 pg (ref 26.0–34.0)
MCHC: 36.2 g/dL — AB (ref 32.0–36.0)
MCV: 90.8 fL (ref 80.0–100.0)
Platelets: 199 10*3/uL (ref 150–440)
RBC: 2.13 MIL/uL — AB (ref 4.40–5.90)
RDW: 14.6 % — ABNORMAL HIGH (ref 11.5–14.5)
WBC: 8.6 10*3/uL (ref 3.8–10.6)

## 2015-11-25 LAB — COMPREHENSIVE METABOLIC PANEL
ALK PHOS: 53 U/L (ref 38–126)
ALT: 33 U/L (ref 17–63)
ANION GAP: 4 — AB (ref 5–15)
AST: 40 U/L (ref 15–41)
Albumin: 3.3 g/dL — ABNORMAL LOW (ref 3.5–5.0)
BILIRUBIN TOTAL: 2.4 mg/dL — AB (ref 0.3–1.2)
BUN: 25 mg/dL — ABNORMAL HIGH (ref 6–20)
CALCIUM: 8.9 mg/dL (ref 8.9–10.3)
CO2: 25 mmol/L (ref 22–32)
CREATININE: 0.94 mg/dL (ref 0.61–1.24)
Chloride: 107 mmol/L (ref 101–111)
GFR calc non Af Amer: >60 mL/min (ref >60)
GLUCOSE: 127 mg/dL — AB (ref 65–99)
Potassium: 4.7 mmol/L (ref 3.5–5.1)
SODIUM: 136 mmol/L (ref 135–145)
TOTAL PROTEIN: 7.5 g/dL (ref 6.5–8.1)

## 2015-11-25 LAB — MAGNESIUM: Magnesium: 2.3 mg/dL (ref 1.7–2.4)

## 2015-11-25 LAB — MRSA PCR SCREENING: MRSA by PCR: NEGATIVE

## 2015-11-25 LAB — PHOSPHORUS: Phosphorus: 3.5 mg/dL (ref 2.5–4.6)

## 2015-11-25 MED ORDER — ALUM & MAG HYDROXIDE-SIMETH 200-200-20 MG/5ML PO SUSP
30.0000 mL | ORAL | Status: DC | PRN
  Administered 2015-11-25: 30 mL via ORAL
  Filled 2015-11-25: qty 30

## 2015-11-25 NOTE — Progress Notes (Signed)
The patient had no sign of active GI bleeding or sign of any source of the Anemia from his EGD and colonoscopy.  The patient's labs appear to be more consistent with homolysis. I will sign off.  Please call if any further GI concerns or questions.  We would like to thank you for the opportunity to participate in the care of [name].

## 2015-11-25 NOTE — Consult Note (Signed)
Cardiology Consultation Note  Patient ID: [name], MRN: [id number], DOB/AGE: 56-Sep-1962 56 y.o. Admit date: 11/22/2015   Date of Consult: 11/25/2015 Primary Physician: Otilio Miu, MD Primary Cardiologist: Dr. Rockey Situ, MD Requesting Physician: Dr. Anselm Jungling, MD  Chief Complaint: Dizziness Reason for Consult: PACs/PVCs in the setting of hemorrhagic shock  HPI: 55 y.o. male with h/o atrial flutter s/p TEE/DCCV in 08/2014 on Xarelto, history of tachycardia induced cardiomyopathy with prior EF as low as 30-35% now improved to 60-65% as of 11/22/15 by echo, pulmonary fibrosis not on home O2, RA, and normal coronary arteries by cardiac cath who presented to [facility] on 6/26 with dizziness and was found to have hemorrhagic shock. Cardiology is consulted for PACs/PVCs in the setting of the above.   Patient called the answering service early in the week complaining of dizziness. He was advised to come to the office for evaluation. Unfortunately, he ended up needing to present to the ED. Upon his arrival he was found to be hypotensive with SBP in the 80's and have a new onset severe anemia of 5.5. He underwent PRBC transfusion with improvement in his HGB to 8.7 on 6/28, unfortunately this has trending back down to 7.0 on 6/29. He has been seen by GI and undergone colonoscopy and EGD which did not show an active bleed. Echo upon admission showed continued improvement in his LVSF, currently at 65% with normal PASP. He was noted to have frequent PACs and PVCs during this study. Upon admission he was noted to be hypokalemia at 3.4 s/p repletion at this time. He denies any BRBPR or melena. He does note some hematuria. No chest pain. BP has remained soft in the [id number] systolic currently.    Past Medical History  Diagnosis Date  . Atrial flutter ([place])     a. on Xarelto; b. s/p successful TEE/DCCV on 08/28/2014  . Pulmonary fibrosis ([place])     a. not on home oxygen  . RA (rheumatoid arthritis) ([place])   . HTN  (hypertension)   . 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 echo 07/2014: EF 20-25%, cannot exclude atrial flutter,  global HK, possible bicuspid Ao valve, mod reduced RV sys fxn, mild-mod aortic valve sclerosis/calcification, mod TR, mildly elevated RVSP; b. TEE 08/28/2014: EF 30-35%, no intracardic thrombus, mildly dilated LA/RA, mild TR, mild-mod aortic sclerosis w/o stenosis; c. echo 09/2014: EF 55-60%, select images w/ inf HK  . History of nuclear stress test     a. 09/02/2014: no sig ischemia, GI uptake noted, no sig WMA, EF 48%, no EKG chanes concerning for ischemia, low risk scan    . Normal coronary arteries     a. cardiac cath 12/03/2014: no sigificant CAD, right dominant system, LVEF >55%, no MR or AS  . Gangrene of finger ([place])       Most Recent Cardiac Studies: Echo 11/22/2015: Study Conclusions  - Left ventricle: The cavity size was normal. Systolic function was  normal. The estimated ejection fraction was in the range of 60%  to 65%. Wall motion was normal; there were no regional wall  motion abnormalities. Left ventricular diastolic function  parameters were normal. - Left atrium: The atrium was at the upper limits of normal in  size. - Right ventricle: Systolic function was normal. - Pulmonary arteries: Systolic pressure was within the normal  range.  Impressions:  - Challenging image quality. Frequent APCs and PVCs.   Surgical History:  Past Surgical History  Procedure Laterality Date  . Cardiac  catheterization N/A 12/03/2014    Procedure: Left Heart Cath and Coronary Angiography;  Surgeon: Minna Merritts, MD;  Location: [place] [facility];  Service: Cardiovascular;  Laterality: N/A;  . Peripheral vascular catheterization N/A 11/17/2015    Procedure: Upper Extremity Angiography;  Surgeon: Wellington Hampshire, MD;  Location: [place] [facility];  Service: Cardiovascular;  Laterality: N/A;  . Esophagogastroduodenoscopy (egd) with propofol N/A 11/23/2015     Procedure: ESOPHAGOGASTRODUODENOSCOPY (EGD) WITH PROPOFOL;  Surgeon: [name], MD;  Location: [facility];  Service: Endoscopy;  Laterality: N/A;  . Colonoscopy with propofol N/A 11/23/2015    Procedure: COLONOSCOPY WITH PROPOFOL;  Surgeon: [name], MD;  Location: [facility] ENDOSCOPY;  Service: Endoscopy;  Laterality: N/A;     Home Meds: Prior to Admission medications   Medication Sig Start Date End Date Taking? Authorizing Provider  albuterol (PROVENTIL HFA;VENTOLIN HFA) 108 (90 Base) MCG/ACT inhaler Inhale 2 puffs into the lungs 4 (four) times daily as needed for wheezing or shortness of breath.   Yes Historical Provider, MD  diltiazem (CARDIZEM) 30 MG tablet Take 1 tablet (30 mg total) by mouth 4 (four) times daily. Every 6 hours as needed for heart rate greater than 90 bpm 12/11/14  Yes Jeannine Pennisi M Jonathan Kirkendoll, PA-C  enalapril (VASOTEC) 20 MG tablet Take 20 mg by mouth daily.   Yes Historical Provider, MD  fluticasone (FLONASE) 50 MCG/ACT nasal spray Place 2 sprays into both nostrils daily. 06/10/15  Yes Lorin Picket, PA-C  hydrochlorothiazide (MICROZIDE) 12.5 MG capsule Take 1 capsule (12.5 mg total) by mouth daily. 12/11/14  Yes Candas Deemer M Monserat Prestigiacomo, PA-C  meloxicam (MOBIC) 15 MG tablet Take 1 tablet (15 mg total) by mouth daily. 06/18/15  Yes Titorya Stover, DPM  metoprolol (LOPRESSOR) 50 MG tablet Take 75 mg by mouth 2 (two) times daily.   Yes Historical Provider, MD  rivaroxaban (XARELTO) 20 MG TABS tablet Take 20 mg by mouth every morning.   Yes Historical Provider, MD    Inpatient Medications:  . diltiazem  30 mg Oral QID  . fluticasone  2 spray Each Nare Daily  . metoprolol  75 mg Oral BID  . predniSONE  90 mg Oral Q breakfast  . sodium chloride flush  3 mL Intravenous Q12H      Allergies: No Known Allergies  Social History   Social History  . Marital Status: Married    Spouse Name: N/A  . Number of Children: N/A  . Years of Education: N/A   Occupational History  . Not on file.    Social History Main Topics  . Smoking status: Former Smoker -- 1.00 packs/day for 20 years    Types: Cigarettes    Quit date: 08/30/2004  . Smokeless tobacco: Never Used  . Alcohol Use: No  . Drug Use: No  . Sexual Activity: Not on file   Other Topics Concern  . Not on file   Social History Narrative     Family History  Problem Relation Age of Onset  . Leukemia Mother      Review of Systems: Review of Systems  Constitutional: Positive for weight loss and malaise/fatigue. Negative for fever, chills and diaphoresis.  HENT: Negative for congestion.   Eyes: Negative for discharge and redness.  Respiratory: Positive for cough and shortness of breath. Negative for sputum production and wheezing.   Cardiovascular: Negative for chest pain, palpitations, orthopnea, claudication, leg swelling and PND.  Gastrointestinal: Negative for nausea, vomiting, blood in stool and melena.  Genitourinary:  Positive for hematuria.  Musculoskeletal: Negative for falls.  Skin: Negative for rash.  Neurological: Positive for dizziness and weakness. Negative for tingling, tremors, sensory change, speech change, focal weakness and loss of consciousness.  Endo/Heme/Allergies: Does not bruise/bleed easily.  Psychiatric/Behavioral: The patient is not nervous/anxious.   All other systems reviewed and are negative.   Labs: No results for input(s): CKTOTAL, CKMB, TROPONINI in the last 72 hours. Lab Results  Component Value Date   WBC 8.6 11/25/2015   HGB 7.0* 11/25/2015   HCT 19.4* 11/25/2015   MCV 90.8 11/25/2015   PLT 199 11/25/2015     Recent Labs Lab 11/25/15 0430  NA 136  K 4.7  CL 107  CO2 25  BUN 25*  CREATININE 0.94  CALCIUM 8.9  PROT 7.5  BILITOT 2.4*  ALKPHOS 53  ALT 33  AST 40  GLUCOSE 127*   Lab Results  Component Value Date   CHOL 88 08/25/2014   HDL 24* 08/25/2014   LDLCALC 39 08/25/2014   TRIG 124 08/25/2014   No results found for: DDIMER  Radiology/Studies:   Dg Chest 1 View  11/24/2015  CLINICAL DATA:  Hypertension, dizziness. EXAM: CHEST 1 VIEW COMPARISON:  Radiograph of December 01, 2014. FINDINGS: Stable cardiomegaly. No pneumothorax is noted. Stable bilateral lung opacities are noted consistent with pulmonary fibrosis. No definite pleural effusion is noted. Bony thorax is unremarkable. IMPRESSION: Stable bilateral lung opacities are noted most consistent with pulmonary fibrosis. Superimposed inflammation or edema cannot be excluded. Electronically Signed   By: Marijo Conception, M.D.   On: 11/24/2015 15:26    EKG: Interpreted by me showed: NSR, 91 bpm, left axis deviation, incomplete RBBB, poor R wave progression, no acute st/t changes Telemetry: Interpreted by me showed: NSR with frequent PACs and PVCs with HR in the 80s to low 100s. No evidence of Afib/flutter  Weights: Filed Weights   11/22/15 1023  Weight: 207 lb (93.895 kg)     Physical Exam: Blood pressure 96/74, pulse 63, temperature 99 F (37.2 C), temperature source Oral, resp. rate 17, height 5\' 10"  (1.778 m), weight 207 lb (93.895 kg), SpO2 97 %. Body mass index is 29.7 kg/(m^2). General: Frail appearing, in no acute distress. Head: Normocephalic, atraumatic, sclera non-icteric, no xanthomas, nares are without discharge.  Neck: Negative for carotid bruits. JVD not elevated. Lungs: Clear bilaterally to auscultation without wheezes, rales, or rhonchi. Breathing is unlabored. Heart: Irregular with S1 S2. No murmurs, rubs, or gallops appreciated. Abdomen: Soft, non-tender, non-distended with normoactive bowel sounds. No hepatomegaly. No rebound/guarding. No obvious abdominal masses. Msk:  Strength and tone appear normal for age. Extremities: No clubbing or cyanosis. No edema. Distal pedal pulses are 2+ and equal bilaterally. Neuro: Alert and oriented X 3. No facial asymmetry. No focal deficit. Moves all extremities spontaneously. Psych:  Responds to questions appropriately with a normal  affect.    Assessment and Plan:  Principal Problem:   Hemorrhagic shock Active Problems:   Arrhythmia   Anemia   Iron deficiency anemia, unspecified   Hernia, hiatal   Acute esophagitis   Hemolytic anemia (HCC)    1. Hemorrhagic shock: -He presented with HGB of 5.5 -HGB continues to down trend, 7.0 today from 8.6 on 6/28 s/p prior transfusion -Colonoscopy/EGD without active bleed -Treat underlying bleed to resolve patient's PACs/PVCs and improve BP -Maintain a MAP > 65 -Per IM  2. Arrhythmia/sinus tachycardia: -No evidence of Afib/flutter -PACs/PVCs in the setting of the patient's acute illness/bleed -Surprised the patient's  HR is not actually more tachycardic -His mildly elevated HR is a normal physiological response to #1 -Aggressive treating of fast heart rate could be detrimental to the patient as this perfusing him in the setting of his severe acute anemia and hypotension leading to possible cardiopulmonary collapse and death -Would monitor and treat only if unstable in the setting of the above  3. History of atrial flutter: -No evidence on tele -Eliquis on hold 2/2 severe anemia -Avoid aggressive rate controlling agents at this time as above -CHADS2VASc at least 2 (CHF, HTN)  4. Hypokalemia: -Resolved  5. Tachycardia induced cardiomyoapthy: -Most recent EF improved to 65% on 11/22/2015 -Has normal coronary arteries  -On Lopressor -As above  6. Hematuria: -Per IM -Question leading to the above? -Consider abdominal imaging, defer to IM   ICU aspects of patient care:  F - full diet A - Tylenol  S - None T - Sequential pneumatic compression  H - none U - not on PPI, defer to IM G - cmet B - normal I - none D - none  Signed, Christell Faith, PA-C [facility] Pager: [phone] 11/25/2015, 7:34 AM

## 2015-11-25 NOTE — Care Management (Signed)
Patient lives at home with spouse. He is ambulatory and independent at baseline. No DME. No home health. No home O2.  PCP is Dr. Otilio Miu. Presents with dizziness and falls.  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 PMH: Mixed connective tissue disease.  Do not anticipate any needs at discharge.

## 2015-11-25 NOTE — Progress Notes (Signed)
[place] at [place] NAME: [name]    MR#:  [id number]  DATE OF BIRTH:  12-22-1960  SUBJECTIVE:  CHIEF COMPLAINT:   Chief Complaint  Patient presents with  . Dizziness    Came with dizziness and found to have severe anemia. Also had a drop in hemoglobin. Weight loss.    blood transfusion overnight. Hemoglobin came up.   Endoscopy and colonoscopy done- no active bleed,   LDH is high and haptoglobin is low.   Hb dropped again on 11/24/15 and given one more unit PRBC.   Hb > 7, HR under control now.  REVIEW OF SYSTEMS:  CONSTITUTIONAL: No fever,positive for fatigue or weakness.  EYES: No blurred or double vision.  EARS, NOSE, AND THROAT: No tinnitus or ear pain.  RESPIRATORY: No cough, shortness of breath, wheezing or hemoptysis.  CARDIOVASCULAR: No chest pain, orthopnea, edema.  GASTROINTESTINAL: No nausea, vomiting, diarrhea or abdominal pain.  GENITOURINARY: No dysuria, hematuria.  ENDOCRINE: No polyuria, nocturia,  HEMATOLOGY: No anemia, easy bruising or bleeding SKIN: No rash or lesion. MUSCULOSKELETAL: No joint pain or arthritis.   NEUROLOGIC: No tingling, numbness, weakness.  PSYCHIATRY: No anxiety or depression.   ROS  DRUG ALLERGIES:  No Known Allergies  VITALS:  Blood pressure 131/94, pulse 25, temperature 98.5 F (36.9 C), temperature source Oral, resp. rate 22, height 5\' 10"  (1.778 m), weight 93.895 kg (207 lb), SpO2 79 %.  PHYSICAL EXAMINATION:  GENERAL:  55 y.o.-year-old patient lying in the bed with no acute distress.  EYES: Pupils equal, round, reactive to light and accommodation. No scleral icterus. Extraocular muscles intact. Conjunctiva pale. HEENT: Head atraumatic, normocephalic. Oropharynx and nasopharynx clear.  NECK:  Supple, no jugular venous distention. No thyroid enlargement, no tenderness.  LUNGS: Normal breath sounds bilaterally, no wheezing, rales,rhonchi or crepitation. No use of accessory  muscles of respiration.  CARDIOVASCULAR: S1, S2 normal. No murmurs, rubs, or gallops.  ABDOMEN: Soft, nontender, nondistended. Bowel sounds present. No organomegaly or mass.  EXTREMITIES: No pedal edema, cyanosis, or clubbing.  NEUROLOGIC: Cranial nerves II through XII are intact. Muscle strength 5/5 in all extremities. Sensation intact. Gait not checked.  PSYCHIATRIC: The patient is alert and oriented x 3.  SKIN: No obvious rash, lesion, or ulcer.   Physical Exam LABORATORY PANEL:   CBC  Recent Labs Lab 11/25/15 0430  WBC 8.6  HGB 7.0*  HCT 19.4*  PLT 199   ------------------------------------------------------------------------------------------------------------------  Chemistries   Recent Labs Lab 11/25/15 0430  NA 136  K 4.7  CL 107  CO2 25  GLUCOSE 127*  BUN 25*  CREATININE 0.94  CALCIUM 8.9  MG 2.3  AST 40  ALT 33  ALKPHOS 53  BILITOT 2.4*   ------------------------------------------------------------------------------------------------------------------  Cardiac Enzymes No results for input(s): TROPONINI in the last 168 hours. ------------------------------------------------------------------------------------------------------------------  RADIOLOGY:  Dg Chest 1 View  11/24/2015  CLINICAL DATA:  Hypertension, dizziness. EXAM: CHEST 1 VIEW COMPARISON:  Radiograph of December 01, 2014. FINDINGS: Stable cardiomegaly. No pneumothorax is noted. Stable bilateral lung opacities are noted consistent with pulmonary fibrosis. No definite pleural effusion is noted. Bony thorax is unremarkable. IMPRESSION: Stable bilateral lung opacities are noted most consistent with pulmonary fibrosis. Superimposed inflammation or edema cannot be excluded. Electronically Signed   By: Marijo Conception, M.D.   On: 11/24/2015 15:26    ASSESSMENT AND PLAN:   Principal Problem:   Hemorrhagic shock Active Problems:   Anemia   Iron deficiency anemia, unspecified  Hernia, hiatal   Acute  esophagitis   Hemolytic anemia (HCC)   Arrhythmia   Absolute anemia   Weakness   1. Severe symptomatic anemia with falls. Acute drop in hemoglobin from 14 down to 5.5. Immune mediated hemolytic anemia  Received 2 units of transfusion by ER. Hemoglobin improved and dropped again., Given one more unit PRBC on 11/24/15   Iron studies shows no wine deficiency. MCV is normal.   LDH is high and haptoglobin is low suggestive of hemolysis.   As patient never had GI workup GI had done endoscopy and colonoscopy - no source for bleeding.   I also called hematology consult for possible hemolysis.   Hold anti-coagulation for now.   Appreciated hematology consult- ordered autoimmune work ups.    Started on oral steroids.  2. History of atrial flutter.    Have some tachycardia, call cardio consult.  Stop Xarelto. Case discussed with Dr. Rockey Situ cardiology. On low-dose Cardizem   Rate is reasonable for anemia, monitoring for now. 3. Pulmonary fibrosis. Continue inhalers 4. Rheumatoid arthritis 5. Hypokalemia replace potassium and stop Microzide 6. Hypotension   Given bolus IV fluid and he responded, .   Check infection work up- negative so far, stable now.  All the records are reviewed and case discussed with Care Management/Social Workerr. Management plans discussed with the patient, family and they are in agreement.  CODE STATUS: full/  TOTAL TIME TAKING CARE OF THIS PATIENT: 35  minutes.    POSSIBLE D/C IN 1-2 DAYS, DEPENDING ON hematologist opinion due to hemolytic anemia  Vaughan Basta M.D on 11/25/2015   Between 7am to 6pm - Pager - [phone]  After 6pm go to [email or web address] - password [email or web address] [facility]  Office  [phone]  CC: Primary care physician; Otilio Miu, MD  Note: This dictation was prepared with Dragon dictation along with smaller phrase technology. Any transcriptional errors that result from this process are unintentional.

## 2015-11-26 ENCOUNTER — Other Ambulatory Visit: Payer: 59

## 2015-11-26 LAB — TYPE AND SCREEN
ABO/RH(D): O POS
Antibody Screen: NEGATIVE
UNIT DIVISION: 0
UNIT DIVISION: 0
Unit division: 0
Unit division: 0

## 2015-11-26 LAB — CBC
HCT: 18.7 % — ABNORMAL LOW (ref 40.0–52.0)
Hemoglobin: 6.6 g/dL — ABNORMAL LOW (ref 13.0–18.0)
MCH: 32.6 pg (ref 26.0–34.0)
MCHC: 35.6 g/dL (ref 32.0–36.0)
MCV: 91.6 fL (ref 80.0–100.0)
PLATELETS: 192 10*3/uL (ref 150–440)
RBC: 2.04 MIL/uL — ABNORMAL LOW (ref 4.40–5.90)
RDW: 14.5 % (ref 11.5–14.5)
WBC: 8.7 10*3/uL (ref 3.8–10.6)

## 2015-11-26 LAB — PREPARE RBC (CROSSMATCH)

## 2015-11-26 MED ORDER — SODIUM CHLORIDE 0.9 % IV SOLN
Freq: Once | INTRAVENOUS | Status: AC
  Administered 2015-11-26: 13:00:00 via INTRAVENOUS

## 2015-11-26 NOTE — Progress Notes (Signed)
[place] at [place] NAME: [name]    MR#:  [id number]  DATE OF BIRTH:  Nov 04, 1960  SUBJECTIVE:  CHIEF COMPLAINT:   Chief Complaint  Patient presents with  . Dizziness    Came with dizziness and found to have severe anemia. Also had a drop in hemoglobin. Weight loss.    blood transfusion overnight. Hemoglobin came up.   Endoscopy and colonoscopy done- no active bleed,   LDH is high and haptoglobin is low.   Hb dropped again on 11/24/15 and given one more unit PRBC.   Hb was up after transfusion, now dropped < 7, pt agreed on transfusion today.  REVIEW OF SYSTEMS:  CONSTITUTIONAL: No fever,positive for fatigue or weakness.  EYES: No blurred or double vision.  EARS, NOSE, AND THROAT: No tinnitus or ear pain.  RESPIRATORY: No cough, shortness of breath, wheezing or hemoptysis.  CARDIOVASCULAR: No chest pain, orthopnea, edema.  GASTROINTESTINAL: No nausea, vomiting, diarrhea or abdominal pain.  GENITOURINARY: No dysuria, hematuria.  ENDOCRINE: No polyuria, nocturia,  HEMATOLOGY: No anemia, easy bruising or bleeding SKIN: No rash or lesion. MUSCULOSKELETAL: No joint pain or arthritis.   NEUROLOGIC: No tingling, numbness, weakness.  PSYCHIATRY: No anxiety or depression.   ROS  DRUG ALLERGIES:  No Known Allergies  VITALS:  Blood pressure 124/68, pulse 60, temperature 98.6 F (37 C), temperature source Oral, resp. rate 18, height 5\' 10"  (1.778 m), weight 93.895 kg (207 lb), SpO2 98 %.  PHYSICAL EXAMINATION:  GENERAL:  55 y.o.-year-old patient lying in the bed with no acute distress.  EYES: Pupils equal, round, reactive to light and accommodation. No scleral icterus. Extraocular muscles intact. Conjunctiva pale. HEENT: Head atraumatic, normocephalic. Oropharynx and nasopharynx clear.  NECK:  Supple, no jugular venous distention. No thyroid enlargement, no tenderness.  LUNGS: Normal breath sounds bilaterally, no wheezing,  rales,rhonchi or crepitation. No use of accessory muscles of respiration.  CARDIOVASCULAR: S1, S2 normal. No murmurs, rubs, or gallops.  ABDOMEN: Soft, nontender, nondistended. Bowel sounds present. No organomegaly or mass.  EXTREMITIES: No pedal edema, cyanosis, or clubbing.  NEUROLOGIC: Cranial nerves II through XII are intact. Muscle strength 5/5 in all extremities. Sensation intact. Gait not checked.  PSYCHIATRIC: The patient is alert and oriented x 3.  SKIN: No obvious rash, lesion, or ulcer.   Physical Exam LABORATORY PANEL:   CBC  Recent Labs Lab 11/26/15 0344  WBC 8.7  HGB 6.6*  HCT 18.7*  PLT 192   ------------------------------------------------------------------------------------------------------------------  Chemistries   Recent Labs Lab 11/25/15 0430  NA 136  K 4.7  CL 107  CO2 25  GLUCOSE 127*  BUN 25*  CREATININE 0.94  CALCIUM 8.9  MG 2.3  AST 40  ALT 33  ALKPHOS 53  BILITOT 2.4*   ------------------------------------------------------------------------------------------------------------------  Cardiac Enzymes No results for input(s): TROPONINI in the last 168 hours. ------------------------------------------------------------------------------------------------------------------  RADIOLOGY:  No results found.  ASSESSMENT AND PLAN:   Principal Problem:   Hemorrhagic shock Active Problems:   Anemia   Iron deficiency anemia, unspecified   Hernia, hiatal   Acute esophagitis   Hemolytic anemia (HCC)   Arrhythmia   Absolute anemia   Weakness   1. Severe symptomatic anemia with falls. Acute drop in hemoglobin from 14 down to 5.5. Immune mediated hemolytic anemia  Received 2 units of transfusion by ER. Hemoglobin improved and dropped again., Given one more unit PRBC on 11/24/15   Iron studies shows no Iron deficiency. MCV is normal.  LDH is high and haptoglobin is low suggestive of hemolysis.   As patient never had GI workup GI had done  endoscopy and colonoscopy - no source for bleeding.   hematology consult for possible hemolysis.   Hold anti-coagulation for now.   Appreciated hematology consult- ordered autoimmune work ups.    Started on oral steroids.    Inspite of 2 days with oral steroids, still Hb keep dropping- Called Dr.Finnegan, discussed with hime- he agreed with transfusion, and he will speak to his Nurse prectioner to start on IV IG.  2. History of atrial flutter.    Have some tachycardia, call cardio consult.  Stop Xarelto. Case discussed with Dr. Rockey Situ cardiology. On low-dose Cardizem   Rate is reasonable for anemia, monitoring for now. 3. Pulmonary fibrosis. Continue inhalers 4. Rheumatoid arthritis 5. Hypokalemia replace potassium and stop Microzide 6. Hypotension   Given bolus IV fluid and he responded, .   Check infection work up- negative so far, stable now.  All the records are reviewed and case discussed with Care Management/Social Workerr. Management plans discussed with the patient, family and they are in agreement.  CODE STATUS: full/  TOTAL TIME TAKING CARE OF THIS PATIENT: 35  minutes.    POSSIBLE D/C IN 1-2 DAYS, DEPENDING ON hematologist opinion due to hemolytic anemia  Vaughan Basta M.D on 11/26/2015   Between 7am to 6pm - Pager - [phone]  After 6pm go to [email or web address] - password [email or web address] [facility]  Office  [phone]  CC: Primary care physician; Otilio Miu, MD  Note: This dictation was prepared with Dragon dictation along with smaller phrase technology. Any transcriptional errors that result from this process are unintentional.

## 2015-11-26 NOTE — Progress Notes (Signed)
Pharmacy Consult for Electrolyte Monitoring and Replacement Indication: Hypokalemia  No Known Allergies  Patient Measurements: Height: 5\' 10"  (177.8 cm) Weight: 207 lb (93.895 kg) IBW/kg (Calculated) : 73  Vital Signs: Temp: 98.5 F (36.9 C) (06/30 0923) Temp Source: Oral (06/30 0923) BP: 117/70 mmHg (06/30 0923) Pulse Rate: 73 (06/30 0923) Intake/Output from previous day: 06/29 0701 - 06/30 0700 In: 1203 [P.O.:1200; I.V.:3] Out: 175 [Urine:175] Intake/Output from this shift: Total I/O In: 240 [P.O.:240] Out: -   Labs:  Recent Labs  11/24/15 1645 11/25/15 0430 11/26/15 0344  WBC 9.0 8.6 8.7  HGB 8.6* 7.0* 6.6*  HCT 23.3* 19.4* 18.7*  PLT 206 199 192     Recent Labs  11/24/15 0735 11/24/15 0920 11/24/15 1522 11/25/15 0430  NA 136  --   --  136  K 3.2*  --   --  4.7  CL 103  --   --  107  CO2 25  --   --  25  GLUCOSE 102*  --   --  127*  BUN 33*  --   --  25*  CREATININE 1.00  --   --  0.94  CALCIUM 8.7*  --   --  8.9  MG  --  1.6*  --  2.3  PHOS  --   --   --  3.5  PROT 7.6  --   --  7.5  ALBUMIN 3.5  --   --  3.3*  AST 48*  --   --  40  ALT 36  --   --  33  ALKPHOS 52  --   --  53  BILITOT 3.9*  --   --  2.4*  BILIDIR  --   --  0.7*  --    Estimated Creatinine Clearance: 101 mL/min (by C-G formula based on Cr of 0.94).   No results for input(s): GLUCAP in the last 72 hours.  Medical History: Past Medical History  Diagnosis Date  . Atrial flutter ([place])     a. on Xarelto; b. s/p successful TEE/DCCV on 08/28/2014  . Pulmonary fibrosis ([place])     a. not on home oxygen  . RA (rheumatoid arthritis) ([place])   . HTN (hypertension)   . 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 echo 07/2014: EF 20-25%, cannot exclude atrial flutter,  global HK, possible bicuspid Ao valve, mod reduced RV sys fxn, mild-mod aortic valve sclerosis/calcification, mod TR, mildly elevated RVSP; b. TEE 08/28/2014: EF 30-35%, no intracardic thrombus, mildly dilated LA/RA, mild  TR, mild-mod aortic sclerosis w/o stenosis; c. echo 09/2014: EF 55-60%, select images w/ inf HK  . History of nuclear stress test     a. 09/02/2014: no sig ischemia, GI uptake noted, no sig WMA, EF 48%, no EKG chanes concerning for ischemia, low risk scan    . Normal coronary arteries     a. cardiac cath 12/03/2014: no sigificant CAD, right dominant system, LVEF >55%, no MR or AS  . Gangrene of finger (HCC)     Medications:  Scheduled:  . sodium chloride   Intravenous Once  . diltiazem  30 mg Oral QID  . fluticasone  2 spray Each Nare Daily  . metoprolol  75 mg Oral BID  . predniSONE  90 mg Oral Q breakfast  . sodium chloride flush  3 mL Intravenous Q12H   Infusions:     Assessment: Pharmacy consulted to assist in managing electrolytes in this 55 y/o M with anemia s/p  transfusion.   Plan:   Will recheck electrolytes with am labs.   Pharmacy will continue to monitor and adjust per consult.   Dicie Beam D 11/26/2015,10:08 AM

## 2015-11-26 NOTE — Progress Notes (Signed)
Patient states that he only takes the Cardizem if his HR is >90, prn at home, does not take it very often, if at all. Barbaraann Faster, RN 3:23 AM 11/26/2015

## 2015-11-27 LAB — CBC
HEMATOCRIT: 22.4 % — AB (ref 40.0–52.0)
Hemoglobin: 7.9 g/dL — ABNORMAL LOW (ref 13.0–18.0)
MCH: 31.4 pg (ref 26.0–34.0)
MCHC: 35.2 g/dL (ref 32.0–36.0)
MCV: 89.3 fL (ref 80.0–100.0)
Platelets: 197 10*3/uL (ref 150–440)
RBC: 2.51 MIL/uL — ABNORMAL LOW (ref 4.40–5.90)
RDW: 16.1 % — AB (ref 11.5–14.5)
WBC: 7.9 10*3/uL (ref 3.8–10.6)

## 2015-11-27 LAB — BASIC METABOLIC PANEL
Anion gap: 5 (ref 5–15)
BUN: 25 mg/dL — AB (ref 6–20)
CHLORIDE: 107 mmol/L (ref 101–111)
CO2: 25 mmol/L (ref 22–32)
CREATININE: 0.88 mg/dL (ref 0.61–1.24)
Calcium: 9.1 mg/dL (ref 8.9–10.3)
GFR calc Af Amer: >60 mL/min (ref >60)
GFR calc non Af Amer: >60 mL/min (ref >60)
Glucose, Bld: 97 mg/dL (ref 65–99)
Potassium: 4.3 mmol/L (ref 3.5–5.1)
SODIUM: 137 mmol/L (ref 135–145)

## 2015-11-27 LAB — RETICULOCYTES
RBC.: 2.56 MIL/uL — ABNORMAL LOW (ref 4.40–5.90)
Retic Count, Absolute: 248.3 10*3/uL — ABNORMAL HIGH (ref 19.0–183.0)
Retic Ct Pct: 9.7 % — ABNORMAL HIGH (ref 0.4–3.1)

## 2015-11-27 LAB — LACTATE DEHYDROGENASE: LDH: 421 U/L — ABNORMAL HIGH (ref 98–192)

## 2015-11-27 LAB — MAGNESIUM: Magnesium: 2 mg/dL (ref 1.7–2.4)

## 2015-11-27 MED ORDER — SODIUM CHLORIDE 0.9 % IV SOLN
1000.0000 mg | INTRAVENOUS | Status: DC
  Administered 2015-11-27 – 2015-11-30 (×4): 1000 mg via INTRAVENOUS
  Filled 2015-11-27 (×4): qty 8

## 2015-11-27 MED ORDER — FOLIC ACID 1 MG PO TABS
1.0000 mg | ORAL_TABLET | Freq: Every day | ORAL | Status: DC
  Administered 2015-11-27 – 2015-11-30 (×4): 1 mg via ORAL
  Filled 2015-11-27 (×4): qty 1

## 2015-11-27 MED ORDER — PANTOPRAZOLE SODIUM 40 MG PO TBEC
40.0000 mg | DELAYED_RELEASE_TABLET | Freq: Every day | ORAL | Status: DC
  Administered 2015-11-27 – 2015-11-30 (×4): 40 mg via ORAL
  Filled 2015-11-27 (×4): qty 1

## 2015-11-27 NOTE — Progress Notes (Signed)
[name]   DOB:[birth year]   [id number]    Subjective: Patient denies any blood in stools or black stools. He does still feel short of breath on exertion. Denies any abdominal pain nausea vomiting. He does feel dizzy with standing up.  ROS: No chest pain or or cough. No fevers. No diarrhea.  Objective:  Filed Vitals:   11/27/15 0028 11/27/15 0524  BP: 105/72 126/69  Pulse: 56 66  Temp: 98.4 F (36.9 C) 98.4 F (36.9 C)  Resp: 18 17     Intake/Output Summary (Last 24 hours) at 11/27/15 1001 Last data filed at 11/27/15 0524  Gross per 24 hour  Intake   1338 ml  Output    525 ml  Net    813 ml    GENERAL:alert, no distress and comfortable; accompanied by family. SKIN: skin color, texture, turgor are normal, no rashes or significant lesions EYES: normal, Conjunctiva are pink and non-injected, sclera clear OROPHARYNX:no exudate, no erythema and lips, buccal mucosa, and tongue normal  NECK: supple, thyroid normal size, non-tender, without nodularity LYMPH:  no palpable lymphadenopathy in the cervical, axillary or inguinal LUNGS: clear to auscultation and percussion with normal breathing effort HEART: regular rate & rhythm and no murmurs and no lower extremity edema ABDOMEN:abdomen soft, non-tender and normal bowel sounds Musculoskeletal:no cyanosis of digits and no clubbing  NEURO: alert & oriented x 3 with fluent speech, no focal motor/sensory deficits   Labs:  Lab Results  Component Value Date   WBC 7.9 11/27/2015   HGB 7.9* 11/27/2015   HCT 22.4* 11/27/2015   MCV 89.3 11/27/2015   PLT 197 11/27/2015   NEUTROABS 6.8* 11/23/2015    Lab Results  Component Value Date   NA 137 11/27/2015   K 4.3 11/27/2015   CL 107 11/27/2015   CO2 25 11/27/2015    Studies:  No results found.  Assessment & Plan:   55 yo with Rheumatoid arthritis/connective tissue disorder- with a significant drop of hemoglobin from a baseline 14 to 6 at presentation/few days ago;  negative for bleed.  # Severe anemia- likely hemolytic anemia [low haptoglobin/ high LDH/ DAT- positive]. Like related to his underlying immune disorder. This has not responded well to steroids- prednisone 90mg /day [started 6/28]; today 7.1- Hb. I would recommend high-dose steroids- methylprednisolone 1 g daily; starting first dose today. Hopefully in the next 1-2 days patient should start responding. Recommend starting the patient on Protonix for GI prophylaxis. Also start the patient on folic acid. Discontinue prednisone. We'll repeat LDH and reticulocyte count.  # Also discussed the patient and family that if his hemoglobin does not improve/ or he relapses soon after steroids- other options like like rituximab etc.  # The above plan of care was discussed the patient and family in detail. Also spoke to Dr.Vachhani in detail.    Cammie Sickle, MD 11/27/2015  10:01 AM

## 2015-11-27 NOTE — Progress Notes (Signed)
[facility] at [place] NAME: [name]    MR#:  [id number]  DATE OF BIRTH:  Jan 26, 1961  SUBJECTIVE:  CHIEF COMPLAINT:   Chief Complaint  Patient presents with  . Dizziness    Came with dizziness and found to have severe anemia. Also had a drop in hemoglobin. Weight loss.    blood transfusion done as needed total 4 in this admission.   Endoscopy and colonoscopy done- no active bleed,   LDH is high and haptoglobin is low.   Transfusion on admission , 6/28 and 6/30.   Found to have hemolytic anemia, On oral steroids since 11/24/15.   REVIEW OF SYSTEMS:  CONSTITUTIONAL: No fever,positive for fatigue or weakness.  EYES: No blurred or double vision.  EARS, NOSE, AND THROAT: No tinnitus or ear pain.  RESPIRATORY: No cough, shortness of breath, wheezing or hemoptysis.  CARDIOVASCULAR: No chest pain, orthopnea, edema.  GASTROINTESTINAL: No nausea, vomiting, diarrhea or abdominal pain.  GENITOURINARY: No dysuria, hematuria.  ENDOCRINE: No polyuria, nocturia,  HEMATOLOGY: No anemia, easy bruising or bleeding SKIN: No rash or lesion. MUSCULOSKELETAL: No joint pain or arthritis.   NEUROLOGIC: No tingling, numbness, weakness.  PSYCHIATRY: No anxiety or depression.   ROS  DRUG ALLERGIES:  No Known Allergies  VITALS:  Blood pressure 126/69, pulse 66, temperature 98.4 F (36.9 C), temperature source Oral, resp. rate 17, height 5\' 10"  (1.778 m), weight 93.895 kg (207 lb), SpO2 98 %.  PHYSICAL EXAMINATION:  GENERAL:  55 y.o.-year-old patient lying in the bed with no acute distress.  EYES: Pupils equal, round, reactive to light and accommodation. No scleral icterus. Extraocular muscles intact. Conjunctiva pale. HEENT: Head atraumatic, normocephalic. Oropharynx and nasopharynx clear.  NECK:  Supple, no jugular venous distention. No thyroid enlargement, no tenderness.  LUNGS: Normal breath sounds bilaterally, no wheezing, rales,rhonchi or  crepitation. No use of accessory muscles of respiration.  CARDIOVASCULAR: S1, S2 normal. No murmurs, rubs, or gallops.  ABDOMEN: Soft, nontender, nondistended. Bowel sounds present. No organomegaly or mass.  EXTREMITIES: No pedal edema, cyanosis, or clubbing.  NEUROLOGIC: Cranial nerves II through XII are intact. Muscle strength 5/5 in all extremities. Sensation intact. Gait not checked.  PSYCHIATRIC: The patient is alert and oriented x 3.  SKIN: No obvious rash, lesion, or ulcer.   Physical Exam LABORATORY PANEL:   CBC  Recent Labs Lab 11/27/15 0509  WBC 7.9  HGB 7.9*  HCT 22.4*  PLT 197   ------------------------------------------------------------------------------------------------------------------  Chemistries   Recent Labs Lab 11/25/15 0430 11/27/15 0509  NA 136 137  K 4.7 4.3  CL 107 107  CO2 25 25  GLUCOSE 127* 97  BUN 25* 25*  CREATININE 0.94 0.88  CALCIUM 8.9 9.1  MG 2.3 2.0  AST 40  --   ALT 33  --   ALKPHOS 53  --   BILITOT 2.4*  --    ------------------------------------------------------------------------------------------------------------------  Cardiac Enzymes No results for input(s): TROPONINI in the last 168 hours. ------------------------------------------------------------------------------------------------------------------  RADIOLOGY:  No results found.  ASSESSMENT AND PLAN:   Principal Problem:   Hemorrhagic shock Active Problems:   Anemia   Iron deficiency anemia, unspecified   Hernia, hiatal   Acute esophagitis   Hemolytic anemia (HCC)   Arrhythmia   Absolute anemia   Weakness   1. Severe symptomatic anemia with falls. Acute drop in hemoglobin from 14 down to 5.5. Immune mediated hemolytic anemia  Received 2 units of transfusion by ER. Hemoglobin improved and dropped  again., Given one more unit PRBC on 11/24/15   Iron studies shows no Iron deficiency. MCV is normal.   LDH is high and haptoglobin is low suggestive of  hemolysis.   As patient never had GI workup GI had done endoscopy and colonoscopy - no source for bleeding.   hematology consult for hemolysis.   Hold anti-coagulation for now.   Appreciated hematology consult- ordered autoimmune work ups.    Started on oral steroids- 11/24/15    Inspite of 3 days with oral steroids, still Hb keep dropping- Called Dr.Finnegan on 6/30, discussed with him- for follow up.    He may need Iv IG.    Do we resume xarelto? When hematology permits.  2. History of atrial flutter.    Have some tachycardia, call cardio consult.  Stop Xarelto. Case discussed with Dr. Rockey Situ cardiology. On low-dose Cardizem   Rate is reasonable for anemia, monitoring for now.  3. Pulmonary fibrosis. Continue inhalers 4. Rheumatoid arthritis 5. Hypokalemia replace potassium and stop Microzide 6. Hypotension   Given bolus IV fluid and he responded, .   Check infection work up- negative so far, stable now.  All the records are reviewed and case discussed with Care Management/Social Workerr. Management plans discussed with the patient, family and they are in agreement.  CODE STATUS: full/  TOTAL TIME TAKING CARE OF THIS PATIENT: 35  minutes.   Discussed with his wife and wife's good friend in room- with permission from pt. POSSIBLE D/C IN 1-2 DAYS, DEPENDING ON hematologist opinion due to hemolytic anemia  Vaughan Basta M.D on 11/27/2015   Between 7am to 6pm - Pager - [phone]  After 6pm go to [email or web address] - password [email or web address] [facility]  Office  [phone]  CC: Primary care physician; Otilio Miu, MD  Note: This dictation was prepared with Dragon dictation along with smaller phrase technology. Any transcriptional errors that result from this process are unintentional.

## 2015-11-27 NOTE — Progress Notes (Signed)
Pharmacy Consult for Electrolyte Monitoring and Replacement Indication: Hypokalemia  No Known Allergies  Patient Measurements: Height: 5\' 10"  (177.8 cm) Weight: 207 lb (93.895 kg) IBW/kg (Calculated) : 73  Vital Signs: Temp: 98.4 F (36.9 C) (07/01 0524) Temp Source: Oral (07/01 0524) BP: 126/69 mmHg (07/01 0524) Pulse Rate: 66 (07/01 0524) Intake/Output from previous day: 06/30 0701 - 07/01 0700 In: 1578 [P.O.:1200; I.V.:100; Blood:278] Out: 525 [Urine:525] Intake/Output from this shift:    Labs:  Recent Labs  11/25/15 0430 11/26/15 0344 11/27/15 0509  WBC 8.6 8.7 7.9  HGB 7.0* 6.6* 7.9*  HCT 19.4* 18.7* 22.4*  PLT 199 192 197     Recent Labs  11/24/15 0920 11/24/15 1522 11/25/15 0430 11/27/15 0509  NA  --   --  136 137  K  --   --  4.7 4.3  CL  --   --  107 107  CO2  --   --  25 25  GLUCOSE  --   --  127* 97  BUN  --   --  25* 25*  CREATININE  --   --  0.94 0.88  CALCIUM  --   --  8.9 9.1  MG 1.6*  --  2.3 2.0  PHOS  --   --  3.5  --   PROT  --   --  7.5  --   ALBUMIN  --   --  3.3*  --   AST  --   --  40  --   ALT  --   --  33  --   ALKPHOS  --   --  53  --   BILITOT  --   --  2.4*  --   BILIDIR  --  0.7*  --   --    Estimated Creatinine Clearance: 107.9 mL/min (by C-G formula based on Cr of 0.88).   No results for input(s): GLUCAP in the last 72 hours.  Medical History: Past Medical History  Diagnosis Date  . Atrial flutter ([place])     a. on Xarelto; b. s/p successful TEE/DCCV on 08/28/2014  . Pulmonary fibrosis ([place])     a. not on home oxygen  . RA (rheumatoid arthritis) ([place])   . HTN (hypertension)   . 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 echo 07/2014: EF 20-25%, cannot exclude atrial flutter,  global HK, possible bicuspid Ao valve, mod reduced RV sys fxn, mild-mod aortic valve sclerosis/calcification, mod TR, mildly elevated RVSP; b. TEE 08/28/2014: EF 30-35%, no intracardic thrombus, mildly dilated LA/RA, mild TR, mild-mod aortic  sclerosis w/o stenosis; c. echo 09/2014: EF 55-60%, select images w/ inf HK  . History of nuclear stress test     a. 09/02/2014: no sig ischemia, GI uptake noted, no sig WMA, EF 48%, no EKG chanes concerning for ischemia, low risk scan    . Normal coronary arteries     a. cardiac cath 12/03/2014: no sigificant CAD, right dominant system, LVEF >55%, no MR or AS  . Gangrene of finger (HCC)     Medications:  Scheduled:  . diltiazem  30 mg Oral QID  . fluticasone  2 spray Each Nare Daily  . metoprolol  75 mg Oral BID  . predniSONE  90 mg Oral Q breakfast  . sodium chloride flush  3 mL Intravenous Q12H   Infusions:     Assessment: Pharmacy consulted to assist in managing electrolytes in this 55 y/o M with anemia s/p transfusion.  Plan:  K=4.3, Mag=2.0 Will recheck electrolytes with am labs.   Pharmacy will continue to monitor and adjust per consult.   Chinita Greenland A 11/27/2015,8:48 AM

## 2015-11-28 LAB — CBC
HEMATOCRIT: 22.4 % — AB (ref 40.0–52.0)
HEMOGLOBIN: 7.9 g/dL — AB (ref 13.0–18.0)
MCH: 30.8 pg (ref 26.0–34.0)
MCHC: 35 g/dL (ref 32.0–36.0)
MCV: 88 fL (ref 80.0–100.0)
Platelets: 199 10*3/uL (ref 150–440)
RBC: 2.55 MIL/uL — ABNORMAL LOW (ref 4.40–5.90)
RDW: 15.7 % — ABNORMAL HIGH (ref 11.5–14.5)
WBC: 7.5 10*3/uL (ref 3.8–10.6)

## 2015-11-28 LAB — COMPREHENSIVE METABOLIC PANEL
ALBUMIN: 3.4 g/dL — AB (ref 3.5–5.0)
ALK PHOS: 56 U/L (ref 38–126)
ALT: 26 U/L (ref 17–63)
AST: 25 U/L (ref 15–41)
Anion gap: 6 (ref 5–15)
BUN: 29 mg/dL — AB (ref 6–20)
CALCIUM: 9.1 mg/dL (ref 8.9–10.3)
CO2: 23 mmol/L (ref 22–32)
CREATININE: 0.99 mg/dL (ref 0.61–1.24)
Chloride: 107 mmol/L (ref 101–111)
GFR calc non Af Amer: >60 mL/min (ref >60)
GLUCOSE: 134 mg/dL — AB (ref 65–99)
Potassium: 4.6 mmol/L (ref 3.5–5.1)
SODIUM: 136 mmol/L (ref 135–145)
Total Bilirubin: 1.5 mg/dL — ABNORMAL HIGH (ref 0.3–1.2)
Total Protein: 7.3 g/dL (ref 6.5–8.1)

## 2015-11-28 LAB — MAGNESIUM: Magnesium: 1.9 mg/dL (ref 1.7–2.4)

## 2015-11-28 NOTE — Progress Notes (Signed)
Pharmacy Consult for Electrolyte Monitoring and Replacement  Indication: Hypokalemia  No Known Allergies  Patient Measurements: Height: 5\' 10"  (177.8 cm) Weight: 207 lb (93.895 kg) IBW/kg (Calculated) : 73  Vital Signs: Temp: 98.2 F (36.8 C) (07/02 0440) Temp Source: Oral (07/02 0440) BP: 130/73 mmHg (07/02 0923) Pulse Rate: 66 (07/02 0923) Intake/Output from previous day: 07/01 0701 - 07/02 0700 In: 898 [P.O.:840; IV Piggyback:58] Out: 1575 [Urine:1575] Intake/Output from this shift:    Labs:  Recent Labs  11/26/15 0344 11/27/15 0509 11/28/15 0406  WBC 8.7 7.9 7.5  HGB 6.6* 7.9* 7.9*  HCT 18.7* 22.4* 22.4*  PLT 192 197 199     Recent Labs  11/27/15 0509 11/28/15 0406  NA 137 136  K 4.3 4.6  CL 107 107  CO2 25 23  GLUCOSE 97 134*  BUN 25* 29*  CREATININE 0.88 0.99  CALCIUM 9.1 9.1  MG 2.0 1.9  PROT  --  7.3  ALBUMIN  --  3.4*  AST  --  25  ALT  --  26  ALKPHOS  --  56  BILITOT  --  1.5*   Estimated Creatinine Clearance: 95.9 mL/min (by C-G formula based on Cr of 0.99).   No results for input(s): GLUCAP in the last 72 hours.  Medical History: Past Medical History  Diagnosis Date  . Atrial flutter ([place])     a. on Xarelto; b. s/p successful TEE/DCCV on 08/28/2014  . Pulmonary fibrosis ([place])     a. not on home oxygen  . RA (rheumatoid arthritis) ([place])   . HTN (hypertension)   . 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 echo 07/2014: EF 20-25%, cannot exclude atrial flutter,  global HK, possible bicuspid Ao valve, mod reduced RV sys fxn, mild-mod aortic valve sclerosis/calcification, mod TR, mildly elevated RVSP; b. TEE 08/28/2014: EF 30-35%, no intracardic thrombus, mildly dilated LA/RA, mild TR, mild-mod aortic sclerosis w/o stenosis; c. echo 09/2014: EF 55-60%, select images w/ inf HK  . History of nuclear stress test     a. 09/02/2014: no sig ischemia, GI uptake noted, no sig WMA, EF 48%, no EKG chanes concerning for ischemia, low risk scan     . Normal coronary arteries     a. cardiac cath 12/03/2014: no sigificant CAD, right dominant system, LVEF >55%, no MR or AS  . Gangrene of finger (HCC)     Medications:  Scheduled:  . diltiazem  30 mg Oral QID  . fluticasone  2 spray Each Nare Daily  . folic acid  1 mg Oral Daily  . methylPREDNISolone (SOLU-MEDROL) injection  1,000 mg Intravenous Q24H  . metoprolol  75 mg Oral BID  . pantoprazole  40 mg Oral Daily  . sodium chloride flush  3 mL Intravenous Q12H   Infusions:     Assessment: Pharmacy consulted to assist in managing electrolytes in this 55 y/o M with anemia s/p transfusion.   Plan:  K=4.3, Mag=2.0 Will recheck electrolytes with am labs.   7/2- K=4.6, Mag=1.9. No supplementation required. F/u electrolytes with am labs.  Pharmacy will continue to monitor and adjust per consult.   Chinita Greenland A 11/28/2015,9:49 AM

## 2015-11-28 NOTE — Progress Notes (Signed)
1300: Patient up - ambulating in hallway - around nurses station x2. Slight shortness of breath, but otherwise no problems. Was patient's first time up.

## 2015-11-28 NOTE — Progress Notes (Signed)
[facility] at [place] NAME: [name]    MR#:  [id number]  DATE OF BIRTH:  06-25-1960  SUBJECTIVE:  CHIEF COMPLAINT:   Chief Complaint  Patient presents with  . Dizziness    Came with dizziness and found to have severe anemia. Also had a drop in hemoglobin. Weight loss.    blood transfusion done as needed total 4 in this admission.   Endoscopy and colonoscopy done- no active bleed,   LDH is high and haptoglobin is low.   Transfusion on admission , 6/28 and 6/30.   Found to have hemolytic anemia, On oral steroids since 11/24/15.   Started IV steroids on 11/27/15- hb stable today.   REVIEW OF SYSTEMS:  CONSTITUTIONAL: No fever,positive for fatigue or weakness.  EYES: No blurred or double vision.  EARS, NOSE, AND THROAT: No tinnitus or ear pain.  RESPIRATORY: No cough, shortness of breath, wheezing or hemoptysis.  CARDIOVASCULAR: No chest pain, orthopnea, edema.  GASTROINTESTINAL: No nausea, vomiting, diarrhea or abdominal pain.  GENITOURINARY: No dysuria, hematuria.  ENDOCRINE: No polyuria, nocturia,  HEMATOLOGY: No anemia, easy bruising or bleeding SKIN: No rash or lesion. MUSCULOSKELETAL: No joint pain or arthritis.   NEUROLOGIC: No tingling, numbness, weakness.  PSYCHIATRY: No anxiety or depression.   ROS  DRUG ALLERGIES:  No Known Allergies  VITALS:  Blood pressure 131/70, pulse 75, temperature 98.2 F (36.8 C), temperature source Oral, resp. rate 18, height 5\' 10"  (1.778 m), weight 93.895 kg (207 lb), SpO2 98 %.  PHYSICAL EXAMINATION:  GENERAL:  55 y.o.-year-old patient lying in the bed with no acute distress.  EYES: Pupils equal, round, reactive to light and accommodation. No scleral icterus. Extraocular muscles intact. Conjunctiva pale. HEENT: Head atraumatic, normocephalic. Oropharynx and nasopharynx clear.  NECK:  Supple, no jugular venous distention. No thyroid enlargement, no tenderness.  LUNGS: Normal breath  sounds bilaterally, no wheezing, rales,rhonchi or crepitation. No use of accessory muscles of respiration.  CARDIOVASCULAR: S1, S2 normal. No murmurs, rubs, or gallops.  ABDOMEN: Soft, nontender, nondistended. Bowel sounds present. No organomegaly or mass.  EXTREMITIES: No pedal edema, cyanosis, or clubbing.  NEUROLOGIC: Cranial nerves II through XII are intact. Muscle strength 5/5 in all extremities. Sensation intact. Gait not checked.  PSYCHIATRIC: The patient is alert and oriented x 3.  SKIN: No obvious rash, lesion, or ulcer.   Physical Exam LABORATORY PANEL:   CBC  Recent Labs Lab 11/28/15 0406  WBC 7.5  HGB 7.9*  HCT 22.4*  PLT 199   ------------------------------------------------------------------------------------------------------------------  Chemistries   Recent Labs Lab 11/28/15 0406  NA 136  K 4.6  CL 107  CO2 23  GLUCOSE 134*  BUN 29*  CREATININE 0.99  CALCIUM 9.1  MG 1.9  AST 25  ALT 26  ALKPHOS 56  BILITOT 1.5*   ------------------------------------------------------------------------------------------------------------------  Cardiac Enzymes No results for input(s): TROPONINI in the last 168 hours. ------------------------------------------------------------------------------------------------------------------  RADIOLOGY:  No results found.  ASSESSMENT AND PLAN:   Principal Problem:   Hemorrhagic shock Active Problems:   Anemia   Iron deficiency anemia, unspecified   Hernia, hiatal   Acute esophagitis   Hemolytic anemia (HCC)   Arrhythmia   Absolute anemia   Weakness   1. Severe symptomatic anemia with falls. Acute drop in hemoglobin from 14 down to 5.5. Immune mediated hemolytic anemia  Received 2 units of transfusion by ER. Hemoglobin improved and dropped again., Given one more unit PRBC on 11/24/15   Iron studies shows  no Iron deficiency. MCV is normal.   LDH is high and haptoglobin is low suggestive of hemolysis.   As patient  never had GI workup GI had done endoscopy and colonoscopy - no source for bleeding.   hematology consult for hemolysis.   Hold anti-coagulation for now.   Appreciated hematology consult- ordered autoimmune work ups.    Started on oral steroids- 11/24/15    Inspite of 3 days with oral steroids, still Hb keep dropping- Called Dr.Finnegan on 6/30, discussed with him- for follow up.      Started IV steroid high dose 11/27/15- Hb stable. Monitor today for further improvement.  2. History of atrial flutter.    Have some tachycardia, call cardio consult.  Stop Xarelto. Case discussed with Dr. Rockey Situ cardiology. On low-dose Cardizem   Rate is reasonable for anemia, monitoring for now.  3. Pulmonary fibrosis. Continue inhalers 4. Rheumatoid arthritis 5. Hypokalemia replace potassium and stop Microzide 6. Hypotension   Given bolus IV fluid and he responded, .   Check infection work up- negative so far, stable now. 7. hyperbilirubenia   Due to hemolysis, stable now.  All the records are reviewed and case discussed with Care Management/Social Workerr. Management plans discussed with the patient, family and they are in agreement.  CODE STATUS: full/  TOTAL TIME TAKING CARE OF THIS PATIENT: 35  minutes.   Discussed with his wife and wife's good friend in room- with permission from pt. POSSIBLE D/C IN 1-2 DAYS, DEPENDING ON hematologist opinion due to hemolytic anemia  Vaughan Basta M.D on 11/28/2015   Between 7am to 6pm - Pager - [phone]  After 6pm go to [email or web address] - password [email or web address] [facility]  Office  [phone]  CC: Primary care physician; Otilio Miu, MD  Note: This dictation was prepared with Dragon dictation along with smaller phrase technology. Any transcriptional errors that result from this process are unintentional.

## 2015-11-28 NOTE — Progress Notes (Signed)
[name]   DOB:Feb 26, 1961   [id number]    Subjective: Patient received IV methylprednisolone 1000 milligram yesterday. Today he feels short of breath left dizzy;  has been able to walk to the bathroom. Patient denies any blood in stools or black stools. No abdominal pain nausea vomiting. He is able to sleep well last night  ROS: No chest pain or or cough. No fevers. No diarrhea.  Objective:  Filed Vitals:   11/28/15 0440 11/28/15 0923  BP: 108/70 130/73  Pulse: 56 66  Temp: 98.2 F (36.8 C)   Resp: 18      Intake/Output Summary (Last 24 hours) at 11/28/15 1408 Last data filed at 11/28/15 0900  Gross per 24 hour  Intake    600 ml  Output    975 ml  Net   -375 ml    GENERAL:alert, no distress and comfortable; accompanied by family. SKIN: skin color, texture, turgor are normal, no rashes or significant lesions EYES: normal, Conjunctiva are pink and non-injected, sclera clear OROPHARYNX:no exudate, no erythema and lips, buccal mucosa, and tongue normal  NECK: supple, thyroid normal size, non-tender, without nodularity LYMPH:  no palpable lymphadenopathy in the cervical, axillary or inguinal LUNGS: clear to auscultation and percussion with normal breathing effort HEART: regular rate & rhythm and no murmurs and no lower extremity edema ABDOMEN:abdomen soft, non-tender and normal bowel sounds Musculoskeletal:no cyanosis of digits and no clubbing  NEURO: alert & oriented x 3 with fluent speech, no focal motor/sensory deficits   Labs:  Lab Results  Component Value Date   WBC 7.5 11/28/2015   HGB 7.9* 11/28/2015   HCT 22.4* 11/28/2015   MCV 88.0 11/28/2015   PLT 199 11/28/2015   NEUTROABS 6.8* 11/23/2015    Lab Results  Component Value Date   NA 136 11/28/2015   K 4.6 11/28/2015   CL 107 11/28/2015   CO2 23 11/28/2015    Studies:  No results found.  Assessment & Plan:   55 yo with Rheumatoid arthritis/connective tissue disorder- with a significant  drop of hemoglobin from a baseline 14 to 6 at presentation/few days ago; negative for bleed.  # Severe anemia- likely hemolytic anemia [low haptoglobin/ high LDH/ DAT- positive]- likely secondary to underlying disorder. Patient's hemoglobin is 7.9/LDH is improving- previously 538 yesterday 421. I think patient is responding to the steroids; I would continue 1 more dose of methylprednisolone 1 g tomorrow [total of 3 doses]. And if his hemoglobin stays stable/continues to improve- it could be discharged home on oral prednisone 90 mg a day- plan to taper steroids slowly over the next few weeks. Patient will need CBC done in  4 days out patient.   # Also discussed the patient and family that if his hemoglobin does not improve/ or he relapses soon after steroids- other options like like rituximab etc.  # The above plan of care was discussed the patient and family in detail. Also spoke to Dr.Vachhani in detail.    Cammie Sickle, MD 11/28/2015  2:08 PM

## 2015-11-28 NOTE — Progress Notes (Signed)
Note is complete.  Dr.Brahmanday

## 2015-11-29 LAB — COMP PANEL: LEUKEMIA/LYMPHOMA

## 2015-11-29 LAB — BASIC METABOLIC PANEL
Anion gap: 5 (ref 5–15)
BUN: 31 mg/dL — AB (ref 6–20)
CALCIUM: 9.1 mg/dL (ref 8.9–10.3)
CO2: 21 mmol/L — ABNORMAL LOW (ref 22–32)
Chloride: 111 mmol/L (ref 101–111)
Creatinine, Ser: 0.95 mg/dL (ref 0.61–1.24)
GFR calc Af Amer: >60 mL/min (ref >60)
GLUCOSE: 130 mg/dL — AB (ref 65–99)
Potassium: 4.2 mmol/L (ref 3.5–5.1)
SODIUM: 137 mmol/L (ref 135–145)

## 2015-11-29 LAB — CBC
HCT: 22 % — ABNORMAL LOW (ref 40.0–52.0)
HEMOGLOBIN: 7.6 g/dL — AB (ref 13.0–18.0)
MCH: 31.1 pg (ref 26.0–34.0)
MCHC: 34.6 g/dL (ref 32.0–36.0)
MCV: 89.8 fL (ref 80.0–100.0)
PLATELETS: 198 10*3/uL (ref 150–440)
RBC: 2.45 MIL/uL — AB (ref 4.40–5.90)
RDW: 16.2 % — AB (ref 11.5–14.5)
WBC: 10.4 10*3/uL (ref 3.8–10.6)

## 2015-11-29 LAB — PREPARE RBC (CROSSMATCH)

## 2015-11-29 LAB — CULTURE, BLOOD (ROUTINE X 2)
CULTURE: NO GROWTH
Culture: NO GROWTH

## 2015-11-29 LAB — MAGNESIUM: MAGNESIUM: 2 mg/dL (ref 1.7–2.4)

## 2015-11-29 LAB — LACTATE DEHYDROGENASE: LDH: 346 U/L — ABNORMAL HIGH (ref 98–192)

## 2015-11-29 MED ORDER — SODIUM CHLORIDE 0.9 % IV SOLN
250.0000 mL | Freq: Once | INTRAVENOUS | Status: AC
  Administered 2015-11-29: 09:00:00 250 mL via INTRAVENOUS

## 2015-11-29 MED ORDER — DIPHENHYDRAMINE HCL 25 MG PO CAPS
25.0000 mg | ORAL_CAPSULE | Freq: Once | ORAL | Status: AC
  Administered 2015-11-29: 25 mg via ORAL
  Filled 2015-11-29: qty 1

## 2015-11-29 MED ORDER — SODIUM CHLORIDE 0.9% FLUSH: 3.0000 mL | INTRAVENOUS | Status: DC | PRN

## 2015-11-29 MED ORDER — FUROSEMIDE 10 MG/ML IJ SOLN
20.0000 mg | Freq: Once | INTRAMUSCULAR | Status: AC
Start: 2015-11-29 — End: 2015-11-29
  Administered 2015-11-29: 09:00:00 20 mg via INTRAVENOUS
  Filled 2015-11-29: qty 2

## 2015-11-29 MED ORDER — ACETAMINOPHEN 325 MG PO TABS
650.0000 mg | ORAL_TABLET | Freq: Once | ORAL | Status: AC
  Administered 2015-11-29: 09:00:00 650 mg via ORAL
  Filled 2015-11-29: qty 2

## 2015-11-29 MED ORDER — HEPARIN SOD (PORK) LOCK FLUSH 100 UNIT/ML IV SOLN: 500.0000 [IU] | Freq: Every day | INTRAVENOUS | Status: DC | PRN

## 2015-11-29 MED ORDER — SODIUM CHLORIDE 0.9% FLUSH: 10.0000 mL | INTRAVENOUS | Status: DC | PRN

## 2015-11-29 MED ORDER — HEPARIN SOD (PORK) LOCK FLUSH 100 UNIT/ML IV SOLN: 250.0000 [IU] | INTRAVENOUS | Status: DC | PRN

## 2015-11-29 NOTE — Care Management Important Message (Signed)
Important Message  Patient Details  Name: [name] MRN: [id number] Date of Birth: 08-Nov-1960   Medicare Important Message Given:  Yes    [name], RN 11/29/2015, 7:18 AM

## 2015-11-29 NOTE — Care Management Note (Signed)
Case Management Note  Patient Details  Name: Dedan Jacobowitz MRN: [id number] Date of Birth: 1960-11-14  Subjective/Objective:       55 yo Mr Dewaine Hoeppner was admitted 11/22/15 with hemolytic anemia. Xaralto on hold. Resides at home with wife who is at bedside and is very supportive. PCP=Deanna Jones. Pharmacy=[facility]. No home oxygen, no home health services, and no home assistive equipment. When asked about any home equipment needs, Mr Emanuelson denied any needs. Mr Coyt drives himself but if he is unable to drive his wife will transport him. Do not anticipate any home health needs. Case management will follow for discharge planning.              Action/Plan:   Expected Discharge Date:                  Expected Discharge Plan:     In-House Referral:     Discharge planning Services     Post Acute Care Choice:    Choice offered to:     DME Arranged:    DME Agency:     HH Arranged:    HH Agency:     Status of Service:     If discussed at [employer] of Stay Meetings, dates discussed:    Additional Comments:  Ruperto Kiernan A, RN 11/29/2015, 3:56 PM

## 2015-11-29 NOTE — Progress Notes (Signed)
[facility] NAME: [name]    MR#:  [id number]  DATE OF BIRTH:  1960-08-08  SUBJECTIVE:  CHIEF COMPLAINT:   Chief Complaint  Patient presents with  . Dizziness    Came with dizziness and found to have severe anemia. Also had a drop in hemoglobin. Weight loss.    blood transfusion done as needed total 4 in this admission.   Endoscopy and colonoscopy done- no active bleed,   LDH is high and haptoglobin is low.   Transfusion on admission , 6/28 and 6/30.   Found to have hemolytic anemia, On oral steroids since 11/24/15.   Started IV steroids on 11/27/15-    Slight drop in Hb so ordered one more uni of PRBC transfusion on 11/29/15.   REVIEW OF SYSTEMS:  CONSTITUTIONAL: No fever,positive for fatigue or weakness.  EYES: No blurred or double vision.  EARS, NOSE, AND THROAT: No tinnitus or ear pain.  RESPIRATORY: No cough, shortness of breath, wheezing or hemoptysis.  CARDIOVASCULAR: No chest pain, orthopnea, edema.  GASTROINTESTINAL: No nausea, vomiting, diarrhea or abdominal pain.  GENITOURINARY: No dysuria, hematuria.  ENDOCRINE: No polyuria, nocturia,  HEMATOLOGY: No anemia, easy bruising or bleeding SKIN: No rash or lesion. MUSCULOSKELETAL: No joint pain or arthritis.   NEUROLOGIC: No tingling, numbness, weakness.  PSYCHIATRY: No anxiety or depression.   ROS  DRUG ALLERGIES:  No Known Allergies  VITALS:  Blood pressure 121/59, pulse 41, temperature 97.8 F (36.6 C), temperature source Oral, resp. rate 18, height 5\' 10"  (1.778 m), weight 93.895 kg (207 lb), SpO2 100 %.  PHYSICAL EXAMINATION:  GENERAL:  55 y.o.-year-old patient lying in the bed with no acute distress.  EYES: Pupils equal, round, reactive to light and accommodation. No scleral icterus. Extraocular muscles intact. Conjunctiva pale. HEENT: Head atraumatic, normocephalic. Oropharynx and nasopharynx clear.  NECK:  Supple, no jugular venous distention. No  thyroid enlargement, no tenderness.  LUNGS: Normal breath sounds bilaterally, no wheezing, rales,rhonchi or crepitation. No use of accessory muscles of respiration.  CARDIOVASCULAR: S1, S2 normal. No murmurs, rubs, or gallops.  ABDOMEN: Soft, nontender, nondistended. Bowel sounds present. No organomegaly or mass.  EXTREMITIES: No pedal edema, cyanosis, or clubbing.  NEUROLOGIC: Cranial nerves II through XII are intact. Muscle strength 5/5 in all extremities. Sensation intact. Gait not checked.  PSYCHIATRIC: The patient is alert and oriented x 3.  SKIN: No obvious rash, lesion, or ulcer.   Physical Exam LABORATORY PANEL:   CBC  Recent Labs Lab 11/29/15 0519  WBC 10.4  HGB 7.6*  HCT 22.0*  PLT 198   ------------------------------------------------------------------------------------------------------------------  Chemistries   Recent Labs Lab 11/28/15 0406 11/29/15 0519  NA 136 137  K 4.6 4.2  CL 107 111  CO2 23 21*  GLUCOSE 134* 130*  BUN 29* 31*  CREATININE 0.99 0.95  CALCIUM 9.1 9.1  MG 1.9 2.0  AST 25  --   ALT 26  --   ALKPHOS 56  --   BILITOT 1.5*  --    ------------------------------------------------------------------------------------------------------------------  Cardiac Enzymes No results for input(s): TROPONINI in the last 168 hours. ------------------------------------------------------------------------------------------------------------------  RADIOLOGY:  No results found.  ASSESSMENT AND PLAN:   Principal Problem:   Hemorrhagic shock Active Problems:   Anemia   Iron deficiency anemia, unspecified   Hernia, hiatal   Acute esophagitis   Hemolytic anemia (HCC)   Arrhythmia   Absolute anemia   Weakness   1. Severe symptomatic anemia with falls. Acute  drop in hemoglobin from 14 down to 5.5. Immune mediated hemolytic anemia  Received 2 units of transfusion by ER. Hemoglobin improved and dropped again., Given one more unit PRBC on  11/24/15   Iron studies shows no Iron deficiency. MCV is normal.   LDH is high and haptoglobin is low suggestive of hemolysis.   As patient never had GI workup GI had done endoscopy and colonoscopy - no source for bleeding.   hematology consult for hemolysis.   Hold anti-coagulation for now.   Appreciated hematology consult- ordered autoimmune work ups.    Started on oral steroids- 11/24/15    Inspite of 3 days with oral steroids, still Hb keep dropping-     Started IV steroid high dose 11/27/15-     Slight drop in Hb again on 11/29/15 from 7.9 to 7.6- transfuse one more unit PRBC as per hematology today.  2. History of atrial flutter.    Have some tachycardia, call cardio consult.   Stop Xarelto. Case discussed with Dr. Rockey Situ cardiology. On low-dose Cardizem   Rate is reasonable for anemia, monitoring for now.  3. Pulmonary fibrosis. Continue inhalers 4. Rheumatoid arthritis 5. Hypokalemia replace potassium and stop Microzide- normal now. 6. Hypotension   Given bolus IV fluid and he responded, .   Check infection work up- negative so far, stable now. 7. hyperbilirubenia   Due to hemolysis, stable now.  All the records are reviewed and case discussed with Care Management/Social Workerr. Management plans discussed with the patient, family and they are in agreement.  CODE STATUS: full  TOTAL TIME TAKING CARE OF THIS PATIENT: 35  minutes.   Discussed with his wife  POSSIBLE D/C IN 1-2 DAYS, DEPENDING ON hematologist opinion due to hemolytic anemia  receiving blood transfusion today again.  Vaughan Basta M.D on 11/29/2015   Between 7am to 6pm - Pager - [phone]  After 6pm go to [email or web address] - password [email or web address] [facility]  Office  [phone]  CC: Primary care physician; Otilio Miu, MD  Note: This dictation was prepared with Dragon dictation along with smaller phrase technology. Any transcriptional errors that result from this process are  unintentional.

## 2015-11-29 NOTE — Progress Notes (Signed)
[name]   DOB:12-19-1960   [id number]    Subjective: Patient received IV methylprednisolone 1000 milligram x 2 doses so far. Patient has a good appetite. Denies any difficulty sleeping. He has been up and about in the hallways. Denies any epigastric discomfort.   ROS: No chest pain or or cough. No fevers. No diarrhea.  Objective:  Filed Vitals:   11/29/15 1344 11/29/15 1605  BP: 121/59 124/64  Pulse: 41 62  Temp: 97.8 F (36.6 C) 98.1 F (36.7 C)  Resp: 18 18     Intake/Output Summary (Last 24 hours) at 11/29/15 1830 Last data filed at 11/29/15 1700  Gross per 24 hour  Intake   1630 ml  Output   1625 ml  Net      5 ml    GENERAL:alert, no distress and comfortable; accompanied by family. SKIN: skin color, texture, turgor are normal, no rashes or significant lesions EYES: normal, Conjunctiva are pink and non-injected, sclera clear OROPHARYNX:no exudate, no erythema and lips, buccal mucosa, and tongue normal  NECK: supple, thyroid normal size, non-tender, without nodularity LYMPH:  no palpable lymphadenopathy in the cervical, axillary or inguinal LUNGS: clear to auscultation and percussion with normal breathing effort HEART: regular rate & rhythm and no murmurs and no lower extremity edema ABDOMEN:abdomen soft, non-tender and normal bowel sounds Musculoskeletal:no cyanosis of digits and no clubbing  NEURO: alert & oriented x 3 with fluent speech, no focal motor/sensory deficits   Labs:  Lab Results  Component Value Date   WBC 10.4 11/29/2015   HGB 7.6* 11/29/2015   HCT 22.0* 11/29/2015   MCV 89.8 11/29/2015   PLT 198 11/29/2015   NEUTROABS 6.8* 11/23/2015    Lab Results  Component Value Date   NA 137 11/29/2015   K 4.2 11/29/2015   CL 111 11/29/2015   CO2 21* 11/29/2015    Studies:  No results found.  Assessment & Plan:   55 yo with Rheumatoid arthritis/connective tissue disorder- with a significant drop of hemoglobin from a baseline 14 to 6  at presentation/few days ago; negative for bleed.  # Severe anemia- likely hemolytic anemia [low haptoglobin/ high LDH/ DAT- positive]- likely secondary to underlying disorder. Patient's hemoglobin has not significantly improved- 7.6 today. However his LDH is trending down; so suspect his hemoglobin should start coming up within the next few days. Recommend IV methylprednisolone thousand milligrams total of 3 doses; last treatment today; and then he could be discharged home on prednisone 90 mg once a day.   We will plan for patient's CBC for July 6th; # Also discussed the patient and family that if his hemoglobin does not improve/ or he relapses soon after steroids- other options like like rituximab etc.  # The above plan of care was discussed the patient and family in detail. Also spoke to Dr.Vachhani in detail.    Cammie Sickle, MD 11/29/2015  6:30 PM

## 2015-11-29 NOTE — Progress Notes (Signed)
Pharmacy Consult for Electrolyte Monitoring and Replacement  Indication: Hypokalemia  No Known Allergies  Patient Measurements: Height: 5\' 10"  (177.8 cm) Weight: 207 lb (93.895 kg) IBW/kg (Calculated) : 73  Vital Signs: Temp: 98.3 F (36.8 C) (07/03 0500) Temp Source: Oral (07/03 0500) BP: 126/71 mmHg (07/03 0925) Pulse Rate: 62 (07/03 0925) Intake/Output from previous day: 07/02 0701 - 07/03 0700 In: 480 [P.O.:480] Out: 900 [Urine:900] Intake/Output from this shift:    Labs:  Recent Labs  11/27/15 0509 11/28/15 0406 11/29/15 0519  WBC 7.9 7.5 10.4  HGB 7.9* 7.9* 7.6*  HCT 22.4* 22.4* 22.0*  PLT 197 199 198     Recent Labs  11/27/15 0509 11/28/15 0406 11/29/15 0519  NA 137 136 137  K 4.3 4.6 4.2  CL 107 107 111  CO2 25 23 21*  GLUCOSE 97 134* 130*  BUN 25* 29* 31*  CREATININE 0.88 0.99 0.95  CALCIUM 9.1 9.1 9.1  MG 2.0 1.9 2.0  PROT  --  7.3  --   ALBUMIN  --  3.4*  --   AST  --  25  --   ALT  --  26  --   ALKPHOS  --  56  --   BILITOT  --  1.5*  --    Estimated Creatinine Clearance: 100 mL/min (by C-G formula based on Cr of 0.95).   No results for input(s): GLUCAP in the last 72 hours.  Medical History: Past Medical History  Diagnosis Date  . Atrial flutter ([place])     a. on Xarelto; b. s/p successful TEE/DCCV on 08/28/2014  . Pulmonary fibrosis ([place])     a. not on home oxygen  . RA (rheumatoid arthritis) ([place])   . HTN (hypertension)   . 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 echo 07/2014: EF 20-25%, cannot exclude atrial flutter,  global HK, possible bicuspid Ao valve, mod reduced RV sys fxn, mild-mod aortic valve sclerosis/calcification, mod TR, mildly elevated RVSP; b. TEE 08/28/2014: EF 30-35%, no intracardic thrombus, mildly dilated LA/RA, mild TR, mild-mod aortic sclerosis w/o stenosis; c. echo 09/2014: EF 55-60%, select images w/ inf HK  . History of nuclear stress test     a. 09/02/2014: no sig ischemia, GI uptake noted, no sig WMA,  EF 48%, no EKG chanes concerning for ischemia, low risk scan    . Normal coronary arteries     a. cardiac cath 12/03/2014: no sigificant CAD, right dominant system, LVEF >55%, no MR or AS  . Gangrene of finger (HCC)     Medications:  Scheduled:  . diltiazem  30 mg Oral QID  . fluticasone  2 spray Each Nare Daily  . folic acid  1 mg Oral Daily  . methylPREDNISolone (SOLU-MEDROL) injection  1,000 mg Intravenous Q24H  . metoprolol  75 mg Oral BID  . pantoprazole  40 mg Oral Daily  . sodium chloride flush  3 mL Intravenous Q12H   Infusions:     Assessment: Pharmacy consulted to assist in managing electrolytes in this 55 y/o M with anemia s/p transfusion.   Electrolytes wnl   Plan:  Will recheck electrolytes on 7/5.   Pharmacy will continue to monitor and adjust per consult.   Dicie Beam D 11/29/2015,9:27 AM

## 2015-11-30 LAB — TYPE AND SCREEN
ABO/RH(D): O POS
Antibody Screen: NEGATIVE
UNIT DIVISION: 0
Unit division: 0

## 2015-11-30 LAB — CBC
HCT: 27 % — ABNORMAL LOW (ref 40.0–52.0)
Hemoglobin: 9.4 g/dL — ABNORMAL LOW (ref 13.0–18.0)
MCH: 31 pg (ref 26.0–34.0)
MCHC: 34.7 g/dL (ref 32.0–36.0)
MCV: 89.4 fL (ref 80.0–100.0)
PLATELETS: 212 10*3/uL (ref 150–440)
RBC: 3.02 MIL/uL — ABNORMAL LOW (ref 4.40–5.90)
RDW: 17.4 % — AB (ref 11.5–14.5)
WBC: 8.5 10*3/uL (ref 3.8–10.6)

## 2015-11-30 MED ORDER — PREDNISONE 50 MG PO TABS: 50.0000 mg | ORAL_TABLET | Freq: Every day | ORAL | Status: DC

## 2015-11-30 MED ORDER — PREDNISONE 10 MG (21) PO TBPK: 40.0000 mg | ORAL_TABLET | Freq: Every day | ORAL | Status: DC

## 2015-11-30 NOTE — Discharge Instructions (Signed)
Blood Transfusion, Care After These instructions give you information about caring for yourself after your procedure. Your doctor may also give you more specific instructions. Call your doctor if you have any problems or questions after your procedure.  HOME CARE  Take medicines only as told by your doctor. Ask your doctor if you can take an over-the-counter pain reliever if you have a fever or headache a day or two after your procedure.  Return to your normal activities as told by your doctor. GET HELP IF:   You develop redness or irritation at your IV site.  You have a fever, chills, or a headache that does not go away.  Your pee (urine) is darker than normal.  Your urine turns:  Pink.  Red.  [place]ark.  The white part of your eye turns yellow (jaundice).  You feel weak after doing your normal activities. GET HELP RIGHT AWAY IF:   You have trouble breathing.  You have fever and chills and you also have:  Anxiety.  Chest or back pain.  Flushed or pink skin.  Clammy or sweaty skin.  A fast heartbeat.  A sick feeling in your stomach (nausea).   This information is not intended to replace advice given to you by your health care provider. Make sure you discuss any questions you have with your health care provider.   Document Released: 06/05/2014 Document Reviewed: 06/05/2014 Elsevier Interactive Patient Education 2016 [employer].  Anemia, Nonspecific Anemia is a condition in which the concentration of red blood cells or hemoglobin in the blood is below normal. Hemoglobin is a substance in red blood cells that carries oxygen to the tissues of the body. Anemia results in not enough oxygen reaching these tissues.  CAUSES  Common causes of anemia include:   Excessive bleeding. Bleeding may be internal or external. This includes excessive bleeding from periods (in women) or from the intestine.   Poor nutrition.   Chronic kidney, thyroid, and liver disease.  Bone  marrow disorders that decrease red blood cell production.  Cancer and treatments for cancer.  HIV, AIDS, and their treatments.  Spleen problems that increase red blood cell destruction.  Blood disorders.  Excess destruction of red blood cells due to infection, medicines, and autoimmune disorders. SIGNS AND SYMPTOMS   Minor weakness.   Dizziness.   Headache.  Palpitations.   Shortness of breath, especially with exercise.   Paleness.  Cold sensitivity.  Indigestion.  Nausea.  Difficulty sleeping.  Difficulty concentrating. Symptoms may occur suddenly or they may develop slowly.  DIAGNOSIS  Additional blood tests are often needed. These help your health care provider determine the best treatment. Your health care provider will check your stool for blood and look for other causes of blood loss.  TREATMENT  Treatment varies depending on the cause of the anemia. Treatment can include:   Supplements of iron, vitamin 123456, or folic acid.   Hormone medicines.   A blood transfusion. This may be needed if blood loss is severe.   Hospitalization. This may be needed if there is significant continual blood loss.   Dietary changes.  Spleen removal. HOME CARE INSTRUCTIONS Keep all follow-up appointments. It often takes many weeks to correct anemia, and having your health care provider check on your condition and your response to treatment is very important. SEEK IMMEDIATE MEDICAL CARE IF:   You develop extreme weakness, shortness of breath, or chest pain.   You become dizzy or have trouble concentrating.  You develop heavy vaginal  bleeding.   You develop a rash.   You have bloody or black, tarry stools.   You faint.   You vomit up blood.   You vomit repeatedly.   You have abdominal pain.  You have a fever or persistent symptoms for more than 2-3 days.   You have a fever and your symptoms suddenly get worse.   You are dehydrated.  MAKE  SURE YOU:  Understand these instructions.  Will watch your condition.  Will get help right away if you are not doing well or get worse.   This information is not intended to replace advice given to you by your health care provider. Make sure you discuss any questions you have with your health care provider.   Document Released: 06/22/2004 Document Revised: 01/15/2013 Document Reviewed: 11/08/2012 Elsevier Interactive Patient Education [employer].

## 2015-11-30 NOTE — Progress Notes (Signed)
[name]   DOB:December 19, 1960   [id number]    Subjective: Patient received IV methylprednisolone 1000 milligram x 3 doses so far. No dizziness. No SOB.   He has been up and about in the hallways. Denies any epigastric discomfort.   ROS: No chest pain or or cough. No fevers. No diarrhea.  Objective:  Filed Vitals:   11/30/15 0428 11/30/15 0956  BP:  127/73  Pulse: 57 62  Temp:  98 F (36.7 C)  Resp:  18     Intake/Output Summary (Last 24 hours) at 11/30/15 1043 Last data filed at 11/30/15 0900  Gross per 24 hour  Intake   1390 ml  Output    400 ml  Net    990 ml    GENERAL:alert, no distress and comfortable; accompanied by family. SKIN: skin color, texture, turgor are normal, no rashes or significant lesions EYES: normal, Conjunctiva are pink and non-injected, sclera clear OROPHARYNX:no exudate, no erythema and lips, buccal mucosa, and tongue normal  NECK: supple, thyroid normal size, non-tender, without nodularity LYMPH:  no palpable lymphadenopathy in the cervical, axillary or inguinal LUNGS: clear to auscultation and percussion with normal breathing effort HEART: regular rate & rhythm and no murmurs and no lower extremity edema ABDOMEN:abdomen soft, non-tender and normal bowel sounds Musculoskeletal:no cyanosis of digits and no clubbing  NEURO: alert & oriented x 3 with fluent speech, no focal motor/sensory deficits   Labs:  Lab Results  Component Value Date   WBC 8.5 11/30/2015   HGB 9.4* 11/30/2015   HCT 27.0* 11/30/2015   MCV 89.4 11/30/2015   PLT 212 11/30/2015   NEUTROABS 6.8* 11/23/2015    Lab Results  Component Value Date   NA 137 11/29/2015   K 4.2 11/29/2015   CL 111 11/29/2015   CO2 21* 11/29/2015    Studies:  No results found.  Assessment & Plan:   55 yo with Rheumatoid arthritis/connective tissue disorder- with a significant drop of hemoglobin from a baseline 14 to 6 at presentation/few days ago; negative for bleed.  # Severe  anemia- likely hemolytic anemia [low haptoglobin/ high LDH/ DAT- positive]- likely secondary to underlying disorder. S/p Methylpredinoslone- 1gm/day x3 so far. S/p 1 unit PRBC transfusion- Hb 9.5 today.  He could be discharged home on prednisone 90 mg once a day; we will plan taper as an out patient.    We will plan for patient's CBC for July 6th; # Also discussed the patient and family that if his hemoglobin does not improve/ or he relapses soon after steroids- other options like like rituximab etc. Also, pt can be seen in [facility] clinic.   # The above plan of care was discussed the patient and family in detail. Also spoke to Dr.Vachhani in detail.    Cammie Sickle, MD 11/30/2015  10:43 AM

## 2015-11-30 NOTE — Discharge Summary (Signed)
[facility] NAME: [name]    MR#:  [id number]  DATE OF BIRTH:  05/23/1961  DATE OF ADMISSION:  11/22/2015 ADMITTING PHYSICIAN: Loletha Grayer, MD  DATE OF DISCHARGE: 11/30/2015  PRIMARY CARE PHYSICIAN: Otilio Miu, MD    ADMISSION DIAGNOSIS:  dizzy and lightheaded Anemia  DISCHARGE DIAGNOSIS:  Principal Problem:   Hemorrhagic shock Active Problems:   Anemia   Iron deficiency anemia, unspecified   Hernia, hiatal   Acute esophagitis   Hemolytic anemia (HCC)   Arrhythmia   Absolute anemia   Weakness   SECONDARY DIAGNOSIS:   Past Medical History  Diagnosis Date  . Atrial flutter ([place])     a. on Xarelto; b. s/p successful TEE/DCCV on 08/28/2014  . Pulmonary fibrosis ([place])     a. not on home oxygen  . RA (rheumatoid arthritis) ([place])   . HTN (hypertension)   . 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 echo 07/2014: EF 20-25%, cannot exclude atrial flutter,  global HK, possible bicuspid Ao valve, mod reduced RV sys fxn, mild-mod aortic valve sclerosis/calcification, mod TR, mildly elevated RVSP; b. TEE 08/28/2014: EF 30-35%, no intracardic thrombus, mildly dilated LA/RA, mild TR, mild-mod aortic sclerosis w/o stenosis; c. echo 09/2014: EF 55-60%, select images w/ inf HK  . History of nuclear stress test     a. 09/02/2014: no sig ischemia, GI uptake noted, no sig WMA, EF 48%, no EKG chanes concerning for ischemia, low risk scan    . Normal coronary arteries     a. cardiac cath 12/03/2014: no sigificant CAD, right dominant system, LVEF >55%, no MR or AS  . Gangrene of finger ([place])     HOSPITAL COURSE:   1. Severe symptomatic anemia with falls. Acute drop in hemoglobin from 14 down to 5.5. Immune mediated hemolytic anemia Received 2 units of transfusion by ER. Hemoglobin improved and dropped again., Given one more unit PRBC on 11/24/15  Iron studies shows no Iron deficiency. MCV is normal.  LDH is high and  haptoglobin is low suggestive of hemolysis.  As patient never had GI workup GI had done endoscopy and colonoscopy - no source for bleeding.  hematology consult for hemolysis.  Hold anti-coagulation for now.  Appreciated hematology consult- ordered autoimmune work ups.  Started on oral steroids- 11/24/15  Inspite of 3 days with oral steroids, still Hb keep dropping-   Started IV steroid high dose 11/27/15-   Slight drop in Hb again on 11/29/15 from 7.9 to 7.6- transfuse one more unit PRBC as per hematology .     Hb up > 9- Oncology suggested to discharge on oral steroids, and close follow ups.  2. History of atrial flutter.   Have some tachycardia, call cardio consult.  On low-dose Cardizem  Rate is reasonable for anemia, monitoring for now.    Hematology permitted to have xarelto started. 3. Pulmonary fibrosis. Continue inhalers 4. Rheumatoid arthritis 5. Hypokalemia replace potassium and stop Microzide- normal now. 6. Hypotension  Given bolus IV fluid and he responded, .  Check infection work up- negative so far, stable now. 7. hyperbilirubenia  Due to hemolysis, stable now.  DISCHARGE CONDITIONS:   Stable.  CONSULTS OBTAINED:  Treatment Team:  Minna Merritts, MD Cammie Sickle, MD  DRUG ALLERGIES:  No Known Allergies  DISCHARGE MEDICATIONS:   Current Discharge Medication List    START taking these medications   Details  predniSONE (DELTASONE) 50 MG tablet Take  1 tablet (50 mg total) by mouth daily with breakfast. Take prednisone 90 mg daily. So take this along with other 40 mg. Qty: 15 tablet, Refills: 0    predniSONE (STERAPRED UNI-PAK 21 TAB) 10 MG (21) TBPK tablet Take 4 tablets (40 mg total) by mouth daily. Take along with 50 mg tablets - so he need to take 90 mg total prednisone daily. Qty: 63 tablet, Refills: 0      CONTINUE these medications which have NOT CHANGED   Details  albuterol (PROVENTIL HFA;VENTOLIN HFA) 108 (90 Base)  MCG/ACT inhaler Inhale 2 puffs into the lungs 4 (four) times daily as needed for wheezing or shortness of breath.    diltiazem (CARDIZEM) 30 MG tablet Take 1 tablet (30 mg total) by mouth 4 (four) times daily. Every 6 hours as needed for heart rate greater than 90 bpm Qty: 30 tablet, Refills: 0    enalapril (VASOTEC) 20 MG tablet Take 20 mg by mouth daily.    fluticasone (FLONASE) 50 MCG/ACT nasal spray Place 2 sprays into both nostrils daily. Qty: 16 g, Refills: 0    meloxicam (MOBIC) 15 MG tablet Take 1 tablet (15 mg total) by mouth daily. Qty: 30 tablet, Refills: 0   Associated Diagnoses: Heel pain, left; Tendonitis, Achilles, left    metoprolol (LOPRESSOR) 50 MG tablet Take 75 mg by mouth 2 (two) times daily.    rivaroxaban (XARELTO) 20 MG TABS tablet Take 20 mg by mouth every morning.      STOP taking these medications     hydrochlorothiazide (MICROZIDE) 12.5 MG capsule          DISCHARGE INSTRUCTIONS:    Follow with Hematology clinic in 3-4 days.  If you experience worsening of your admission symptoms, develop shortness of breath, life threatening emergency, suicidal or homicidal thoughts you must seek medical attention immediately by calling 911 or calling your MD immediately  if symptoms less severe.  You Must read complete instructions/literature along with all the possible adverse reactions/side effects for all the Medicines you take and that have been prescribed to you. Take any new Medicines after you have completely understood and accept all the possible adverse reactions/side effects.   Please note  You were cared for by a hospitalist during your hospital stay. If you have any questions about your discharge medications or the care you received while you were in the hospital after you are discharged, you can call the unit and asked to speak with the hospitalist on call if the hospitalist that took care of you is not available. Once you are discharged, your primary  care physician will handle any further medical issues. Please note that NO REFILLS for any discharge medications will be authorized once you are discharged, as it is imperative that you return to your primary care physician (or establish a relationship with a primary care physician if you do not have one) for your aftercare needs so that they can reassess your need for medications and monitor your lab values.    Today   CHIEF COMPLAINT:   Chief Complaint  Patient presents with  . Dizziness    HISTORY OF PRESENT ILLNESS:  [name]  is a 55 y.o. male with a known history of Presents with dizziness and falls. The patient also feels very weak and has a poor appetite and lost 8 pounds in the last week. He's been having some diarrhea that's been yellow as been going on and off. He complains of some shortness of  breath. His urine is also dark. In the ER he was found to have a severely low hemoglobin and was guaiac negative. Hospitalist services contacted for further evaluation.    VITAL SIGNS:  Blood pressure 127/73, pulse 62, temperature 98 F (36.7 C), temperature source Oral, resp. rate 18, height 5\' 10"  (1.778 m), weight 93.895 kg (207 lb), SpO2 98 %.  I/O:   Intake/Output Summary (Last 24 hours) at 11/30/15 1317 Last data filed at 11/30/15 0900  Gross per 24 hour  Intake   1390 ml  Output    400 ml  Net    990 ml    PHYSICAL EXAMINATION:   GENERAL: 55 y.o.-year-old patient lying in the bed with no acute distress.  EYES: Pupils equal, round, reactive to light and accommodation. No scleral icterus. Extraocular muscles intact. Conjunctiva pale. HEENT: Head atraumatic, normocephalic. Oropharynx and nasopharynx clear.  NECK: Supple, no jugular venous distention. No thyroid enlargement, no tenderness.  LUNGS: Normal breath sounds bilaterally, no wheezing, rales,rhonchi or crepitation. No use of accessory muscles of respiration.  CARDIOVASCULAR: S1, S2 normal. No murmurs,  rubs, or gallops.  ABDOMEN: Soft, nontender, nondistended. Bowel sounds present. No organomegaly or mass.  EXTREMITIES: No pedal edema, cyanosis, or clubbing.  NEUROLOGIC: Cranial nerves II through XII are intact. Muscle strength 5/5 in all extremities. Sensation intact. Gait not checked.  PSYCHIATRIC: The patient is alert and oriented x 3.  SKIN: No obvious rash, lesion, or ulcer.   DATA REVIEW:   CBC  Recent Labs Lab 11/30/15 0532  WBC 8.5  HGB 9.4*  HCT 27.0*  PLT 212    Chemistries   Recent Labs Lab 11/28/15 0406 11/29/15 0519  NA 136 137  K 4.6 4.2  CL 107 111  CO2 23 21*  GLUCOSE 134* 130*  BUN 29* 31*  CREATININE 0.99 0.95  CALCIUM 9.1 9.1  MG 1.9 2.0  AST 25  --   ALT 26  --   ALKPHOS 56  --   BILITOT 1.5*  --     Cardiac Enzymes No results for input(s): TROPONINI in the last 168 hours.  Microbiology Results  Results for orders placed or performed during the hospital encounter of 11/22/15  CULTURE, BLOOD (ROUTINE X 2) w Reflex to ID Panel     Status: None   Collection Time: 11/24/15  3:22 PM  Result Value Ref Range Status   Specimen Description BLOOD LEFT ASSIST CONTROL  Final   Special Requests   Final    BOTTLES DRAWN AEROBIC AND ANAEROBIC  AERO 4CC ANA [place]   Culture NO GROWTH 5 DAYS  Final   Report Status 11/29/2015 FINAL  Final  CULTURE, BLOOD (ROUTINE X 2) w Reflex to ID Panel     Status: None   Collection Time: 11/24/15  4:45 PM  Result Value Ref Range Status   Specimen Description BLOOD LEFT HAND  Final   Special Requests   Final    BOTTLES DRAWN AEROBIC AND ANAEROBIC  AERO 5CC ANA [place]   Culture NO GROWTH 5 DAYS  Final   Report Status 11/29/2015 FINAL  Final  MRSA PCR Screening     Status: None   Collection Time: 11/25/15  1:00 PM  Result Value Ref Range Status   MRSA by PCR NEGATIVE NEGATIVE Final    Comment:        The GeneXpert MRSA Assay (FDA approved for NASAL specimens only), is one component of a comprehensive MRSA  colonization surveillance program. It is  not intended to diagnose MRSA infection nor to guide or monitor treatment for MRSA infections.     RADIOLOGY:  No results found.  EKG:   Orders placed or performed during the hospital encounter of 11/22/15  . ED EKG  . ED EKG      Management plans discussed with the patient, family and they are in agreement.  CODE STATUS:     Code Status Orders        Start     Ordered   11/22/15 1231  Full code   Continuous     11/22/15 1230    Code Status History    Date Active Date Inactive Code Status Order ID Comments User Context   11/17/2015 11:14 AM 11/17/2015  4:27 PM Full Code [id number]  Wellington Hampshire, MD Inpatient   12/01/2014  2:11 PM 12/03/2014  4:13 PM Full Code [id number]  Henreitta Leber, MD Inpatient      TOTAL TIME TAKING CARE OF THIS PATIENT: 35 minutes.    Vaughan Basta M.D on 11/30/2015 at 1:17 PM  Between 7am to 6pm - Pager - [phone]  After 6pm go to [email or web address] - password [email or web address] [facility]  Office  [phone]  CC: Primary care physician; Otilio Miu, MD   Note: This dictation was prepared with Dragon dictation along with smaller phrase technology. Any transcriptional errors that result from this process are unintentional.

## 2015-11-30 NOTE — Progress Notes (Signed)
MD making rounds. Discharge orders received. IV's discontinued. Prescriptions given to patient. Provided with Education Handouts. Discharge paperwork provided, explained, signed and witnessed. No unanswered questions. Discharged via wheelchair by Nursing Staff. Belongings sent with patient and family.

## 2015-12-01 ENCOUNTER — Inpatient Hospital Stay: Payer: 59 | Attending: Internal Medicine

## 2015-12-01 ENCOUNTER — Telehealth: Payer: Self-pay | Admitting: *Deleted

## 2015-12-01 DIAGNOSIS — D509 Iron deficiency anemia, unspecified: Secondary | ICD-10-CM

## 2015-12-01 DIAGNOSIS — D594 Other nonautoimmune hemolytic anemias: Secondary | ICD-10-CM | POA: Insufficient documentation

## 2015-12-01 DIAGNOSIS — G720 Drug-induced myopathy: Secondary | ICD-10-CM | POA: Diagnosis not present

## 2015-12-01 DIAGNOSIS — I4891 Unspecified atrial fibrillation: Secondary | ICD-10-CM | POA: Insufficient documentation

## 2015-12-01 DIAGNOSIS — R531 Weakness: Secondary | ICD-10-CM | POA: Diagnosis not present

## 2015-12-01 DIAGNOSIS — T380X5A Adverse effect of glucocorticoids and synthetic analogues, initial encounter: Secondary | ICD-10-CM | POA: Insufficient documentation

## 2015-12-01 DIAGNOSIS — I429 Cardiomyopathy, unspecified: Secondary | ICD-10-CM | POA: Insufficient documentation

## 2015-12-01 DIAGNOSIS — Z79899 Other long term (current) drug therapy: Secondary | ICD-10-CM | POA: Diagnosis not present

## 2015-12-01 DIAGNOSIS — I10 Essential (primary) hypertension: Secondary | ICD-10-CM | POA: Diagnosis not present

## 2015-12-01 DIAGNOSIS — Z87891 Personal history of nicotine dependence: Secondary | ICD-10-CM | POA: Insufficient documentation

## 2015-12-01 DIAGNOSIS — M069 Rheumatoid arthritis, unspecified: Secondary | ICD-10-CM | POA: Diagnosis not present

## 2015-12-01 LAB — CBC WITH DIFFERENTIAL/PLATELET
Basophils Absolute: 0.1 10*3/uL (ref 0–0.1)
EOS ABS: 0 10*3/uL (ref 0–0.7)
Eosinophils Relative: 0 %
HCT: 28 % — ABNORMAL LOW (ref 40.0–52.0)
HEMOGLOBIN: 9.4 g/dL — AB (ref 13.0–18.0)
Lymphocytes Relative: 4 %
Lymphs Abs: 0.4 10*3/uL — ABNORMAL LOW (ref 1.0–3.6)
MCH: 29.8 pg (ref 26.0–34.0)
MCHC: 33.7 g/dL (ref 32.0–36.0)
MCV: 88.5 fL (ref 80.0–100.0)
Monocytes Absolute: 0.7 10*3/uL (ref 0.2–1.0)
Neutro Abs: 9.2 10*3/uL — ABNORMAL HIGH (ref 1.4–6.5)
PLATELETS: 214 10*3/uL (ref 150–440)
RBC: 3.16 MIL/uL — ABNORMAL LOW (ref 4.40–5.90)
RDW: 22.8 % — AB (ref 11.5–14.5)
WBC: 10.3 10*3/uL (ref 3.8–10.6)

## 2015-12-01 LAB — SAMPLE TO BLOOD BANK

## 2015-12-01 LAB — LACTATE DEHYDROGENASE: LDH: 271 U/L — ABNORMAL HIGH (ref 98–192)

## 2015-12-01 NOTE — Telephone Encounter (Signed)
-----   Message from Cammie Sickle, MD sent at 11/29/2015  6:48 PM EDT ----- Needs follow up in 1 week/ he lives in [place] with cbc.   Please order- Cbc/ldh - wed/ Friday- starting 5th. Thx

## 2015-12-01 NOTE — Telephone Encounter (Signed)
msg sent to cancer center scheduling.  Labs added per md order.

## 2015-12-02 ENCOUNTER — Telehealth: Payer: Self-pay | Admitting: *Deleted

## 2015-12-02 NOTE — Telephone Encounter (Signed)
-----   Message from Cephus Richer sent at 12/02/2015 11:34 AM EDT ----- Contact: [phone] Pt would like to know lab result from yesterday.

## 2015-12-02 NOTE — Telephone Encounter (Signed)
-----   Message from Cammie Sickle, MD sent at 12/01/2015  8:14 PM EDT ----- Continue current dose of steroids/ labs as planned/ follow up in [place] on 11th. Thx

## 2015-12-02 NOTE — Telephone Encounter (Signed)
Called patient to inform him to continue steroids as planned.  Also will follow up with labs as planned and see MD in [place] on 11-27-15.  Patient verbalized understanding.

## 2015-12-03 ENCOUNTER — Telehealth: Payer: Self-pay | Admitting: *Deleted

## 2015-12-03 ENCOUNTER — Encounter: Payer: Self-pay | Admitting: Cardiovascular Disease

## 2015-12-03 ENCOUNTER — Ambulatory Visit (INDEPENDENT_AMBULATORY_CARE_PROVIDER_SITE_OTHER): Payer: 59 | Admitting: Cardiovascular Disease

## 2015-12-03 ENCOUNTER — Inpatient Hospital Stay: Payer: 59

## 2015-12-03 VITALS — BP 112/80 | HR 69 | Ht 70.0 in | Wt 213.5 lb

## 2015-12-03 DIAGNOSIS — R579 Shock, unspecified: Secondary | ICD-10-CM | POA: Diagnosis not present

## 2015-12-03 DIAGNOSIS — I43 Cardiomyopathy in diseases classified elsewhere: Secondary | ICD-10-CM

## 2015-12-03 DIAGNOSIS — D591 Other autoimmune hemolytic anemias: Secondary | ICD-10-CM

## 2015-12-03 DIAGNOSIS — R578 Other shock: Secondary | ICD-10-CM

## 2015-12-03 DIAGNOSIS — I483 Typical atrial flutter: Secondary | ICD-10-CM | POA: Diagnosis not present

## 2015-12-03 DIAGNOSIS — R000 Tachycardia, unspecified: Secondary | ICD-10-CM | POA: Diagnosis not present

## 2015-12-03 DIAGNOSIS — D5919 Other autoimmune hemolytic anemia: Secondary | ICD-10-CM

## 2015-12-03 DIAGNOSIS — M069 Rheumatoid arthritis, unspecified: Secondary | ICD-10-CM

## 2015-12-03 DIAGNOSIS — I428 Other cardiomyopathies: Secondary | ICD-10-CM

## 2015-12-03 DIAGNOSIS — J8410 Pulmonary fibrosis, unspecified: Secondary | ICD-10-CM

## 2015-12-03 DIAGNOSIS — D509 Iron deficiency anemia, unspecified: Secondary | ICD-10-CM

## 2015-12-03 DIAGNOSIS — D594 Other nonautoimmune hemolytic anemias: Secondary | ICD-10-CM | POA: Diagnosis not present

## 2015-12-03 LAB — CBC WITH DIFFERENTIAL/PLATELET
BASOS ABS: 0 10*3/uL (ref 0–0.1)
Basophils Relative: 1 %
EOS ABS: 0 10*3/uL (ref 0–0.7)
HCT: 28.7 % — ABNORMAL LOW (ref 40.0–52.0)
Hemoglobin: 9.8 g/dL — ABNORMAL LOW (ref 13.0–18.0)
LYMPHS ABS: 0.4 10*3/uL — AB (ref 1.0–3.6)
Lymphocytes Relative: 5 %
MCH: 30.3 pg (ref 26.0–34.0)
MCHC: 34.1 g/dL (ref 32.0–36.0)
MCV: 88.8 fL (ref 80.0–100.0)
Monocytes Absolute: 0.4 10*3/uL (ref 0.2–1.0)
Monocytes Relative: 6 %
Neutro Abs: 6.7 10*3/uL — ABNORMAL HIGH (ref 1.4–6.5)
Neutrophils Relative %: 88 %
PLATELETS: 225 10*3/uL (ref 150–440)
RBC: 3.23 MIL/uL — AB (ref 4.40–5.90)
RDW: 23.7 % — ABNORMAL HIGH (ref 11.5–14.5)
WBC: 7.6 10*3/uL (ref 3.8–10.6)

## 2015-12-03 LAB — LACTATE DEHYDROGENASE: LDH: 223 U/L — AB (ref 98–192)

## 2015-12-03 NOTE — Telephone Encounter (Signed)
Rodena Piety, RN spoke with patient this morning.

## 2015-12-03 NOTE — Patient Instructions (Signed)
Ask hematology about xarelto  Medication Instructions:   No medication changes  Labwork:  No  New labs  Testing/Procedures:  No new tests  Follow-Up: It was a pleasure seeing you in the office today. Please call us if you have new issues that need to be addressed before your next appt.  [phone]  Your physician wants you to follow-up in: 6 months.  You will receive a reminder letter in the mail two months in advance. If you don't receive a letter, please call our office to schedule the follow-up appointment.  If you need a refill on your cardiac medications before your next appointment, please call your pharmacy.

## 2015-12-03 NOTE — Telephone Encounter (Signed)
Called patient to inform him that his labs are slightly improved.  Patient appreciated call.

## 2015-12-03 NOTE — Addendum Note (Signed)
Addended by: Minna Merritts on: 12/03/2015 01:43 PM   Modules accepted: Level of Service

## 2015-12-03 NOTE — Progress Notes (Signed)
Patient ID: [name], male   DOB: Jul 18, 1960, 55 y.o.   MRN: [id number] Cardiology Office Note  Date:  12/03/2015   ID:  [name], DOB Jun 28, 1960, MRN [id number]  PCP:  Otilio Miu, MD   Chief Complaint  Patient presents with  . other    1 month f/u echo. Meds reviewed verbally with pt.    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,  Who presents after recent hospitalization for hemolytic anemia, shortness of breath  He presented to the hospital 11/22/2015 with weakness, fall, hemoglobin of 5.5 Guiac negative, GI was consult and colonoscopy and EGD revealed no evidence of bleeding, Further evaluation by hematology confirmed hemolytic anemia, started on high-dose prednisone He did have transfusion 4 Over the past week, hemoglobin has slowly improved, now 9.4 Starting to feel better, still not at his baseline, residual shortness of breath and fatigue Denies any chest pain, leg edema Through his hospital course anticoagulation was held. He has not restarted his xarelto despite no source of active bleeding.  EKG on today's visit shows sinus rhythm with rate 69 bpm, APCs  He is seen on a regular basis by Dr. Jefm Bryant for his mixed connective tissue disease Last visit January 2017 per the notes  Other past medical history Previous 30 day monitor that showed 3 episodes of PVCs in a bigeminal manner. He was asymptomatic. Arrhythmia sometimes in the morning, other times in the early evening. Was not on a consistent  basis.  admitted to [facility] in April of 2016 for 3 week history of increased dyspnea and palpitations and found to be in new onset atrial flutter.  TTE showed EF 20-25%, global HK, possible bicuspid aortic valve.  successful TEE/DCCV on 4/1. 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   Repeat TTE showed improved EF of 55-60%, select images suggestive of hypokinesis of the inferior and posterior myocardium. LV diastolic parameters were normal. Left atrium was normal in size. RV function was normal. PASP was normal. He was advised further work up if he has symptoms.   On 11/27/14 while at work he suddenly developed onset of palpitations that led to increased SOB, especially with ambulation causing patient to leave work early. Patient checked his pulse and found it to be in the 140's.Pulse went back into the 60's to 80's without intervention.  Recurrent chest pain 7/5 he was woken up around 2 AM with left shoulder pain that radiated to his left chest.  He presented to [facility] for evaluation. Troponin was found to be mildly elevated and flated trending 0.08-->0.05-->0.06-->0.07.  He underwent cardiac cath 48 hours which showed right dominant system, no significant CAD, normal LV gram with EF >55%  the patient reports hand sensitivity to cold which started about 2 years ago and has been worsening. His fingers turn blue and her with significant pain when he is exposed to cold weather. He usually does better in the summertime. He developed an ulceration on the tip of the left small finger  about 3 months ago which has gradually worsened with early gangrenous changes. No trauma.  he underwent upper extremity duplex which showed atypical flow in the ulnar artery and normal flow through the radial artery.  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  PSH:    Past Surgical History  Procedure Laterality Date  . Cardiac catheterization N/A 12/03/2014    Procedure: Left Heart Cath and Coronary Angiography;  Surgeon: Minna Merritts, MD;  Location: [facility];  Service: Cardiovascular;  Laterality: N/A;  . Peripheral vascular catheterization N/A 11/17/2015    Procedure: Upper Extremity Angiography;  Surgeon: Wellington Hampshire, MD;  Location: [facility];  Service: Cardiovascular;  Laterality: N/A;  . Esophagogastroduodenoscopy (egd) with propofol N/A 11/23/2015    Procedure: ESOPHAGOGASTRODUODENOSCOPY (EGD) WITH PROPOFOL;  Surgeon: [name], MD;  Location: [facility] ENDOSCOPY;  Service: Endoscopy;  Laterality: N/A;  . Colonoscopy with propofol N/A 11/23/2015    Procedure: COLONOSCOPY WITH PROPOFOL;  Surgeon: [name], MD;  Location: [facility] ENDOSCOPY;  Service: Endoscopy;  Laterality: N/A;    Current Outpatient Prescriptions  Medication Sig Dispense Refill  . albuterol (PROVENTIL HFA;VENTOLIN HFA) 108 (90 Base) MCG/ACT inhaler Inhale 2 puffs into the lungs 4 (four) times daily as needed for wheezing or shortness of breath.    . diltiazem (CARDIZEM) 30 MG tablet Take 1 tablet (30 mg total) by mouth 4 (four) times daily. Every 6 hours as needed for heart rate greater than 90 bpm 30 tablet 0  . enalapril (VASOTEC) 20 MG tablet Take 20 mg by mouth daily.    . fluticasone (FLONASE) 50 MCG/ACT nasal spray Place 2 sprays into both nostrils daily. 16 g 0  . meloxicam (MOBIC) 15 MG tablet Take 1 tablet (15 mg total) by mouth daily. 30 tablet 0  . metoprolol (LOPRESSOR) 50 MG tablet Take 75 mg by mouth 2 (two) times daily.    . predniSONE (DELTASONE) 50 MG tablet Take 1 tablet (50 mg total) by mouth daily with breakfast. Take prednisone 90 mg daily. So take this  along with other 40 mg. 15 tablet 0  . predniSONE (STERAPRED UNI-PAK 21 TAB) 10 MG (21) TBPK tablet Take 4 tablets (40 mg total) by mouth daily. Take along with 50 mg tablets - so he need to take 90 mg total prednisone daily. 63 tablet 0  . hydrochlorothiazide (MICROZIDE) 12.5 MG capsule Take 1 capsule (12.5 mg total) by mouth daily.     No current facility-administered medications for this visit.     Allergies:   Review of patient's allergies indicates no known allergies.   Social History:  The patient  reports that he quit smoking about 11 years ago. His smoking use included Cigarettes. He has a 20 pack-year smoking history. He has never used smokeless tobacco. He reports that he does not drink alcohol or use illicit drugs.   Family History:   family history includes Leukemia in his mother.    Review of Systems: Review of Systems  Constitutional: Positive for malaise/fatigue.  Respiratory: Negative.   Cardiovascular: Negative.   Gastrointestinal: Negative.   Musculoskeletal: Negative.   Neurological: Negative.   Psychiatric/Behavioral: Negative.   All other systems reviewed and are negative.    PHYSICAL EXAM: VS:  BP 112/80 mmHg  Pulse 69  Ht 5\' 10"  (1.778  m)  Wt 213 lb 8 oz (96.843 kg)  BMI 30.63 kg/m2 , BMI Body mass index is 30.63 kg/(m^2). GEN: Well nourished, well developed, in no acute distress HEENT: normal Neck: no JVD, carotid bruits, or masses Cardiac: RRR; no murmurs, rubs, or gallops,no edema  Respiratory:  clear to auscultation bilaterally, normal work of breathing GI: soft, nontender, nondistended, + BS MS: no deformity or atrophy Skin: warm and dry, no rash Neuro:  Strength and sensation are intact Psych: euthymic mood, full affect    Recent Labs: 11/28/2015: ALT 26 11/29/2015: BUN 31*; Creatinine, Ser 0.95; Magnesium 2.0; Potassium 4.2; Sodium 137 12/03/2015: Hemoglobin 9.8*; Platelets 225    Lipid Panel Lab Results  Component Value Date   CHOL 88  08/25/2014   HDL 24* 08/25/2014   LDLCALC 39 08/25/2014   TRIG 124 08/25/2014      Wt Readings from Last 3 Encounters:  12/03/15 213 lb 8 oz (96.843 kg)  11/22/15 207 lb (93.895 kg)  11/17/15 215 lb (97.523 kg)       ASSESSMENT AND PLAN:  Typical atrial flutter (HCC) - Plan: EKG 12-Lead Appears to be maintaining normal sinus rhythm No recent arrhythmia while in the hospital Currently not on his anticoagulation, xarelto. Suggested he discuss this with hematology As there was no source of bleeding, perhaps he could  Restart  Other autoimmune hemolytic anemias (HCC) Currently on high-dose prednisone, hematocrit stable and slowly climbing Scheduled to see hematology next week Previously seen by Dr. Jefm Bryant  Hemorrhagic shock Previous global and 5.4 in the hospital on arrival Most recently 9.4 after transfusion and prednisone  Pulmonary fibrosis ([place]) Denies significant shortness of breath beyond his mild baseline symptoms Previously seen by Dr. Raul Del  Rheumatoid arthritis, involving unspecified site, unspecified rheumatoid factor presence ([place]) Followed by Dr. Jefm Bryant, on high-dose prednisone  Occluded left ulnar artery Discovered by Dr. Fletcher Anon for nonhealing wound on his fingers Recommended referral back to Dr. Jefm Bryant   Total encounter time more than 25 minutes  Greater than 50% was spent in counseling and coordination of care with the patient   Disposition:   F/U  6 months   Orders Placed This Encounter  Procedures  . EKG 12-Lead     Signed, Esmond Plants, M.D., Ph.D. 12/03/2015  [place], [place]

## 2015-12-03 NOTE — Telephone Encounter (Signed)
-----   Message from Cammie Sickle, MD sent at 12/03/2015 12:42 PM EDT ----- Please Inform patient-hemoglobin improving;; continue steroids as ordered/labs as ordered/follow-up as planned. Thx

## 2015-12-07 ENCOUNTER — Inpatient Hospital Stay: Payer: 59

## 2015-12-07 ENCOUNTER — Inpatient Hospital Stay (HOSPITAL_BASED_OUTPATIENT_CLINIC_OR_DEPARTMENT_OTHER): Payer: 59 | Admitting: Internal Medicine

## 2015-12-07 VITALS — BP 122/80 | HR 80 | Temp 98.6°F | Resp 18 | Wt 213.2 lb

## 2015-12-07 DIAGNOSIS — G720 Drug-induced myopathy: Secondary | ICD-10-CM

## 2015-12-07 DIAGNOSIS — D594 Other nonautoimmune hemolytic anemias: Secondary | ICD-10-CM

## 2015-12-07 DIAGNOSIS — R531 Weakness: Secondary | ICD-10-CM

## 2015-12-07 DIAGNOSIS — D509 Iron deficiency anemia, unspecified: Secondary | ICD-10-CM

## 2015-12-07 DIAGNOSIS — Z79899 Other long term (current) drug therapy: Secondary | ICD-10-CM

## 2015-12-07 DIAGNOSIS — T380X5A Adverse effect of glucocorticoids and synthetic analogues, initial encounter: Secondary | ICD-10-CM | POA: Diagnosis not present

## 2015-12-07 DIAGNOSIS — I4891 Unspecified atrial fibrillation: Secondary | ICD-10-CM

## 2015-12-07 HISTORY — DX: Other nonautoimmune hemolytic anemias: D59.4

## 2015-12-07 LAB — LACTATE DEHYDROGENASE: LDH: 201 U/L — ABNORMAL HIGH (ref 98–192)

## 2015-12-07 LAB — CBC WITH DIFFERENTIAL/PLATELET
Basophils Absolute: 0.1 10*3/uL (ref 0–0.1)
Basophils Relative: 1 %
Eosinophils Absolute: 0 10*3/uL (ref 0–0.7)
Eosinophils Relative: 0 %
HCT: 33.7 % — ABNORMAL LOW (ref 40.0–52.0)
Hemoglobin: 11.3 g/dL — ABNORMAL LOW (ref 13.0–18.0)
Lymphocytes Relative: 5 %
Lymphs Abs: 0.6 10*3/uL — ABNORMAL LOW (ref 1.0–3.6)
MCH: 30.8 pg (ref 26.0–34.0)
MCHC: 33.5 g/dL (ref 32.0–36.0)
MCV: 91.9 fL (ref 80.0–100.0)
Monocytes Absolute: 0.6 10*3/uL (ref 0.2–1.0)
Monocytes Relative: 5 %
Neutro Abs: 10.8 10*3/uL — ABNORMAL HIGH (ref 1.4–6.5)
Neutrophils Relative %: 89 %
Platelets: 267 10*3/uL (ref 150–440)
RBC: 3.66 MIL/uL — ABNORMAL LOW (ref 4.40–5.90)
RDW: 25.2 % — ABNORMAL HIGH (ref 11.5–14.5)
WBC: 12.1 10*3/uL — ABNORMAL HIGH (ref 3.8–10.6)

## 2015-12-07 LAB — SAMPLE TO BLOOD BANK

## 2015-12-07 NOTE — Assessment & Plan Note (Addendum)
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 Patient's hemoglobin today is 11; I recommend starting prednisone 60mg /day - and will recommend cbc q weekly. Prednisone taper be placed on his weekly labs. We will be in communication with him regarding prednisone dosing going forward. If patient's counts started dropping; then rituximab might be an option.  # History of A. Fib- I think it's okay to start taking Xarelto as patient does not have GI bleed.  # Steroid myopathy- recommend gentle exercise/ hopefully will wean him off steroids soon.  #Patient still feels weak; I think it's okay to go back to work on 24th.   # Patient follow-up with me in 6 weeks/ he understands that will be a slow taper of his prednisone.

## 2015-12-07 NOTE — Progress Notes (Signed)
Disability forms were received last Thursday. I explained the patient that normally the return around time is 7-10 business days for any disability/fmla form.  I explained that MD was waiting on today's visit before these forms could be completed.  He gave verbal understanding.

## 2015-12-07 NOTE — Progress Notes (Signed)
Patient states his cardiologist wants to know when he can start back on Xarelto.  Patient states he has been walking.  His legs still get weak.  Asking about his disability forms.

## 2015-12-07 NOTE — Progress Notes (Signed)
[facility] OFFICE PROGRESS NOTE  Patient Care Team: Juline Patch, MD as PCP - General (Family Medicine) Minna Merritts, MD as Consulting Physician (Cardiology)  No matching staging information was found for the patient.    No history exists.   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  male pleasant patient above history of Autoimmune/connective tissue disorder- has been recently diagnosed with autoimmune hemolytic anemia while in the hospital in July 2017. Patient's hemoglobin dropped from a baseline around 14- to around 5 for which she was admitted to the hospital; extensive workup including GI was negative for any bleeding. Patient was started on prednisone; also received IV Solu-Medrol. Patient is here for follow-up/Plan of care.  Patient continues to feel weak; otherwise denies any blood in stools black red stools. Appetite is good. Complains of weakness in his upper extremities and lower extremities. He feels tired after walking.   REVIEW OF SYSTEMS:  A complete 10 point review of system is done which is negative except mentioned above/history of present illness.   PAST MEDICAL HISTORY :  Past Medical History  Diagnosis Date  . Atrial flutter ([place])     a. on Xarelto; b. s/p successful TEE/DCCV on 08/28/2014  . Pulmonary fibrosis ([place])     a. not on home oxygen  . RA (rheumatoid arthritis) ([place])   . HTN (hypertension)   . 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 echo 07/2014: EF 20-25%, cannot exclude atrial flutter,  global HK, possible bicuspid Ao valve, mod reduced RV sys fxn, mild-mod aortic valve sclerosis/calcification, mod TR, mildly elevated RVSP; b. TEE 08/28/2014: EF 30-35%, no intracardic thrombus, mildly dilated LA/RA, mild TR, mild-mod  aortic sclerosis w/o stenosis; c. echo 09/2014: EF 55-60%, select images w/ inf HK  . History of nuclear stress test     a. 09/02/2014: no sig ischemia, GI uptake noted, no sig WMA, EF 48%, no EKG chanes concerning for ischemia, low risk scan    . Normal coronary arteries     a. cardiac cath 12/03/2014: no sigificant CAD, right dominant system, LVEF >55%, no MR or AS  . Gangrene of finger ([place])     PAST SURGICAL HISTORY :   Past Surgical History  Procedure Laterality Date  . Cardiac catheterization N/A 12/03/2014    Procedure: Left Heart Cath and Coronary Angiography;  Surgeon: Minna Merritts, MD;  Location: [facility];  Service: Cardiovascular;  Laterality: N/A;  . Peripheral vascular catheterization N/A 11/17/2015    Procedure: Upper Extremity Angiography;  Surgeon: Wellington Hampshire, MD;  Location: [facility];  Service: Cardiovascular;  Laterality: N/A;  . Esophagogastroduodenoscopy (egd) with propofol N/A 11/23/2015    Procedure: ESOPHAGOGASTRODUODENOSCOPY (EGD) WITH PROPOFOL;  Surgeon: [name], MD;  Location: [facility];  Service: Endoscopy;  Laterality: N/A;  . Colonoscopy with propofol N/A 11/23/2015    Procedure: COLONOSCOPY WITH PROPOFOL;  Surgeon: [name], MD;  Location: [facility];  Service: Endoscopy;  Laterality: N/A;    FAMILY HISTORY :   Family History  Problem Relation Age of Onset  . Leukemia Mother     SOCIAL HISTORY:   Social History  Substance Use Topics  . Smoking status: Former Smoker -- 1.00 packs/day for 20 years    Types: Cigarettes    Quit date: 08/30/2004  .  Smokeless tobacco: Never Used  . Alcohol Use: No    ALLERGIES:  has No Known Allergies.  MEDICATIONS:  Current Outpatient Prescriptions  Medication Sig Dispense Refill  . albuterol (PROVENTIL HFA;VENTOLIN HFA) 108 (90 Base) MCG/ACT inhaler Inhale 2 puffs into the lungs 4 (four) times daily as needed for wheezing or shortness of breath.    . diltiazem (CARDIZEM) 30 MG  tablet Take 1 tablet (30 mg total) by mouth 4 (four) times daily. Every 6 hours as needed for heart rate greater than 90 bpm 30 tablet 0  . enalapril (VASOTEC) 20 MG tablet Take 20 mg by mouth daily.    . fluticasone (FLONASE) 50 MCG/ACT nasal spray Place 2 sprays into both nostrils daily. 16 g 0  . hydrochlorothiazide (MICROZIDE) 12.5 MG capsule Take 1 capsule (12.5 mg total) by mouth daily.    . meloxicam (MOBIC) 15 MG tablet Take 1 tablet (15 mg total) by mouth daily. 30 tablet 0  . metoprolol (LOPRESSOR) 50 MG tablet Take 75 mg by mouth 2 (two) times daily.    . predniSONE (DELTASONE) 50 MG tablet Take 1 tablet (50 mg total) by mouth daily with breakfast. Take prednisone 90 mg daily. So take this along with other 40 mg. 15 tablet 0  . predniSONE (STERAPRED UNI-PAK 21 TAB) 10 MG (21) TBPK tablet Take 4 tablets (40 mg total) by mouth daily. Take along with 50 mg tablets - so he need to take 90 mg total prednisone daily. 63 tablet 0   No current facility-administered medications for this visit.    PHYSICAL EXAMINATION:   BP 122/80 mmHg  Pulse 80  Temp(Src) 98.6 F (37 C) (Tympanic)  Resp 18  Wt 213 lb 3 oz (96.7 kg)  Filed Weights   12/07/15 1206  Weight: 213 lb 3 oz (96.7 kg)    GENERAL: Well-nourished well-developed; Alert, no distress and comfortable.  Alone.  EYES: no pallor or icterus OROPHARYNX: no thrush or ulceration; good dentition  NECK: supple, no masses felt LYMPH:  no palpable lymphadenopathy in the cervical, axillary or inguinal regions LUNGS: clear to auscultation and  No wheeze or crackles HEART/CVS: regular rate & rhythm and no murmurs; No lower extremity edema ABDOMEN:abdomen soft, non-tender and normal bowel sounds Musculoskeletal:no cyanosis of digits and no clubbing  PSYCH: alert & oriented x 3 with fluent speech NEURO: no focal motor/sensory deficits SKIN:  no rashes or significant lesions  LABORATORY DATA:  I have reviewed the data as listed     Component Value Date/Time   NA 137 11/29/2015 0519   NA 138 12/11/2014 1518   NA 137 08/27/2014 0403   K 4.2 11/29/2015 0519   K 4.1 08/27/2014 0403   CL 111 11/29/2015 0519   CL 108 08/27/2014 0403   CO2 21* 11/29/2015 0519   CO2 26 08/27/2014 0403   GLUCOSE 130* 11/29/2015 0519   GLUCOSE 93 12/11/2014 1518   GLUCOSE 94 08/27/2014 0403   BUN 31* 11/29/2015 0519   BUN 24 12/11/2014 1518   BUN 16 08/27/2014 0403   CREATININE 0.95 11/29/2015 0519   CREATININE 0.85 08/27/2014 0403   CALCIUM 9.1 11/29/2015 0519   CALCIUM 8.6* 08/27/2014 0403   PROT 7.3 11/28/2015 0406   ALBUMIN 3.4* 11/28/2015 0406   AST 25 11/28/2015 0406   ALT 26 11/28/2015 0406   ALKPHOS 56 11/28/2015 0406   BILITOT 1.5* 11/28/2015 0406   GFRNONAA >60 11/29/2015 0519   GFRNONAA >60 08/27/2014 0403   GFRAA >60 11/29/2015  [place] >60 08/27/2014 0403    No results found for: SPEP, UPEP  Lab Results  Component Value Date   WBC 12.1* 12/07/2015   NEUTROABS 10.8* 12/07/2015   HGB 11.3* 12/07/2015   HCT 33.7* 12/07/2015   MCV 91.9 12/07/2015   PLT 267 12/07/2015      Chemistry      Component Value Date/Time   NA 137 11/29/2015 0519   NA 138 12/11/2014 1518   NA 137 08/27/2014 0403   K 4.2 11/29/2015 0519   K 4.1 08/27/2014 0403   CL 111 11/29/2015 0519   CL 108 08/27/2014 0403   CO2 21* 11/29/2015 0519   CO2 26 08/27/2014 0403   BUN 31* 11/29/2015 0519   BUN 24 12/11/2014 1518   BUN 16 08/27/2014 0403   CREATININE 0.95 11/29/2015 0519   CREATININE 0.85 08/27/2014 0403      Component Value Date/Time   CALCIUM 9.1 11/29/2015 0519   CALCIUM 8.6* 08/27/2014 0403   ALKPHOS 56 11/28/2015 0406   AST 25 11/28/2015 0406   ALT 26 11/28/2015 0406   BILITOT 1.5* 11/28/2015 0406       RADIOGRAPHIC STUDIES: I have personally reviewed the radiological images as listed and agreed with the findings in the report. No results found.   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 Patient's hemoglobin today is 11; I recommend starting prednisone 60mg /day - and will recommend cbc q weekly. Prednisone taper be placed on his weekly labs. We will be in communication with him regarding prednisone dosing going forward. If patient's counts started dropping; then rituximab might be an option.  # History of A. Fib- I think it's okay to start taking Xarelto as patient does not have GI bleed.  # Steroid myopathy- recommend gentle exercise/ hopefully will wean him off steroids soon.  #Patient still feels weak; I think it's okay to go back to work on 24th.   # Patient follow-up with me in 6 weeks/ he understands that will be a slow taper of his prednisone.    Orders Placed This Encounter  Procedures  . CBC with Differential    Standing Status: Standing     Number of Occurrences: 6     Standing Expiration Date: 12/06/2016  . Lactate dehydrogenase    Standing Status: Standing     Number of Occurrences: 6     Standing Expiration Date: 12/06/2016   All questions were answered. The patient knows to call the clinic with any problems, questions or concerns.      Cammie Sickle, MD 12/07/2015 1:47 PM

## 2015-12-14 ENCOUNTER — Inpatient Hospital Stay: Payer: 59

## 2015-12-14 ENCOUNTER — Telehealth: Payer: Self-pay | Admitting: Internal Medicine

## 2015-12-14 DIAGNOSIS — D594 Other nonautoimmune hemolytic anemias: Secondary | ICD-10-CM | POA: Diagnosis not present

## 2015-12-14 LAB — CBC WITH DIFFERENTIAL/PLATELET
BASOS ABS: 0 10*3/uL (ref 0–0.1)
BASOS PCT: 0 %
EOS ABS: 0 10*3/uL (ref 0–0.7)
EOS PCT: 0 %
HCT: 35.8 % — ABNORMAL LOW (ref 40.0–52.0)
Hemoglobin: 11.9 g/dL — ABNORMAL LOW (ref 13.0–18.0)
Lymphocytes Relative: 6 %
Lymphs Abs: 0.5 10*3/uL — ABNORMAL LOW (ref 1.0–3.6)
MCH: 31.5 pg (ref 26.0–34.0)
MCHC: 33.3 g/dL (ref 32.0–36.0)
MCV: 94.5 fL (ref 80.0–100.0)
Monocytes Absolute: 0.3 10*3/uL (ref 0.2–1.0)
Monocytes Relative: 3 %
Neutro Abs: 8.4 10*3/uL — ABNORMAL HIGH (ref 1.4–6.5)
Neutrophils Relative %: 91 %
PLATELETS: 168 10*3/uL (ref 150–440)
RBC: 3.79 MIL/uL — AB (ref 4.40–5.90)
RDW: 24.1 % — ABNORMAL HIGH (ref 11.5–14.5)
WBC: 9.2 10*3/uL (ref 3.8–10.6)

## 2015-12-14 LAB — LACTATE DEHYDROGENASE: LDH: 188 U/L (ref 98–192)

## 2015-12-14 NOTE — Telephone Encounter (Signed)
Recommend cutting down the prednisone to 40 mg once a day; labs as planned. Further instructions to follow. Thx Please inform pt of above.

## 2015-12-14 NOTE — Telephone Encounter (Signed)
RN contacted patient. Pt informed to cut down prednisone dose to 40 mg per day. I reminded pt to take the prednisone with food to avoid gastritis. Pt informed to keep weekly lab apts. MD will recheck labs in 1 week. Pt will be contacted at that time regarding the dosing of prednisone. Teach back process performed.

## 2015-12-15 ENCOUNTER — Other Ambulatory Visit: Payer: Self-pay | Admitting: *Deleted

## 2015-12-15 ENCOUNTER — Telehealth: Payer: Self-pay | Admitting: *Deleted

## 2015-12-15 MED ORDER — PREDNISONE 20 MG PO TABS: 40.0000 mg | ORAL_TABLET | Freq: Every day | ORAL | Status: DC

## 2015-12-15 NOTE — Telephone Encounter (Signed)
Patient called in to request refill for Prednisone, prescription sent to [facility] in [place].

## 2015-12-16 ENCOUNTER — Telehealth: Payer: Self-pay | Admitting: *Deleted

## 2015-12-16 ENCOUNTER — Encounter: Payer: Self-pay | Admitting: *Deleted

## 2015-12-16 NOTE — Telephone Encounter (Signed)
Needs a letter to return to work without restrictions on Monday 7/24. Would like to pick up tomorrow

## 2015-12-17 NOTE — Telephone Encounter (Addendum)
Letter written-signed by Dr. Rogue Bussing. Pt notified that letter is ready to be picked up.  He will come pick up letter after lunch today.

## 2015-12-20 ENCOUNTER — Telehealth: Payer: Self-pay

## 2015-12-20 NOTE — Telephone Encounter (Signed)
Returned Mr. Rieken phone regarding FMLA paperwork.  Mr. Mousel said he explained last visit that it was supposed to be done before his last visit.  I explained to Mr. Barrozo that I did not see it in scanned in and Dr. [employer] primary nurse is not in today but looking through her file I do not see in it the sent.  Pt verbalized he did not know if if it was ever sent here.  I gave pt fax number and told him we could fill it out if it was faxed into [place].  Pt verbalized he would be calling the company to see if it was needed and if it was already received that he knows Marlowe Kays had sent in a long term.  He will contact us or have it sent here.

## 2015-12-21 ENCOUNTER — Telehealth: Payer: Self-pay

## 2015-12-21 ENCOUNTER — Inpatient Hospital Stay: Payer: 59

## 2015-12-21 DIAGNOSIS — D594 Other nonautoimmune hemolytic anemias: Secondary | ICD-10-CM

## 2015-12-21 LAB — CBC WITH DIFFERENTIAL/PLATELET
BASOS ABS: 0 10*3/uL (ref 0–0.1)
Basophils Relative: 1 %
Eosinophils Absolute: 0 10*3/uL (ref 0–0.7)
HEMATOCRIT: 38.1 % — AB (ref 40.0–52.0)
Hemoglobin: 12.6 g/dL — ABNORMAL LOW (ref 13.0–18.0)
LYMPHS ABS: 0.6 10*3/uL — AB (ref 1.0–3.6)
MCH: 31.3 pg (ref 26.0–34.0)
MCHC: 33 g/dL (ref 32.0–36.0)
MCV: 94.9 fL (ref 80.0–100.0)
MONO ABS: 0.3 10*3/uL (ref 0.2–1.0)
Monocytes Relative: 5 %
NEUTROS ABS: 5.4 10*3/uL (ref 1.4–6.5)
Neutrophils Relative %: 84 %
PLATELETS: 141 10*3/uL — AB (ref 150–440)
RBC: 4.01 MIL/uL — ABNORMAL LOW (ref 4.40–5.90)
RDW: 21.3 % — AB (ref 11.5–14.5)
WBC: 6.3 10*3/uL (ref 3.8–10.6)

## 2015-12-21 LAB — LACTATE DEHYDROGENASE: LDH: 183 U/L (ref 98–192)

## 2015-12-21 NOTE — Telephone Encounter (Signed)
Called pt to inform pt to cut down prednisone to 20mg /day- and continue weekly labs for further instructions.  Pt verbalized an understanding and stated he had medication at home.  I also informed pt that part of FMLA was done and Nira Conn and Rodena Piety would complete it and let him know when it was available.  No other concerns noted.

## 2015-12-24 ENCOUNTER — Telehealth: Payer: Self-pay

## 2015-12-24 NOTE — Telephone Encounter (Signed)
Notified patient that FMLA paperwork had been completed and returned to his employer.

## 2015-12-24 NOTE — Telephone Encounter (Signed)
Patient called to inquire about status of FMLA paperwork.  Message sent to Dr. Jacinto Reap team.

## 2015-12-28 ENCOUNTER — Inpatient Hospital Stay: Payer: 59 | Attending: Internal Medicine

## 2015-12-28 DIAGNOSIS — M359 Systemic involvement of connective tissue, unspecified: Secondary | ICD-10-CM | POA: Insufficient documentation

## 2015-12-28 DIAGNOSIS — G720 Drug-induced myopathy: Secondary | ICD-10-CM | POA: Insufficient documentation

## 2015-12-28 DIAGNOSIS — Z79899 Other long term (current) drug therapy: Secondary | ICD-10-CM | POA: Diagnosis not present

## 2015-12-28 DIAGNOSIS — M069 Rheumatoid arthritis, unspecified: Secondary | ICD-10-CM | POA: Insufficient documentation

## 2015-12-28 DIAGNOSIS — T380X5A Adverse effect of glucocorticoids and synthetic analogues, initial encounter: Secondary | ICD-10-CM | POA: Insufficient documentation

## 2015-12-28 DIAGNOSIS — D591 Other autoimmune hemolytic anemias: Secondary | ICD-10-CM | POA: Diagnosis present

## 2015-12-28 DIAGNOSIS — D594 Other nonautoimmune hemolytic anemias: Secondary | ICD-10-CM

## 2015-12-28 DIAGNOSIS — I429 Cardiomyopathy, unspecified: Secondary | ICD-10-CM | POA: Insufficient documentation

## 2015-12-28 DIAGNOSIS — I4891 Unspecified atrial fibrillation: Secondary | ICD-10-CM | POA: Insufficient documentation

## 2015-12-28 DIAGNOSIS — Z7901 Long term (current) use of anticoagulants: Secondary | ICD-10-CM | POA: Insufficient documentation

## 2015-12-28 DIAGNOSIS — Z87891 Personal history of nicotine dependence: Secondary | ICD-10-CM | POA: Diagnosis not present

## 2015-12-28 DIAGNOSIS — I10 Essential (primary) hypertension: Secondary | ICD-10-CM | POA: Diagnosis not present

## 2015-12-28 LAB — CBC WITH DIFFERENTIAL/PLATELET
BASOS ABS: 0.1 10*3/uL (ref 0–0.1)
Basophils Relative: 1 %
Eosinophils Absolute: 0 10*3/uL (ref 0–0.7)
Eosinophils Relative: 0 %
HEMATOCRIT: 38.5 % — AB (ref 40.0–52.0)
Hemoglobin: 12.7 g/dL — ABNORMAL LOW (ref 13.0–18.0)
LYMPHS ABS: 0.6 10*3/uL — AB (ref 1.0–3.6)
LYMPHS PCT: 7 %
MCH: 31 pg (ref 26.0–34.0)
MCHC: 32.9 g/dL (ref 32.0–36.0)
MCV: 94.1 fL (ref 80.0–100.0)
MONO ABS: 0.5 10*3/uL (ref 0.2–1.0)
Monocytes Relative: 5 %
NEUTROS ABS: 8.1 10*3/uL — AB (ref 1.4–6.5)
Neutrophils Relative %: 87 %
Platelets: 180 10*3/uL (ref 150–440)
RBC: 4.09 MIL/uL — AB (ref 4.40–5.90)
RDW: 19.4 % — ABNORMAL HIGH (ref 11.5–14.5)
WBC: 9.4 10*3/uL (ref 3.8–10.6)

## 2015-12-28 LAB — LACTATE DEHYDROGENASE: LDH: 205 U/L — ABNORMAL HIGH (ref 98–192)

## 2015-12-29 ENCOUNTER — Telehealth: Payer: Self-pay | Admitting: *Deleted

## 2015-12-29 NOTE — Telephone Encounter (Signed)
-----   Message from Cammie Sickle, MD sent at 12/28/2015  9:56 PM EDT ----- Please inform patient to continue current dose of prednisone to 20 mg a day/hemoglobin stable.; continue CBC weekly.

## 2015-12-29 NOTE — Telephone Encounter (Signed)
Called patient to inform him that he should continue his 20 mg prednisone.  Hgb. Is stable.  Follow up with weekly CBC. Verbalized understanding.

## 2015-12-29 NOTE — Telephone Encounter (Signed)
Attempted to call patient.  No answer and no voice mail available.

## 2015-12-30 ENCOUNTER — Telehealth: Payer: Self-pay | Admitting: *Deleted

## 2015-12-30 ENCOUNTER — Other Ambulatory Visit: Payer: Self-pay | Admitting: Family Medicine

## 2015-12-30 DIAGNOSIS — D5919 Other autoimmune hemolytic anemia: Secondary | ICD-10-CM

## 2015-12-30 DIAGNOSIS — D591 Other autoimmune hemolytic anemias: Principal | ICD-10-CM

## 2015-12-30 MED ORDER — PREDNISONE 20 MG PO TABS: 20.0000 mg | ORAL_TABLET | Freq: Every day | ORAL | 0 refills | Status: DC

## 2015-12-30 NOTE — Telephone Encounter (Signed)
rx renewal for prednisone 20 mg daily x 30 days. No refills sent to patient's pharmacy per v/o Dr. Rogue Bussing.  Rodena Piety, please let pt know RX was sent to pharmacy.

## 2015-12-30 NOTE — Telephone Encounter (Signed)
We contacted patient yesterday to tell him to continue with his prednisone.  He called back stating he will need a refill.  Only has enough to get him through Tuesday.

## 2015-12-31 ENCOUNTER — Ambulatory Visit (INDEPENDENT_AMBULATORY_CARE_PROVIDER_SITE_OTHER): Payer: 59 | Admitting: Cardiovascular Disease

## 2015-12-31 ENCOUNTER — Inpatient Hospital Stay: Payer: 59

## 2015-12-31 ENCOUNTER — Telehealth: Payer: Self-pay | Admitting: *Deleted

## 2015-12-31 ENCOUNTER — Telehealth: Payer: Self-pay | Admitting: Cardiovascular Disease

## 2015-12-31 VITALS — BP 115/79 | HR 130 | Ht 70.0 in | Wt 212.5 lb

## 2015-12-31 DIAGNOSIS — R42 Dizziness and giddiness: Secondary | ICD-10-CM

## 2015-12-31 DIAGNOSIS — I43 Cardiomyopathy in diseases classified elsewhere: Secondary | ICD-10-CM

## 2015-12-31 DIAGNOSIS — J8410 Pulmonary fibrosis, unspecified: Secondary | ICD-10-CM

## 2015-12-31 DIAGNOSIS — D591 Other autoimmune hemolytic anemias: Secondary | ICD-10-CM | POA: Diagnosis not present

## 2015-12-31 DIAGNOSIS — D599 Acquired hemolytic anemia, unspecified: Secondary | ICD-10-CM

## 2015-12-31 DIAGNOSIS — I10 Essential (primary) hypertension: Secondary | ICD-10-CM | POA: Diagnosis not present

## 2015-12-31 DIAGNOSIS — R000 Tachycardia, unspecified: Secondary | ICD-10-CM | POA: Diagnosis not present

## 2015-12-31 DIAGNOSIS — D594 Other nonautoimmune hemolytic anemias: Secondary | ICD-10-CM

## 2015-12-31 DIAGNOSIS — I4892 Unspecified atrial flutter: Secondary | ICD-10-CM | POA: Diagnosis not present

## 2015-12-31 DIAGNOSIS — R0602 Shortness of breath: Secondary | ICD-10-CM | POA: Insufficient documentation

## 2015-12-31 LAB — CBC WITH DIFFERENTIAL/PLATELET
BASOS PCT: 0 %
Basophils Absolute: 0 10*3/uL (ref 0–0.1)
EOS ABS: 0 10*3/uL (ref 0–0.7)
EOS PCT: 0 %
HCT: 41.6 % (ref 40.0–52.0)
HEMOGLOBIN: 14 g/dL (ref 13.0–18.0)
LYMPHS ABS: 1.1 10*3/uL (ref 1.0–3.6)
Lymphocytes Relative: 11 %
MCH: 31.3 pg (ref 26.0–34.0)
MCHC: 33.6 g/dL (ref 32.0–36.0)
MCV: 93.3 fL (ref 80.0–100.0)
MONOS PCT: 5 %
Monocytes Absolute: 0.5 10*3/uL (ref 0.2–1.0)
NEUTROS PCT: 84 %
Neutro Abs: 8.6 10*3/uL — ABNORMAL HIGH (ref 1.4–6.5)
PLATELETS: 244 10*3/uL (ref 150–440)
RBC: 4.46 MIL/uL (ref 4.40–5.90)
RDW: 18.9 % — ABNORMAL HIGH (ref 11.5–14.5)
WBC: 10.2 10*3/uL (ref 3.8–10.6)

## 2015-12-31 LAB — LACTATE DEHYDROGENASE: LDH: 219 U/L — AB (ref 98–192)

## 2015-12-31 MED ORDER — AMIODARONE HCL 400 MG PO TABS: 400.0000 mg | ORAL_TABLET | Freq: Two times a day (BID) | ORAL | 2 refills | Status: DC

## 2015-12-31 MED ORDER — AMIODARONE HCL 200 MG PO TABS: 200.0000 mg | ORAL_TABLET | Freq: Two times a day (BID) | ORAL | 3 refills | Status: DC

## 2015-12-31 MED ORDER — RIVAROXABAN 20 MG PO TABS: 20.0000 mg | ORAL_TABLET | Freq: Every day | ORAL | 3 refills | Status: DC

## 2015-12-31 NOTE — Telephone Encounter (Signed)
Pt states his heart has been "racing" ever since he went back to work on Monday 7/31. States this morning it was racing so fast he felt like he was going to pass out, and experienced dizziness. States it was 135 at that time. States right now it is 58, he is sitting. States it fluctuates regardless of what he is doing, any time he is moving it starts racing. Please call.

## 2015-12-31 NOTE — Telephone Encounter (Signed)
Spoke w/ pt.  He reports that he got home from work this am, felt fine, HR 68-70. He felt like he was going to pass out, rechecked HR: 135. He is feeling fine now.  Pt is on Xarelto, but does not know dosage.  In the past he was on 20 mg.  He is [facility] way [facility] to have labs drawn and was advised to wait there for results.  Asked him to see if they can perform an EKG on him while he is there, and if not, to come here for one. He is agreeable and will let us know.

## 2015-12-31 NOTE — Telephone Encounter (Signed)
Please call to clarify amiodarone rx.

## 2015-12-31 NOTE — Patient Instructions (Signed)
Medication Instructions:   Please stay on metoprolol 75 twice a day Start diltiazem 30 mg three times a day  Start amiodarone 400 mg three times a day for 2 days Then 400 mg twice a day  Closely monitor your heart rate  If you get more lightheaded, or shortness of breath  Please go to the ER  Stay on xarelto one a day  We may need atrial flutter ablation  Labwork:  No new labs  Testing/Procedures:  No further testing   Follow-Up: It was a pleasure seeing you in the office today. Please call us if you have new issues that need to be addressed before your next appt.  [phone]  Your physician wants you to follow-up in:  we will contact you on Monday   If you need a refill on your cardiac medications before your next appointment, please call your pharmacy.

## 2015-12-31 NOTE — Progress Notes (Signed)
Patient ID: [name], male   DOB: 1960-10-16, 55 y.o.   MRN: [id number] Cardiology Office Note  Date:  12/31/2015   ID:  [name], DOB 12-29-1960, MRN [id number]  PCP:  Otilio Miu, MD   Chief Complaint  Patient presents with  . 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,   recent hospitalization for hemolytic anemia, shortness of breath Last seen in clinic several weeks ago, now presenting with acute onset of tachycardia and shortness of breath  He reports he was in his usual state of health, got home from work this morning,  entered the door to his house him a developed tachycardia, acute shortness of breath, general feeling of being unwell  Called our office and was told to come in for nurse visit He reports some fullness in the chest, more difficulty breathing Reports feeling fine yesterday Reports he started on his anticoagulation approximately  2 weeks ago  Was told he could restart anticoagulation by oncology recently as an outpatient   In the exam room today, EKG showing atrial flutter with ventricular rate 146 bpm  Other past medical history presented to the hospital 11/22/2015 with weakness, fall, hemoglobin of 5.5 Guiac negative, GI was consult and colonoscopy and EGD revealed no evidence of bleeding, Further evaluation by hematology confirmed hemolytic anemia, started on high-dose prednisone He did have transfusion 4  seen on a regular basis by Dr. Jefm Bryant for his mixed connective tissue  disease Last visit January 2017 per the notes  Previous 30 day monitor that showed 3 episodes of PVCs in a bigeminal manner. He was asymptomatic. Arrhythmia sometimes in the morning, other times in the early evening. Was not on a consistent basis.  admitted to [facility] in April of 2016 for 3 week history of increased dyspnea and palpitations and found to be in new onset atrial flutter.  TTE showed EF 20-25%, global HK, possible bicuspid aortic valve.  successful TEE/DCCV on 4/1. 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   Repeat TTE showed improved EF of 55-60%, select images suggestive of hypokinesis of the inferior and posterior myocardium. LV diastolic parameters were normal. Left atrium was normal in size. RV function was normal. PASP was normal. He was advised further work up if he has symptoms.   On 11/27/14 while at work he suddenly developed onset of palpitations that led to increased SOB, especially with ambulation causing patient to leave work early. Patient checked his pulse and found it to be in the 140's.Pulse went back into the 60's to 80's without intervention.  Recurrent chest pain 7/5 he was woken up around 2 AM with left shoulder pain that radiated to his left chest.  He presented to [facility] for evaluation. Troponin was found to be mildly elevated and flated trending 0.08-->0.05-->0.06-->0.07.  He underwent cardiac cath 48 hours which showed right dominant system, no significant CAD, normal LV gram with EF >55%  the patient reports hand sensitivity to cold which started about  2 years ago and has been worsening. His fingers turn blue and her with significant pain when he is exposed to cold weather. He usually does better in the summertime. He developed an ulceration on the tip of the left small finger about 3 months ago which has gradually worsened with early gangrenous  changes. No trauma.  he underwent upper extremity duplex which showed atypical flow in the ulnar artery and normal flow through the radial artery.  PMH:   has a past medical history of Atrial flutter (HCC); Gangrene of finger (HCC); History of nuclear stress test; HTN (hypertension); Normal coronary arteries; Pulmonary fibrosis (HCC); RA (rheumatoid arthritis) (HCC); and Tachycardia-induced cardiomyopathy (HCC).  PSH:    Past Surgical History:  Procedure Laterality Date  . CARDIAC CATHETERIZATION N/A 12/03/2014   Procedure: Left Heart Cath and Coronary Angiography;  Surgeon: Antonieta Iba, MD;  Location: [facility] INVASIVE CV LAB;  Service: Cardiovascular;  Laterality: N/A;  . COLONOSCOPY WITH PROPOFOL N/A 11/23/2015   Procedure: COLONOSCOPY WITH PROPOFOL;  Surgeon: Midge Minium, MD;  Location: [facility] ENDOSCOPY;  Service: Endoscopy;  Laterality: N/A;  . ESOPHAGOGASTRODUODENOSCOPY (EGD) WITH PROPOFOL N/A 11/23/2015   Procedure: ESOPHAGOGASTRODUODENOSCOPY (EGD) WITH PROPOFOL;  Surgeon: Midge Minium, MD;  Location: [facility] ENDOSCOPY;  Service: Endoscopy;  Laterality: N/A;  . PERIPHERAL VASCULAR CATHETERIZATION N/A 11/17/2015   Procedure: Upper Extremity Angiography;  Surgeon: Iran Ouch, MD;  Location: [facility] INVASIVE CV LAB;  Service: Cardiovascular;  Laterality: N/A;    Current Outpatient Prescriptions  Medication Sig Dispense Refill  . ADVAIR DISKUS 250-50 MCG/DOSE AEPB INHALE 1 DOSE BY MOUTH TWICE DAILY. RINSE MOUTH AFTER USE 1 each 0  . albuterol (PROVENTIL HFA;VENTOLIN HFA) 108 (90 Base) MCG/ACT inhaler Inhale 2 puffs into the lungs 4 (four) times daily as needed for wheezing or shortness of breath.    [name] amiodarone (PACERONE) 400 MG tablet Take 1 tablet (400 mg total) by mouth 2 (two) times daily. 60 tablet 2  . diltiazem (CARDIZEM) 30 MG tablet Take 1 tablet (30 mg total) by mouth 4 (four) times daily. Every 6 hours as needed for heart rate greater than 90 bpm 30 tablet 0  . enalapril (VASOTEC) 20  MG tablet Take 20 mg by mouth daily.    . fluticasone (FLONASE) 50 MCG/ACT nasal spray Place 2 sprays into both nostrils daily. 16 g 0  . hydrochlorothiazide (MICROZIDE) 12.5 MG capsule Take 1 capsule (12.5 mg total) by mouth daily.    . meloxicam (MOBIC) 15 MG tablet Take 1 tablet (15 mg total) by mouth daily. 30 tablet 0  . metoprolol (LOPRESSOR) 50 MG tablet Take 75 mg by mouth 2 (two) times daily.    . predniSONE (DELTASONE) 20 MG tablet Take 2 tablets (40 mg total) by mouth daily with breakfast. 14 tablet 0  . predniSONE (DELTASONE) 20 MG tablet Take 1 tablet (20 mg total) by mouth daily with breakfast. 30 tablet 0  . predniSONE (DELTASONE) 50 MG tablet Take 1 tablet (50 mg total) by mouth daily with breakfast. Take prednisone 90 mg daily. So take this along with other 40 mg. 15 tablet 0  . predniSONE (STERAPRED UNI-PAK 21 TAB) 10 MG (21) TBPK tablet Take 4 tablets (40 mg total) by mouth daily. Take along with 50 mg tablets - so he need to take 90 mg total prednisone daily. 63 tablet 0  . rivaroxaban (XARELTO) 20 MG TABS tablet Take 1 tablet (20 mg total) by mouth daily with supper. 90 tablet 3  . VENTOLIN  HFA 108 (90 Base) MCG/ACT inhaler INHALE 2 PUFFS BY MOUTH 4 TIMES DAILY 1 Inhaler 0   No current facility-administered medications for this visit.      Allergies:   Review of patient's allergies indicates no known allergies.   Social History:  The patient  reports that he quit smoking about 11 years ago. His smoking use included Cigarettes. He has a 20.00 pack-year smoking history. He has never used smokeless tobacco. He reports that he does not drink alcohol or use drugs.   Family History:   family history includes Leukemia in his mother.    Review of Systems: Review of Systems  Constitutional: Positive for malaise/fatigue.  Respiratory: Positive for shortness of breath.   Cardiovascular: Positive for palpitations.  Gastrointestinal: Negative.   Musculoskeletal: Negative.    Neurological: Negative.   Psychiatric/Behavioral: Negative.   All other systems reviewed and are negative.    PHYSICAL EXAM: VS:  BP 115/79   Pulse (!) 130   Ht 5\' 10"  (1.778 m)   Wt 212 lb 8 oz (96.4 kg)   BMI 30.49 kg/m  , BMI Body mass index is 30.49 kg/m. GEN: Well nourished, well developed, in no acute distress  HEENT: normal  Neck: no JVD, carotid bruits, or masses Cardiac: RRR;tachycardic  no murmurs, rubs, or gallops,no edema  Respiratory:  mostly Clear, normal work of breathing, scant rales at the bases GI: soft, nontender, nondistended, + BS MS: no deformity or atrophy  Skin: warm and dry, no rash Neuro:  Strength and sensation are intact Psych: euthymic mood, full affect    Recent Labs: 11/28/2015: ALT 26 11/29/2015: BUN 31; Creatinine, Ser 0.95; Magnesium 2.0; Potassium 4.2; Sodium 137 12/31/2015: Hemoglobin 14.0; Platelets 244    Lipid Panel Lab Results  Component Value Date   CHOL 88 08/25/2014   HDL 24 (L) 08/25/2014   LDLCALC 39 08/25/2014   TRIG 124 08/25/2014      Wt Readings from Last 3 Encounters:  12/31/15 212 lb 8 oz (96.4 kg)  12/07/15 213 lb 3 oz (96.7 kg)  12/03/15 213 lb 8 oz (96.8 kg)       ASSESSMENT AND PLAN:  Typical atrial flutter (HCC) - Plan: EKG 12-Lead He has been on anticoagulation for 2 weeks per the patient   acute onset of tachycardia, shortness of breath this morning EKG confirming atrial flutter with ventricular rate 140 bpm Long discussion with him concerning various treatment options for his atrial flutter He does not want to go to the emergency room, would prefer medical management -Suggested he take metoprolol 75 mill grams twice a day, Would take diltiazem 30 mill grams 3 times a day Would also start amiodarone 400 mg 3 times a day for the next 2 days then 400 mg twice a day -Recommended he come in for EKG Monday morning Strongly suggested if he has lightheadedness, shortness of breath, worsening malaise over the  weekend that he go to the emergency room for cardioversion -We did discuss flutter ablation. He will think about this  Other autoimmune hemolytic anemias (HCC) Previously treated with prednisone, had blood transfusion   anemia resolved  Hemorrhagic shock Recent hospitalization  hemoglobin 5.4 in the hospital on arrival  Pulmonary fibrosis [facility]) Previously seen by Dr. Raul Del  Rheumatoid arthritis, involving unspecified site, unspecified rheumatoid factor presence ([place]) Followed by Dr. Jefm Bryant, on high-dose prednisone  Occluded left ulnar artery Discovered by Dr. Fletcher Anon for nonhealing wound on his fingers Recommended referral back to Dr. Jefm Bryant  Long discussion concerning various treatment options for his acute atrial flutter with RVR Medications called in, arrangements made for close follow-up with EKG  Total encounter time more than 40 minutes  Greater than 50% was spent in counseling and coordination of care with the patient   Disposition:   Recommended he come in for EKG next week on Monday      Signed, Esmond Plants, M.D., Ph.D. 12/31/2015  [facility] [place], [place] [phone]

## 2015-12-31 NOTE — Telephone Encounter (Signed)
Called to report that when he came In from work this morning, hew felt very light headed and dizzy, it went away, but came back and he still feels "woozy"   Per Dr  Rogue Bussing, have him come in for CBC, LDH and wait for results. Patient agrees to come in at 1130 and wait

## 2015-12-31 NOTE — Telephone Encounter (Signed)
Dr. Rockey Situ spoke w/ pharmacy to clarify.

## 2015-12-31 NOTE — Telephone Encounter (Signed)
Pt presented the office for EKG. HR 146.  Dr. Rockey Situ evaluated pt.   Please see his office note.

## 2016-01-03 ENCOUNTER — Telehealth: Payer: Self-pay | Admitting: Cardiovascular Disease

## 2016-01-03 ENCOUNTER — Other Ambulatory Visit: Payer: Self-pay

## 2016-01-03 ENCOUNTER — Ambulatory Visit (INDEPENDENT_AMBULATORY_CARE_PROVIDER_SITE_OTHER): Payer: 59

## 2016-01-03 VITALS — BP 140/88 | HR 69 | Ht 70.0 in | Wt 212.0 lb

## 2016-01-03 DIAGNOSIS — I493 Ventricular premature depolarization: Secondary | ICD-10-CM | POA: Diagnosis not present

## 2016-01-03 DIAGNOSIS — I10 Essential (primary) hypertension: Secondary | ICD-10-CM

## 2016-01-03 DIAGNOSIS — I4892 Unspecified atrial flutter: Secondary | ICD-10-CM | POA: Diagnosis not present

## 2016-01-03 MED ORDER — AMIODARONE HCL 200 MG PO TABS: 200.0000 mg | ORAL_TABLET | Freq: Two times a day (BID) | ORAL | 5 refills | Status: DC

## 2016-01-03 NOTE — Telephone Encounter (Signed)
Per Dr. Fletcher Anon: "He is back in sinus rhythm. Continue Amiodarone 400 mg bid for another 2 days then down to 200 mg bid. "  Reviewed MD recommendations w/pt who verbalized understanding. Prescription for 200mg  amiodarone tablets submitted to pharmacy.

## 2016-01-03 NOTE — Progress Notes (Signed)
He is back in sinus rhythm. Continue Amiodarone 400 mg bid for another 2 days then down to 200 mg bid.

## 2016-01-03 NOTE — Telephone Encounter (Signed)
Called patient to inquire if he got his prednisone refill.  Patient states he picked it up on Friday.

## 2016-01-03 NOTE — Patient Instructions (Addendum)
1.) Reason for visit: EKG  2.) Name of MD requesting visit: Dr. Rockey Situ  3.) H&P: Pt presented to office 8/4 c/o SOB, tachycardia, and generally not feeling well. EKG showed aflutter, HR 140. Medications added: diltiazem 30mg  TID and amiodarone 400mg  TID x 2 days then decrease to BID. He was instructed to have repeat EKG in our office today.   4). ROS related to problem: Pt confirms he had taken diltiazem and amiodarone as prescribed. States he feels better but is concerned that per his FitBit, HR increases to 100-105 at work/with exertion. He has no other sx.  Advised pt that HR will increase with exertion and to continue to monitor and record HR and notify us if HR continues to increase and does not return to normal while resting.   4.) Assessment and plan per MD: Forward to Dr. Fletcher Anon to review and advise as Dr. Rockey Situ is out of the office.

## 2016-01-04 ENCOUNTER — Telehealth: Payer: Self-pay | Admitting: *Deleted

## 2016-01-04 ENCOUNTER — Inpatient Hospital Stay: Payer: 59

## 2016-01-04 ENCOUNTER — Other Ambulatory Visit: Payer: Self-pay | Admitting: Physician Assistant

## 2016-01-04 DIAGNOSIS — D591 Other autoimmune hemolytic anemias: Secondary | ICD-10-CM | POA: Diagnosis not present

## 2016-01-04 DIAGNOSIS — D594 Other nonautoimmune hemolytic anemias: Secondary | ICD-10-CM

## 2016-01-04 LAB — CBC WITH DIFFERENTIAL/PLATELET
BASOS ABS: 0.1 10*3/uL (ref 0–0.1)
Basophils Relative: 1 %
EOS ABS: 0.1 10*3/uL (ref 0–0.7)
HCT: 42.5 % (ref 40.0–52.0)
Hemoglobin: 13.9 g/dL (ref 13.0–18.0)
Lymphs Abs: 1.9 10*3/uL (ref 1.0–3.6)
MCH: 30.7 pg (ref 26.0–34.0)
MCHC: 32.8 g/dL (ref 32.0–36.0)
MCV: 93.7 fL (ref 80.0–100.0)
Monocytes Absolute: 1.1 10*3/uL — ABNORMAL HIGH (ref 0.2–1.0)
Monocytes Relative: 8 %
Neutro Abs: 10.1 10*3/uL — ABNORMAL HIGH (ref 1.4–6.5)
PLATELETS: 247 10*3/uL (ref 150–440)
RBC: 4.54 MIL/uL (ref 4.40–5.90)
RDW: 18.2 % — ABNORMAL HIGH (ref 11.5–14.5)
WBC: 13.3 10*3/uL — AB (ref 3.8–10.6)

## 2016-01-04 LAB — LACTATE DEHYDROGENASE: LDH: 193 U/L — AB (ref 98–192)

## 2016-01-04 NOTE — Telephone Encounter (Signed)
Instructed pt to continue same dose of prednisone. Pt states that he is one 20 mg daily.  I asked pt to keep all lab apts as scheduled. Teach back process performed.

## 2016-01-04 NOTE — Telephone Encounter (Signed)
-----   Message from Cammie Sickle, MD sent at 01/04/2016  2:18 PM EDT ----- Inform pt to continue current dose of prednisone; cont labs as ordered- Thx

## 2016-01-11 ENCOUNTER — Inpatient Hospital Stay: Payer: 59

## 2016-01-11 DIAGNOSIS — D594 Other nonautoimmune hemolytic anemias: Secondary | ICD-10-CM

## 2016-01-11 DIAGNOSIS — D591 Other autoimmune hemolytic anemias: Secondary | ICD-10-CM | POA: Diagnosis not present

## 2016-01-11 LAB — CBC WITH DIFFERENTIAL/PLATELET
BASOS PCT: 1 %
Basophils Absolute: 0.1 10*3/uL (ref 0–0.1)
EOS ABS: 0.1 10*3/uL (ref 0–0.7)
EOS PCT: 1 %
HCT: 41.4 % (ref 40.0–52.0)
Hemoglobin: 13.6 g/dL (ref 13.0–18.0)
LYMPHS PCT: 14 %
Lymphs Abs: 2 10*3/uL (ref 1.0–3.6)
MCH: 30.4 pg (ref 26.0–34.0)
MCHC: 32.8 g/dL (ref 32.0–36.0)
MCV: 92.6 fL (ref 80.0–100.0)
Monocytes Absolute: 0.8 10*3/uL (ref 0.2–1.0)
Monocytes Relative: 6 %
NEUTROS ABS: 11 10*3/uL — AB (ref 1.4–6.5)
NEUTROS PCT: 78 %
PLATELETS: 235 10*3/uL (ref 150–440)
RBC: 4.47 MIL/uL (ref 4.40–5.90)
RDW: 17.7 % — AB (ref 11.5–14.5)
WBC: 14 10*3/uL — ABNORMAL HIGH (ref 3.8–10.6)

## 2016-01-11 LAB — LACTATE DEHYDROGENASE: LDH: 203 U/L — ABNORMAL HIGH (ref 98–192)

## 2016-01-12 NOTE — Progress Notes (Signed)
Called patient and LVM that labs are okay.  Continue with prednisone and follow up with MD as scheduled for next week.

## 2016-01-18 ENCOUNTER — Encounter: Payer: Self-pay | Admitting: Internal Medicine

## 2016-01-18 ENCOUNTER — Inpatient Hospital Stay (HOSPITAL_BASED_OUTPATIENT_CLINIC_OR_DEPARTMENT_OTHER): Payer: 59 | Admitting: Internal Medicine

## 2016-01-18 ENCOUNTER — Inpatient Hospital Stay: Payer: 59

## 2016-01-18 VITALS — BP 125/81 | HR 58 | Temp 96.8°F | Resp 16 | Ht 70.0 in | Wt 217.4 lb

## 2016-01-18 DIAGNOSIS — I4891 Unspecified atrial fibrillation: Secondary | ICD-10-CM | POA: Diagnosis not present

## 2016-01-18 DIAGNOSIS — D591 Other autoimmune hemolytic anemias: Secondary | ICD-10-CM | POA: Diagnosis not present

## 2016-01-18 DIAGNOSIS — T380X5A Adverse effect of glucocorticoids and synthetic analogues, initial encounter: Secondary | ICD-10-CM

## 2016-01-18 DIAGNOSIS — Z7901 Long term (current) use of anticoagulants: Secondary | ICD-10-CM | POA: Diagnosis not present

## 2016-01-18 DIAGNOSIS — Z79899 Other long term (current) drug therapy: Secondary | ICD-10-CM

## 2016-01-18 DIAGNOSIS — D594 Other nonautoimmune hemolytic anemias: Secondary | ICD-10-CM

## 2016-01-18 DIAGNOSIS — G720 Drug-induced myopathy: Secondary | ICD-10-CM

## 2016-01-18 DIAGNOSIS — M359 Systemic involvement of connective tissue, unspecified: Secondary | ICD-10-CM | POA: Diagnosis not present

## 2016-01-18 LAB — CBC WITH DIFFERENTIAL/PLATELET
Basophils Absolute: 0.1 10*3/uL (ref 0–0.1)
EOS ABS: 0.1 10*3/uL (ref 0–0.7)
Eosinophils Relative: 1 %
HEMATOCRIT: 43.3 % (ref 40.0–52.0)
HEMOGLOBIN: 14.3 g/dL (ref 13.0–18.0)
LYMPHS ABS: 1.6 10*3/uL (ref 1.0–3.6)
Lymphocytes Relative: 11 %
MCH: 30.6 pg (ref 26.0–34.0)
MCHC: 33 g/dL (ref 32.0–36.0)
MCV: 92.5 fL (ref 80.0–100.0)
MONO ABS: 0.8 10*3/uL (ref 0.2–1.0)
Neutro Abs: 12 10*3/uL — ABNORMAL HIGH (ref 1.4–6.5)
Neutrophils Relative %: 81 %
Platelets: 186 10*3/uL (ref 150–440)
RBC: 4.68 MIL/uL (ref 4.40–5.90)
RDW: 17 % — AB (ref 11.5–14.5)
WBC: 14.7 10*3/uL — ABNORMAL HIGH (ref 3.8–10.6)

## 2016-01-18 LAB — LACTATE DEHYDROGENASE: LDH: 201 U/L — AB (ref 98–192)

## 2016-01-18 NOTE — Progress Notes (Signed)
Concerned mother had leukemia and if that has anything to do with his diagnosis and also concerned about prednisone dosing.  Pt also would like to know what caused this in the beginning.

## 2016-01-18 NOTE — Progress Notes (Signed)
[place] OFFICE PROGRESS NOTE  Patient Care Team: Juline Patch, MD as PCP - General (Family Medicine) Minna Merritts, MD as Consulting Physician (Cardiology)  No matching staging information was found for the patient.    No history exists.   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   # CONNECTIVE TISSUE/AUTOIMMUNE DISORDER [Dr.Kenodle] ; A.fib on xarelto; EGD/[place]-NEG [July 2017]  INTERVAL HISTORY:  [name] 55 y.o.  male pleasant patient above history of Autoimmune/connective tissue disorder; And recently diagnosed acute hemolytic anemia is here for follow-up. Patient is currently on prednisone 20 mg for the last 2-3 weeks.  Patient feels back to his baseline. His contract work.  He deenies any blood in stools black red stools. Appetite is good.   REVIEW OF SYSTEMS:  A complete 10 point review of system is done which is negative except mentioned above/history of present illness.   PAST MEDICAL HISTORY :  Past Medical History:  Diagnosis Date  . Atrial flutter ([place])    a. on Xarelto; b. s/p successful TEE/DCCV on 08/28/2014  . Gangrene of finger ([place])   . History of nuclear stress test    a. 09/02/2014: no sig ischemia, GI uptake noted, no sig WMA, EF 48%, no EKG chanes concerning for ischemia, low risk scan    . HTN (hypertension)   . Normal coronary arteries    a. cardiac cath 12/03/2014: no sigificant CAD, right dominant system, LVEF >55%, no MR or AS  . Pulmonary fibrosis ([place])    a. not on home oxygen  . RA (rheumatoid arthritis) ([place])   . Tachycardia-induced cardiomyopathy ([place])    a. echo 07/2014: EF 20-25%, cannot exclude atrial flutter,  global HK, possible bicuspid Ao valve, mod reduced RV sys fxn, mild-mod aortic valve sclerosis/calcification, mod TR, mildly elevated RVSP; b. TEE 08/28/2014: EF 30-35%, no intracardic thrombus, mildly dilated LA/RA, mild TR, mild-mod  aortic sclerosis w/o stenosis; c. echo 09/2014: EF 55-60%, select images w/ inf HK    PAST SURGICAL HISTORY :   Past Surgical History:  Procedure Laterality Date  . CARDIAC CATHETERIZATION N/A 12/03/2014   Procedure: Left Heart Cath and Coronary Angiography;  Surgeon: Minna Merritts, MD;  Location: [place] CV LAB;  Service: Cardiovascular;  Laterality: N/A;  . COLONOSCOPY WITH PROPOFOL N/A 11/23/2015   Procedure: COLONOSCOPY WITH PROPOFOL;  Surgeon: [name], MD;  Location: [facility] ENDOSCOPY;  Service: Endoscopy;  Laterality: N/A;  . ESOPHAGOGASTRODUODENOSCOPY (EGD) WITH PROPOFOL N/A 11/23/2015   Procedure: ESOPHAGOGASTRODUODENOSCOPY (EGD) WITH PROPOFOL;  Surgeon: [name], MD;  Location: [facility] ENDOSCOPY;  Service: Endoscopy;  Laterality: N/A;  . PERIPHERAL VASCULAR CATHETERIZATION N/A 11/17/2015   Procedure: Upper Extremity Angiography;  Surgeon: Wellington Hampshire, MD;  Location: [place] CV LAB;  Service: Cardiovascular;  Laterality: N/A;    FAMILY HISTORY :   Family History  Problem Relation Age of Onset  . Leukemia Mother     SOCIAL HISTORY:   Social History  Substance Use Topics  . Smoking status: Former Smoker    Packs/day: 1.00    Years: 20.00    Types: Cigarettes    Quit date: 08/30/2004  . Smokeless tobacco: Never Used  . Alcohol use No    ALLERGIES:  has No Known Allergies.  MEDICATIONS:  Current Outpatient Prescriptions  Medication Sig Dispense Refill  . ADVAIR DISKUS 250-50 MCG/DOSE AEPB INHALE 1 DOSE BY MOUTH TWICE DAILY. RINSE MOUTH AFTER USE  1 each 0  . albuterol (PROVENTIL HFA;VENTOLIN HFA) 108 (90 Base) MCG/ACT inhaler Inhale 2 puffs into the lungs 4 (four) times daily as needed for wheezing or shortness of breath.    [name] amiodarone (PACERONE) 200 MG tablet Take 1 tablet (200 mg total) by mouth 2 (two) times daily. 60 tablet 5  . diltiazem (CARDIZEM) 30 MG tablet TAKE 1 TABLET BY MOUTH EVERY 6 HOURS AS NEEDED FOR HEART RATE GREATER THAN 90 BPM 30 tablet 1   . enalapril (VASOTEC) 20 MG tablet Take 20 mg by mouth daily.    . fluticasone (FLONASE) 50 MCG/ACT nasal spray Place 2 sprays into both nostrils daily. 16 g 0  . hydrochlorothiazide (MICROZIDE) 12.5 MG capsule Take 1 capsule (12.5 mg total) by mouth daily.    . meloxicam (MOBIC) 15 MG tablet Take 1 tablet (15 mg total) by mouth daily. 30 tablet 0  . metoprolol (LOPRESSOR) 50 MG tablet Take 75 mg by mouth 2 (two) times daily.    . predniSONE (DELTASONE) 20 MG tablet Take 1 tablet (20 mg total) by mouth daily with breakfast. 30 tablet 0  . rivaroxaban (XARELTO) 20 MG TABS tablet Take 1 tablet (20 mg total) by mouth daily with supper. 90 tablet 3  . VENTOLIN HFA 108 (90 Base) MCG/ACT inhaler INHALE 2 PUFFS BY MOUTH 4 TIMES DAILY 1 Inhaler 0  . hydroxychloroquine (PLAQUENIL) 200 MG tablet      No current facility-administered medications for this visit.     PHYSICAL EXAMINATION:   BP 125/81 (BP Location: Left Arm, Patient Position: Sitting)   Pulse (!) 58   Temp (!) 96.8 F (36 C) (Tympanic)   Resp 16   Ht 5\' 10"  (1.778 m)   Wt 217 lb 6 oz (98.6 kg)   BMI 31.19 kg/m   Filed Weights   01/18/16 0953  Weight: 217 lb 6 oz (98.6 kg)    GENERAL: Well-nourished well-developed; Alert, no distress and comfortable.  Alone.  EYES: no pallor or icterus OROPHARYNX: no thrush or ulceration; good dentition  NECK: supple, no masses felt LYMPH:  no palpable lymphadenopathy in the cervical, axillary or inguinal regions LUNGS: clear to auscultation and  No wheeze or crackles HEART/CVS: regular rate & rhythm and no murmurs; No lower extremity edema ABDOMEN:abdomen soft, non-tender and normal bowel sounds Musculoskeletal:no cyanosis of digits and no clubbing  PSYCH: alert & oriented x 3 with fluent speech NEURO: no focal motor/sensory deficits SKIN:  no rashes or significant lesions  LABORATORY DATA:  I have reviewed the data as listed    Component Value Date/Time   NA 137 11/29/2015 0519    NA 138 12/11/2014 1518   NA 137 08/27/2014 0403   K 4.2 11/29/2015 0519   K 4.1 08/27/2014 0403   CL 111 11/29/2015 0519   CL 108 08/27/2014 0403   CO2 21 (L) 11/29/2015 0519   CO2 26 08/27/2014 0403   GLUCOSE 130 (H) 11/29/2015 0519   GLUCOSE 94 08/27/2014 0403   BUN 31 (H) 11/29/2015 0519   BUN 24 12/11/2014 1518   BUN 16 08/27/2014 0403   CREATININE 0.95 11/29/2015 0519   CREATININE 0.85 08/27/2014 0403   CALCIUM 9.1 11/29/2015 0519   CALCIUM 8.6 (L) 08/27/2014 0403   PROT 7.3 11/28/2015 0406   ALBUMIN 3.4 (L) 11/28/2015 0406   AST 25 11/28/2015 0406   ALT 26 11/28/2015 0406   ALKPHOS 56 11/28/2015 0406   BILITOT 1.5 (H) 11/28/2015 0406   GFRNONAA >60  11/29/2015 0519   GFRNONAA >60 08/27/2014 0403   GFRAA >60 11/29/2015 0519   GFRAA >60 08/27/2014 0403    No results found for: SPEP, UPEP  Lab Results  Component Value Date   WBC 14.7 (H) 01/18/2016   NEUTROABS 12.0 (H) 01/18/2016   HGB 14.3 01/18/2016   HCT 43.3 01/18/2016   MCV 92.5 01/18/2016   PLT 186 01/18/2016      Chemistry      Component Value Date/Time   NA 137 11/29/2015 0519   NA 138 12/11/2014 1518   NA 137 08/27/2014 0403   K 4.2 11/29/2015 0519   K 4.1 08/27/2014 0403   CL 111 11/29/2015 0519   CL 108 08/27/2014 0403   CO2 21 (L) 11/29/2015 0519   CO2 26 08/27/2014 0403   BUN 31 (H) 11/29/2015 0519   BUN 24 12/11/2014 1518   BUN 16 08/27/2014 0403   CREATININE 0.95 11/29/2015 0519   CREATININE 0.85 08/27/2014 0403      Component Value Date/Time   CALCIUM 9.1 11/29/2015 0519   CALCIUM 8.6 (L) 08/27/2014 0403   ALKPHOS 56 11/28/2015 0406   AST 25 11/28/2015 0406   ALT 26 11/28/2015 0406   BILITOT 1.5 (H) 11/28/2015 0406       RADIOGRAPHIC STUDIES: I have personally reviewed the radiological images as listed and agreed with the findings in the report. No results found.   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 Hb 14.3/ LDH- slightly up 201. Plan 20 pred/ for next 2 weeks; and then start taper further.   # History of A. Fib- I think it's okay to start taking Xarelto as patient does not have GI bleed.  # Steroid myopathy-improving.   # Patient follow-up with me in 6 weeks/ he understands that will be a slow taper of his prednisone.   Orders Placed This Encounter  Procedures  . CBC with Differential    Standing Status:   Standing    Number of Occurrences:   6    Standing Expiration Date:   01/17/2017  . Lactate dehydrogenase    Standing Status:   Standing    Number of Occurrences:   6    Standing Expiration Date:   01/17/2017   All questions were answered. The patient knows to call the clinic with any problems, questions or concerns.      Cammie Sickle, MD 01/18/2016 1:14 PM

## 2016-01-18 NOTE — Assessment & Plan Note (Signed)
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 Hb 14.3/ LDH- slightly up 201. Plan 20 pred/ for next 2 weeks; and then start taper further.   # History of A. Fib- I think it's okay to start taking Xarelto as patient does not have GI bleed.  # Steroid myopathy-improving.   # Patient follow-up with me in 6 weeks/ he understands that will be a slow taper of his prednisone.

## 2016-01-22 ENCOUNTER — Other Ambulatory Visit: Payer: Self-pay | Admitting: Family Medicine

## 2016-01-22 ENCOUNTER — Other Ambulatory Visit: Payer: Self-pay | Admitting: Cardiovascular Disease

## 2016-01-23 ENCOUNTER — Other Ambulatory Visit: Payer: Self-pay | Admitting: Internal Medicine

## 2016-01-23 DIAGNOSIS — D5919 Other autoimmune hemolytic anemia: Secondary | ICD-10-CM

## 2016-01-23 DIAGNOSIS — D591 Other autoimmune hemolytic anemias: Principal | ICD-10-CM

## 2016-01-25 ENCOUNTER — Inpatient Hospital Stay: Payer: 59

## 2016-01-25 ENCOUNTER — Other Ambulatory Visit: Payer: Self-pay

## 2016-01-25 DIAGNOSIS — D594 Other nonautoimmune hemolytic anemias: Secondary | ICD-10-CM

## 2016-01-25 DIAGNOSIS — D591 Other autoimmune hemolytic anemias: Secondary | ICD-10-CM | POA: Diagnosis not present

## 2016-01-25 LAB — CBC WITH DIFFERENTIAL/PLATELET
BASOS ABS: 0.1 10*3/uL (ref 0–0.1)
BASOS PCT: 1 %
Eosinophils Absolute: 0.2 10*3/uL (ref 0–0.7)
Eosinophils Relative: 2 %
HEMATOCRIT: 42.6 % (ref 40.0–52.0)
HEMOGLOBIN: 13.9 g/dL (ref 13.0–18.0)
LYMPHS PCT: 15 %
Lymphs Abs: 1.9 10*3/uL (ref 1.0–3.6)
MCH: 30 pg (ref 26.0–34.0)
MCHC: 32.7 g/dL (ref 32.0–36.0)
MCV: 91.8 fL (ref 80.0–100.0)
Monocytes Absolute: 0.8 10*3/uL (ref 0.2–1.0)
Monocytes Relative: 7 %
NEUTROS ABS: 9.5 10*3/uL — AB (ref 1.4–6.5)
NEUTROS PCT: 75 %
Platelets: 158 10*3/uL (ref 150–440)
RBC: 4.63 MIL/uL (ref 4.40–5.90)
RDW: 17 % — AB (ref 11.5–14.5)
WBC: 12.5 10*3/uL — AB (ref 3.8–10.6)

## 2016-01-25 LAB — LACTATE DEHYDROGENASE: LDH: 219 U/L — ABNORMAL HIGH (ref 98–192)

## 2016-01-27 ENCOUNTER — Encounter: Payer: Self-pay | Admitting: Family Medicine

## 2016-01-27 ENCOUNTER — Ambulatory Visit (INDEPENDENT_AMBULATORY_CARE_PROVIDER_SITE_OTHER): Payer: 59 | Admitting: Family Medicine

## 2016-01-27 VITALS — BP 120/78 | HR 80 | Ht 70.0 in | Wt 219.0 lb

## 2016-01-27 DIAGNOSIS — I10 Essential (primary) hypertension: Secondary | ICD-10-CM | POA: Diagnosis not present

## 2016-01-27 MED ORDER — HYDROCHLOROTHIAZIDE 25 MG PO TABS: ORAL_TABLET | ORAL | 2 refills | Status: DC

## 2016-01-27 NOTE — Progress Notes (Signed)
Name: [name]   MRN: [id number]    DOB: Sep 04, 1960   Date:01/27/2016       Progress Note  Subjective  Chief Complaint  Chief Complaint  Patient presents with  . Hypertension    only refill HCTZ  . COPD    refill inhalers    Hypertension  This is a chronic problem. The current episode started more than 1 year ago. The problem has been gradually improving since onset. The problem is controlled. Pertinent negatives include no anxiety, blurred vision, chest pain, headaches, malaise/fatigue, neck pain, orthopnea, palpitations, peripheral edema, PND, shortness of breath or sweats. There are no associated agents to hypertension. There are no known risk factors for coronary artery disease. Past treatments include ACE inhibitors, beta blockers, calcium channel blockers and diuretics. There are no compliance problems.  There is no history of angina, kidney disease, CAD/MI, CVA, heart failure, left ventricular hypertrophy, PVD, renovascular disease or retinopathy. There is no history of chronic renal disease or a hypertension causing med.    No problem-specific Assessment & Plan notes found for this encounter.   Past Medical History:  Diagnosis Date  . Atrial flutter ([place])    a. on Xarelto; b. s/p successful TEE/DCCV on 08/28/2014  . Gangrene of finger ([place])   . History of nuclear stress test    a. 09/02/2014: no sig ischemia, GI uptake noted, no sig WMA, EF 48%, no EKG chanes concerning for ischemia, low risk scan    . HTN (hypertension)   . Normal coronary arteries    a. cardiac cath 12/03/2014: no sigificant CAD, right dominant system, LVEF >55%, no MR or AS  . Pulmonary fibrosis ([place])    a. not on home oxygen  . RA (rheumatoid arthritis) ([place])   . 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 echo 07/2014: EF 20-25%, cannot exclude atrial flutter,  global HK, possible bicuspid Ao valve, mod reduced RV sys fxn, mild-mod aortic valve sclerosis/calcification, mod TR, mildly elevated  RVSP; b. TEE 08/28/2014: EF 30-35%, no intracardic thrombus, mildly dilated LA/RA, mild TR, mild-mod aortic sclerosis w/o stenosis; c. echo 09/2014: EF 55-60%, select images w/ inf HK    Past Surgical History:  Procedure Laterality Date  . CARDIAC CATHETERIZATION N/A 12/03/2014   Procedure: Left Heart Cath and Coronary Angiography;  Surgeon: Minna Merritts, MD;  Location: [facility];  Service: Cardiovascular;  Laterality: N/A;  . COLONOSCOPY WITH PROPOFOL N/A 11/23/2015   Procedure: COLONOSCOPY WITH PROPOFOL;  Surgeon: [name], MD;  Location: [facility];  Service: Endoscopy;  Laterality: N/A;  . ESOPHAGOGASTRODUODENOSCOPY (EGD) WITH PROPOFOL N/A 11/23/2015   Procedure: ESOPHAGOGASTRODUODENOSCOPY (EGD) WITH PROPOFOL;  Surgeon: [name], MD;  Location: [facility];  Service: Endoscopy;  Laterality: N/A;  . PERIPHERAL VASCULAR CATHETERIZATION N/A 11/17/2015   Procedure: Upper Extremity Angiography;  Surgeon: Wellington Hampshire, MD;  Location: [facility];  Service: Cardiovascular;  Laterality: N/A;    Family History  Problem Relation Age of Onset  . Leukemia Mother     Social History   Social History  . Marital status: Married    Spouse name: N/A  . Number of children: N/A  . Years of education: N/A   Occupational History  . Not on file.   Social History Main Topics  . Smoking status: Former Smoker    Packs/day: 1.00    Years: 20.00    Types: Cigarettes    Quit date: 08/30/2004  . Smokeless tobacco: Never Used  .  Alcohol use No  . Drug use: No  . Sexual activity: Not on file   Other Topics Concern  . Not on file   Social History Narrative  . No narrative on file    No Known Allergies   Review of Systems  Constitutional: Negative for chills, fever, malaise/fatigue and weight loss.  HENT: Negative for ear discharge, ear pain and sore throat.   Eyes: Negative for blurred vision.  Respiratory: Negative for cough, sputum production, shortness of  breath and wheezing.   Cardiovascular: Negative for chest pain, palpitations, orthopnea, leg swelling and PND.  Gastrointestinal: Negative for abdominal pain, blood in stool, constipation, diarrhea, heartburn, melena and nausea.  Genitourinary: Negative for dysuria, frequency, hematuria and urgency.  Musculoskeletal: Negative for back pain, joint pain, myalgias and neck pain.  Skin: Negative for rash.  Neurological: Negative for dizziness, tingling, sensory change, focal weakness and headaches.  Endo/Heme/Allergies: Negative for environmental allergies and polydipsia. Does not bruise/bleed easily.  Psychiatric/Behavioral: Negative for depression and suicidal ideas. The patient is not nervous/anxious and does not have insomnia.      Objective  Vitals:   01/27/16 0832  BP: 120/78  Pulse: 80  Weight: 219 lb (99.3 kg)  Height: 5\' 10"  (1.778 m)    Physical Exam  Constitutional: He is oriented to person, place, and time and well-developed, well-nourished, and in no distress.  HENT:  Head: Normocephalic.  Right Ear: External ear normal.  Left Ear: External ear normal.  Nose: Nose normal.  Mouth/Throat: Oropharynx is clear and moist.  Eyes: Conjunctivae and EOM are normal. Pupils are equal, round, and reactive to light. Right eye exhibits no discharge. Left eye exhibits no discharge. No scleral icterus.  Neck: Normal range of motion. Neck supple. No JVD present. No tracheal deviation present. No thyromegaly present.  Cardiovascular: Normal rate, regular rhythm, normal heart sounds and intact distal pulses.  Exam reveals no gallop and no friction rub.   No murmur heard. Pulmonary/Chest: Breath sounds normal. No respiratory distress. He has no wheezes. He has no rales.  Abdominal: Soft. Bowel sounds are normal. He exhibits no mass. There is no hepatosplenomegaly. There is no tenderness. There is no rebound, no guarding and no CVA tenderness.  Musculoskeletal: Normal range of motion. He  exhibits no edema or tenderness.  Lymphadenopathy:    He has no cervical adenopathy.  Neurological: He is alert and oriented to person, place, and time. He has normal sensation, normal strength and intact cranial nerves. No cranial nerve deficit.  Skin: Skin is warm. No rash noted.  Psychiatric: Mood and affect normal.  Nursing note and vitals reviewed.     Assessment & Plan  Problem List Items Addressed This Visit      Cardiovascular and Mediastinum   Essential hypertension - Primary (Chronic)   Relevant Medications   hydrochlorothiazide (HYDRODIURIL) 25 MG tablet    Other Visit Diagnoses   None.       Dr. [facility]  01/27/16

## 2016-02-01 ENCOUNTER — Telehealth: Payer: Self-pay | Admitting: *Deleted

## 2016-02-01 ENCOUNTER — Inpatient Hospital Stay: Payer: 59 | Attending: Internal Medicine

## 2016-02-01 DIAGNOSIS — D591 Other autoimmune hemolytic anemias: Secondary | ICD-10-CM | POA: Diagnosis not present

## 2016-02-01 DIAGNOSIS — Z79899 Other long term (current) drug therapy: Secondary | ICD-10-CM | POA: Insufficient documentation

## 2016-02-01 DIAGNOSIS — D594 Other nonautoimmune hemolytic anemias: Secondary | ICD-10-CM

## 2016-02-01 LAB — CBC WITH DIFFERENTIAL/PLATELET
Basophils Absolute: 0 10*3/uL (ref 0–0.1)
Basophils Relative: 0 %
Eosinophils Absolute: 0.1 10*3/uL (ref 0–0.7)
Eosinophils Relative: 1 %
HEMATOCRIT: 42.9 % (ref 40.0–52.0)
HEMOGLOBIN: 14.2 g/dL (ref 13.0–18.0)
LYMPHS ABS: 1.2 10*3/uL (ref 1.0–3.6)
LYMPHS PCT: 10 %
MCH: 30.1 pg (ref 26.0–34.0)
MCHC: 33.1 g/dL (ref 32.0–36.0)
MCV: 91 fL (ref 80.0–100.0)
Monocytes Absolute: 0.8 10*3/uL (ref 0.2–1.0)
Monocytes Relative: 7 %
NEUTROS ABS: 9.3 10*3/uL — AB (ref 1.4–6.5)
NEUTROS PCT: 82 %
Platelets: 139 10*3/uL — ABNORMAL LOW (ref 150–440)
RBC: 4.71 MIL/uL (ref 4.40–5.90)
RDW: 16.6 % — ABNORMAL HIGH (ref 11.5–14.5)
WBC: 11.4 10*3/uL — ABNORMAL HIGH (ref 3.8–10.6)

## 2016-02-01 LAB — LACTATE DEHYDROGENASE: LDH: 172 U/L (ref 98–192)

## 2016-02-01 NOTE — Telephone Encounter (Signed)
-----   Message from Cammie Sickle, MD sent at 02/01/2016  5:01 PM EDT ----- Please inform patient that hemoglobin is good at 14; continue prednisone at 20 mg a day at this time; and continue weekly labs/further instructions to follow.. I left message to talk to Dr.Kernodle re: other options.

## 2016-02-01 NOTE — Telephone Encounter (Signed)
Spoke with patient. New instructions provided to patient. Continue to keep 20 mg prednisone daily. Instructed to keep lab only appts as scheduled next week.  I informed pt that Dr. B has left a msg for Dr. Jefm Bryant to call him back.  Teach back process performed.

## 2016-02-04 ENCOUNTER — Encounter: Payer: Self-pay | Admitting: Internal Medicine

## 2016-02-04 ENCOUNTER — Ambulatory Visit (INDEPENDENT_AMBULATORY_CARE_PROVIDER_SITE_OTHER): Payer: 59 | Admitting: Internal Medicine

## 2016-02-04 VITALS — BP 142/86 | HR 62 | Temp 97.1°F | Resp 16 | Ht 70.0 in | Wt 222.6 lb

## 2016-02-04 DIAGNOSIS — J40 Bronchitis, not specified as acute or chronic: Secondary | ICD-10-CM

## 2016-02-04 DIAGNOSIS — I10 Essential (primary) hypertension: Secondary | ICD-10-CM

## 2016-02-04 MED ORDER — GUAIFENESIN-CODEINE 100-10 MG/5ML PO SYRP: 5.0000 mL | ORAL_SOLUTION | Freq: Three times a day (TID) | ORAL | 0 refills | Status: DC | PRN

## 2016-02-04 MED ORDER — AMOXICILLIN-POT CLAVULANATE 875-125 MG PO TABS: 1.0000 | ORAL_TABLET | Freq: Two times a day (BID) | ORAL | 0 refills | Status: DC

## 2016-02-04 NOTE — Progress Notes (Signed)
Date:  02/04/2016   Name:  [name]   DOB:  [birth year]   MRN:  [id number]   Chief Complaint: Cough (1 week) Cough  This is a new problem. The current episode started in the past 7 days. The cough is productive of purulent sputum. Associated symptoms include shortness of breath and wheezing. Pertinent negatives include no chest pain, chills, fever or headaches. He has tried oral steroids, a beta-agonist inhaler and steroid inhaler for the symptoms. His past medical history is significant for COPD.  Patient is on 20 mg of prednisone for hemolytic anemia. On Advair once a day. He has albuterol to use when necessary. Coughing up some thick green phlegm and sometimes blows similar discolored mucus from his nose.    Review of Systems  Constitutional: Negative for chills and fever.  Respiratory: Positive for cough, shortness of breath and wheezing.   Cardiovascular: Negative for chest pain and palpitations.  Gastrointestinal: Negative for abdominal pain.  Neurological: Negative for dizziness and headaches.    Patient Active Problem List   Diagnosis Date Noted  . Shortness of breath 12/31/2015  . 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 Hemorrhagic shock 11/25/2015  . Arrhythmia 11/25/2015  . Absolute anemia   . Weakness   . Hemolytic anemia ([place])   . Iron deficiency anemia, unspecified   . Hernia, hiatal   . Acute esophagitis   . 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 Normal coronary arteries   . Unstable angina - left-sided shoulder/chest pain occurring at rest 12/02/2014  . Chest pain 12/01/2014  . Typical atrial flutter ([place])   . 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 Essential hypertension   . RA (rheumatoid arthritis) ([place])   . Pulmonary fibrosis ([place])   . Atrial flutter ([place])     Prior to Admission medications   Medication Sig Start Date End Date Taking? Authorizing Provider  ADVAIR DISKUS 250-50 MCG/DOSE AEPB INHALE 1 DOSE BY MOUTH TWICE DAILY. RINSE  MOUTH AFTER USE 12/30/15  Yes Juline Patch, MD  albuterol (PROVENTIL HFA;VENTOLIN HFA) 108 (90 Base) MCG/ACT inhaler Inhale 2 puffs into the lungs 4 (four) times daily as needed for wheezing or shortness of breath.   Yes Historical Provider, MD  amiodarone (PACERONE) 200 MG tablet Take 1 tablet (200 mg total) by mouth 2 (two) times daily. 01/03/16  Yes Wellington Hampshire, MD  diltiazem (CARDIZEM) 30 MG tablet TAKE 1 TABLET BY MOUTH EVERY 6 HOURS AS NEEDED FOR HEART RATE GREATER THAN 90 BPM 01/04/16  Yes Minna Merritts, MD  enalapril (VASOTEC) 20 MG tablet Take 20 mg by mouth daily.   Yes Historical Provider, MD  fluticasone (FLONASE) 50 MCG/ACT nasal spray Place 2 sprays into both nostrils daily. 06/10/15  Yes William P Roemer, PA-C  hydrochlorothiazide (HYDRODIURIL) 25 MG tablet TAKE 1 TABLET BY MOUTH EVERY DAY. NEED APPT FOR REFILLS 01/27/16  Yes Juline Patch, MD  hydroxychloroquine (PLAQUENIL) 200 MG tablet  12/27/15  Yes Historical Provider, MD  meloxicam (MOBIC) 15 MG tablet Take 1 tablet (15 mg total) by mouth daily. 06/18/15  Yes Titorya Stover, DPM  metoprolol (LOPRESSOR) 50 MG tablet Take 75 mg by mouth 2 (two) times daily.   Yes Historical Provider, MD  predniSONE (DELTASONE) 20 MG tablet TAKE 1 TABLET (20 MG TOTAL) BY MOUTH DAILY WITH BREAKFAST. 01/24/16  Yes Cammie Sickle, MD  rivaroxaban (XARELTO) 20 MG TABS tablet Take 1 tablet (20 mg total) by mouth daily with supper. 12/31/15  Yes Minna Merritts, MD    No Known Allergies  Past Surgical History:  Procedure Laterality Date  . CARDIAC CATHETERIZATION N/A 12/03/2014   Procedure: Left Heart Cath and Coronary Angiography;  Surgeon: Minna Merritts, MD;  Location: [facility];  Service: Cardiovascular;  Laterality: N/A;  . COLONOSCOPY WITH PROPOFOL N/A 11/23/2015   Procedure: COLONOSCOPY WITH PROPOFOL;  Surgeon: [name], MD;  Location: [facility];  Service: Endoscopy;  Laterality: N/A;  . ESOPHAGOGASTRODUODENOSCOPY (EGD)  WITH PROPOFOL N/A 11/23/2015   Procedure: ESOPHAGOGASTRODUODENOSCOPY (EGD) WITH PROPOFOL;  Surgeon: [name], MD;  Location: [facility];  Service: Endoscopy;  Laterality: N/A;  . PERIPHERAL VASCULAR CATHETERIZATION N/A 11/17/2015   Procedure: Upper Extremity Angiography;  Surgeon: Wellington Hampshire, MD;  Location: [facility];  Service: Cardiovascular;  Laterality: N/A;    Social History  Substance Use Topics  . Smoking status: Former Smoker    Packs/day: 1.00    Years: 20.00    Types: Cigarettes    Quit date: 08/30/2004  . Smokeless tobacco: Never Used  . Alcohol use No     Medication list has been reviewed and updated.   Physical Exam  Constitutional: He is oriented to person, place, and time. He appears well-developed. No distress.  HENT:  Head: Normocephalic and atraumatic.  Right Ear: Tympanic membrane and ear canal normal.  Left Ear: Tympanic membrane and ear canal normal.  Nose: Right sinus exhibits no maxillary sinus tenderness. Left sinus exhibits no maxillary sinus tenderness.  Mouth/Throat: Uvula is midline and oropharynx is clear and moist.  Cardiovascular: Normal rate, regular rhythm and normal heart sounds.   Pulmonary/Chest: Effort normal. No respiratory distress. He has decreased breath sounds. He has no wheezes. He has no rhonchi.  Musculoskeletal: Normal range of motion.  Neurological: He is alert and oriented to person, place, and time.  Skin: Skin is warm and dry. No rash noted.  Psychiatric: He has a normal mood and affect. His behavior is normal. Thought content normal.  Nursing note and vitals reviewed.   BP (!) 142/86   Pulse 62   Temp 97.1 F (36.2 C)   Resp 16   Ht 5\' 10"  (1.778 m)   Wt 222 lb 9.6 oz (101 kg)   SpO2 94%   BMI 31.94 kg/m   Assessment and Plan: 1. Bronchitis Continue Advair and albuterol - amoxicillin-clavulanate (AUGMENTIN) 875-125 MG tablet; Take 1 tablet by mouth 2 (two) times daily.  Dispense: 20 tablet; Refill:  0 - guaiFENesin-codeine (ROBITUSSIN AC) 100-10 MG/5ML syrup; Take 5 mLs by mouth 3 (three) times daily as needed for cough.  Dispense: 150 mL; Refill: 0  2. Essential hypertension controlled   Halina Maidens, MD [facility]  02/04/2016

## 2016-02-07 ENCOUNTER — Other Ambulatory Visit: Payer: Self-pay | Admitting: Cardiovascular Disease

## 2016-02-08 ENCOUNTER — Telehealth: Payer: Self-pay

## 2016-02-08 ENCOUNTER — Telehealth: Payer: Self-pay | Admitting: *Deleted

## 2016-02-08 ENCOUNTER — Inpatient Hospital Stay: Payer: 59

## 2016-02-08 DIAGNOSIS — D591 Other autoimmune hemolytic anemias: Secondary | ICD-10-CM | POA: Diagnosis not present

## 2016-02-08 DIAGNOSIS — D594 Other nonautoimmune hemolytic anemias: Secondary | ICD-10-CM

## 2016-02-08 LAB — CBC WITH DIFFERENTIAL/PLATELET
Basophils Absolute: 0.1 10*3/uL (ref 0–0.1)
Basophils Relative: 1 %
Eosinophils Absolute: 0.1 10*3/uL (ref 0–0.7)
HCT: 42.8 % (ref 40.0–52.0)
Hemoglobin: 14.1 g/dL (ref 13.0–18.0)
LYMPHS ABS: 2 10*3/uL (ref 1.0–3.6)
MCH: 29.5 pg (ref 26.0–34.0)
MCHC: 32.9 g/dL (ref 32.0–36.0)
MCV: 89.8 fL (ref 80.0–100.0)
MONO ABS: 0.6 10*3/uL (ref 0.2–1.0)
Monocytes Relative: 5 %
Neutro Abs: 8.2 10*3/uL — ABNORMAL HIGH (ref 1.4–6.5)
Neutrophils Relative %: 75 %
PLATELETS: 146 10*3/uL — AB (ref 150–440)
RBC: 4.77 MIL/uL (ref 4.40–5.90)
RDW: 15.6 % — AB (ref 11.5–14.5)
WBC: 11 10*3/uL — ABNORMAL HIGH (ref 3.8–10.6)

## 2016-02-08 LAB — LACTATE DEHYDROGENASE: LDH: 326 U/L — ABNORMAL HIGH (ref 98–192)

## 2016-02-08 NOTE — Telephone Encounter (Signed)
Contacted patient at 1408 to review his labs from today.  Left msg to inform patient to contact our office back to discuss his labs results and prednisone dosing.     Informed Dr. Jacinto Reap. That at last phone call, I recorded that patient is taking 20 mg of  prednisone daily.  md per md, pt should keep taking 20 mg of prednisone daily.

## 2016-02-08 NOTE — Telephone Encounter (Signed)
-----   Message from Cammie Sickle, MD sent at 02/08/2016  1:32 PM EDT ----- Please inform the patient to continue the current dose of prednisone [please confirm the dose]. Weekly labs.

## 2016-02-08 NOTE — Telephone Encounter (Signed)
Pt advised that per Dr. B to please remain on same prednisone dose of 20 mg.  Pt verbalized an understanding went over platelet count with pt. No other concerns noted

## 2016-02-15 ENCOUNTER — Inpatient Hospital Stay: Payer: 59

## 2016-02-15 DIAGNOSIS — D591 Other autoimmune hemolytic anemias: Secondary | ICD-10-CM | POA: Diagnosis not present

## 2016-02-15 DIAGNOSIS — D594 Other nonautoimmune hemolytic anemias: Secondary | ICD-10-CM

## 2016-02-15 LAB — CBC WITH DIFFERENTIAL/PLATELET
BASOS ABS: 0.1 10*3/uL (ref 0–0.1)
BASOS PCT: 1 %
Eosinophils Absolute: 0.1 10*3/uL (ref 0–0.7)
Eosinophils Relative: 1 %
HEMATOCRIT: 44.6 % (ref 40.0–52.0)
HEMOGLOBIN: 14.6 g/dL (ref 13.0–18.0)
Lymphocytes Relative: 13 %
Lymphs Abs: 1.5 10*3/uL (ref 1.0–3.6)
MCH: 29.7 pg (ref 26.0–34.0)
MCHC: 32.8 g/dL (ref 32.0–36.0)
MCV: 90.6 fL (ref 80.0–100.0)
MONOS PCT: 5 %
Monocytes Absolute: 0.6 10*3/uL (ref 0.2–1.0)
NEUTROS ABS: 9 10*3/uL — AB (ref 1.4–6.5)
NEUTROS PCT: 80 %
Platelets: 193 10*3/uL (ref 150–440)
RBC: 4.93 MIL/uL (ref 4.40–5.90)
RDW: 15.7 % — ABNORMAL HIGH (ref 11.5–14.5)
WBC: 11.3 10*3/uL — ABNORMAL HIGH (ref 3.8–10.6)

## 2016-02-15 LAB — LACTATE DEHYDROGENASE: LDH: 224 U/L — ABNORMAL HIGH (ref 98–192)

## 2016-02-16 ENCOUNTER — Telehealth: Payer: Self-pay | Admitting: *Deleted

## 2016-02-16 NOTE — Telephone Encounter (Signed)
-----   Message from Cammie Sickle, MD sent at 02/15/2016  8:26 PM EDT ----- Continue current dose of prednisone; follow-up/labs as planned. Please inform pt. Thx

## 2016-02-16 NOTE — Telephone Encounter (Signed)
Contacted patient. Left vm for patient to continue same dose of prednisone and to keep all sch. appts.

## 2016-02-19 ENCOUNTER — Other Ambulatory Visit: Payer: Self-pay | Admitting: Internal Medicine

## 2016-02-19 DIAGNOSIS — D591 Other autoimmune hemolytic anemias: Principal | ICD-10-CM

## 2016-02-19 DIAGNOSIS — D5919 Other autoimmune hemolytic anemia: Secondary | ICD-10-CM

## 2016-02-22 ENCOUNTER — Inpatient Hospital Stay: Payer: 59

## 2016-02-22 DIAGNOSIS — D594 Other nonautoimmune hemolytic anemias: Secondary | ICD-10-CM

## 2016-02-22 DIAGNOSIS — D591 Other autoimmune hemolytic anemias: Secondary | ICD-10-CM | POA: Diagnosis not present

## 2016-02-22 LAB — LACTATE DEHYDROGENASE: LDH: 227 U/L — ABNORMAL HIGH (ref 98–192)

## 2016-02-22 LAB — CBC WITH DIFFERENTIAL/PLATELET
BASOS ABS: 0.2 10*3/uL — AB (ref 0–0.1)
BASOS PCT: 1 %
Eosinophils Absolute: 0.1 10*3/uL (ref 0–0.7)
Eosinophils Relative: 1 %
HEMATOCRIT: 44.8 % (ref 40.0–52.0)
Hemoglobin: 14.7 g/dL (ref 13.0–18.0)
LYMPHS PCT: 10 %
Lymphs Abs: 1.6 10*3/uL (ref 1.0–3.6)
MCH: 29.2 pg (ref 26.0–34.0)
MCHC: 32.7 g/dL (ref 32.0–36.0)
MCV: 89.3 fL (ref 80.0–100.0)
MONO ABS: 0.9 10*3/uL (ref 0.2–1.0)
Monocytes Relative: 6 %
NEUTROS ABS: 13.6 10*3/uL — AB (ref 1.4–6.5)
Neutrophils Relative %: 82 %
PLATELETS: 223 10*3/uL (ref 150–440)
RBC: 5.02 MIL/uL (ref 4.40–5.90)
RDW: 15.4 % — AB (ref 11.5–14.5)
WBC: 16.4 10*3/uL — AB (ref 3.8–10.6)

## 2016-02-29 ENCOUNTER — Inpatient Hospital Stay: Payer: 59

## 2016-02-29 ENCOUNTER — Encounter: Payer: Self-pay | Admitting: Internal Medicine

## 2016-02-29 ENCOUNTER — Inpatient Hospital Stay: Payer: 59 | Attending: Internal Medicine | Admitting: Internal Medicine

## 2016-02-29 VITALS — BP 115/74 | HR 57 | Temp 97.2°F | Resp 17 | Ht 70.0 in | Wt 224.4 lb

## 2016-02-29 DIAGNOSIS — D591 Other autoimmune hemolytic anemias: Secondary | ICD-10-CM | POA: Diagnosis not present

## 2016-02-29 DIAGNOSIS — I429 Cardiomyopathy, unspecified: Secondary | ICD-10-CM | POA: Diagnosis not present

## 2016-02-29 DIAGNOSIS — M069 Rheumatoid arthritis, unspecified: Secondary | ICD-10-CM | POA: Insufficient documentation

## 2016-02-29 DIAGNOSIS — G720 Drug-induced myopathy: Secondary | ICD-10-CM | POA: Diagnosis not present

## 2016-02-29 DIAGNOSIS — Z7901 Long term (current) use of anticoagulants: Secondary | ICD-10-CM

## 2016-02-29 DIAGNOSIS — Z79899 Other long term (current) drug therapy: Secondary | ICD-10-CM | POA: Diagnosis not present

## 2016-02-29 DIAGNOSIS — T380X5A Adverse effect of glucocorticoids and synthetic analogues, initial encounter: Secondary | ICD-10-CM | POA: Insufficient documentation

## 2016-02-29 DIAGNOSIS — D594 Other nonautoimmune hemolytic anemias: Secondary | ICD-10-CM

## 2016-02-29 DIAGNOSIS — Z87891 Personal history of nicotine dependence: Secondary | ICD-10-CM | POA: Insufficient documentation

## 2016-02-29 DIAGNOSIS — I10 Essential (primary) hypertension: Secondary | ICD-10-CM | POA: Diagnosis not present

## 2016-02-29 DIAGNOSIS — I4891 Unspecified atrial fibrillation: Secondary | ICD-10-CM | POA: Insufficient documentation

## 2016-02-29 LAB — CBC WITH DIFFERENTIAL/PLATELET
BASOS ABS: 0.2 10*3/uL — AB (ref 0–0.1)
BASOS PCT: 1 %
EOS PCT: 1 %
Eosinophils Absolute: 0.2 10*3/uL (ref 0–0.7)
HCT: 44.3 % (ref 40.0–52.0)
Hemoglobin: 14.4 g/dL (ref 13.0–18.0)
Lymphocytes Relative: 16 %
Lymphs Abs: 2.5 10*3/uL (ref 1.0–3.6)
MCH: 28.9 pg (ref 26.0–34.0)
MCHC: 32.6 g/dL (ref 32.0–36.0)
MCV: 88.7 fL (ref 80.0–100.0)
MONO ABS: 1.2 10*3/uL — AB (ref 0.2–1.0)
MONOS PCT: 8 %
Neutro Abs: 11.3 10*3/uL — ABNORMAL HIGH (ref 1.4–6.5)
Neutrophils Relative %: 74 %
PLATELETS: 206 10*3/uL (ref 150–440)
RBC: 5 MIL/uL (ref 4.40–5.90)
RDW: 15.2 % — AB (ref 11.5–14.5)
WBC: 15.4 10*3/uL — ABNORMAL HIGH (ref 3.8–10.6)

## 2016-02-29 LAB — LACTATE DEHYDROGENASE: LDH: 231 U/L — ABNORMAL HIGH (ref 98–192)

## 2016-02-29 NOTE — Progress Notes (Signed)
Pt's concern is when or if he will come off of prednisone?

## 2016-02-29 NOTE — Assessment & Plan Note (Addendum)
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  Plan 20 prednisone once a day/ taper defer to Dr.Kernodle. Discussed with Dr.Kernodle- prednisone could be tapered off completely from hematology standpoint. However as per my discussion patient needs some prednisone for his rheumatologic problems. Also discussed with Dr.kernodle- regarding use of Imuran or CellCept is okay from hematology standpoint- as this could also help his secondary hemolytic anemia. Patient again has the appointment with rheumatology end of this month  # History of A. Fib- I think it's okay to start taking Xarelto as patient does not have GI bleed.  # Steroid myopathy-improving.   Labs in 3 week/ follow up with me in 6 weeks/labs.

## 2016-02-29 NOTE — Progress Notes (Signed)
[facility] OFFICE PROGRESS NOTE  Patient Care Team: Juline Patch, MD as PCP - General (Family Medicine) Minna Merritts, MD as Consulting Physician (Cardiology)  No matching staging information was found for the patient.    No history exists.   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   # CONNECTIVE TISSUE/AUTOIMMUNE DISORDER [Dr.Kenodle] ; A.fib on xarelto; EGD/[facility]-NEG [July 2017]  INTERVAL HISTORY:  [name] 55 y.o.  male pleasant patient above history of Autoimmune/connective tissue disorder; And recently diagnosed acute hemolytic anemia is here for follow-up. Patient is currently on prednisone 20 mg.   Patient feels back to his baseline. He stated that he has been eating more than usual- attributed to steroids. Weight gain. Denies any muscle weakness. No difficulty swallowing pain with swallowing.  REVIEW OF SYSTEMS:  A complete 10 point review of system is done which is negative except mentioned above/history of present illness.   PAST MEDICAL HISTORY :  Past Medical History:  Diagnosis Date  . Atrial flutter ([place])    a. on Xarelto; b. s/p successful TEE/DCCV on 08/28/2014  . Gangrene of finger ([place])   . History of nuclear stress test    a. 09/02/2014: no sig ischemia, GI uptake noted, no sig WMA, EF 48%, no EKG chanes concerning for ischemia, low risk scan    . HTN (hypertension)   . Normal coronary arteries    a. cardiac cath 12/03/2014: no sigificant CAD, right dominant system, LVEF >55%, no MR or AS  . Pulmonary fibrosis ([place])    a. not on home oxygen  . RA (rheumatoid arthritis) ([place])   . 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 echo 07/2014: EF 20-25%, cannot exclude atrial flutter,  global HK, possible bicuspid Ao valve, mod reduced RV sys fxn, mild-mod aortic valve sclerosis/calcification, mod TR, mildly elevated RVSP; b. TEE 08/28/2014: EF 30-35%, no  intracardic thrombus, mildly dilated LA/RA, mild TR, mild-mod aortic sclerosis w/o stenosis; c. echo 09/2014: EF 55-60%, select images w/ inf HK    PAST SURGICAL HISTORY :   Past Surgical History:  Procedure Laterality Date  . CARDIAC CATHETERIZATION N/A 12/03/2014   Procedure: Left Heart Cath and Coronary Angiography;  Surgeon: Minna Merritts, MD;  Location: [facility];  Service: Cardiovascular;  Laterality: N/A;  . COLONOSCOPY WITH PROPOFOL N/A 11/23/2015   Procedure: COLONOSCOPY WITH PROPOFOL;  Surgeon: [name], MD;  Location: [facility];  Service: Endoscopy;  Laterality: N/A;  . ESOPHAGOGASTRODUODENOSCOPY (EGD) WITH PROPOFOL N/A 11/23/2015   Procedure: ESOPHAGOGASTRODUODENOSCOPY (EGD) WITH PROPOFOL;  Surgeon: [name], MD;  Location: [facility];  Service: Endoscopy;  Laterality: N/A;  . PERIPHERAL VASCULAR CATHETERIZATION N/A 11/17/2015   Procedure: Upper Extremity Angiography;  Surgeon: Wellington Hampshire, MD;  Location: [facility];  Service: Cardiovascular;  Laterality: N/A;    FAMILY HISTORY :   Family History  Problem Relation Age of Onset  . Leukemia Mother     SOCIAL HISTORY:   Social History  Substance Use Topics  . Smoking status: Former Smoker    Packs/day: 1.00    Years: 20.00    Types: Cigarettes    Quit date: 08/30/2004  . Smokeless tobacco: Never Used  . Alcohol use No    ALLERGIES:  has No Known Allergies.  MEDICATIONS:  Current Outpatient Prescriptions  Medication Sig Dispense Refill  . ADVAIR DISKUS 250-50 MCG/DOSE AEPB INHALE 1 DOSE BY MOUTH TWICE DAILY.  RINSE MOUTH AFTER USE 1 each 0  . albuterol (PROVENTIL HFA;VENTOLIN HFA) 108 (90 Base) MCG/ACT inhaler Inhale 2 puffs into the lungs 4 (four) times daily as needed for wheezing or shortness of breath.    [name] amiodarone (PACERONE) 200 MG tablet Take 1 tablet (200 mg total) by mouth 2 (two) times daily. 60 tablet 5  . diltiazem (CARDIZEM) 30 MG tablet Take 1 tablet (30 mg total) by  mouth 3 (three) times daily. 90 tablet 3  . enalapril (VASOTEC) 20 MG tablet Take 20 mg by mouth daily.    . fluticasone (FLONASE) 50 MCG/ACT nasal spray Place 2 sprays into both nostrils daily. 16 g 0  . hydrochlorothiazide (HYDRODIURIL) 25 MG tablet TAKE 1 TABLET BY MOUTH EVERY DAY. NEED APPT FOR REFILLS 90 tablet 2  . hydroxychloroquine (PLAQUENIL) 200 MG tablet     . meloxicam (MOBIC) 15 MG tablet Take 1 tablet (15 mg total) by mouth daily. 30 tablet 0  . metoprolol (LOPRESSOR) 50 MG tablet Take 75 mg by mouth 2 (two) times daily.    . predniSONE (DELTASONE) 20 MG tablet TAKE 1 TABLET (20 MG TOTAL) BY MOUTH DAILY WITH BREAKFAST. 30 tablet 0  . rivaroxaban (XARELTO) 20 MG TABS tablet Take 1 tablet (20 mg total) by mouth daily with supper. 90 tablet 3  . amLODipine (NORVASC) 5 MG tablet TAKE 1 TABLET (5 MG TOTAL) BY MOUTH ONCE DAILY.  11  . mycophenolate (CELLCEPT) 500 MG tablet Take 500 mg by mouth 2 (two) times daily.  11   No current facility-administered medications for this visit.     PHYSICAL EXAMINATION:   BP 115/74 (BP Location: Left Arm, Patient Position: Sitting)   Pulse (!) 57   Temp 97.2 F (36.2 C) (Tympanic)   Resp 17   Ht 5\' 10"  (1.778 m)   Wt 224 lb 6.9 oz (101.8 kg)   BMI 32.20 kg/m   Filed Weights   02/29/16 0945  Weight: 224 lb 6.9 oz (101.8 kg)    GENERAL: Well-nourished well-developed; Alert, no distress and comfortable.  Alone.  EYES: no pallor or icterus OROPHARYNX: no thrush or ulceration; good dentition  NECK: supple, no masses felt LYMPH:  no palpable lymphadenopathy in the cervical, axillary or inguinal regions LUNGS: clear to auscultation and  No wheeze or crackles HEART/CVS: regular rate & rhythm and no murmurs; No lower extremity edema ABDOMEN:abdomen soft, non-tender and normal bowel sounds Musculoskeletal:no cyanosis of digits and no clubbing  PSYCH: alert & oriented x 3 with fluent speech NEURO: no focal motor/sensory deficits SKIN:  no  rashes or significant lesions  LABORATORY DATA:  I have reviewed the data as listed    Component Value Date/Time   NA 137 11/29/2015 0519   NA 138 12/11/2014 1518   NA 137 08/27/2014 0403   K 4.2 11/29/2015 0519   K 4.1 08/27/2014 0403   CL 111 11/29/2015 0519   CL 108 08/27/2014 0403   CO2 21 (L) 11/29/2015 0519   CO2 26 08/27/2014 0403   GLUCOSE 130 (H) 11/29/2015 0519   GLUCOSE 94 08/27/2014 0403   BUN 31 (H) 11/29/2015 0519   BUN 24 12/11/2014 1518   BUN 16 08/27/2014 0403   CREATININE 0.95 11/29/2015 0519   CREATININE 0.85 08/27/2014 0403   CALCIUM 9.1 11/29/2015 0519   CALCIUM 8.6 (L) 08/27/2014 0403   PROT 7.3 11/28/2015 0406   ALBUMIN 3.4 (L) 11/28/2015 0406   AST 25 11/28/2015 0406   ALT 26 11/28/2015  0406   ALKPHOS 56 11/28/2015 0406   BILITOT 1.5 (H) 11/28/2015 0406   GFRNONAA >60 11/29/2015 0519   GFRNONAA >60 08/27/2014 0403   GFRAA >60 11/29/2015 0519   GFRAA >60 08/27/2014 0403    No results found for: SPEP, UPEP  Lab Results  Component Value Date   WBC 15.4 (H) 02/29/2016   NEUTROABS 11.3 (H) 02/29/2016   HGB 14.4 02/29/2016   HCT 44.3 02/29/2016   MCV 88.7 02/29/2016   PLT 206 02/29/2016      Chemistry      Component Value Date/Time   NA 137 11/29/2015 0519   NA 138 12/11/2014 1518   NA 137 08/27/2014 0403   K 4.2 11/29/2015 0519   K 4.1 08/27/2014 0403   CL 111 11/29/2015 0519   CL 108 08/27/2014 0403   CO2 21 (L) 11/29/2015 0519   CO2 26 08/27/2014 0403   BUN 31 (H) 11/29/2015 0519   BUN 24 12/11/2014 1518   BUN 16 08/27/2014 0403   CREATININE 0.95 11/29/2015 0519   CREATININE 0.85 08/27/2014 0403      Component Value Date/Time   CALCIUM 9.1 11/29/2015 0519   CALCIUM 8.6 (L) 08/27/2014 0403   ALKPHOS 56 11/28/2015 0406   AST 25 11/28/2015 0406   ALT 26 11/28/2015 0406   BILITOT 1.5 (H) 11/28/2015 0406       RADIOGRAPHIC STUDIES: I have personally reviewed the radiological images as listed and agreed with the findings in  the report. No results found.   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  Plan 20 prednisone once a day/ taper defer to Dr.Kernodle. Discussed with Dr.Kernodle- prednisone could be tapered off completely from hematology standpoint. However as per my discussion patient needs some prednisone for his rheumatologic problems. Also discussed with Dr.kernodle- regarding use of Imuran or CellCept is okay from hematology standpoint- as this could also help his secondary hemolytic anemia. Patient again has the appointment with rheumatology end of this month  # History of A. Fib- I think it's okay to start taking Xarelto as patient does not have GI bleed.  # Steroid myopathy-improving.   Labs in 3 week/ follow up with me in 6 weeks/labs.    Orders Placed This Encounter  Procedures  . CBC with Differential    Standing Status:   Future    Standing Expiration Date:   02/28/2017  . Lactate dehydrogenase    Standing Status:   Future    Standing Expiration Date:   02/28/2017  . CBC with Differential    Standing Status:   Future    Standing Expiration Date:   02/28/2017  . Lactate dehydrogenase    Standing Status:   Future    Standing Expiration Date:   02/28/2017  . Comprehensive metabolic panel    Standing Status:   Future    Standing Expiration Date:   02/28/2017  . Basic metabolic panel    Standing Status:   Future    Standing Expiration Date:   02/28/2017   All questions were answered. The patient knows to call the clinic with any problems, questions or concerns.      Cammie Sickle, MD 02/29/2016 10:40 AM

## 2016-03-10 ENCOUNTER — Other Ambulatory Visit: Payer: Self-pay | Admitting: Family Medicine

## 2016-03-17 ENCOUNTER — Ambulatory Visit (INDEPENDENT_AMBULATORY_CARE_PROVIDER_SITE_OTHER): Payer: 59 | Admitting: Family Medicine

## 2016-03-17 ENCOUNTER — Encounter: Payer: Self-pay | Admitting: Family Medicine

## 2016-03-17 VITALS — BP 118/62 | HR 76 | Temp 98.5°F | Ht 70.0 in | Wt 226.0 lb

## 2016-03-17 DIAGNOSIS — J219 Acute bronchiolitis, unspecified: Secondary | ICD-10-CM | POA: Diagnosis not present

## 2016-03-17 DIAGNOSIS — B9789 Other viral agents as the cause of diseases classified elsewhere: Secondary | ICD-10-CM | POA: Diagnosis not present

## 2016-03-17 DIAGNOSIS — J4521 Mild intermittent asthma with (acute) exacerbation: Secondary | ICD-10-CM

## 2016-03-17 DIAGNOSIS — J8410 Pulmonary fibrosis, unspecified: Secondary | ICD-10-CM

## 2016-03-17 DIAGNOSIS — Z23 Encounter for immunization: Secondary | ICD-10-CM

## 2016-03-17 DIAGNOSIS — J069 Acute upper respiratory infection, unspecified: Secondary | ICD-10-CM

## 2016-03-17 MED ORDER — ALBUTEROL SULFATE HFA 108 (90 BASE) MCG/ACT IN AERS: 2.0000 | INHALATION_SPRAY | Freq: Four times a day (QID) | RESPIRATORY_TRACT | 11 refills | Status: DC | PRN

## 2016-03-17 MED ORDER — FLUTICASONE-SALMETEROL 250-50 MCG/DOSE IN AEPB: INHALATION_SPRAY | RESPIRATORY_TRACT | 11 refills | Status: DC

## 2016-03-17 MED ORDER — AZITHROMYCIN 250 MG PO TABS: ORAL_TABLET | ORAL | 0 refills | Status: DC

## 2016-03-17 MED ORDER — AMOXICILLIN 500 MG PO CAPS: 500.0000 mg | ORAL_CAPSULE | Freq: Three times a day (TID) | ORAL | 0 refills | Status: DC

## 2016-03-17 NOTE — Progress Notes (Signed)
Name: [name]   MRN: [id number]    DOB: May 16, 1961   Date:03/17/2016       Progress Note  Subjective  Chief Complaint  Chief Complaint  Patient presents with  . COPD    Sinus Problem  This is a new problem. The current episode started 1 to 4 weeks ago. The problem has been waxing and waning since onset. There has been no fever. The fever has been present for 1 to 2 days. The pain is mild. Pertinent negatives include no chills, congestion, coughing, diaphoresis, ear pain, headaches, hoarse voice, neck pain, shortness of breath, sinus pressure, sneezing, sore throat or swollen glands. Past treatments include nothing. The treatment provided mild relief.  Asthma  He complains of chest tightness and wheezing. There is no cough, difficulty breathing, frequent throat clearing, hemoptysis, hoarse voice, shortness of breath or sputum production. This is a recurrent problem. The current episode started more than 1 year ago. The problem occurs intermittently. The problem has been waxing and waning. Associated symptoms include nasal congestion, postnasal drip and rhinorrhea. Pertinent negatives include no chest pain, dyspnea on exertion, ear congestion, ear pain, fever, headaches, heartburn, malaise/fatigue, myalgias, orthopnea, PND, sneezing, sore throat, trouble swallowing or weight loss. His symptoms are aggravated by change in weather, pollen and URI. His symptoms are alleviated by beta-agonist and steroid inhaler. He reports moderate improvement on treatment. His past medical history is significant for asthma and bronchitis. There is no history of bronchiectasis, COPD, emphysema or pneumonia.    No problem-specific Assessment & Plan notes found for this encounter.   Past Medical History:  Diagnosis Date  . Atrial flutter ([place])    a. on Xarelto; b. s/p successful TEE/DCCV on 08/28/2014  . Gangrene of finger ([place])   . History of nuclear stress test    a. 09/02/2014: no sig ischemia, GI  uptake noted, no sig WMA, EF 48%, no EKG chanes concerning for ischemia, low risk scan    . HTN (hypertension)   . Normal coronary arteries    a. cardiac cath 12/03/2014: no sigificant CAD, right dominant system, LVEF >55%, no MR or AS  . Pulmonary fibrosis ([place])    a. not on home oxygen  . RA (rheumatoid arthritis) ([place])   . 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 echo 07/2014: EF 20-25%, cannot exclude atrial flutter,  global HK, possible bicuspid Ao valve, mod reduced RV sys fxn, mild-mod aortic valve sclerosis/calcification, mod TR, mildly elevated RVSP; b. TEE 08/28/2014: EF 30-35%, no intracardic thrombus, mildly dilated LA/RA, mild TR, mild-mod aortic sclerosis w/o stenosis; c. echo 09/2014: EF 55-60%, select images w/ inf HK    Past Surgical History:  Procedure Laterality Date  . CARDIAC CATHETERIZATION N/A 12/03/2014   Procedure: Left Heart Cath and Coronary Angiography;  Surgeon: Minna Merritts, MD;  Location: [facility];  Service: Cardiovascular;  Laterality: N/A;  . COLONOSCOPY WITH PROPOFOL N/A 11/23/2015   Procedure: COLONOSCOPY WITH PROPOFOL;  Surgeon: [name], MD;  Location: [facility];  Service: Endoscopy;  Laterality: N/A;  . ESOPHAGOGASTRODUODENOSCOPY (EGD) WITH PROPOFOL N/A 11/23/2015   Procedure: ESOPHAGOGASTRODUODENOSCOPY (EGD) WITH PROPOFOL;  Surgeon: [name], MD;  Location: [facility];  Service: Endoscopy;  Laterality: N/A;  . PERIPHERAL VASCULAR CATHETERIZATION N/A 11/17/2015   Procedure: Upper Extremity Angiography;  Surgeon: Wellington Hampshire, MD;  Location: [facility];  Service: Cardiovascular;  Laterality: N/A;    Family History  Problem Relation Age of Onset  . Leukemia Mother  Social History   Social History  . Marital status: Married    Spouse name: N/A  . Number of children: N/A  . Years of education: N/A   Occupational History  . Not on file.   Social History Main Topics  . Smoking status: Former Smoker     Packs/day: 1.00    Years: 20.00    Types: Cigarettes    Quit date: 08/30/2004  . Smokeless tobacco: Never Used  . Alcohol use No  . Drug use: No  . Sexual activity: Not on file   Other Topics Concern  . Not on file   Social History Narrative  . No narrative on file    No Known Allergies   Review of Systems  Constitutional: Negative for chills, diaphoresis, fever, malaise/fatigue and weight loss.  HENT: Positive for postnasal drip and rhinorrhea. Negative for congestion, ear discharge, ear pain, hoarse voice, sinus pressure, sneezing, sore throat and trouble swallowing.   Eyes: Negative for blurred vision.  Respiratory: Positive for wheezing. Negative for cough, hemoptysis, sputum production and shortness of breath.   Cardiovascular: Negative for chest pain, dyspnea on exertion, palpitations, leg swelling and PND.  Gastrointestinal: Negative for abdominal pain, blood in stool, constipation, diarrhea, heartburn, melena and nausea.  Genitourinary: Negative for dysuria, frequency, hematuria and urgency.  Musculoskeletal: Negative for back pain, joint pain, myalgias and neck pain.  Skin: Negative for rash.  Neurological: Negative for dizziness, tingling, sensory change, focal weakness and headaches.  Endo/Heme/Allergies: Negative for environmental allergies and polydipsia. Does not bruise/bleed easily.  Psychiatric/Behavioral: Negative for depression and suicidal ideas. The patient is not nervous/anxious and does not have insomnia.      Objective  Vitals:   03/17/16 0925  BP: 118/62  Pulse: 76  Temp: 98.5 F (36.9 C)  Weight: 226 lb (102.5 kg)  Height: 5\' 10"  (1.778 m)    Physical Exam  Constitutional: He is oriented to person, place, and time and well-developed, well-nourished, and in no distress.  HENT:  Head: Normocephalic.  Right Ear: External ear normal.  Left Ear: External ear normal.  Nose: Nose normal.  Mouth/Throat: Oropharynx is clear and moist.  Eyes:  Conjunctivae and EOM are normal. Pupils are equal, round, and reactive to light. Right eye exhibits no discharge. Left eye exhibits no discharge. No scleral icterus.  Neck: Normal range of motion. Neck supple. No JVD present. No tracheal deviation present. No thyromegaly present.  Cardiovascular: Normal rate, regular rhythm, normal heart sounds and intact distal pulses.  Exam reveals no gallop and no friction rub.   No murmur heard. Pulmonary/Chest: No respiratory distress. He has no wheezes. He has rales.  Fine.bibasilar  Abdominal: Soft. Bowel sounds are normal. He exhibits no mass. There is no hepatosplenomegaly. There is no tenderness. There is no rebound, no guarding and no CVA tenderness.  Musculoskeletal: Normal range of motion. He exhibits no edema or tenderness.  Lymphadenopathy:    He has no cervical adenopathy.  Neurological: He is alert and oriented to person, place, and time. He has normal sensation, normal strength, normal reflexes and intact cranial nerves. No cranial nerve deficit.  Skin: Skin is warm. No rash noted.  Psychiatric: Mood and affect normal.      Assessment & Plan  Problem List Items Addressed This Visit      Respiratory   Pulmonary fibrosis ([place]) - Primary   Relevant Medications   Fluticasone-Salmeterol (ADVAIR DISKUS) 250-50 MCG/DOSE AEPB   albuterol (PROVENTIL HFA;VENTOLIN HFA) 108 (90 Base) MCG/ACT inhaler  amoxicillin (AMOXIL) 500 MG capsule    Other Visit Diagnoses    Viral upper respiratory tract infection       Relevant Medications   azithromycin (ZITHROMAX) 250 MG tablet   Bronchiolitis       call cva s and d/c azith   Relevant Medications   azithromycin (ZITHROMAX) 250 MG tablet   amoxicillin (AMOXIL) 500 MG capsule   Mild intermittent reactive airway disease with acute exacerbation       Relevant Medications   Fluticasone-Salmeterol (ADVAIR DISKUS) 250-50 MCG/DOSE AEPB   albuterol (PROVENTIL HFA;VENTOLIN HFA) 108 (90 Base) MCG/ACT  inhaler   Need for Tdap vaccination       Relevant Orders   Tdap vaccine greater than or equal to 7yo IM (Completed)        Dr. [facility]  03/17/16

## 2016-03-21 ENCOUNTER — Inpatient Hospital Stay: Payer: 59

## 2016-03-21 DIAGNOSIS — D594 Other nonautoimmune hemolytic anemias: Secondary | ICD-10-CM

## 2016-03-21 DIAGNOSIS — I4892 Unspecified atrial flutter: Secondary | ICD-10-CM | POA: Insufficient documentation

## 2016-03-21 DIAGNOSIS — I4821 Permanent atrial fibrillation: Secondary | ICD-10-CM | POA: Insufficient documentation

## 2016-03-21 DIAGNOSIS — D591 Other autoimmune hemolytic anemias: Secondary | ICD-10-CM | POA: Diagnosis not present

## 2016-03-21 LAB — CBC WITH DIFFERENTIAL/PLATELET
BASOS PCT: 1 %
Basophils Absolute: 0.1 10*3/uL (ref 0–0.1)
EOS ABS: 0.3 10*3/uL (ref 0–0.7)
Eosinophils Relative: 2 %
HCT: 44.3 % (ref 40.0–52.0)
HEMOGLOBIN: 14.5 g/dL (ref 13.0–18.0)
Lymphocytes Relative: 18 %
Lymphs Abs: 2.3 10*3/uL (ref 1.0–3.6)
MCH: 28.4 pg (ref 26.0–34.0)
MCHC: 32.8 g/dL (ref 32.0–36.0)
MCV: 86.5 fL (ref 80.0–100.0)
MONOS PCT: 10 %
Monocytes Absolute: 1.2 10*3/uL — ABNORMAL HIGH (ref 0.2–1.0)
NEUTROS PCT: 69 %
Neutro Abs: 8.8 10*3/uL — ABNORMAL HIGH (ref 1.4–6.5)
PLATELETS: 181 10*3/uL (ref 150–440)
RBC: 5.13 MIL/uL (ref 4.40–5.90)
RDW: 14.8 % — ABNORMAL HIGH (ref 11.5–14.5)
WBC: 12.6 10*3/uL — AB (ref 3.8–10.6)

## 2016-03-21 LAB — BASIC METABOLIC PANEL
Anion gap: 9 (ref 5–15)
BUN: 29 mg/dL — AB (ref 6–20)
CALCIUM: 9.3 mg/dL (ref 8.9–10.3)
CO2: 26 mmol/L (ref 22–32)
Chloride: 103 mmol/L (ref 101–111)
Creatinine, Ser: 1.02 mg/dL (ref 0.61–1.24)
GFR calc Af Amer: >60 mL/min (ref >60)
Glucose, Bld: 91 mg/dL (ref 65–99)
POTASSIUM: 3.1 mmol/L — AB (ref 3.5–5.1)
SODIUM: 138 mmol/L (ref 135–145)

## 2016-03-21 LAB — LACTATE DEHYDROGENASE: LDH: 216 U/L — AB (ref 98–192)

## 2016-03-26 ENCOUNTER — Other Ambulatory Visit: Payer: Self-pay | Admitting: Family Medicine

## 2016-03-26 DIAGNOSIS — J8410 Pulmonary fibrosis, unspecified: Secondary | ICD-10-CM

## 2016-03-26 DIAGNOSIS — J219 Acute bronchiolitis, unspecified: Secondary | ICD-10-CM

## 2016-04-03 ENCOUNTER — Ambulatory Visit (INDEPENDENT_AMBULATORY_CARE_PROVIDER_SITE_OTHER): Payer: 59 | Admitting: Vascular Surgery

## 2016-04-03 ENCOUNTER — Encounter (INDEPENDENT_AMBULATORY_CARE_PROVIDER_SITE_OTHER): Payer: Self-pay | Admitting: Vascular Surgery

## 2016-04-03 VITALS — BP 111/73 | HR 61 | Resp 16 | Ht 70.0 in | Wt 230.0 lb

## 2016-04-03 DIAGNOSIS — I776 Arteritis, unspecified: Secondary | ICD-10-CM

## 2016-04-03 DIAGNOSIS — I4892 Unspecified atrial flutter: Secondary | ICD-10-CM | POA: Diagnosis not present

## 2016-04-03 DIAGNOSIS — I96 Gangrene, not elsewhere classified: Secondary | ICD-10-CM | POA: Diagnosis not present

## 2016-04-03 DIAGNOSIS — I10 Essential (primary) hypertension: Secondary | ICD-10-CM | POA: Diagnosis not present

## 2016-04-03 DIAGNOSIS — J8410 Pulmonary fibrosis, unspecified: Secondary | ICD-10-CM

## 2016-04-03 HISTORY — DX: Arteritis, unspecified: I77.6

## 2016-04-03 NOTE — Progress Notes (Signed)
[facility] Admission History & Physical  MRN : [id number]  [name] is a 55 y.o. (07-13-1960) male who presents with chief complaint of  Chief Complaint  Patient presents with  . New Evaluation    Non healing fingers  .  History of Present Illness: The patient is seen for evaluation of painful upper extremity associated with multiple ulcers bilaterally. The patient notes that the pain is variable and he has good days and bad days.  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  He tries to protect himself but finds it to be impossible.  The pain and ulcers have been progressive over the past couple years which has prompted the concern for evaluation. The patient states arm pain and ulcers are having a significant negative impact on quality of life and daily activities.  He has a long standing history of rheumatoid arthritis and SLE  The patient denies a history of degenerative spine disease.  The patient denies rest pain. No significant weakness or atrophy is reported. There are no open wounds or sores at this time.  No prior vascular interventions or vascular surgeries.  The patient denies amaurosis fugax or recent TIA symptoms. There are no recent neurological changes noted. The patient denies history of DVT, PE or superficial thrombophlebitis. The patient denies recent episodes of angina or shortness of breath.   Current Meds  Medication Sig  . albuterol (PROVENTIL HFA;VENTOLIN HFA) 108 (90 Base) MCG/ACT inhaler Inhale 2 puffs into the lungs 4 (four) times daily as needed for wheezing or shortness of breath.  [name] amiodarone (PACERONE) 200 MG tablet Take 1 tablet (200 mg total) by mouth 2 (two) times daily. (Patient taking differently: Take 200 mg by mouth 2 (two) times daily. Dr Delmer Islam)  . amLODipine (NORVASC) 5 MG tablet TAKE 1 TABLET (5 MG  TOTAL) BY MOUTH ONCE DAILY.  [name] amoxicillin (AMOXIL) 500 MG capsule TAKE 1 CAPSULE (500 MG TOTAL) BY MOUTH 3 (THREE) TIMES DAILY.  [name] diltiazem (CARDIZEM) 30 MG tablet Take 1 tablet (30 mg total) by mouth 3 (three) times daily. (Patient taking differently: Take 30 mg by mouth 3 (three) times daily. Dr Delmer Islam)  . enalapril (VASOTEC) 20 MG tablet Take 20 mg by mouth daily. Dr Delmer Islam  . fluticasone (FLONASE) 50 MCG/ACT nasal spray Place 2 sprays into both nostrils daily.  . Fluticasone-Salmeterol (ADVAIR DISKUS) 250-50 MCG/DOSE AEPB INHALE 1 DOSE BY MOUTH TWICE DAILY. RINSE MOUTH AFTER USE  . hydrochlorothiazide (HYDRODIURIL) 25 MG tablet TAKE 1 TABLET BY MOUTH EVERY DAY. NEED APPT FOR REFILLS  . hydroxychloroquine (PLAQUENIL) 200 MG tablet Take 200 mg by mouth daily. Dr Delmer Islam  . meloxicam (MOBIC) 15 MG tablet Take 1 tablet (15 mg total) by mouth daily.  . metoprolol (LOPRESSOR) 50 MG tablet Take 75 mg by mouth 2 (two) times daily. Dr Delmer Islam  . mycophenolate (CELLCEPT) 500 MG tablet Take 500 mg by mouth 2 (two) times daily.  [name] NITRO-BID 2 % ointment Dr Precious Reel  . predniSONE (DELTASONE) 20 MG tablet TAKE 1 TABLET (20 MG TOTAL) BY MOUTH DAILY WITH BREAKFAST. (Patient taking differently: Take 15 mg by mouth daily with breakfast. Dr Precious Reel)  . rivaroxaban (XARELTO) 20 MG TABS tablet Take 1 tablet (20 mg total) by mouth daily with supper. (Patient taking differently: Take 20 mg by mouth daily with supper. Dr Delmer Islam)  Past Medical History:  Diagnosis Date  . Atrial flutter ([place])    a. on Xarelto; b. s/p successful TEE/DCCV on 08/28/2014  . Gangrene of finger ([place])   . History of nuclear stress test    a. 09/02/2014: no sig ischemia, GI uptake noted, no sig WMA, EF 48%, no EKG chanes concerning for ischemia, low risk scan    . HTN (hypertension)   . Normal coronary arteries    a. cardiac cath 12/03/2014: no sigificant CAD, right dominant system, LVEF >55%, no MR or AS  . Pulmonary  fibrosis ([place])    a. not on home oxygen  . RA (rheumatoid arthritis) ([place])   . 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 echo 07/2014: EF 20-25%, cannot exclude atrial flutter,  global HK, possible bicuspid Ao valve, mod reduced RV sys fxn, mild-mod aortic valve sclerosis/calcification, mod TR, mildly elevated RVSP; b. TEE 08/28/2014: EF 30-35%, no intracardic thrombus, mildly dilated LA/RA, mild TR, mild-mod aortic sclerosis w/o stenosis; c. echo 09/2014: EF 55-60%, select images w/ inf HK    Past Surgical History:  Procedure Laterality Date  . CARDIAC CATHETERIZATION N/A 12/03/2014   Procedure: Left Heart Cath and Coronary Angiography;  Surgeon: Minna Merritts, MD;  Location: [facility];  Service: Cardiovascular;  Laterality: N/A;  . COLONOSCOPY WITH PROPOFOL N/A 11/23/2015   Procedure: COLONOSCOPY WITH PROPOFOL;  Surgeon: [name], MD;  Location: [facility];  Service: Endoscopy;  Laterality: N/A;  . ESOPHAGOGASTRODUODENOSCOPY (EGD) WITH PROPOFOL N/A 11/23/2015   Procedure: ESOPHAGOGASTRODUODENOSCOPY (EGD) WITH PROPOFOL;  Surgeon: [name], MD;  Location: [facility];  Service: Endoscopy;  Laterality: N/A;  . PERIPHERAL VASCULAR CATHETERIZATION N/A 11/17/2015   Procedure: Upper Extremity Angiography;  Surgeon: Wellington Hampshire, MD;  Location: [facility];  Service: Cardiovascular;  Laterality: N/A;    Social History Social History  Substance Use Topics  . Smoking status: Former Smoker    Packs/day: 1.00    Years: 20.00    Types: Cigarettes    Quit date: 08/30/2004  . Smokeless tobacco: Never Used  . Alcohol use No    Family History Family History  Problem Relation Age of Onset  . Leukemia Mother   No family history of bleeding/clotting disorders, porphyria or autoimmune disease   No Known Allergies   REVIEW OF SYSTEMS (Negative unless checked)  Constitutional: [] Weight loss  [] Fever  [] Chills Cardiac: [] Chest pain   [] Chest pressure    [] Palpitations   [] Shortness of breath when laying flat   [] Shortness of breath with exertion. Vascular:  [] Pain in legs with walking   [] Pain in legs at rest  [] History of DVT   [] Phlebitis   [] Swelling in legs   [] Varicose veins   [] Non-healing ulcers Pulmonary:   [] Uses home oxygen   [] Productive cough   [] Hemoptysis   [] Wheeze  [] COPD   [] Asthma Neurologic:  [] Dizziness   [] Seizures   [] History of stroke   [] History of TIA  [] Aphasia   [] Vissual changes   [] Weakness or numbness in arm   [] Weakness or numbness in leg Musculoskeletal:   [x] Joint swelling   [x] Joint pain   [x] Low back pain Hematologic:  [] Easy bruising  [] Easy bleeding   [] Hypercoagulable state   [x] Anemic Gastrointestinal:  [] Diarrhea   [] Vomiting  [] Gastroesophageal reflux/heartburn   [] Difficulty swallowing. Genitourinary:  [] Chronic kidney disease   [] Difficult urination  [] Frequent urination   [] Blood in urine Skin:  [] Rashes   [] Ulcers  Psychological:  [] History of anxiety   []   History of major depression.  Physical Examination  Vitals:   04/03/16 0827  BP: 111/73  Pulse: 61  Resp: 16  Weight: 230 lb (104.3 kg)  Height: 5\' 10"  (1.778 m)   Body mass index is 33 kg/m. Gen: WD/WN, NAD Head: [place]/AT, No temporalis wasting.  Ear/Nose/Throat: Hearing grossly intact, nares w/o erythema or drainage, poor dentition Eyes: PER, EOMI, sclera nonicteric.  Neck: Supple, no masses.  No bruit or JVD.  Pulmonary:  Good air movement, clear to auscultation bilaterally, no use of accessory muscles.  Cardiac: RRR, normal S1, S2, no Murmurs. Vascular:  Multiple ulcers in the left 5th finger and the right 2nd finger noninfected minimal granulation no drainage or odor.  No pulsatile subclavian masses no bruits of the shoulder or neck Vessel Right Left  Radial Palpable Palpable  Ulnar Not Palpable Not Palpable  Brachial Palpable Palpable  Gastrointestinal: soft, non-distended. No guarding/no peritoneal signs.  Musculoskeletal: M/S  5/5 throughout.  No deformity or atrophy.  Neurologic: CN 2-12 intact. Pain and light touch intact in extremities.  Symmetrical.  Speech is fluent. Motor exam as listed above. Psychiatric: Judgment intact, Mood & affect appropriate for pt's clinical situation. Dermatologic: No rashes or ulcers noted.  No changes consistent with cellulitis. Lymph : No Cervical lymphadenopathy, no lichenification or skin changes of chronic lymphedema.  CBC Lab Results  Component Value Date   WBC 12.6 (H) 03/21/2016   HGB 14.5 03/21/2016   HCT 44.3 03/21/2016   MCV 86.5 03/21/2016   PLT 181 03/21/2016    BMET    Component Value Date/Time   NA 138 03/21/2016 0854   NA 138 12/11/2014 1518   NA 137 08/27/2014 0403   K 3.1 (L) 03/21/2016 0854   K 4.1 08/27/2014 0403   CL 103 03/21/2016 0854   CL 108 08/27/2014 0403   CO2 26 03/21/2016 0854   CO2 26 08/27/2014 0403   GLUCOSE 91 03/21/2016 0854   GLUCOSE 94 08/27/2014 0403   BUN 29 (H) 03/21/2016 0854   BUN 24 12/11/2014 1518   BUN 16 08/27/2014 0403   CREATININE 1.02 03/21/2016 0854   CREATININE 0.85 08/27/2014 0403   CALCIUM 9.3 03/21/2016 0854   CALCIUM 8.6 (L) 08/27/2014 0403   GFRNONAA >60 03/21/2016 0854   GFRNONAA >60 08/27/2014 0403   GFRAA >60 03/21/2016 0854   GFRAA >60 08/27/2014 0403   Estimated Creatinine Clearance: 97.8 mL/min (by C-G formula based on SCr of 1.02 mg/dL).  COAG Lab Results  Component Value Date   INR 1.03 11/17/2015   INR 2.06 11/08/2015   INR 1.63 12/01/2014    Radiology No results found.  Assessment/Plan 1. Gangrene of finger ([place]) I will obtain arterial duplex to exclude aneurysms or ASO.  However, given the long standing nature of his ulcer and the palpable radial pulse I am suspicious that this is related to his autoimmune disease and cold exposure possibly cryoglobulins or severe Raynaud's.  I would like to have vascular medicine see him and will arrange a consult.  I would defer wound care to the  hand surgeon he has already seen.   A total of 70 minutes was spent with this patient and greater than 50% was spent in counseling and coordination of care with the patient.  Discussion included the treatment options for vascular disease including indications for surgery and intervention.  Also discussed is the appropriate timing of treatment.  In addition medical therapy was discussed.   2. Vasculitis ([place]) See #1 plan  per Dr Lindon Romp  3. Essential hypertension Continue antihypertensive medications as already ordered and reviewed, no changes at this time.  4. Atrial flutter, unspecified type ([place]) Continue antiarrhythmic medications as already ordered and reviewed, no changes at this time.  5. Pulmonary fibrosis ([place]) Continue aerosol medications as already ordered and reviewed, no changes at this time.     Hortencia Pilar, MD  04/03/2016 12:32 PM

## 2016-04-05 ENCOUNTER — Encounter (INDEPENDENT_AMBULATORY_CARE_PROVIDER_SITE_OTHER): Payer: 59

## 2016-04-05 ENCOUNTER — Ambulatory Visit (INDEPENDENT_AMBULATORY_CARE_PROVIDER_SITE_OTHER): Payer: 59 | Admitting: Vascular Surgery

## 2016-04-11 ENCOUNTER — Inpatient Hospital Stay: Payer: 59 | Attending: Internal Medicine

## 2016-04-11 ENCOUNTER — Inpatient Hospital Stay (HOSPITAL_BASED_OUTPATIENT_CLINIC_OR_DEPARTMENT_OTHER): Payer: 59 | Admitting: Internal Medicine

## 2016-04-11 VITALS — BP 112/75 | HR 57 | Temp 98.0°F | Resp 18 | Wt 226.4 lb

## 2016-04-11 DIAGNOSIS — I429 Cardiomyopathy, unspecified: Secondary | ICD-10-CM | POA: Insufficient documentation

## 2016-04-11 DIAGNOSIS — M069 Rheumatoid arthritis, unspecified: Secondary | ICD-10-CM | POA: Diagnosis not present

## 2016-04-11 DIAGNOSIS — Z87891 Personal history of nicotine dependence: Secondary | ICD-10-CM | POA: Insufficient documentation

## 2016-04-11 DIAGNOSIS — I4891 Unspecified atrial fibrillation: Secondary | ICD-10-CM | POA: Insufficient documentation

## 2016-04-11 DIAGNOSIS — Z79899 Other long term (current) drug therapy: Secondary | ICD-10-CM | POA: Diagnosis not present

## 2016-04-11 DIAGNOSIS — Z7901 Long term (current) use of anticoagulants: Secondary | ICD-10-CM | POA: Diagnosis not present

## 2016-04-11 DIAGNOSIS — D594 Other nonautoimmune hemolytic anemias: Secondary | ICD-10-CM

## 2016-04-11 DIAGNOSIS — D591 Other autoimmune hemolytic anemias: Secondary | ICD-10-CM

## 2016-04-11 DIAGNOSIS — I10 Essential (primary) hypertension: Secondary | ICD-10-CM | POA: Diagnosis not present

## 2016-04-11 LAB — COMPREHENSIVE METABOLIC PANEL
ALK PHOS: 40 U/L (ref 38–126)
ALT: 30 U/L (ref 17–63)
ANION GAP: 9 (ref 5–15)
AST: 27 U/L (ref 15–41)
Albumin: 3.9 g/dL (ref 3.5–5.0)
BUN: 25 mg/dL — ABNORMAL HIGH (ref 6–20)
CALCIUM: 8.9 mg/dL (ref 8.9–10.3)
CHLORIDE: 103 mmol/L (ref 101–111)
CO2: 27 mmol/L (ref 22–32)
CREATININE: 1.1 mg/dL (ref 0.61–1.24)
Glucose, Bld: 101 mg/dL — ABNORMAL HIGH (ref 65–99)
Potassium: 3.4 mmol/L — ABNORMAL LOW (ref 3.5–5.1)
Sodium: 139 mmol/L (ref 135–145)
Total Bilirubin: 0.5 mg/dL (ref 0.3–1.2)
Total Protein: 6.8 g/dL (ref 6.5–8.1)

## 2016-04-11 LAB — CBC WITH DIFFERENTIAL/PLATELET
BASOS PCT: 1 %
Basophils Absolute: 0.1 10*3/uL (ref 0–0.1)
EOS ABS: 0.3 10*3/uL (ref 0–0.7)
EOS PCT: 2 %
HCT: 43.2 % (ref 40.0–52.0)
HEMOGLOBIN: 13.9 g/dL (ref 13.0–18.0)
LYMPHS ABS: 2.2 10*3/uL (ref 1.0–3.6)
Lymphocytes Relative: 18 %
MCH: 27.6 pg (ref 26.0–34.0)
MCHC: 32.2 g/dL (ref 32.0–36.0)
MCV: 85.7 fL (ref 80.0–100.0)
MONO ABS: 1 10*3/uL (ref 0.2–1.0)
MONOS PCT: 8 %
NEUTROS PCT: 71 %
Neutro Abs: 8.7 10*3/uL — ABNORMAL HIGH (ref 1.4–6.5)
Platelets: 182 10*3/uL (ref 150–440)
RBC: 5.04 MIL/uL (ref 4.40–5.90)
RDW: 15.4 % — AB (ref 11.5–14.5)
WBC: 12.3 10*3/uL — ABNORMAL HIGH (ref 3.8–10.6)

## 2016-04-11 LAB — LACTATE DEHYDROGENASE: LDH: 205 U/L — AB (ref 98–192)

## 2016-04-11 NOTE — Progress Notes (Signed)
[facility] OFF[facility] PROGRESS NOTE  Patient Care Team: Juline Patch, MD as PCP - General (Family Medicine) Minna Merritts, MD as Consulting Physician (Cardiology)  No matching staging information was found for the patient.    No history exists.   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  male pleasant patient above history of Autoimmune/connective tissue disorder; And recently diagnosed acute hemolytic anemia is here for follow-up. Patient is currently on prednisone 15mg  [as per rheumatology]; Also started on CellCept through rheumatology. He is tolerating it well.  Patient feels back to his baseline. Weight gain. Denies any muscle weakness. No difficulty swallowing pain with swallowing.  REVIEW OF SYSTEMS:  A complete 10 point review of system is done which is negative except mentioned above/history of present illness.   PAST MEDICAL HISTORY :  Past Medical History:  Diagnosis Date  . Atrial flutter ([place])    a. on Xarelto; b. s/p successful TEE/DCCV on 08/28/2014  . Gangrene of finger ([place])   . History of nuclear stress test    a. 09/02/2014: no sig ischemia, GI uptake noted, no sig WMA, EF 48%, no EKG chanes concerning for ischemia, low risk scan    . HTN (hypertension)   . Normal coronary arteries    a. cardiac cath 12/03/2014: no sigificant CAD, right dominant system, LVEF >55%, no MR or AS  . Pulmonary fibrosis ([place])    a. not on home oxygen  . RA (rheumatoid arthritis) ([place])   . 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 echo 07/2014: EF 20-25%, cannot exclude atrial flutter,  global HK, possible bicuspid Ao valve, mod reduced RV sys fxn, mild-mod aortic valve sclerosis/calcification, mod  TR, mildly elevated RVSP; b. TEE 08/28/2014: EF 30-35%, no intracardic thrombus, mildly dilated LA/RA, mild TR, mild-mod aortic sclerosis w/o stenosis; c. echo 09/2014: EF 55-60%, select images w/ inf HK    PAST SURGICAL HISTORY :   Past Surgical History:  Procedure Laterality Date  . CARDIAC CATHETERIZATION N/A 12/03/2014   Procedure: Left Heart Cath and Coronary Angiography;  Surgeon: Minna Merritts, MD;  Location: [facility];  Service: Cardiovascular;  Laterality: N/A;  . COLONOSCOPY WITH PROPOFOL N/A 11/23/2015   Procedure: COLONOSCOPY WITH PROPOFOL;  Surgeon: [name], MD;  Location: [facility];  Service: Endoscopy;  Laterality: N/A;  . ESOPHAGOGASTRODUODENOSCOPY (EGD) WITH PROPOFOL N/A 11/23/2015   Procedure: ESOPHAGOGASTRODUODENOSCOPY (EGD) WITH PROPOFOL;  Surgeon: [name], MD;  Location: [facility];  Service: Endoscopy;  Laterality: N/A;  . PERIPHERAL VASCULAR CATHETERIZATION N/A 11/17/2015   Procedure: Upper Extremity Angiography;  Surgeon: Wellington Hampshire, MD;  Location: [facility];  Service: Cardiovascular;  Laterality: N/A;    FAMILY HISTORY :   Family History  Problem Relation Age of Onset  . Leukemia Mother     SOCIAL HISTORY:   Social History  Substance Use Topics  . Smoking status: Former Smoker    Packs/day: 1.00    Years: 20.00    Types: Cigarettes    Quit date: 08/30/2004  . Smokeless tobacco: Never Used  . Alcohol use No    ALLERGIES:  has No Known Allergies.  MEDICATIONS:  Current Outpatient Prescriptions  Medication Sig Dispense Refill  . albuterol (PROVENTIL HFA;VENTOLIN HFA) 108 (90 Base) MCG/ACT inhaler  Inhale 2 puffs into the lungs 4 (four) times daily as needed for wheezing or shortness of breath. 1 Inhaler 11  . amiodarone (PACERONE) 200 MG tablet Take 1 tablet (200 mg total) by mouth 2 (two) times daily. (Patient taking differently: Take 200 mg by mouth 2 (two) times daily. Dr Delmer Islam) 60 tablet 5  . amLODipine (NORVASC)  5 MG tablet TAKE 1 TABLET (5 MG TOTAL) BY MOUTH ONCE DAILY.  11  . diltiazem (CARDIZEM) 30 MG tablet Take 1 tablet (30 mg total) by mouth 3 (three) times daily. (Patient taking differently: Take 30 mg by mouth 3 (three) times daily. Dr Delmer Islam) 90 tablet 3  . enalapril (VASOTEC) 20 MG tablet Take 20 mg by mouth daily. Dr Delmer Islam    . fluticasone (FLONASE) 50 MCG/ACT nasal spray Place 2 sprays into both nostrils daily. 16 g 0  . Fluticasone-Salmeterol (ADVAIR DISKUS) 250-50 MCG/DOSE AEPB INHALE 1 DOSE BY MOUTH TWICE DAILY. RINSE MOUTH AFTER USE 60 each 11  . hydrochlorothiazide (HYDRODIURIL) 25 MG tablet TAKE 1 TABLET BY MOUTH EVERY DAY. NEED APPT FOR REFILLS 90 tablet 2  . hydroxychloroquine (PLAQUENIL) 200 MG tablet Take 200 mg by mouth daily. Dr Delmer Islam    . meloxicam (MOBIC) 15 MG tablet Take 1 tablet (15 mg total) by mouth daily. 30 tablet 0  . metoprolol (LOPRESSOR) 50 MG tablet Take 75 mg by mouth 2 (two) times daily. Dr Delmer Islam    . mycophenolate (CELLCEPT) 500 MG tablet Take 500 mg by mouth 2 (two) times daily.  11  . predniSONE (DELTASONE) 20 MG tablet TAKE 1 TABLET (20 MG TOTAL) BY MOUTH DAILY WITH BREAKFAST. (Patient taking differently: Take 15 mg by mouth daily with breakfast. Dr Precious Reel) 30 tablet 0  . rivaroxaban (XARELTO) 20 MG TABS tablet Take 1 tablet (20 mg total) by mouth daily with supper. (Patient taking differently: Take 20 mg by mouth daily with supper. Dr Delmer Islam) 90 tablet 3  . NITRO-BID 2 % ointment Dr Precious Reel     No current facility-administered medications for this visit.     PHYSICAL EXAMINATION:   BP 112/75 (BP Location: Left Arm, Patient Position: Sitting)   Pulse (!) 57   Temp 98 F (36.7 C) (Tympanic)   Resp 18   Wt 226 lb 6.6 oz (102.7 kg)   BMI 32.49 kg/m   Filed Weights   04/11/16 0927  Weight: 226 lb 6.6 oz (102.7 kg)    GENERAL: Well-nourished well-developed; Alert, no distress and comfortable.  Alone.  EYES: no pallor or  icterus OROPHARYNX: no thrush or ulceration; good dentition  NECK: supple, no masses felt LYMPH:  no palpable lymphadenopathy in the cervical, axillary or inguinal regions LUNGS: clear to auscultation and  No wheeze or crackles HEART/CVS: regular rate & rhythm and no murmurs; No lower extremity edema ABDOMEN:abdomen soft, non-tender and normal bowel sounds Musculoskeletal:no cyanosis of digits and no clubbing  PSYCH: alert & oriented x 3 with fluent speech NEURO: no focal motor/sensory deficits SKIN:  no rashes or significant lesions  LABORATORY DATA:  I have reviewed the data as listed    Component Value Date/Time   NA 139 04/11/2016 0856   NA 138 12/11/2014 1518   NA 137 08/27/2014 0403   K 3.4 (L) 04/11/2016 0856   K 4.1 08/27/2014 0403   CL 103 04/11/2016 0856   CL 108 08/27/2014 0403   CO2 27 04/11/2016 0856   CO2 26 08/27/2014 0403   GLUCOSE 101 (H) 04/11/2016  0856   GLUCOSE 94 08/27/2014 0403   BUN 25 (H) 04/11/2016 0856   BUN 24 12/11/2014 1518   BUN 16 08/27/2014 0403   CREATININE 1.10 04/11/2016 0856   CREATININE 0.85 08/27/2014 0403   CALCIUM 8.9 04/11/2016 0856   CALCIUM 8.6 (L) 08/27/2014 0403   PROT 6.8 04/11/2016 0856   ALBUMIN 3.9 04/11/2016 0856   AST 27 04/11/2016 0856   ALT 30 04/11/2016 0856   ALKPHOS 40 04/11/2016 0856   BILITOT 0.5 04/11/2016 0856   GFRNONAA >60 04/11/2016 0856   GFRNONAA >60 08/27/2014 0403   GFRAA >60 04/11/2016 0856   GFRAA >60 08/27/2014 0403    No results found for: SPEP, UPEP  Lab Results  Component Value Date   WBC 12.3 (H) 04/11/2016   NEUTROABS 8.7 (H) 04/11/2016   HGB 13.9 04/11/2016   HCT 43.2 04/11/2016   MCV 85.7 04/11/2016   PLT 182 04/11/2016      Chemistry      Component Value Date/Time   NA 139 04/11/2016 0856   NA 138 12/11/2014 1518   NA 137 08/27/2014 0403   K 3.4 (L) 04/11/2016 0856   K 4.1 08/27/2014 0403   CL 103 04/11/2016 0856   CL 108 08/27/2014 0403   CO2 27 04/11/2016 0856   CO2 26  08/27/2014 0403   BUN 25 (H) 04/11/2016 0856   BUN 24 12/11/2014 1518   BUN 16 08/27/2014 0403   CREATININE 1.10 04/11/2016 0856   CREATININE 0.85 08/27/2014 0403      Component Value Date/Time   CALCIUM 8.9 04/11/2016 0856   CALCIUM 8.6 (L) 08/27/2014 0403   ALKPHOS 40 04/11/2016 0856   AST 27 04/11/2016 0856   ALT 30 04/11/2016 0856   BILITOT 0.5 04/11/2016 0856       RADIOGRAPHIC STUDIES: I have personally reviewed the radiological images as listed and agreed with the findings in the report. No results found.   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 Also on cellcept 500 BID [for connective tissue disorder]- also discussed that this should also help his hemolytic anemia.   # History of A. Fib- on Xarelto as patient does not have GI bleed.  # Steroid myopathy-improving.   #follow up in 3 months/labs.    Orders Placed This Encounter  Procedures  . CBC with Differential    Standing Status:   Future    Standing Expiration Date:   04/11/2017  . Comprehensive metabolic panel    Standing Status:   Future    Standing Expiration Date:   04/11/2017  . Lactate dehydrogenase    Standing Status:   Future    Standing Expiration Date:   04/11/2017   All questions were answered. The patient knows to call the clinic with any problems, questions or concerns.      Cammie Sickle, MD 04/11/2016 9:54 AM

## 2016-04-11 NOTE — Assessment & Plan Note (Addendum)
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 Also on cellcept 500 BID [for connective tissue disorder]- also discussed that this should also help his hemolytic anemia.   # History of A. Fib- on Xarelto as patient does not have GI bleed.  # Steroid myopathy-improving.   #follow up in 3 months/labs.

## 2016-04-19 DIAGNOSIS — L930 Discoid lupus erythematosus: Secondary | ICD-10-CM | POA: Insufficient documentation

## 2016-04-19 DIAGNOSIS — D869 Sarcoidosis, unspecified: Secondary | ICD-10-CM | POA: Insufficient documentation

## 2016-04-19 DIAGNOSIS — D6859 Other primary thrombophilia: Secondary | ICD-10-CM | POA: Insufficient documentation

## 2016-04-19 DIAGNOSIS — I998 Other disorder of circulatory system: Secondary | ICD-10-CM | POA: Insufficient documentation

## 2016-04-19 HISTORY — DX: Discoid lupus erythematosus: L93.0

## 2016-04-25 ENCOUNTER — Other Ambulatory Visit: Payer: Self-pay | Admitting: Specialist

## 2016-04-25 DIAGNOSIS — R0602 Shortness of breath: Secondary | ICD-10-CM

## 2016-04-25 DIAGNOSIS — J8410 Pulmonary fibrosis, unspecified: Secondary | ICD-10-CM

## 2016-04-25 DIAGNOSIS — M351 Other overlap syndromes: Secondary | ICD-10-CM

## 2016-05-05 ENCOUNTER — Telehealth: Payer: Self-pay | Admitting: Cardiovascular Disease

## 2016-05-05 NOTE — Telephone Encounter (Signed)
Pt was advised to call our office for cardiac clearance to proceed w/ V&V graft & amputation of his pinky finger on 05/11/16 w/ Dr. Truman Hayward @ [facility]. He would like instructions on holding Xarelto prior to procedure. Please route clearance and instructions to [phone].

## 2016-05-05 NOTE — Telephone Encounter (Signed)
Pt calling stating he is leaving pre op (at another hospital)   He is on xarelto and would like to know when to stop taking it He is having a Vascular, Vein drapht, and amputate his pinky finger Dr Truman Hayward from [facility] [phone]  Surgery is on 14 th   Please advise.

## 2016-05-07 NOTE — Telephone Encounter (Signed)
Can we seen if he can come in for preop appt 11:40 on Monday

## 2016-05-08 ENCOUNTER — Ambulatory Visit
Admission: RE | Admit: 2016-05-08 | Discharge: 2016-05-08 | Disposition: A | Discharge status: Home / Self Care | Payer: 59 | Source: Ambulatory Visit | Attending: Specialist | Admitting: Specialist

## 2016-05-08 ENCOUNTER — Ambulatory Visit (INDEPENDENT_AMBULATORY_CARE_PROVIDER_SITE_OTHER): Payer: 59 | Admitting: Cardiovascular Disease

## 2016-05-08 ENCOUNTER — Encounter: Payer: Self-pay | Admitting: Cardiovascular Disease

## 2016-05-08 ENCOUNTER — Ambulatory Visit: Payer: 59 | Admitting: Cardiovascular Disease

## 2016-05-08 VITALS — BP 118/76 | HR 62 | Ht 70.0 in | Wt 228.2 lb

## 2016-05-08 DIAGNOSIS — J8410 Pulmonary fibrosis, unspecified: Secondary | ICD-10-CM | POA: Diagnosis present

## 2016-05-08 DIAGNOSIS — I483 Typical atrial flutter: Secondary | ICD-10-CM | POA: Diagnosis not present

## 2016-05-08 DIAGNOSIS — M351 Other overlap syndromes: Secondary | ICD-10-CM

## 2016-05-08 DIAGNOSIS — E041 Nontoxic single thyroid nodule: Secondary | ICD-10-CM | POA: Insufficient documentation

## 2016-05-08 DIAGNOSIS — I200 Unstable angina: Secondary | ICD-10-CM | POA: Diagnosis not present

## 2016-05-08 DIAGNOSIS — I96 Gangrene, not elsewhere classified: Secondary | ICD-10-CM

## 2016-05-08 DIAGNOSIS — I7789 Other specified disorders of arteries and arterioles: Secondary | ICD-10-CM | POA: Diagnosis not present

## 2016-05-08 DIAGNOSIS — J479 Bronchiectasis, uncomplicated: Secondary | ICD-10-CM | POA: Diagnosis not present

## 2016-05-08 DIAGNOSIS — R000 Tachycardia, unspecified: Secondary | ICD-10-CM

## 2016-05-08 DIAGNOSIS — I43 Cardiomyopathy in diseases classified elsewhere: Secondary | ICD-10-CM

## 2016-05-08 DIAGNOSIS — R0602 Shortness of breath: Secondary | ICD-10-CM

## 2016-05-08 DIAGNOSIS — I776 Arteritis, unspecified: Secondary | ICD-10-CM

## 2016-05-08 DIAGNOSIS — M069 Rheumatoid arthritis, unspecified: Secondary | ICD-10-CM

## 2016-05-08 MED ORDER — IOPAMIDOL (ISOVUE-300) INJECTION 61%
75.0000 mL | Freq: Once | INTRAVENOUS | Status: AC | PRN
  Administered 2016-05-08: 75 mL via INTRAVENOUS

## 2016-05-08 NOTE — Progress Notes (Signed)
Cardiology Office Note  Date:  05/08/2016   ID:  [name], DOB 04-17-1961, MRN [id number]  PCP:  Otilio Miu, MD   Chief Complaint  Patient presents with  . other     F/u cardiac clearance to proceed w/ V&V graft & amputation of his pinky finger on 05/11/16 w/ Dr. Truman Hayward. Pt c/o sob. Reviewed meds with pt verbally.    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  EKG on today's visit shows normal sinus rhythm with rate 62 bpm, no significant ST-T wave changes  Other past medical history presented to the hospital 11/22/2015 with weakness, fall, hemoglobin of 5.5 Guiac negative, GI was consult and colonoscopy and EGD revealed no evidence of bleeding, Further evaluation by hematology confirmed hemolytic anemia, started on high-dose prednisone He did have transfusion 4  seen on a regular basis by Dr. Jefm Bryant for his mixed connective tissue disease Last visit January 2017 per the  notes  Previous 30 day monitor that showed 3 episodes of PVCs in a bigeminal manner. He was asymptomatic. Arrhythmia sometimes in the morning, other times in the early evening. Was not on a consistent basis.  admitted to [facility] in April of 2016 for 3 week history of increased dyspnea and palpitations and found to be in new onset atrial flutter.  TTE showed EF 20-25%, global HK, possible bicuspid aortic valve.  successful TEE/DCCV on 4/1. 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   Repeat TTE showed improved EF of 55-60%, select images suggestive of hypokinesis of the inferior and posterior myocardium. LV diastolic parameters were normal. Left atrium was normal in size. RV function was normal. PASP was normal. He was advised further work up if he has symptoms.   On 11/27/14 while at work he suddenly developed onset of palpitations that led to increased SOB, especially with ambulation causing patient to leave work early. Patient checked his pulse and found it to be in the 140's.Pulse went back into the 60's to 80's without intervention.  Recurrent chest pain 7/5 he was woken up around 2 AM with left shoulder pain that radiated to his left chest.  He presented to [facility] for evaluation. Troponin was found to be mildly elevated and flated trending 0.08-->0.05-->0.06-->0.07.  He underwent cardiac cath 48 hours which showed right dominant system, no significant CAD, normal LV gram with EF >55%  the patient reports hand sensitivity to cold which started about 2 years ago and has been worsening. His fingers turn blue  and her with significant pain when he is exposed to cold weather. He usually does better in the summertime. He developed an ulceration on the tip of the left small finger about 3 months ago which has gradually worsened with early gangrenous changes. No trauma.  he underwent upper  extremity duplex which showed atypical flow in the ulnar artery and normal flow through the radial artery.  PMH:   has a past medical history of Anemia (10/2015); Atrial flutter ([place]); Gangrene of finger ([place]); History of nuclear stress test; HTN (hypertension); Normal coronary arteries; Pulmonary fibrosis (HCC); RA (rheumatoid arthritis) ([place]); and Tachycardia-induced cardiomyopathy ([place]).  PSH:    Past Surgical History:  Procedure Laterality Date  . CARDIAC CATHETERIZATION N/A 12/03/2014   Procedure: Left Heart Cath and Coronary Angiography;  Surgeon: Minna Merritts, MD;  Location: [place] [facility];  Service: Cardiovascular;  Laterality: N/A;  . COLONOSCOPY WITH PROPOFOL N/A 11/23/2015   Procedure: COLONOSCOPY WITH PROPOFOL;  Surgeon: [name], MD;  Location: [facility] ENDOSCOPY;  Service: Endoscopy;  Laterality: N/A;  . ESOPHAGOGASTRODUODENOSCOPY (EGD) WITH PROPOFOL N/A 11/23/2015   Procedure: ESOPHAGOGASTRODUODENOSCOPY (EGD) WITH PROPOFOL;  Surgeon: [name], MD;  Location: [facility] ENDOSCOPY;  Service: Endoscopy;  Laterality: N/A;  . PERIPHERAL VASCULAR CATHETERIZATION N/A 11/17/2015   Procedure: Upper Extremity Angiography;  Surgeon: Wellington Hampshire, MD;  Location: [place] [facility];  Service: Cardiovascular;  Laterality: N/A;    Current Outpatient Prescriptions  Medication Sig Dispense Refill  . albuterol (PROVENTIL HFA;VENTOLIN HFA) 108 (90 Base) MCG/ACT inhaler Inhale 2 puffs into the lungs 4 (four) times daily as needed for wheezing or shortness of breath. 1 Inhaler 11  . amiodarone (PACERONE) 200 MG tablet Take 1 tablet (200 mg total) by mouth 2 (two) times daily. (Patient taking differently: Take 200 mg by mouth 2 (two) times daily. Dr Delmer Islam) 60 tablet 5  . amLODipine (NORVASC) 5 MG tablet TAKE 1 TABLET (5 MG TOTAL) BY MOUTH ONCE DAILY.  11  . diltiazem (CARDIZEM) 30 MG tablet Take 1 tablet (30 mg total) by mouth 3 (three) times daily. (Patient taking differently: Take 30 mg by  mouth 3 (three) times daily. Dr Delmer Islam) 90 tablet 3  . enalapril (VASOTEC) 20 MG tablet Take 20 mg by mouth daily. Dr Delmer Islam    . fluticasone (FLONASE) 50 MCG/ACT nasal spray Place 2 sprays into both nostrils daily. 16 g 0  . Fluticasone-Salmeterol (ADVAIR DISKUS) 250-50 MCG/DOSE AEPB INHALE 1 DOSE BY MOUTH TWICE DAILY. RINSE MOUTH AFTER USE 60 each 11  . hydrochlorothiazide (HYDRODIURIL) 25 MG tablet TAKE 1 TABLET BY MOUTH EVERY DAY. NEED APPT FOR REFILLS 90 tablet 2  . hydroxychloroquine (PLAQUENIL) 200 MG tablet Take 200 mg by mouth daily. Dr Delmer Islam    . meloxicam (MOBIC) 15 MG tablet Take 1 tablet (15 mg total) by mouth daily. 30 tablet 0  . metoprolol (LOPRESSOR) 50 MG tablet Take 75 mg by mouth 2 (two) times daily. Dr Delmer Islam    . mycophenolate (CELLCEPT) 500 MG tablet Take 500 mg by mouth 2 (two) times daily.  11  . NITRO-BID 2 % ointment Dr Precious Reel    . predniSONE (DELTASONE) 20 MG tablet TAKE 1 TABLET (20 MG TOTAL) BY MOUTH DAILY WITH BREAKFAST. (Patient taking differently: Take 15 mg by mouth daily with breakfast. Dr Precious Reel) 30 tablet 0  . rivaroxaban (XARELTO) 20 MG TABS tablet Take 1 tablet (20 mg total) by mouth daily with supper. (Patient taking differently: Take 20 mg by  mouth daily with supper. Dr Delmer Islam) 90 tablet 3   No current facility-administered medications for this visit.      Allergies:   Patient has no known allergies.   Social History:  The patient  reports that he quit smoking about 11 years ago. His smoking use included Cigarettes. He has a 20.00 pack-year smoking history. He has never used smokeless tobacco. He reports that he does not drink alcohol or use drugs.   Family History:   family history includes Leukemia in his mother.    Review of Systems: Review of Systems  Constitutional: Negative.   Respiratory: Negative.   Cardiovascular: Negative.   Gastrointestinal: Negative.   Musculoskeletal: Negative.   Skin:       Sores on his  fingers bilaterally  Neurological: Negative.   Psychiatric/Behavioral: Negative.   All other systems reviewed and are negative.    PHYSICAL EXAM: VS:  BP 118/76 (BP Location: Left Arm, Patient Position: Sitting, Cuff Size: Normal)   Pulse 62   Ht 5\' 10"  (1.778 m)   Wt 228 lb 4 oz (103.5 kg)   BMI 32.75 kg/m  , BMI Body mass index is 32.75 kg/m. GEN: Well nourished, well developed, in no acute distress  HEENT: normal  Neck: no JVD, carotid bruits, or masses Cardiac: RRR; no murmurs, rubs, or gallops,no edema , decreased ulnar pulses bilaterally Respiratory:  Rales bilaterally mid lung field,  normal work of breathing GI: soft, nontender, nondistended, + BS MS: no deformity or atrophy  Skin: warm and dry, no rash, sores on distal fingers both hands Neuro:  Strength and sensation are intact Psych: euthymic mood, full affect    Recent Labs: 11/29/2015: Magnesium 2.0 04/11/2016: ALT 30; BUN 25; Creatinine, Ser 1.10; Hemoglobin 13.9; Platelets 182; Potassium 3.4; Sodium 139    Lipid Panel Lab Results  Component Value Date   CHOL 88 08/25/2014   HDL 24 (L) 08/25/2014   LDLCALC 39 08/25/2014   TRIG 124 08/25/2014      Wt Readings from Last 3 Encounters:  05/08/16 228 lb 4 oz (103.5 kg)  04/11/16 226 lb 6.6 oz (102.7 kg)  04/03/16 230 lb (104.3 kg)       ASSESSMENT AND PLAN:  Tachycardia-induced cardiomyopathy ([place]) Most recent echocardiogram with normal ejection fraction  Typical atrial flutter (HCC) - Plan: EKG 12-Lead Maintaining normal sinus rhythm Tolerating anticoagulation If he has recurrence of his atrial flutter, would consider ablation  Unstable angina - left-sided shoulder/chest pain occurring at rest  Pulmonary fibrosis (HCC) Seen on CT scan and appreciated on exam  Gangrene of finger (HCC) Acceptable risk for surgery this week at [facility] Recommended he hold anticoagulation 40 hours prior to the procedure Would restart anticoagulation  once given the release to do so by surgery  Rheumatoid arthritis, involving unspecified site, unspecified rheumatoid factor presence ([place])  Vasculitis ([place]) Treated by Dr.kernodle,  Scheduled for amputation tolerable one of his fingers   Total encounter time more than 25 minutes  Greater than 50% was spent in counseling and coordination of care with the patient  Disposition:   F/U  6 months   Orders Placed This Encounter  Procedures  . EKG 12-Lead     Signed, Esmond Plants, M.D., Ph.D. 05/08/2016  [place], [place]

## 2016-05-08 NOTE — Telephone Encounter (Signed)
Please fax when available clearance to hold to [phone].

## 2016-05-08 NOTE — Telephone Encounter (Signed)
[facility] NP calling to check status on xarelto hold instructions for surgery on 12/14. .  If ok to hold. Patient has an appt today with gollan.  NP aware Instructions will be given to the patient  at that time.

## 2016-05-08 NOTE — Telephone Encounter (Signed)
Do we have any openings anymore?

## 2016-05-08 NOTE — Patient Instructions (Addendum)
Medication Instructions:   Stop the xarelto Tuesday, 2 days before the procedure Restart xarelto when ok with the surgeon, no earlier than Friday  After surgery is complete and you get home, Decrease amiodarone down to one a day  Hold the enalapril and HCTZ the morning of the procedure  Labwork:  No new labs needed  Testing/Procedures:  No further testing at this time   I recommend watching educational videos on topics of interest to you at:       [email or web address].com  Enter code: [email or web address]    Follow-Up: It was a pleasure seeing you in the office today. Please call us if you have new issues that need to be addressed before your next appt.  [phone]  Your physician wants you to follow-up in: 6 months.  You will receive a reminder letter in the mail two months in advance. If you don't receive a letter, please call our office to schedule the follow-up appointment.  If you need a refill on your cardiac medications before your next appointment, please call your pharmacy.

## 2016-05-09 ENCOUNTER — Other Ambulatory Visit: Payer: Self-pay | Admitting: Cardiovascular Disease

## 2016-05-09 NOTE — Telephone Encounter (Signed)
Robin with [facility] requesting fax of cardiac Clearance.  Routed via epic last ov note stating clearance.

## 2016-05-12 ENCOUNTER — Ambulatory Visit: Payer: 59

## 2016-05-26 ENCOUNTER — Ambulatory Visit: Payer: 59 | Admitting: Family Medicine

## 2016-06-07 ENCOUNTER — Other Ambulatory Visit: Payer: Self-pay | Admitting: Cardiovascular Disease

## 2016-06-20 ENCOUNTER — Other Ambulatory Visit: Payer: Self-pay | Admitting: Cardiovascular Disease

## 2016-06-23 ENCOUNTER — Encounter: Payer: Self-pay | Admitting: Family Medicine

## 2016-06-23 ENCOUNTER — Ambulatory Visit (INDEPENDENT_AMBULATORY_CARE_PROVIDER_SITE_OTHER): Payer: 59 | Admitting: Family Medicine

## 2016-06-23 VITALS — BP 120/70 | HR 80 | Ht 70.0 in | Wt 224.0 lb

## 2016-06-23 DIAGNOSIS — M351 Other overlap syndromes: Secondary | ICD-10-CM

## 2016-06-23 DIAGNOSIS — I483 Typical atrial flutter: Secondary | ICD-10-CM

## 2016-06-23 DIAGNOSIS — G509 Disorder of trigeminal nerve, unspecified: Secondary | ICD-10-CM

## 2016-06-23 DIAGNOSIS — Z872 Personal history of diseases of the skin and subcutaneous tissue: Secondary | ICD-10-CM

## 2016-06-23 DIAGNOSIS — J8410 Pulmonary fibrosis, unspecified: Secondary | ICD-10-CM

## 2016-06-23 DIAGNOSIS — I776 Arteritis, unspecified: Secondary | ICD-10-CM | POA: Diagnosis not present

## 2016-06-23 DIAGNOSIS — I7300 Raynaud's syndrome without gangrene: Secondary | ICD-10-CM

## 2016-06-23 DIAGNOSIS — Z8679 Personal history of other diseases of the circulatory system: Secondary | ICD-10-CM

## 2016-06-23 NOTE — Progress Notes (Signed)
Name: [name]   MRN: [id number]    DOB: 27-Sep-1960   Date:06/23/2016       Progress Note  Subjective  Chief Complaint  Chief Complaint  Patient presents with  . Numbness    feels like L) side of mouth, chin and nose is numb x 1 week- hasn't really eat anything out of the ordinary.     Neurologic Problem  The patient's primary symptoms include focal sensory loss. The patient's pertinent negatives include no altered mental status, clumsiness, focal weakness, loss of balance, memory loss, near-syncope, slurred speech, syncope, visual change or weakness. This is a new problem. The current episode started 1 to 4 weeks ago. The neurological problem developed suddenly. The problem has been gradually worsening (progressing mandibular to maxillary) since onset. There was left-sided and facial focality noted. Pertinent negatives include no abdominal pain, auditory change, back pain, chest pain, dizziness, fever, headaches, light-headedness, nausea, neck pain, palpitations or shortness of breath. Treatments tried: xeralto. The treatment provided mild relief. His past medical history is significant for a clotting disorder. There is no history of a bleeding disorder, a CVA, dementia, head trauma, liver disease, mood changes or seizures.    No problem-specific Assessment & Plan notes found for this encounter.   Past Medical History:  Diagnosis Date  . Anemia 10/2015  . Atrial flutter ([place])    a. on Xarelto; b. s/p successful TEE/DCCV on 08/28/2014  . Gangrene of finger ([place])   . History of nuclear stress test    a. 09/02/2014: no sig ischemia, GI uptake noted, no sig WMA, EF 48%, no EKG chanes concerning for ischemia, low risk scan    . HTN (hypertension)   . Normal coronary arteries    a. cardiac cath 12/03/2014: no sigificant CAD, right dominant system, LVEF >55%, no MR or AS  . Pulmonary fibrosis ([place])    a. not on home oxygen  . RA (rheumatoid arthritis) ([place])   . Tachycardia-induced  cardiomyopathy ([place])    a. echo 07/2014: EF 20-25%, cannot exclude atrial flutter,  global HK, possible bicuspid Ao valve, mod reduced RV sys fxn, mild-mod aortic valve sclerosis/calcification, mod TR, mildly elevated RVSP; b. TEE 08/28/2014: EF 30-35%, no intracardic thrombus, mildly dilated LA/RA, mild TR, mild-mod aortic sclerosis w/o stenosis; c. echo 09/2014: EF 55-60%, select images w/ inf HK    Past Surgical History:  Procedure Laterality Date  . CARDIAC CATHETERIZATION N/A 12/03/2014   Procedure: Left Heart Cath and Coronary Angiography;  Surgeon: Minna Merritts, MD;  Location: [place] [facility];  Service: Cardiovascular;  Laterality: N/A;  . COLONOSCOPY WITH PROPOFOL N/A 11/23/2015   Procedure: COLONOSCOPY WITH PROPOFOL;  Surgeon: [name], MD;  Location: [facility] ENDOSCOPY;  Service: Endoscopy;  Laterality: N/A;  . ESOPHAGOGASTRODUODENOSCOPY (EGD) WITH PROPOFOL N/A 11/23/2015   Procedure: ESOPHAGOGASTRODUODENOSCOPY (EGD) WITH PROPOFOL;  Surgeon: [name], MD;  Location: [facility] ENDOSCOPY;  Service: Endoscopy;  Laterality: N/A;  . PERIPHERAL VASCULAR CATHETERIZATION N/A 11/17/2015   Procedure: Upper Extremity Angiography;  Surgeon: Wellington Hampshire, MD;  Location: [place] [facility];  Service: Cardiovascular;  Laterality: N/A;    Family History  Problem Relation Age of Onset  . Leukemia Mother     Social History   Social History  . Marital status: Married    Spouse name: N/A  . Number of children: N/A  . Years of education: N/A   Occupational History  . Not on file.   Social History Main Topics  . Smoking  status: Former Smoker    Packs/day: 1.00    Years: 20.00    Types: Cigarettes    Quit date: 08/30/2004  . Smokeless tobacco: Never Used  . Alcohol use No  . Drug use: No  . Sexual activity: Not on file   Other Topics Concern  . Not on file   Social History Narrative  . No narrative on file    No Known Allergies   Review of Systems  Constitutional: Negative for  chills, fever, malaise/fatigue and weight loss.  HENT: Negative for ear discharge, ear pain and sore throat.   Eyes: Negative for blurred vision.  Respiratory: Negative for cough, sputum production, shortness of breath and wheezing.   Cardiovascular: Negative for chest pain, palpitations, leg swelling and near-syncope.  Gastrointestinal: Negative for abdominal pain, blood in stool, constipation, diarrhea, heartburn, melena and nausea.  Genitourinary: Negative for dysuria, frequency, hematuria and urgency.  Musculoskeletal: Negative for back pain, joint pain, myalgias and neck pain.  Skin: Negative for rash.  Neurological: Positive for tingling and sensory change. Negative for dizziness, speech change, focal weakness, syncope, weakness, light-headedness, headaches and loss of balance.  Endo/Heme/Allergies: Negative for environmental allergies and polydipsia. Does not bruise/bleed easily.  Psychiatric/Behavioral: Negative for depression, memory loss and suicidal ideas. The patient is not nervous/anxious and does not have insomnia.      Objective  Vitals:   06/23/16 1511  BP: 120/70  Pulse: 80  Weight: 224 lb (101.6 kg)  Height: 5\' 10"  (1.778 m)    Physical Exam  Constitutional: He is oriented to person, place, and time and well-developed, well-nourished, and in no distress.  HENT:  Head: Normocephalic.  Right Ear: External ear normal.  Left Ear: External ear normal.  Nose: Nose normal.  Mouth/Throat: Oropharynx is clear and moist.  Eyes: Conjunctivae and EOM are normal. Pupils are equal, round, and reactive to light. Right eye exhibits no discharge. Left eye exhibits no discharge. No scleral icterus.  Neck: Normal range of motion. Neck supple. No JVD present. No tracheal deviation present. No thyromegaly present.  Cardiovascular: Normal rate, regular rhythm, normal heart sounds and intact distal pulses.  Exam reveals no gallop and no friction rub.   No murmur  heard. Pulmonary/Chest: Breath sounds normal. No respiratory distress. He has no wheezes. He has no rales.  Abdominal: Soft. Bowel sounds are normal. He exhibits no mass. There is no hepatosplenomegaly. There is no tenderness. There is no rebound, no guarding and no CVA tenderness.  Musculoskeletal: Normal range of motion. He exhibits no edema or tenderness.  Lymphadenopathy:    He has no cervical adenopathy.  Neurological: He is alert and oriented to person, place, and time. He has normal strength and normal reflexes. A cranial nerve deficit and sensory deficit is present.  Skin: Skin is warm. No rash noted.  Psychiatric: Mood and affect normal.  Nursing note and vitals reviewed.     Assessment & Plan  Problem List Items Addressed This Visit      Cardiovascular and Mediastinum   Atrial flutter ([place])   Vasculitis ([place])   Relevant Orders   US Carotid Duplex Bilateral     Respiratory   Pulmonary fibrosis ([place])    Other Visit Diagnoses    Trigeminal nerve disorder    -  Primary   cva(vasculitis /atheroscerosis/embolic)   Relevant Orders   Ambulatory referral to Neurology   US Carotid Duplex Bilateral   Raynaud's phenomenon without gangrene       Hx of gangrene  finger   Mixed connective tissue disease ([place])            Dr. Macon [facility]  06/23/16

## 2016-07-08 DIAGNOSIS — G500 Trigeminal neuralgia: Secondary | ICD-10-CM | POA: Insufficient documentation

## 2016-07-08 DIAGNOSIS — I639 Cerebral infarction, unspecified: Secondary | ICD-10-CM

## 2016-07-10 ENCOUNTER — Other Ambulatory Visit: Payer: Self-pay | Admitting: Neurology

## 2016-07-10 DIAGNOSIS — R200 Anesthesia of skin: Secondary | ICD-10-CM

## 2016-07-18 ENCOUNTER — Inpatient Hospital Stay: Payer: 59

## 2016-07-18 ENCOUNTER — Inpatient Hospital Stay: Payer: 59 | Admitting: Internal Medicine

## 2016-07-20 ENCOUNTER — Ambulatory Visit: Payer: 59

## 2016-07-25 ENCOUNTER — Inpatient Hospital Stay: Payer: 59

## 2016-07-25 ENCOUNTER — Inpatient Hospital Stay: Payer: 59 | Attending: Internal Medicine | Admitting: Internal Medicine

## 2016-07-25 VITALS — BP 143/77 | HR 74 | Temp 98.8°F | Resp 18 | Ht 70.0 in | Wt 225.0 lb

## 2016-07-25 DIAGNOSIS — D594 Other nonautoimmune hemolytic anemias: Secondary | ICD-10-CM

## 2016-07-25 DIAGNOSIS — Z79899 Other long term (current) drug therapy: Secondary | ICD-10-CM

## 2016-07-25 DIAGNOSIS — M069 Rheumatoid arthritis, unspecified: Secondary | ICD-10-CM | POA: Insufficient documentation

## 2016-07-25 DIAGNOSIS — D591 Other autoimmune hemolytic anemias: Secondary | ICD-10-CM | POA: Insufficient documentation

## 2016-07-25 DIAGNOSIS — Z87891 Personal history of nicotine dependence: Secondary | ICD-10-CM | POA: Insufficient documentation

## 2016-07-25 DIAGNOSIS — I10 Essential (primary) hypertension: Secondary | ICD-10-CM | POA: Insufficient documentation

## 2016-07-25 DIAGNOSIS — I4891 Unspecified atrial fibrillation: Secondary | ICD-10-CM | POA: Diagnosis not present

## 2016-07-25 DIAGNOSIS — Z7901 Long term (current) use of anticoagulants: Secondary | ICD-10-CM

## 2016-07-25 DIAGNOSIS — I4892 Unspecified atrial flutter: Secondary | ICD-10-CM | POA: Insufficient documentation

## 2016-07-25 DIAGNOSIS — I429 Cardiomyopathy, unspecified: Secondary | ICD-10-CM | POA: Insufficient documentation

## 2016-07-25 LAB — CBC WITH DIFFERENTIAL/PLATELET
Basophils Absolute: 0 10*3/uL (ref 0–0.1)
Basophils Relative: 1 %
EOS ABS: 0.1 10*3/uL (ref 0–0.7)
Eosinophils Relative: 1 %
HEMATOCRIT: 44.1 % (ref 40.0–52.0)
HEMOGLOBIN: 14.5 g/dL (ref 13.0–18.0)
LYMPHS ABS: 1.4 10*3/uL (ref 1.0–3.6)
LYMPHS PCT: 15 %
MCH: 27.5 pg (ref 26.0–34.0)
MCHC: 32.8 g/dL (ref 32.0–36.0)
MCV: 83.9 fL (ref 80.0–100.0)
Monocytes Absolute: 0.6 10*3/uL (ref 0.2–1.0)
Monocytes Relative: 7 %
NEUTROS ABS: 7.3 10*3/uL — AB (ref 1.4–6.5)
NEUTROS PCT: 76 %
Platelets: 229 10*3/uL (ref 150–440)
RBC: 5.26 MIL/uL (ref 4.40–5.90)
RDW: 15.4 % — ABNORMAL HIGH (ref 11.5–14.5)
WBC: 9.5 10*3/uL (ref 3.8–10.6)

## 2016-07-25 LAB — COMPREHENSIVE METABOLIC PANEL
ALT: 39 U/L (ref 17–63)
AST: 25 U/L (ref 15–41)
Albumin: 3.8 g/dL (ref 3.5–5.0)
Alkaline Phosphatase: 49 U/L (ref 38–126)
Anion gap: 9 (ref 5–15)
BILIRUBIN TOTAL: 0.4 mg/dL (ref 0.3–1.2)
BUN: 21 mg/dL — ABNORMAL HIGH (ref 6–20)
CALCIUM: 9.2 mg/dL (ref 8.9–10.3)
CO2: 27 mmol/L (ref 22–32)
Chloride: 102 mmol/L (ref 101–111)
Creatinine, Ser: 1.02 mg/dL (ref 0.61–1.24)
GFR calc non Af Amer: >60 mL/min (ref >60)
GLUCOSE: 84 mg/dL (ref 65–99)
Potassium: 3.6 mmol/L (ref 3.5–5.1)
SODIUM: 138 mmol/L (ref 135–145)
Total Protein: 7.3 g/dL (ref 6.5–8.1)

## 2016-07-25 LAB — LACTATE DEHYDROGENASE: LDH: 214 U/L — ABNORMAL HIGH (ref 98–192)

## 2016-07-25 NOTE — Progress Notes (Signed)
[facility] OFFICE PROGRESS NOTE  Patient Care Team: Juline Patch, MD as PCP - General (Family Medicine) Minna Merritts, MD as Consulting Physician (Cardiology)    No history exists.   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  male [facility] patient above history of Autoimmune/connective tissue disorder; And recently diagnosed acute hemolytic anemia is here for follow-up. Patient is currently on prednisone 20 mg [as per rheumatology]; Also started on CellCept 500 BID through rheumatology.   # s/p left hand peri-arterial synpathetetcomy At [facility].. Patient feels back to his baseline.  No difficulty swallowing pain with swallowing. Denies any bleeding.  REVIEW OF SYSTEMS:  A complete 10 point review of system is done which is negative except mentioned above/history of present illness.   PAST MEDICAL HISTORY :  Past Medical History:  Diagnosis Date  . Anemia 10/2015  . Atrial flutter ([place])    a. on Xarelto; b. s/p successful TEE/DCCV on 08/28/2014  . Gangrene of finger ([place])   . History of nuclear stress test    a. 09/02/2014: no sig ischemia, GI uptake noted, no sig WMA, EF 48%, no EKG chanes concerning for ischemia, low risk scan    . HTN (hypertension)   . Normal coronary arteries    a. cardiac cath 12/03/2014: no sigificant CAD, right dominant system, LVEF >55%, no MR or AS  . Pulmonary fibrosis ([place])    a. not on home oxygen  . RA (rheumatoid arthritis) ([place])   . 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 echo 07/2014: EF 20-25%, cannot exclude atrial flutter,  global HK, possible bicuspid Ao valve, mod reduced RV  sys fxn, mild-mod aortic valve sclerosis/calcification, mod TR, mildly elevated RVSP; b. TEE 08/28/2014: EF 30-35%, no intracardic thrombus, mildly dilated LA/RA, mild TR, mild-mod aortic sclerosis w/o stenosis; c. echo 09/2014: EF 55-60%, select images w/ inf HK    PAST SURGICAL HISTORY :   Past Surgical History:  Procedure Laterality Date  . CARDIAC CATHETERIZATION N/A 12/03/2014   Procedure: Left Heart Cath and Coronary Angiography;  Surgeon: Minna Merritts, MD;  Location: [facility];  Service: Cardiovascular;  Laterality: N/A;  . COLONOSCOPY WITH PROPOFOL N/A 11/23/2015   Procedure: COLONOSCOPY WITH PROPOFOL;  Surgeon: [name], MD;  Location: [facility];  Service: Endoscopy;  Laterality: N/A;  . ESOPHAGOGASTRODUODENOSCOPY (EGD) WITH PROPOFOL N/A 11/23/2015   Procedure: ESOPHAGOGASTRODUODENOSCOPY (EGD) WITH PROPOFOL;  Surgeon: [name], MD;  Location: [facility];  Service: Endoscopy;  Laterality: N/A;  . PERIPHERAL VASCULAR CATHETERIZATION N/A 11/17/2015   Procedure: Upper Extremity Angiography;  Surgeon: Wellington Hampshire, MD;  Location: [facility];  Service: Cardiovascular;  Laterality: N/A;    FAMILY HISTORY :   Family History  Problem Relation Age of Onset  . Leukemia Mother     SOCIAL HISTORY:   Social History  Substance Use Topics  . Smoking status: Former Smoker    Packs/day: 1.00    Years: 20.00    Types: Cigarettes    Quit date: 08/30/2004  . Smokeless tobacco: Never Used  . Alcohol use No    ALLERGIES:  has No Known Allergies.  MEDICATIONS:  Current Outpatient Prescriptions  Medication Sig Dispense Refill  .  albuterol (PROVENTIL HFA;VENTOLIN HFA) 108 (90 Base) MCG/ACT inhaler Inhale 2 puffs into the lungs 4 (four) times daily as needed for wheezing or shortness of breath. 1 Inhaler 11  . amiodarone (PACERONE) 200 MG tablet Take 1 tablet (200 mg total) by mouth daily. 30 tablet 3  . amLODipine (NORVASC) 5 MG tablet TAKE 1 TABLET (5 MG  TOTAL) BY MOUTH ONCE DAILY.  11  . diltiazem (CARDIZEM) 30 MG tablet TAKE 1 TABLET (30 MG TOTAL) BY MOUTH 3 (THREE) TIMES DAILY. 90 tablet 3  . enalapril (VASOTEC) 20 MG tablet Take 20 mg by mouth daily. Dr Delmer Islam    . fluticasone (FLONASE) 50 MCG/ACT nasal spray Place 2 sprays into both nostrils daily. 16 g 0  . Fluticasone-Salmeterol (ADVAIR DISKUS) 250-50 MCG/DOSE AEPB INHALE 1 DOSE BY MOUTH TWICE DAILY. RINSE MOUTH AFTER USE 60 each 11  . hydrochlorothiazide (HYDRODIURIL) 25 MG tablet TAKE 1 TABLET BY MOUTH EVERY DAY. NEED APPT FOR REFILLS 90 tablet 2  . hydroxychloroquine (PLAQUENIL) 200 MG tablet Take 200 mg by mouth daily. Dr Delmer Islam    . meloxicam (MOBIC) 15 MG tablet Take 1 tablet (15 mg total) by mouth daily. 30 tablet 0  . metoprolol (LOPRESSOR) 50 MG tablet Take 75 mg by mouth 2 (two) times daily. Dr Delmer Islam    . mycophenolate (CELLCEPT) 500 MG tablet Take 500 mg by mouth 2 (two) times daily.  11  . predniSONE (DELTASONE) 20 MG tablet TAKE 1 TABLET (20 MG TOTAL) BY MOUTH DAILY WITH BREAKFAST. 30 tablet 0  . XARELTO 20 MG TABS tablet TAKE 1 TABLET BY MOUTH EVERY MORNING 30 tablet 3  . NITRO-BID 2 % ointment Apply 0.5 inches topically daily. Dr Precious Reel     No current facility-administered medications for this visit.     PHYSICAL EXAMINATION:   BP (!) 143/77   Pulse 74   Temp 98.8 F (37.1 C) (Tympanic)   Resp 18   Ht 5\' 10"  (1.778 m)   Wt 225 lb (102.1 kg)   BMI 32.28 kg/m   Filed Weights   07/25/16 1000  Weight: 225 lb (102.1 kg)    GENERAL: Well-nourished well-developed; Alert, no distress and comfortable.  Alone.  EYES: no pallor or icterus OROPHARYNX: no thrush or ulceration; good dentition  NECK: supple, no masses felt LYMPH:  no palpable lymphadenopathy in the cervical, axillary or inguinal regions LUNGS: clear to auscultation and  No wheeze or crackles HEART/CVS: regular rate & rhythm and no murmurs; No lower extremity edema ABDOMEN:abdomen soft,  non-tender and normal bowel sounds Musculoskeletal:no cyanosis of digits and no clubbing  PSYCH: alert & oriented x 3 with fluent speech NEURO: no focal motor/sensory deficits SKIN:  no rashes or significant lesions  LABORATORY DATA:  I have reviewed the data as listed    Component Value Date/Time   NA 138 07/25/2016 0943   NA 138 12/11/2014 1518   NA 137 08/27/2014 0403   K 3.6 07/25/2016 0943   K 4.1 08/27/2014 0403   CL 102 07/25/2016 0943   CL 108 08/27/2014 0403   CO2 27 07/25/2016 0943   CO2 26 08/27/2014 0403   GLUCOSE 84 07/25/2016 0943   GLUCOSE 94 08/27/2014 0403   BUN 21 (H) 07/25/2016 0943   BUN 24 12/11/2014 1518   BUN 16 08/27/2014 0403   CREATININE 1.02 07/25/2016 0943   CREATININE 0.85 08/27/2014 0403   CALCIUM 9.2 07/25/2016 0943   CALCIUM 8.6 (L) 08/27/2014 0403   PROT 7.3  07/25/2016 0943   ALBUMIN 3.8 07/25/2016 0943   AST 25 07/25/2016 0943   ALT 39 07/25/2016 0943   ALKPHOS 49 07/25/2016 0943   BILITOT 0.4 07/25/2016 0943   GFRNONAA >60 07/25/2016 0943   GFRNONAA >60 08/27/2014 0403   GFRAA >60 07/25/2016 0943   GFRAA >60 08/27/2014 0403    No results found for: SPEP, UPEP  Lab Results  Component Value Date   WBC 9.5 07/25/2016   NEUTROABS 7.3 (H) 07/25/2016   HGB 14.5 07/25/2016   HCT 44.1 07/25/2016   MCV 83.9 07/25/2016   PLT 229 07/25/2016      Chemistry      Component Value Date/Time   NA 138 07/25/2016 0943   NA 138 12/11/2014 1518   NA 137 08/27/2014 0403   K 3.6 07/25/2016 0943   K 4.1 08/27/2014 0403   CL 102 07/25/2016 0943   CL 108 08/27/2014 0403   CO2 27 07/25/2016 0943   CO2 26 08/27/2014 0403   BUN 21 (H) 07/25/2016 0943   BUN 24 12/11/2014 1518   BUN 16 08/27/2014 0403   CREATININE 1.02 07/25/2016 0943   CREATININE 0.85 08/27/2014 0403      Component Value Date/Time   CALCIUM 9.2 07/25/2016 0943   CALCIUM 8.6 (L) 08/27/2014 0403   ALKPHOS 49 07/25/2016 0943   AST 25 07/25/2016 0943   ALT 39 07/25/2016 0943    BILITOT 0.4 07/25/2016 0943       RADIOGRAPHIC STUDIES: I have personally reviewed the radiological images as listed and agreed with the findings in the report. No results found.   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 Also on cellcept 500 BID [for connective tissue disorder; as per rheumatology].   # Left hand small vessel ischemia s/p hand sympathetectomy/ History of A. Fib- on Xarelto.  #follow up in 3 months/labs.    Orders Placed This Encounter  Procedures  . CBC with Differential    Standing Status:   Future    Standing Expiration Date:   07/25/2017  . Lactate dehydrogenase    Standing Status:   Future    Standing Expiration Date:   07/25/2017  . Basic metabolic panel    Standing Status:   Future    Standing Expiration Date:   07/25/2017   All questions were answered. The patient knows to call the clinic with any problems, questions or concerns.      Cammie Sickle, MD 07/25/2016 10:34 AM

## 2016-07-25 NOTE — Assessment & Plan Note (Addendum)
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 Also on cellcept 500 BID [for connective tissue disorder; as per rheumatology].   # Left hand small vessel ischemia s/p hand sympathetectomy/ History of A. Fib- on Xarelto.  #follow up in 3 months/labs.

## 2016-07-28 ENCOUNTER — Telehealth: Payer: Self-pay | Admitting: Cardiovascular Disease

## 2016-07-28 NOTE — Telephone Encounter (Signed)
Patient requesting samples of XARELTO 20 MG TABS tablet. Please call when ready for pickup.

## 2016-07-28 NOTE — Telephone Encounter (Signed)
Xarelto 20 mg tablet placed at front desk.

## 2016-08-08 ENCOUNTER — Encounter: Payer: Self-pay | Admitting: Gastroenterology

## 2016-08-08 ENCOUNTER — Ambulatory Visit (INDEPENDENT_AMBULATORY_CARE_PROVIDER_SITE_OTHER): Payer: BLUE CROSS/BLUE SHIELD | Admitting: Gastroenterology

## 2016-08-08 ENCOUNTER — Other Ambulatory Visit: Payer: Self-pay

## 2016-08-08 VITALS — BP 122/69 | HR 62 | Temp 98.2°F | Ht 70.0 in | Wt 228.0 lb

## 2016-08-08 DIAGNOSIS — R112 Nausea with vomiting, unspecified: Secondary | ICD-10-CM

## 2016-08-08 DIAGNOSIS — G5600 Carpal tunnel syndrome, unspecified upper limb: Secondary | ICD-10-CM | POA: Insufficient documentation

## 2016-08-08 DIAGNOSIS — K219 Gastro-esophageal reflux disease without esophagitis: Secondary | ICD-10-CM | POA: Diagnosis not present

## 2016-08-08 DIAGNOSIS — IMO0002 Reserved for concepts with insufficient information to code with codable children: Secondary | ICD-10-CM | POA: Insufficient documentation

## 2016-08-08 NOTE — Progress Notes (Signed)
Primary Care Physician: Otilio Miu, MD  Primary Gastroenterologist:  Dr. [name]  Chief Complaint  Patient presents with  . Gastroesophageal Reflux    HPI: [name] is a 56 y.o. male here for symptoms of acid reflux. The patient is not presently taking anything for acid reflux and has a history of having an upper endoscopy with diabetes and a hiatal hernia seen. The patient denies any unexplained weight loss and states he has actually gained weight. There is no report of any change in bowel habits. The patient reports that he has nausea with vomiting of food many hours after he eats. He denies any history of diabetes. He states that his symptoms typically happen at night.  Current Outpatient Prescriptions  Medication Sig Dispense Refill  . albuterol (PROVENTIL HFA;VENTOLIN HFA) 108 (90 Base) MCG/ACT inhaler Inhale 2 puffs into the lungs 4 (four) times daily as needed for wheezing or shortness of breath. 1 Inhaler 11  . amiodarone (PACERONE) 200 MG tablet Take 1 tablet (200 mg total) by mouth daily. 30 tablet 3  . amLODipine (NORVASC) 5 MG tablet TAKE 1 TABLET (5 MG TOTAL) BY MOUTH ONCE DAILY.  11  . diltiazem (CARDIZEM) 30 MG tablet TAKE 1 TABLET (30 MG TOTAL) BY MOUTH 3 (THREE) TIMES DAILY. 90 tablet 3  . enalapril (VASOTEC) 20 MG tablet Take 20 mg by mouth daily. Dr Delmer Islam    . fluticasone (FLONASE) 50 MCG/ACT nasal spray Place 2 sprays into both nostrils daily. 16 g 0  . Fluticasone-Salmeterol (ADVAIR DISKUS) 250-50 MCG/DOSE AEPB INHALE 1 DOSE BY MOUTH TWICE DAILY. RINSE MOUTH AFTER USE 60 each 11  . hydrochlorothiazide (HYDRODIURIL) 25 MG tablet TAKE 1 TABLET BY MOUTH EVERY DAY. NEED APPT FOR REFILLS 90 tablet 2  . hydroxychloroquine (PLAQUENIL) 200 MG tablet Take 200 mg by mouth daily. Dr Delmer Islam    . meloxicam (MOBIC) 15 MG tablet Take 1 tablet (15 mg total) by mouth daily. 30 tablet 0  . metoprolol (LOPRESSOR) 50 MG tablet Take 75 mg by mouth 2 (two) times  daily. Dr Delmer Islam    . mycophenolate (CELLCEPT) 500 MG tablet Take 500 mg by mouth 2 (two) times daily.  11  . NITRO-BID 2 % ointment Apply 0.5 inches topically daily. Dr Precious Reel    . predniSONE (DELTASONE) 20 MG tablet TAKE 1 TABLET (20 MG TOTAL) BY MOUTH DAILY WITH BREAKFAST. 30 tablet 0  . XARELTO 20 MG TABS tablet TAKE 1 TABLET BY MOUTH EVERY MORNING 30 tablet 3   No current facility-administered medications for this visit.     Allergies as of 08/08/2016  . (No Known Allergies)    ROS:  General: Negative for anorexia, weight loss, fever, chills, fatigue, weakness. ENT: Negative for hoarseness, difficulty swallowing , nasal congestion. CV: Negative for chest pain, angina, palpitations, dyspnea on exertion, peripheral edema.  Respiratory: Negative for dyspnea at rest, dyspnea on exertion, cough, sputum, wheezing.  GI: See history of present illness. GU:  Negative for dysuria, hematuria, urinary incontinence, urinary frequency, nocturnal urination.  Endo: Negative for unusual weight change.    Physical Examination:   BP 122/69   Pulse 62   Temp 98.2 F (36.8 C) (Oral)   Ht 5\' 10"  (1.778 m)   Wt 228 lb (103.4 kg)   BMI 32.71 kg/m   General: Well-nourished, well-developed in no acute distress.  Eyes: No icterus. Conjunctivae pink. Mouth: Oropharyngeal mucosa moist and pink , no lesions erythema or exudate. Lungs: Clear to auscultation  bilaterally. Non-labored. Heart: Regular rate and rhythm, no murmurs rubs or gallops.  Abdomen: Bowel sounds are normal, nontender, nondistended, no hepatosplenomegaly or masses, no abdominal bruits or hernia , no rebound or guarding.   Extremities: No lower extremity edema. No clubbing or deformities. Neuro: Alert and oriented x 3.  Grossly intact. Skin: Warm and dry, no jaundice.   Psych: Alert and cooperative, normal mood and affect.  Labs:    Imaging Studies: No results found.  Assessment and Plan:   [name]  is a 56 y.o. y/o male who comes in today with symptoms of reflux and regurgitation. The patient also reports that he vomits in the evening 3 times a week with food undigested that he had eaten many hours before. The patient has a history of esophagitis. The patient also has some nausea associated with his symptoms. The patient will be started on a trial of Dexilant. If his symptoms improve then the patient will be sent a prescription for the [place]. If the patient's symptoms do not improve then he will need a gastric emptying study and a right upper quadrant ultrasound with a HIDA scan to look for gallbladder dysfunction    [name], MD. Marval Regal   Note: This dictation was prepared with Dragon dictation along with smaller phrase technology. Any transcriptional errors that result from this process are unintentional.

## 2016-08-22 ENCOUNTER — Telehealth: Payer: Self-pay | Admitting: Gastroenterology

## 2016-08-22 ENCOUNTER — Ambulatory Visit: Payer: 59 | Admitting: Gastroenterology

## 2016-08-22 ENCOUNTER — Other Ambulatory Visit: Payer: Self-pay

## 2016-08-22 MED ORDER — DEXLANSOPRAZOLE 60 MG PO CPDR: 60.0000 mg | DELAYED_RELEASE_CAPSULE | Freq: Every day | ORAL | 11 refills | Status: DC

## 2016-08-22 NOTE — Telephone Encounter (Signed)
[facility] in [place]  Patient was given samples of Dexilant and it's working fine and would like to have a RX called in

## 2016-08-22 NOTE — Telephone Encounter (Signed)
[facility] for Dexilant sent to [facility] per pt request.

## 2016-08-22 NOTE — Telephone Encounter (Signed)
Patient called and the dexlansoprazole (DEXILANT) 60 MG capsule is too expensive. Can he take a different medication?

## 2016-08-23 NOTE — Telephone Encounter (Signed)
Pt has not tried any other rx PPI's. Can we change him to Pantoprazole?

## 2016-08-24 ENCOUNTER — Other Ambulatory Visit: Payer: Self-pay

## 2016-08-24 MED ORDER — PANTOPRAZOLE SODIUM 40 MG PO TBEC
40.0000 mg | DELAYED_RELEASE_TABLET | Freq: Every day | ORAL | 5 refills | Status: DC
Start: 2016-08-24 — End: 2017-01-07

## 2016-08-24 NOTE — Telephone Encounter (Signed)
Patient called regarding his medication issue.

## 2016-08-24 NOTE — Telephone Encounter (Signed)
Pt notified rx was sent to pharmacy

## 2016-08-24 NOTE — Telephone Encounter (Signed)
Yes

## 2016-08-26 NOTE — Progress Notes (Signed)
Cardiology Office Note  Date:  08/29/2016   ID:  [name], DOB 1961-01-14, MRN [id number]  PCP:  Otilio Miu, MD   Chief Complaint  Patient presents with  . OTHER    Discuss Amidarone. Meds reviewed verbally with pt.    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 Raul Del, wants to stop amiodarone 6 min walk test down a little, dropped sats Now on oxygen  He is having some lower extremity edema left greater than right  Prior history ofSores on fingers b/l, Severe symptoms on one of his fingers, nonhealing ulcerations  EKG on today's visit shows normal sinus rhythm with rate 59 bpm, no significant ST-T wave changes  Other past medical history presented to the hospital 11/22/2015 with weakness, fall, hemoglobin of 5.5 Guiac negative, GI was consult and colonoscopy and EGD revealed no evidence of bleeding, Further evaluation by hematology confirmed hemolytic anemia, started on high-dose prednisone He did have transfusion 4  seen on a regular basis by Dr. Jefm Bryant for his mixed connective tissue disease Last visit January 2017 per the notes  Previous 30 day monitor that showed 3 episodes of PVCs in a bigeminal  manner. He was asymptomatic. Arrhythmia sometimes in the morning, other times in the early evening. Was not on a consistent basis.  admitted to [facility] in April of 2016 for 3 week history of increased dyspnea and palpitations and found to be in new onset atrial flutter.  TTE showed EF 20-25%, global HK, possible bicuspid aortic valve.  successful TEE/DCCV on 4/1. 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   Repeat TTE showed improved EF of 55-60%, select images suggestive of hypokinesis of the inferior and posterior myocardium. LV diastolic parameters were normal. Left atrium was normal in size. RV function was normal. PASP was normal. He was advised further work up if he has symptoms.   On 11/27/14 while at work he suddenly developed onset of palpitations that led to increased SOB, especially with ambulation causing patient to leave work early. Patient checked his pulse and found it to be in the 140's.Pulse went back into the 60's to 80's without intervention.  Recurrent chest pain 7/5 he was woken up around 2 AM with left shoulder pain that radiated to his left chest.  He presented to [facility] for evaluation. Troponin was found to be mildly elevated and flated trending 0.08-->0.05-->0.06-->0.07.  He underwent cardiac cath 48 hours which showed right dominant system, no significant CAD, normal LV gram with EF >55%  the patient reports hand sensitivity to cold which started about 2 years ago and has been worsening. His fingers turn blue and her with significant pain when he is exposed to cold weather. He  usually does better in the summertime. He developed an ulceration on the tip of the left small finger about 3 months ago which has gradually worsened with early gangrenous changes. No trauma.  he underwent upper extremity duplex which showed atypical flow in the ulnar artery and normal flow  through the radial artery.  PMH:   has a past medical history of Anemia (10/2015); Atrial flutter ([place]); Gangrene of finger ([place]); History of nuclear stress test; HTN (hypertension); Normal coronary arteries; Pulmonary fibrosis (HCC); RA (rheumatoid arthritis) ([place]); and Tachycardia-induced cardiomyopathy ([place]).  PSH:    Past Surgical History:  Procedure Laterality Date  . CARDIAC CATHETERIZATION N/A 12/03/2014   Procedure: Left Heart Cath and Coronary Angiography;  Surgeon: Minna Merritts, MD;  Location: [place] [facility];  Service: Cardiovascular;  Laterality: N/A;  . COLONOSCOPY WITH PROPOFOL N/A 11/23/2015   Procedure: COLONOSCOPY WITH PROPOFOL;  Surgeon: [name], MD;  Location: [facility] ENDOSCOPY;  Service: Endoscopy;  Laterality: N/A;  . ESOPHAGOGASTRODUODENOSCOPY (EGD) WITH PROPOFOL N/A 11/23/2015   Procedure: ESOPHAGOGASTRODUODENOSCOPY (EGD) WITH PROPOFOL;  Surgeon: [name], MD;  Location: [facility] ENDOSCOPY;  Service: Endoscopy;  Laterality: N/A;  . HAND SURGERY Left   . PERIPHERAL VASCULAR CATHETERIZATION N/A 11/17/2015   Procedure: Upper Extremity Angiography;  Surgeon: Wellington Hampshire, MD;  Location: [place] CV LAB;  Service: Cardiovascular;  Laterality: N/A;    Current Outpatient Prescriptions  Medication Sig Dispense Refill  . albuterol (PROVENTIL HFA;VENTOLIN HFA) 108 (90 Base) MCG/ACT inhaler Inhale 2 puffs into the lungs 4 (four) times daily as needed for wheezing or shortness of breath. 1 Inhaler 11  . amLODipine (NORVASC) 5 MG tablet TAKE 1 TABLET (5 MG TOTAL) BY MOUTH ONCE DAILY.  11  . dexlansoprazole (DEXILANT) 60 MG capsule Take 1 capsule (60 mg total) by mouth daily. 30 capsule 11  . diltiazem (CARDIZEM) 30 MG tablet TAKE 1 TABLET (30 MG TOTAL) BY MOUTH 3 (THREE) TIMES DAILY. 90 tablet 3  . enalapril (VASOTEC) 20 MG tablet Take 20 mg by mouth daily. Dr Delmer Islam    . fluticasone (FLONASE) 50 MCG/ACT nasal spray Place 2 sprays into both nostrils daily. 16 g 0  .  Fluticasone-Salmeterol (ADVAIR DISKUS) 250-50 MCG/DOSE AEPB INHALE 1 DOSE BY MOUTH TWICE DAILY. RINSE MOUTH AFTER USE 60 each 11  . hydrochlorothiazide (HYDRODIURIL) 25 MG tablet TAKE 1 TABLET BY MOUTH EVERY DAY. NEED APPT FOR REFILLS 90 tablet 2  . hydroxychloroquine (PLAQUENIL) 200 MG tablet Take 200 mg by mouth daily. Dr Delmer Islam    . meloxicam (MOBIC) 15 MG tablet Take 1 tablet (15 mg total) by mouth daily. 30 tablet 0  . metoprolol (LOPRESSOR) 50 MG tablet Take 75 mg by mouth 2 (two) times daily. Dr Delmer Islam    . mycophenolate (CELLCEPT) 500 MG tablet Take 500 mg by mouth 2 (two) times daily.  11  . NITRO-BID 2 % ointment Apply 0.5 inches topically daily. Dr Precious Reel    . pantoprazole (PROTONIX) 40 MG tablet Take 1 tablet (40 mg total) by mouth daily. 30 tablet 5  . predniSONE (DELTASONE) 20 MG tablet TAKE 1 TABLET (20 MG TOTAL) BY MOUTH DAILY WITH BREAKFAST. 30 tablet 0  . XARELTO 20 MG TABS tablet TAKE 1 TABLET BY MOUTH EVERY MORNING 30 tablet 3   No current facility-administered medications for this visit.      Allergies:   Patient has no known allergies.   Social History:  The patient  reports that he quit smoking about 12  years ago. His smoking use included Cigarettes. He has a 20.00 pack-year smoking history. He has never used smokeless tobacco. He reports that he does not drink alcohol or use drugs.   Family History:   family history includes Leukemia in his mother.    Review of Systems: Review of Systems  Constitutional: Negative.   Respiratory: Positive for shortness of breath.   Cardiovascular: Positive for leg swelling.  Gastrointestinal: Negative.   Musculoskeletal: Negative.   Neurological: Negative.   Psychiatric/Behavioral: Negative.   All other systems reviewed and are negative.    PHYSICAL EXAM: VS:  BP 112/74 (BP Location: Left Arm, Patient Position: Sitting, Cuff Size: Normal)   Pulse (!) 59   Ht 5\' 10"  (1.778 m)   Wt 231 lb 8 oz (105 kg)   BMI  33.22 kg/m  , BMI Body mass index is 33.22 kg/m. GEN: Well nourished, well developed, in no acute distress , on oxygen HEENT: normal  Neck: no JVD, carotid bruits, or masses Cardiac: RRR; no murmurs, rubs, or gallops,trace LE edema  Respiratory:  clear to auscultation bilaterally, normal work of breathing GI: soft, nontender, nondistended, + BS MS: no deformity or atrophy  Skin: warm and dry, no rash Neuro:  Strength and sensation are intact Psych: euthymic mood, full affect    Recent Labs: 11/29/2015: Magnesium 2.0 07/25/2016: ALT 39; BUN 21; Creatinine, Ser 1.02; Hemoglobin 14.5; Platelets 229; Potassium 3.6; Sodium 138    Lipid Panel Lab Results  Component Value Date   CHOL 88 08/25/2014   HDL 24 (L) 08/25/2014   LDLCALC 39 08/25/2014   TRIG 124 08/25/2014      Wt Readings from Last 3 Encounters:  08/29/16 231 lb 8 oz (105 kg)  08/08/16 228 lb (103.4 kg)  07/25/16 225 lb (102.1 kg)       ASSESSMENT AND PLAN:  Essential hypertension - Plan: EKG 12-Lead We have recommended he stop amlodipine We'll change diltiazem to the extended release 120 mg daily  Frequent PVCs - Plan: EKG 12-Lead Currently not a major issue, we'll continue beta blockers  Shortness of breath - Plan: EKG 12-Lead Suspect secondary to underlying lung disease, followed by pulmonary  Typical atrial flutter (HCC) Stop amiodarone Secondary to underlying lung disease Continue anticoagulation Change diltiazem as above  Pulmonary fibrosis (HCC) Currently on oxygen by nasal cannula Followed by Dr. Raul Del  Encounter for anticoagulation discussion and counseling Tolerating anticoagulation, no bruising or bleeding Prior history of anemia  Disposition:   F/U  6 months   Total encounter time more than 25 minutes  Greater than 50% was spent in counseling and coordination of care with the patient    Orders Placed This Encounter  Procedures  . EKG 12-Lead     Signed, Esmond Plants, M.D.,  Ph.D. 08/29/2016  [place], [place]

## 2016-08-29 ENCOUNTER — Encounter: Payer: Self-pay | Admitting: Cardiovascular Disease

## 2016-08-29 ENCOUNTER — Ambulatory Visit (INDEPENDENT_AMBULATORY_CARE_PROVIDER_SITE_OTHER): Payer: BLUE CROSS/BLUE SHIELD | Admitting: Cardiovascular Disease

## 2016-08-29 VITALS — BP 112/74 | HR 59 | Ht 70.0 in | Wt 231.5 lb

## 2016-08-29 DIAGNOSIS — I483 Typical atrial flutter: Secondary | ICD-10-CM

## 2016-08-29 DIAGNOSIS — J8410 Pulmonary fibrosis, unspecified: Secondary | ICD-10-CM

## 2016-08-29 DIAGNOSIS — I493 Ventricular premature depolarization: Secondary | ICD-10-CM | POA: Diagnosis not present

## 2016-08-29 DIAGNOSIS — I10 Essential (primary) hypertension: Secondary | ICD-10-CM

## 2016-08-29 DIAGNOSIS — R0602 Shortness of breath: Secondary | ICD-10-CM

## 2016-08-29 DIAGNOSIS — Z7189 Other specified counseling: Secondary | ICD-10-CM | POA: Diagnosis not present

## 2016-08-29 MED ORDER — DILTIAZEM HCL ER 120 MG PO CP12: 120.0000 mg | ORAL_CAPSULE | Freq: Every day | ORAL | 6 refills | Status: DC

## 2016-08-29 NOTE — Patient Instructions (Addendum)
Medication Instructions:   Stop the amlodipine Start Cardizem 120 mg once daily  Stop the amiodarone  Labwork:  No new labs needed  Testing/Procedures:  No further testing at this time   I recommend watching educational videos on topics of interest to you at:       [email or web address]  Enter code: [email or web address]    Follow-Up: It was a pleasure seeing you in the office today. Please call us if you have new issues that need to be addressed before your next appt.  [phone]  Your physician wants you to follow-up in: 6 months.  You will receive a reminder letter in the mail two months in advance. If you don't receive a letter, please call our office to schedule the follow-up appointment.  If you need a refill on your cardiac medications before your next appointment, please call your pharmacy.

## 2016-09-08 ENCOUNTER — Telehealth: Payer: Self-pay

## 2016-09-08 ENCOUNTER — Telehealth: Payer: Self-pay | Admitting: Cardiovascular Disease

## 2016-09-08 ENCOUNTER — Other Ambulatory Visit: Payer: Self-pay | Admitting: Cardiovascular Disease

## 2016-09-08 NOTE — Telephone Encounter (Signed)
[employer] called to advise they have started case management with patient. Meaning they can go do in house visit and review screening and needed vaccines and upcoming appointments.

## 2016-09-08 NOTE — Telephone Encounter (Signed)
Contacted patient and he is aware of a free 30 day supply card and a $10 co-pay card are up front and ready to be picked up.

## 2016-09-08 NOTE — Telephone Encounter (Signed)
Pt calling asking if he may get a free month card for medication Xarelto He has not met his deductible and would like to know if we can please call him if we have some

## 2016-09-11 ENCOUNTER — Other Ambulatory Visit: Payer: Self-pay

## 2016-09-11 MED ORDER — DILTIAZEM HCL 30 MG PO TABS: 30.0000 mg | ORAL_TABLET | Freq: Three times a day (TID) | ORAL | 3 refills | Status: DC

## 2016-09-11 MED ORDER — WARFARIN SODIUM 5 MG PO TABS: ORAL_TABLET | ORAL | 0 refills | Status: DC

## 2016-09-11 NOTE — Telephone Encounter (Signed)
Pt states he took last tablet of Xarelto today Pt instructed that we would have liked for him to overlap Xarelto and Coumadin for 3 days then stop Xarelto and continue coumadin but since he does not have anymore Xarelto left to get Coumadin today and start taking coumadin 5mg  daily around 6pm and appt made for him to be checked at [facility] office on Friday April 20th pt instructed to monitor for any bleeding and he states understanding

## 2016-09-11 NOTE — Telephone Encounter (Signed)
Pt calling stating he would like to start the coumadin He is asking about the discount cards.  Would like more information on this, he states also if he can get some samples of this just to last him until picks it up  Please advise.

## 2016-09-11 NOTE — Telephone Encounter (Signed)
Spoke w/ pt.  Advised him to speak w/ his pharmacy and see if Eliquis is more affordable. Advised him that coumadin would be an option if these are too expensive.  He states that he would not mind the frequent visits for INR checks if it is cheaper. He will call back after speaking w/ his pharmacy.

## 2016-09-11 NOTE — Telephone Encounter (Signed)
Pt calling back today, asking if he can get some samples of Xarelto 20 mg  He is down to his last pill today  He took the card to the pharmacy and they told him it was only to be used once. He used it once already before, when he first started this medication . He has not met his deductible and it will be about $430 for 30 day  He states if we can give him about 30 day supply of this medication.  Please advise

## 2016-09-11 NOTE — Telephone Encounter (Signed)
He's on Xarelto 20 mg.

## 2016-09-11 NOTE — Telephone Encounter (Signed)
Xarelto and Eliquis are quite expensive for pt and he would like to start coumadin. Are you agreeable to this?   If so, would you like to start him on 5 mg daily until we set him up w/ [facility]?

## 2016-09-11 NOTE — Telephone Encounter (Signed)
Yes we can switch him to warfarin  Started on 5 mg daily Would find out which pill he is taking currently eliquis vs xarelto as we need to transition him over carefully

## 2016-09-11 NOTE — Telephone Encounter (Signed)
Please advise patient can't afford the Xarelto since it will cost him $430.00 a month for a 30 day supply.

## 2016-09-15 ENCOUNTER — Ambulatory Visit (INDEPENDENT_AMBULATORY_CARE_PROVIDER_SITE_OTHER): Payer: BLUE CROSS/BLUE SHIELD | Admitting: *Deleted

## 2016-09-15 DIAGNOSIS — Z5181 Encounter for therapeutic drug level monitoring: Secondary | ICD-10-CM | POA: Insufficient documentation

## 2016-09-15 DIAGNOSIS — I483 Typical atrial flutter: Secondary | ICD-10-CM

## 2016-09-15 LAB — POCT INR: INR: 1.5

## 2016-09-15 NOTE — Patient Instructions (Signed)

## 2016-09-19 ENCOUNTER — Other Ambulatory Visit: Payer: Self-pay | Admitting: *Deleted

## 2016-09-19 MED ORDER — ENALAPRIL MALEATE 20 MG PO TABS: 20.0000 mg | ORAL_TABLET | Freq: Every day | ORAL | 3 refills | Status: DC

## 2016-09-20 ENCOUNTER — Ambulatory Visit (INDEPENDENT_AMBULATORY_CARE_PROVIDER_SITE_OTHER): Payer: BLUE CROSS/BLUE SHIELD

## 2016-09-20 DIAGNOSIS — I483 Typical atrial flutter: Secondary | ICD-10-CM | POA: Diagnosis not present

## 2016-09-20 DIAGNOSIS — Z5181 Encounter for therapeutic drug level monitoring: Secondary | ICD-10-CM | POA: Diagnosis not present

## 2016-09-20 LAB — POCT INR: INR: 1.2

## 2016-09-27 ENCOUNTER — Ambulatory Visit (INDEPENDENT_AMBULATORY_CARE_PROVIDER_SITE_OTHER): Payer: BLUE CROSS/BLUE SHIELD | Admitting: *Deleted

## 2016-09-27 DIAGNOSIS — I483 Typical atrial flutter: Secondary | ICD-10-CM | POA: Diagnosis not present

## 2016-09-27 DIAGNOSIS — Z5181 Encounter for therapeutic drug level monitoring: Secondary | ICD-10-CM

## 2016-09-27 LAB — POCT INR: INR: 2.2

## 2016-10-04 ENCOUNTER — Ambulatory Visit (INDEPENDENT_AMBULATORY_CARE_PROVIDER_SITE_OTHER): Payer: BLUE CROSS/BLUE SHIELD

## 2016-10-04 DIAGNOSIS — I483 Typical atrial flutter: Secondary | ICD-10-CM | POA: Diagnosis not present

## 2016-10-04 DIAGNOSIS — Z5181 Encounter for therapeutic drug level monitoring: Secondary | ICD-10-CM

## 2016-10-04 LAB — POCT INR: INR: 1.8

## 2016-10-10 ENCOUNTER — Inpatient Hospital Stay: Payer: BLUE CROSS/BLUE SHIELD | Attending: Internal Medicine | Admitting: Internal Medicine

## 2016-10-10 ENCOUNTER — Inpatient Hospital Stay: Payer: BLUE CROSS/BLUE SHIELD

## 2016-10-10 VITALS — BP 123/78 | HR 61 | Temp 98.9°F | Resp 16 | Wt 235.1 lb

## 2016-10-10 DIAGNOSIS — M069 Rheumatoid arthritis, unspecified: Secondary | ICD-10-CM | POA: Diagnosis not present

## 2016-10-10 DIAGNOSIS — I4891 Unspecified atrial fibrillation: Secondary | ICD-10-CM | POA: Insufficient documentation

## 2016-10-10 DIAGNOSIS — I4892 Unspecified atrial flutter: Secondary | ICD-10-CM | POA: Insufficient documentation

## 2016-10-10 DIAGNOSIS — D591 Other autoimmune hemolytic anemias: Secondary | ICD-10-CM | POA: Diagnosis not present

## 2016-10-10 DIAGNOSIS — Z79899 Other long term (current) drug therapy: Secondary | ICD-10-CM | POA: Diagnosis not present

## 2016-10-10 DIAGNOSIS — I10 Essential (primary) hypertension: Secondary | ICD-10-CM | POA: Diagnosis not present

## 2016-10-10 DIAGNOSIS — E876 Hypokalemia: Secondary | ICD-10-CM | POA: Diagnosis not present

## 2016-10-10 DIAGNOSIS — Z87891 Personal history of nicotine dependence: Secondary | ICD-10-CM | POA: Diagnosis not present

## 2016-10-10 DIAGNOSIS — I429 Cardiomyopathy, unspecified: Secondary | ICD-10-CM | POA: Insufficient documentation

## 2016-10-10 DIAGNOSIS — Z7901 Long term (current) use of anticoagulants: Secondary | ICD-10-CM | POA: Insufficient documentation

## 2016-10-10 DIAGNOSIS — D594 Other nonautoimmune hemolytic anemias: Secondary | ICD-10-CM

## 2016-10-10 LAB — CBC WITH DIFFERENTIAL/PLATELET
Basophils Absolute: 0.1 10*3/uL (ref 0–0.1)
Basophils Relative: 1 %
Eosinophils Absolute: 0.3 10*3/uL (ref 0–0.7)
Eosinophils Relative: 2 %
HCT: 43.2 % (ref 40.0–52.0)
Hemoglobin: 14 g/dL (ref 13.0–18.0)
Lymphocytes Relative: 24 %
Lymphs Abs: 3.2 10*3/uL (ref 1.0–3.6)
MCH: 26.7 pg (ref 26.0–34.0)
MCHC: 32.3 g/dL (ref 32.0–36.0)
MCV: 82.5 fL (ref 80.0–100.0)
Monocytes Absolute: 1.1 10*3/uL — ABNORMAL HIGH (ref 0.2–1.0)
Monocytes Relative: 9 %
Neutro Abs: 8.4 10*3/uL — ABNORMAL HIGH (ref 1.4–6.5)
Neutrophils Relative %: 64 %
Platelets: 220 10*3/uL (ref 150–440)
RBC: 5.24 MIL/uL (ref 4.40–5.90)
RDW: 16.1 % — ABNORMAL HIGH (ref 11.5–14.5)
WBC: 13.1 10*3/uL — ABNORMAL HIGH (ref 3.8–10.6)

## 2016-10-10 LAB — BASIC METABOLIC PANEL WITH GFR
Anion gap: 7 (ref 5–15)
BUN: 26 mg/dL — ABNORMAL HIGH (ref 6–20)
CO2: 30 mmol/L (ref 22–32)
Calcium: 9.4 mg/dL (ref 8.9–10.3)
Chloride: 100 mmol/L — ABNORMAL LOW (ref 101–111)
Creatinine, Ser: 1.15 mg/dL (ref 0.61–1.24)
GFR calc Af Amer: >60 mL/min
GFR calc non Af Amer: >60 mL/min
Glucose, Bld: 89 mg/dL (ref 65–99)
Potassium: 3.1 mmol/L — ABNORMAL LOW (ref 3.5–5.1)
Sodium: 137 mmol/L (ref 135–145)

## 2016-10-10 LAB — LACTATE DEHYDROGENASE: LDH: 229 U/L — ABNORMAL HIGH (ref 98–192)

## 2016-10-10 NOTE — Progress Notes (Signed)
Patient here today for follow up.  Patient states no new concerns today  

## 2016-10-10 NOTE — Assessment & Plan Note (Addendum)
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   # History of A. Fib- on Xarelto.  # Hypokalemia- 3.1- on diuretic- recommend nutrition supp.   #follow up in 4 months/labs.

## 2016-10-10 NOTE — Progress Notes (Signed)
[place] OFFICE PROGRESS NOTE  Patient Care Team: Juline Patch, MD as PCP - General (Family Medicine) Minna Merritts, MD as Consulting Physician (Cardiology)    No history exists.   # July 2017 AUTOIMMUNE HEMOLYTIC ANEMIA [Hb-4-5]; ; s/p Solumedrol 1gm/day x3; Prednisone 90mg /day; on taper.; Prednisone 15mg / cellcept 500 BID[Dr.kernodle]  # CONNECTIVE TISSUE/AUTOIMMUNE DISORDER [Dr.Kenodle] ; A.fib on xarelto; EGD/[place]-NEG [July 2017]  # s/p left hand peri-arterial sympathetetcomy [ [facility]; [place]]   INTERVAL HISTORY:  [name] 56 y.o.  male [place] patient above history of Autoimmune hemolytic anemia- currently on prednisone 20 mg/CellCept 500 twice a day [also for Autoimmune/connective tissue disorder]- is here for follow-up.  Patient denies any worsening shortness of breath or chest pain. Denies any difficulty swallowing. No nausea no vomiting. Patient  feels back to his baseline.  No difficulty swallowing pain with swallowing. Denies any bleeding.  REVIEW OF SYSTEMS:  A complete 10 point review of system is done which is negative except mentioned above/history of present illness.   PAST MEDICAL HISTORY :  Past Medical History:  Diagnosis Date  . Anemia 10/2015  . Atrial flutter ([place])    a. on Xarelto; b. s/p successful TEE/DCCV on 08/28/2014  . Gangrene of finger ([place])   . History of nuclear stress test    a. 09/02/2014: no sig ischemia, GI uptake noted, no sig WMA, EF 48%, no EKG chanes concerning for ischemia, low risk scan    . HTN (hypertension)   . Normal coronary arteries    a. cardiac cath 12/03/2014: no sigificant CAD, right dominant system, LVEF >55%, no MR or AS  . Pulmonary fibrosis ([place])    a. not on home oxygen  . RA (rheumatoid arthritis) ([place])   . Tachycardia-induced cardiomyopathy ([place])    a. echo 07/2014: EF 20-25%, cannot exclude atrial flutter,  global HK, possible bicuspid Ao valve, mod reduced RV sys fxn,  mild-mod aortic valve sclerosis/calcification, mod TR, mildly elevated RVSP; b. TEE 08/28/2014: EF 30-35%, no intracardic thrombus, mildly dilated LA/RA, mild TR, mild-mod aortic sclerosis w/o stenosis; c. echo 09/2014: EF 55-60%, select images w/ inf HK    PAST SURGICAL HISTORY :   Past Surgical History:  Procedure Laterality Date  . CARDIAC CATHETERIZATION N/A 12/03/2014   Procedure: Left Heart Cath and Coronary Angiography;  Surgeon: Minna Merritts, MD;  Location: [place] [facility] LAB;  Service: Cardiovascular;  Laterality: N/A;  . COLONOSCOPY WITH PROPOFOL N/A 11/23/2015   Procedure: COLONOSCOPY WITH PROPOFOL;  Surgeon: [name], MD;  Location: [facility] ENDOSCOPY;  Service: Endoscopy;  Laterality: N/A;  . ESOPHAGOGASTRODUODENOSCOPY (EGD) WITH PROPOFOL N/A 11/23/2015   Procedure: ESOPHAGOGASTRODUODENOSCOPY (EGD) WITH PROPOFOL;  Surgeon: [name], MD;  Location: [facility] ENDOSCOPY;  Service: Endoscopy;  Laterality: N/A;  . HAND SURGERY Left   . PERIPHERAL VASCULAR CATHETERIZATION N/A 11/17/2015   Procedure: Upper Extremity Angiography;  Surgeon: Wellington Hampshire, MD;  Location: [place] CV LAB;  Service: Cardiovascular;  Laterality: N/A;    FAMILY HISTORY :   Family History  Problem Relation Age of Onset  . Leukemia Mother     SOCIAL HISTORY:   Social History  Substance Use Topics  . Smoking status: Former Smoker    Packs/day: 1.00    Years: 20.00    Types: Cigarettes    Quit date: 08/30/2004  . Smokeless tobacco: Never Used  . Alcohol use No    ALLERGIES:  has No Known Allergies.  MEDICATIONS:  Current Outpatient Prescriptions  Medication Sig Dispense Refill  . albuterol (PROVENTIL HFA;VENTOLIN HFA) 108 (90 Base) MCG/ACT inhaler Inhale 2 puffs into the lungs 4 (four) times daily as needed for wheezing or shortness of breath. 1 Inhaler 11  . dexlansoprazole (DEXILANT) 60 MG capsule Take 1 capsule (60 mg total) by mouth daily. 30 capsule 11  . diltiazem (CARDIZEM) 30 MG tablet  Take 1 tablet (30 mg total) by mouth 3 (three) times daily. 90 tablet 3  . enalapril (VASOTEC) 20 MG tablet Take 1 tablet (20 mg total) by mouth daily. 90 tablet 3  . fluticasone (FLONASE) 50 MCG/ACT nasal spray Place 2 sprays into both nostrils daily. 16 g 0  . Fluticasone-Salmeterol (ADVAIR DISKUS) 250-50 MCG/DOSE AEPB INHALE 1 DOSE BY MOUTH TWICE DAILY. RINSE MOUTH AFTER USE 60 each 11  . hydrochlorothiazide (HYDRODIURIL) 25 MG tablet TAKE 1 TABLET BY MOUTH EVERY DAY. NEED APPT FOR REFILLS 90 tablet 2  . hydroxychloroquine (PLAQUENIL) 200 MG tablet Take 200 mg by mouth daily. Dr Delmer Islam    . meloxicam (MOBIC) 15 MG tablet Take 1 tablet (15 mg total) by mouth daily. 30 tablet 0  . metoprolol (LOPRESSOR) 50 MG tablet Take 75 mg by mouth 2 (two) times daily. Dr Delmer Islam    . mycophenolate (CELLCEPT) 500 MG tablet Take 500 mg by mouth 2 (two) times daily.  11  . pantoprazole (PROTONIX) 40 MG tablet Take 1 tablet (40 mg total) by mouth daily. 30 tablet 5  . predniSONE (DELTASONE) 20 MG tablet TAKE 1 TABLET (20 MG TOTAL) BY MOUTH DAILY WITH BREAKFAST. 30 tablet 0  . warfarin (COUMADIN) 5 MG tablet Take as directed by coumadin clinic 30 tablet 0   No current facility-administered medications for this visit.     PHYSICAL EXAMINATION:   BP 123/78 (BP Location: Left Arm, Patient Position: Sitting)   Pulse 61   Temp 98.9 F (37.2 C)   Resp 16   Wt 235 lb 1.9 oz (106.7 kg)   BMI 33.74 kg/m   Filed Weights   10/10/16 1028  Weight: 235 lb 1.9 oz (106.7 kg)    GENERAL: Well-nourished well-developed; Alert, no distress and comfortable.  Alone.  EYES: no pallor or icterus OROPHARYNX: no thrush or ulceration; good dentition  NECK: supple, no masses felt LYMPH:  no palpable lymphadenopathy in the cervical, axillary or inguinal regions LUNGS: clear to auscultation and  No wheeze or crackles HEART/CVS: regular rate & rhythm and no murmurs; No lower extremity edema ABDOMEN:abdomen soft,  non-tender and normal bowel sounds Musculoskeletal:no cyanosis of digits and no clubbing  PSYCH: alert & oriented x 3 with fluent speech NEURO: no focal motor/sensory deficits SKIN:  no rashes or significant lesions  LABORATORY DATA:  I have reviewed the data as listed    Component Value Date/Time   NA 137 10/10/2016 0958   NA 138 12/11/2014 1518   NA 137 08/27/2014 0403   K 3.1 (L) 10/10/2016 0958   K 4.1 08/27/2014 0403   CL 100 (L) 10/10/2016 0958   CL 108 08/27/2014 0403   CO2 30 10/10/2016 0958   CO2 26 08/27/2014 0403   GLUCOSE 89 10/10/2016 0958   GLUCOSE 94 08/27/2014 0403   BUN 26 (H) 10/10/2016 0958   BUN 24 12/11/2014 1518   BUN 16 08/27/2014 0403   CREATININE 1.15 10/10/2016 0958   CREATININE 0.85 08/27/2014 0403   CALCIUM 9.4 10/10/2016 0958   CALCIUM 8.6 (L) 08/27/2014 0403   PROT 7.3 07/25/2016 0943   ALBUMIN  3.8 07/25/2016 0943   AST 25 07/25/2016 0943   ALT 39 07/25/2016 0943   ALKPHOS 49 07/25/2016 0943   BILITOT 0.4 07/25/2016 0943   GFRNONAA >60 10/10/2016 0958   GFRNONAA >60 08/27/2014 0403   GFRAA >60 10/10/2016 0958   GFRAA >60 08/27/2014 0403    No results found for: SPEP, UPEP  Lab Results  Component Value Date   WBC 13.1 (H) 10/10/2016   NEUTROABS 8.4 (H) 10/10/2016   HGB 14.0 10/10/2016   HCT 43.2 10/10/2016   MCV 82.5 10/10/2016   PLT 220 10/10/2016      Chemistry      Component Value Date/Time   NA 137 10/10/2016 0958   NA 138 12/11/2014 1518   NA 137 08/27/2014 0403   K 3.1 (L) 10/10/2016 0958   K 4.1 08/27/2014 0403   CL 100 (L) 10/10/2016 0958   CL 108 08/27/2014 0403   CO2 30 10/10/2016 0958   CO2 26 08/27/2014 0403   BUN 26 (H) 10/10/2016 0958   BUN 24 12/11/2014 1518   BUN 16 08/27/2014 0403   CREATININE 1.15 10/10/2016 0958   CREATININE 0.85 08/27/2014 0403      Component Value Date/Time   CALCIUM 9.4 10/10/2016 0958   CALCIUM 8.6 (L) 08/27/2014 0403   ALKPHOS 49 07/25/2016 0943   AST 25 07/25/2016 0943    ALT 39 07/25/2016 0943   BILITOT 0.4 07/25/2016 0943       RADIOGRAPHIC STUDIES: I have personally reviewed the radiological images as listed and agreed with the findings in the report. No results found.   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   # History of A. Fib- on Xarelto.  # Hypokalemia- 3.1- on diuretic- recommend nutrition supp.   #follow up in 4 months/labs.    Orders Placed This Encounter  Procedures  . Reticulocytes ([place])    Standing Status:   Future    Standing Expiration Date:   10/10/2017  . CBC with Differential/Platelet    Standing Status:   Future    Standing Expiration Date:   10/10/2017  . Comprehensive metabolic panel    Standing Status:   Future    Standing Expiration Date:   10/10/2017  . Lactate dehydrogenase    Standing Status:   Future    Standing Expiration Date:   10/10/2017   All questions were answered. The patient knows to call the clinic with any problems, questions or concerns.      Cammie Sickle, MD 10/10/2016 2:13 PM

## 2016-10-11 ENCOUNTER — Ambulatory Visit (INDEPENDENT_AMBULATORY_CARE_PROVIDER_SITE_OTHER): Payer: BLUE CROSS/BLUE SHIELD | Admitting: *Deleted

## 2016-10-11 DIAGNOSIS — I4892 Unspecified atrial flutter: Secondary | ICD-10-CM | POA: Diagnosis not present

## 2016-10-11 DIAGNOSIS — I483 Typical atrial flutter: Secondary | ICD-10-CM | POA: Diagnosis not present

## 2016-10-11 DIAGNOSIS — Z5181 Encounter for therapeutic drug level monitoring: Secondary | ICD-10-CM | POA: Diagnosis not present

## 2016-10-11 LAB — POCT INR: INR: 3.9

## 2016-10-11 MED ORDER — WARFARIN SODIUM 5 MG PO TABS: ORAL_TABLET | ORAL | 1 refills | Status: DC

## 2016-10-12 ENCOUNTER — Other Ambulatory Visit: Payer: Self-pay | Admitting: Specialist

## 2016-10-12 DIAGNOSIS — R590 Localized enlarged lymph nodes: Secondary | ICD-10-CM

## 2016-10-17 ENCOUNTER — Other Ambulatory Visit: Payer: 59

## 2016-10-17 ENCOUNTER — Ambulatory Visit: Payer: 59 | Admitting: Internal Medicine

## 2016-10-18 ENCOUNTER — Ambulatory Visit (INDEPENDENT_AMBULATORY_CARE_PROVIDER_SITE_OTHER): Payer: BLUE CROSS/BLUE SHIELD

## 2016-10-18 DIAGNOSIS — I483 Typical atrial flutter: Secondary | ICD-10-CM

## 2016-10-18 DIAGNOSIS — Z5181 Encounter for therapeutic drug level monitoring: Secondary | ICD-10-CM | POA: Diagnosis not present

## 2016-10-18 DIAGNOSIS — I4892 Unspecified atrial flutter: Secondary | ICD-10-CM

## 2016-10-18 LAB — POCT INR: INR: 4

## 2016-10-25 ENCOUNTER — Ambulatory Visit (INDEPENDENT_AMBULATORY_CARE_PROVIDER_SITE_OTHER): Payer: BLUE CROSS/BLUE SHIELD

## 2016-10-25 DIAGNOSIS — Z5181 Encounter for therapeutic drug level monitoring: Secondary | ICD-10-CM | POA: Diagnosis not present

## 2016-10-25 DIAGNOSIS — I483 Typical atrial flutter: Secondary | ICD-10-CM | POA: Diagnosis not present

## 2016-10-25 LAB — POCT INR: INR: 3.2

## 2016-11-06 ENCOUNTER — Ambulatory Visit: Payer: 59 | Admitting: Cardiovascular Disease

## 2016-11-08 ENCOUNTER — Ambulatory Visit (INDEPENDENT_AMBULATORY_CARE_PROVIDER_SITE_OTHER): Payer: BLUE CROSS/BLUE SHIELD

## 2016-11-08 DIAGNOSIS — I483 Typical atrial flutter: Secondary | ICD-10-CM

## 2016-11-08 DIAGNOSIS — I4892 Unspecified atrial flutter: Secondary | ICD-10-CM

## 2016-11-08 DIAGNOSIS — Z5181 Encounter for therapeutic drug level monitoring: Secondary | ICD-10-CM

## 2016-11-08 LAB — POCT INR: INR: 5.6

## 2016-11-15 ENCOUNTER — Ambulatory Visit (INDEPENDENT_AMBULATORY_CARE_PROVIDER_SITE_OTHER): Payer: BLUE CROSS/BLUE SHIELD | Admitting: *Deleted

## 2016-11-15 DIAGNOSIS — I483 Typical atrial flutter: Secondary | ICD-10-CM | POA: Diagnosis not present

## 2016-11-15 DIAGNOSIS — Z5181 Encounter for therapeutic drug level monitoring: Secondary | ICD-10-CM | POA: Diagnosis not present

## 2016-11-15 DIAGNOSIS — I4892 Unspecified atrial flutter: Secondary | ICD-10-CM

## 2016-11-15 LAB — POCT INR: INR: 2.7

## 2016-11-22 ENCOUNTER — Ambulatory Visit (INDEPENDENT_AMBULATORY_CARE_PROVIDER_SITE_OTHER): Payer: BLUE CROSS/BLUE SHIELD

## 2016-11-22 DIAGNOSIS — I483 Typical atrial flutter: Secondary | ICD-10-CM

## 2016-11-22 DIAGNOSIS — I4892 Unspecified atrial flutter: Secondary | ICD-10-CM | POA: Diagnosis not present

## 2016-11-22 DIAGNOSIS — Z5181 Encounter for therapeutic drug level monitoring: Secondary | ICD-10-CM

## 2016-11-22 LAB — POCT INR: INR: 2.8

## 2016-12-04 ENCOUNTER — Ambulatory Visit
Admission: RE | Admit: 2016-12-04 | Discharge: 2016-12-04 | Disposition: A | Discharge status: Home / Self Care | Payer: BLUE CROSS/BLUE SHIELD | Source: Ambulatory Visit | Attending: Specialist | Admitting: Specialist

## 2016-12-04 DIAGNOSIS — I700 Atherosclerosis of aorta: Secondary | ICD-10-CM | POA: Diagnosis not present

## 2016-12-04 DIAGNOSIS — J849 Interstitial pulmonary disease, unspecified: Secondary | ICD-10-CM | POA: Diagnosis not present

## 2016-12-04 DIAGNOSIS — M4856XA Collapsed vertebra, not elsewhere classified, lumbar region, initial encounter for fracture: Secondary | ICD-10-CM | POA: Diagnosis not present

## 2016-12-04 DIAGNOSIS — R590 Localized enlarged lymph nodes: Secondary | ICD-10-CM

## 2016-12-06 ENCOUNTER — Ambulatory Visit (INDEPENDENT_AMBULATORY_CARE_PROVIDER_SITE_OTHER): Payer: BLUE CROSS/BLUE SHIELD | Admitting: *Deleted

## 2016-12-06 DIAGNOSIS — I483 Typical atrial flutter: Secondary | ICD-10-CM | POA: Diagnosis not present

## 2016-12-06 DIAGNOSIS — I4892 Unspecified atrial flutter: Secondary | ICD-10-CM | POA: Diagnosis not present

## 2016-12-06 DIAGNOSIS — Z5181 Encounter for therapeutic drug level monitoring: Secondary | ICD-10-CM

## 2016-12-06 LAB — POCT INR: INR: 3.3

## 2016-12-08 ENCOUNTER — Other Ambulatory Visit: Payer: Self-pay | Admitting: Cardiovascular Disease

## 2016-12-08 NOTE — Telephone Encounter (Signed)
Please review for refill, Thanks !  

## 2016-12-08 NOTE — Telephone Encounter (Signed)
Please review for refill, thanks ! 

## 2016-12-08 NOTE — Telephone Encounter (Signed)
I think these go to coumadin clinic

## 2016-12-20 ENCOUNTER — Ambulatory Visit (INDEPENDENT_AMBULATORY_CARE_PROVIDER_SITE_OTHER): Payer: BLUE CROSS/BLUE SHIELD

## 2016-12-20 DIAGNOSIS — I483 Typical atrial flutter: Secondary | ICD-10-CM

## 2016-12-20 DIAGNOSIS — I4892 Unspecified atrial flutter: Secondary | ICD-10-CM

## 2016-12-20 DIAGNOSIS — Z5181 Encounter for therapeutic drug level monitoring: Secondary | ICD-10-CM | POA: Diagnosis not present

## 2016-12-20 LAB — POCT INR: INR: 2.8

## 2017-01-07 ENCOUNTER — Other Ambulatory Visit: Payer: Self-pay | Admitting: Gastroenterology

## 2017-01-09 ENCOUNTER — Other Ambulatory Visit: Payer: BLUE CROSS/BLUE SHIELD

## 2017-01-09 ENCOUNTER — Ambulatory Visit: Payer: BLUE CROSS/BLUE SHIELD | Admitting: Internal Medicine

## 2017-01-10 ENCOUNTER — Ambulatory Visit (INDEPENDENT_AMBULATORY_CARE_PROVIDER_SITE_OTHER): Payer: BLUE CROSS/BLUE SHIELD

## 2017-01-10 DIAGNOSIS — Z5181 Encounter for therapeutic drug level monitoring: Secondary | ICD-10-CM

## 2017-01-10 DIAGNOSIS — I483 Typical atrial flutter: Secondary | ICD-10-CM

## 2017-01-10 DIAGNOSIS — I4892 Unspecified atrial flutter: Secondary | ICD-10-CM | POA: Diagnosis not present

## 2017-01-10 LAB — POCT INR: INR: 2

## 2017-01-22 ENCOUNTER — Other Ambulatory Visit: Payer: Self-pay | Admitting: Family Medicine

## 2017-01-22 DIAGNOSIS — I10 Essential (primary) hypertension: Secondary | ICD-10-CM

## 2017-01-30 ENCOUNTER — Other Ambulatory Visit: Payer: Self-pay | Admitting: Cardiovascular Disease

## 2017-02-06 NOTE — Progress Notes (Signed)
Cardiology Office Note  Date:  02/07/2017   ID:  [name], DOB January 04, 1961, MRN [id number]  PCP:  Juline Patch, MD   Chief Complaint  Patient presents with  . OTHER    6 month f/u no complaints today. Meds reveiwed verbally with pt.    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 Raul Del  Reports blood pressures well controlled on current medications  Lab work reviewed with him, previous potassium 3.1 Used to be on potassium pills, does not know why he is not on a pill at this time Denies any muscle cramping  If he drinks [employer] coffee gets palpitations Once every 2 months left anterior fascicular block  EKG personally reviewed by myself on todays visit Shows normal sinus rhythm with rate 84 bpm  Other past medical history reviewed On August 2017 he was in atrial flutter with rapid ventricular rate on anticoagulation, started on amiodarone converted to normal sinus rhythm, Amiodarone dose decreased  Prior history ofSores on fingers b/l, Severe symptoms on one  of his fingers, nonhealing ulcerations Improved following vascularsurgery December 2017  presented to the hospital 11/22/2015 with weakness, fall, hemoglobin of 5.5 Guiac negative, GI was consult and colonoscopy and EGD revealed no evidence of bleeding, Further evaluation by hematology confirmed hemolytic anemia, started on high-dose prednisone He did have transfusion 4  seen on a regular basis by Dr. Jefm Bryant for his mixed connective tissue disease Last visit January 2017 per the notes  Previous 30 day monitor that showed 3 episodes of PVCs in a bigeminal manner. He was asymptomatic. Arrhythmia sometimes in the morning, other times in the early evening. Was not on a consistent basis.  admitted to [facility] in April of 2016 for 3 week history of increased dyspnea and palpitations and found to be in new onset atrial flutter.  TTE showed EF 20-25%, global HK, possible bicuspid aortic valve.  successful TEE/DCCV on 4/1. 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   Repeat TTE showed improved EF of 55-60%, select images suggestive of hypokinesis of the inferior and posterior myocardium. LV diastolic parameters were normal. Left atrium was normal in size. RV function was normal. PASP was normal. He was advised further work up if he has symptoms.   On 11/27/14 while at work he suddenly developed onset of palpitations that led to increased SOB, especially with ambulation causing patient to leave work early. Patient checked his pulse and found it to be in the 140's.Pulse went back into the 60's to 80's without intervention.  Recurrent chest pain 7/5 he was woken  up around 2 AM with left shoulder pain that radiated to his left chest.  He presented to [facility] for evaluation. Troponin was found to be mildly elevated and flated trending 0.08-->0.05-->0.06-->0.07.  He underwent cardiac cath 48  hours which showed right dominant system, no significant CAD, normal LV gram with EF >55%  the patient reports hand sensitivity to cold which started about 2 years ago and has been worsening. His fingers turn blue and her with significant pain when he is exposed to cold weather. He usually does better in the summertime. He developed an ulceration on the tip of the left small finger about 3 months ago which has gradually worsened with early gangrenous changes. No trauma.  he underwent upper extremity duplex which showed atypical flow in the ulnar artery and normal flow through the radial artery.  PMH:   has a past medical history of Anemia (10/2015); Atrial flutter ([place]); Gangrene of finger ([place]); History of nuclear stress test; HTN (hypertension); Normal coronary arteries; Pulmonary fibrosis (HCC); RA (rheumatoid arthritis) ([place]); and Tachycardia-induced cardiomyopathy ([place]).  PSH:    Past Surgical History:  Procedure Laterality Date  . CARDIAC CATHETERIZATION N/A 12/03/2014   Procedure: Left Heart Cath and Coronary Angiography;  Surgeon: Minna Merritts, MD;  Location: [place] [facility];  Service: Cardiovascular;  Laterality: N/A;  . COLONOSCOPY WITH PROPOFOL N/A 11/23/2015   Procedure: COLONOSCOPY WITH PROPOFOL;  Surgeon: [name], MD;  Location: [facility] ENDOSCOPY;  Service: Endoscopy;  Laterality: N/A;  . ESOPHAGOGASTRODUODENOSCOPY (EGD) WITH PROPOFOL N/A 11/23/2015   Procedure: ESOPHAGOGASTRODUODENOSCOPY (EGD) WITH PROPOFOL;  Surgeon: [name], MD;  Location: [facility] ENDOSCOPY;  Service: Endoscopy;  Laterality: N/A;  . HAND SURGERY Left   . PERIPHERAL VASCULAR CATHETERIZATION N/A 11/17/2015   Procedure: Upper Extremity Angiography;  Surgeon: Wellington Hampshire, MD;  Location: [place] [facility];  Service: Cardiovascular;  Laterality: N/A;    Current Outpatient Prescriptions  Medication Sig Dispense Refill  . albuterol (PROVENTIL HFA;VENTOLIN HFA) 108 (90 Base) MCG/ACT inhaler Inhale 2  puffs into the lungs 4 (four) times daily as needed for wheezing or shortness of breath. 1 Inhaler 11  . dexlansoprazole (DEXILANT) 60 MG capsule Take 1 capsule (60 mg total) by mouth daily. 30 capsule 11  . diltiazem (CARDIZEM) 30 MG tablet Take 1 tablet (30 mg total) by mouth 3 (three) times daily. 90 tablet 3  . enalapril (VASOTEC) 20 MG tablet Take 1 tablet (20 mg total) by mouth daily. 90 tablet 3  . fluticasone (FLONASE) 50 MCG/ACT nasal spray Place 2 sprays into both nostrils daily. 16 g 0  . Fluticasone-Salmeterol (ADVAIR DISKUS) 250-50 MCG/DOSE AEPB INHALE 1 DOSE BY MOUTH TWICE DAILY. RINSE MOUTH AFTER USE 60 each 11  . hydrochlorothiazide (HYDRODIURIL) 25 MG tablet TAKE 1 TABLET BY MOUTH EVERY DAY. NEED APPT FOR REFILLS 30 tablet 0  . hydroxychloroquine (PLAQUENIL) 200 MG tablet Take 200 mg by mouth daily. Dr Delmer Islam    . meloxicam (MOBIC) 15 MG tablet Take 1 tablet (15 mg total) by mouth daily. 30 tablet 0  . metoprolol (LOPRESSOR) 50 MG tablet Take 75 mg by mouth 2 (two) times daily. Dr Delmer Islam    . mycophenolate (CELLCEPT) 500 MG tablet Take 500 mg by mouth 2 (two) times daily.  11  . pantoprazole (PROTONIX) 40 MG tablet TAKE 1 TABLET (40 MG TOTAL) BY MOUTH DAILY. 30 tablet 6  . predniSONE (DELTASONE) 20 MG tablet TAKE 1 TABLET (20 MG TOTAL) BY MOUTH DAILY WITH BREAKFAST. 30 tablet  0  . warfarin (COUMADIN) 5 MG tablet TAKE AS DIRECTED BY COUMADIN CLINIC 30 tablet 3  . potassium chloride SA (K-DUR,KLOR-CON) 20 MEQ tablet Take 1 tablet (20 mEq total) by mouth daily. 90 tablet 3   No current facility-administered medications for this visit.      Allergies:   Patient has no known allergies.   Social History:  The patient  reports that he quit smoking about 12 years ago. His smoking use included Cigarettes. He has a 20.00 pack-year smoking history. He has never used smokeless tobacco. He reports that he does not drink alcohol or use drugs.   Family History:   family history includes  Leukemia in his mother.    Review of Systems: Review of Systems  Constitutional: Negative.   HENT: Negative.   Respiratory: Positive for shortness of breath.   Cardiovascular: Negative.   Gastrointestinal: Negative.   Musculoskeletal: Negative.   Neurological: Negative.   Psychiatric/Behavioral: Negative.   All other systems reviewed and are negative.    PHYSICAL EXAM: VS:  BP 120/78 (BP Location: Left Arm, Patient Position: Sitting, Cuff Size: Large)   Pulse 84   Ht 5\' 10"  (1.778 m)   Wt 247 lb (112 kg)   BMI 35.44 kg/m  , BMI Body mass index is 35.44 kg/m.  GEN: Well nourished, well developed, in no acute distress , on oxygen, obese HEENT: normal  Neck: no JVD, carotid bruits, or masses Cardiac: RRR; no murmurs, rubs, or gallops,trace LE edema  Respiratory:  Clear with crackles at the bases bilaterally,  normal work of breathing GI: soft, nontender, nondistended, + BS MS: no deformity or atrophy  Skin: warm and dry, no rash Neuro:  Strength and sensation are intact Psych: euthymic mood, full affect    Recent Labs: 07/25/2016: ALT 39 10/10/2016: BUN 26; Creatinine, Ser 1.15; Hemoglobin 14.0; Platelets 220; Potassium 3.1; Sodium 137    Lipid Panel Lab Results  Component Value Date   CHOL 88 08/25/2014   HDL 24 (L) 08/25/2014   LDLCALC 39 08/25/2014   TRIG 124 08/25/2014      Wt Readings from Last 3 Encounters:  02/07/17 247 lb (112 kg)  10/10/16 235 lb 1.9 oz (106.7 kg)  08/29/16 231 lb 8 oz (105 kg)       ASSESSMENT AND PLAN:   Essential hypertension - Plan: EKG 12-Lead Blood pressure is well controlled on today's visit. No changes made to the medications.  Frequent PVCs - Plan: EKG 12-Lead continue beta blockers, currently asymptomatic  Shortness of breath - Plan: EKG 12-Lead underlying lung disease, followed by pulmonary Fibrosis, followed by pulmonary, now on oxygen  Typical atrial flutter (HCC) amiodarone stopped secondary to underlying  lung disease Tolerating warfarin Stay on diltiazem, maintaining normal sinus rhythm  Pulmonary fibrosis (HCC) Currently on oxygen by nasal cannula,  Followed by Dr. Raul Del, slight progression  Encounter for anticoagulation discussion and counseling Tolerating anticoagulation, no bruising or bleeding Prior history of anemia Stable, no recent bleeding  Disposition:   F/U  12 months   Total encounter time more than 25 minutes  Greater than 50% was spent in counseling and coordination of care with the patient    Orders Placed This Encounter  Procedures  . EKG 12-Lead     Signed, Esmond Plants, M.D., Ph.D. 02/07/2017  [place], [place]

## 2017-02-07 ENCOUNTER — Ambulatory Visit (INDEPENDENT_AMBULATORY_CARE_PROVIDER_SITE_OTHER): Payer: BLUE CROSS/BLUE SHIELD | Admitting: Cardiovascular Disease

## 2017-02-07 ENCOUNTER — Encounter: Payer: Self-pay | Admitting: Cardiovascular Disease

## 2017-02-07 ENCOUNTER — Ambulatory Visit (INDEPENDENT_AMBULATORY_CARE_PROVIDER_SITE_OTHER): Payer: BLUE CROSS/BLUE SHIELD

## 2017-02-07 VITALS — BP 120/78 | HR 84 | Ht 70.0 in | Wt 247.0 lb

## 2017-02-07 DIAGNOSIS — Z5181 Encounter for therapeutic drug level monitoring: Secondary | ICD-10-CM

## 2017-02-07 DIAGNOSIS — I483 Typical atrial flutter: Secondary | ICD-10-CM

## 2017-02-07 DIAGNOSIS — I10 Essential (primary) hypertension: Secondary | ICD-10-CM | POA: Diagnosis not present

## 2017-02-07 DIAGNOSIS — J8410 Pulmonary fibrosis, unspecified: Secondary | ICD-10-CM

## 2017-02-07 DIAGNOSIS — R000 Tachycardia, unspecified: Secondary | ICD-10-CM

## 2017-02-07 DIAGNOSIS — I43 Cardiomyopathy in diseases classified elsewhere: Secondary | ICD-10-CM | POA: Diagnosis not present

## 2017-02-07 DIAGNOSIS — Z7189 Other specified counseling: Secondary | ICD-10-CM | POA: Diagnosis not present

## 2017-02-07 LAB — POCT INR: INR: 1.7

## 2017-02-07 MED ORDER — POTASSIUM CHLORIDE CRYS ER 20 MEQ PO TBCR: 20.0000 meq | EXTENDED_RELEASE_TABLET | Freq: Every day | ORAL | 3 refills | Status: DC

## 2017-02-07 NOTE — Patient Instructions (Signed)

## 2017-02-12 ENCOUNTER — Other Ambulatory Visit: Payer: Self-pay | Admitting: Internal Medicine

## 2017-02-13 ENCOUNTER — Inpatient Hospital Stay: Payer: BLUE CROSS/BLUE SHIELD

## 2017-02-13 ENCOUNTER — Other Ambulatory Visit: Payer: Self-pay | Admitting: *Deleted

## 2017-02-13 ENCOUNTER — Inpatient Hospital Stay: Payer: BLUE CROSS/BLUE SHIELD | Attending: Internal Medicine | Admitting: Internal Medicine

## 2017-02-13 VITALS — BP 139/92 | HR 67 | Temp 97.0°F | Resp 16 | Wt 249.1 lb

## 2017-02-13 DIAGNOSIS — J8410 Pulmonary fibrosis, unspecified: Secondary | ICD-10-CM | POA: Diagnosis not present

## 2017-02-13 DIAGNOSIS — I4891 Unspecified atrial fibrillation: Secondary | ICD-10-CM | POA: Insufficient documentation

## 2017-02-13 DIAGNOSIS — D591 Autoimmune hemolytic anemia, unspecified: Secondary | ICD-10-CM

## 2017-02-13 DIAGNOSIS — Z79899 Other long term (current) drug therapy: Secondary | ICD-10-CM | POA: Diagnosis not present

## 2017-02-13 DIAGNOSIS — I4892 Unspecified atrial flutter: Secondary | ICD-10-CM | POA: Diagnosis not present

## 2017-02-13 DIAGNOSIS — Z806 Family history of leukemia: Secondary | ICD-10-CM | POA: Insufficient documentation

## 2017-02-13 DIAGNOSIS — I429 Cardiomyopathy, unspecified: Secondary | ICD-10-CM

## 2017-02-13 DIAGNOSIS — E876 Hypokalemia: Secondary | ICD-10-CM | POA: Diagnosis not present

## 2017-02-13 DIAGNOSIS — I10 Essential (primary) hypertension: Secondary | ICD-10-CM | POA: Diagnosis not present

## 2017-02-13 DIAGNOSIS — D594 Other nonautoimmune hemolytic anemias: Secondary | ICD-10-CM | POA: Diagnosis not present

## 2017-02-13 DIAGNOSIS — Z7952 Long term (current) use of systemic steroids: Secondary | ICD-10-CM | POA: Diagnosis not present

## 2017-02-13 DIAGNOSIS — Z7901 Long term (current) use of anticoagulants: Secondary | ICD-10-CM | POA: Insufficient documentation

## 2017-02-13 DIAGNOSIS — M069 Rheumatoid arthritis, unspecified: Secondary | ICD-10-CM | POA: Diagnosis not present

## 2017-02-13 DIAGNOSIS — Z87891 Personal history of nicotine dependence: Secondary | ICD-10-CM | POA: Diagnosis not present

## 2017-02-13 LAB — CBC WITH DIFFERENTIAL/PLATELET
Basophils Absolute: 0.1 10*3/uL (ref 0–0.1)
Basophils Relative: 1 %
EOS PCT: 2 %
Eosinophils Absolute: 0.2 10*3/uL (ref 0–0.7)
HCT: 44.3 % (ref 40.0–52.0)
Hemoglobin: 14.4 g/dL (ref 13.0–18.0)
LYMPHS ABS: 1.6 10*3/uL (ref 1.0–3.6)
Lymphocytes Relative: 14 %
MCH: 28 pg (ref 26.0–34.0)
MCHC: 32.6 g/dL (ref 32.0–36.0)
MCV: 85.9 fL (ref 80.0–100.0)
MONO ABS: 0.8 10*3/uL (ref 0.2–1.0)
MONOS PCT: 7 %
Neutro Abs: 9 10*3/uL — ABNORMAL HIGH (ref 1.4–6.5)
Neutrophils Relative %: 77 %
PLATELETS: 191 10*3/uL (ref 150–440)
RBC: 5.16 MIL/uL (ref 4.40–5.90)
RDW: 15.1 % — ABNORMAL HIGH (ref 11.5–14.5)
WBC: 11.7 10*3/uL — AB (ref 3.8–10.6)

## 2017-02-13 LAB — COMPREHENSIVE METABOLIC PANEL
ALBUMIN: 3.9 g/dL (ref 3.5–5.0)
ALT: 19 U/L (ref 17–63)
AST: 25 U/L (ref 15–41)
Alkaline Phosphatase: 42 U/L (ref 38–126)
Anion gap: 8 (ref 5–15)
BILIRUBIN TOTAL: 0.6 mg/dL (ref 0.3–1.2)
BUN: 23 mg/dL — AB (ref 6–20)
CO2: 30 mmol/L (ref 22–32)
CREATININE: 1.17 mg/dL (ref 0.61–1.24)
Calcium: 9 mg/dL (ref 8.9–10.3)
Chloride: 105 mmol/L (ref 101–111)
GFR calc Af Amer: >60 mL/min (ref >60)
GLUCOSE: 85 mg/dL (ref 65–99)
POTASSIUM: 3.6 mmol/L (ref 3.5–5.1)
Sodium: 143 mmol/L (ref 135–145)
TOTAL PROTEIN: 7.1 g/dL (ref 6.5–8.1)

## 2017-02-13 LAB — RETICULOCYTES
RBC.: 5.05 MIL/uL (ref 4.40–5.90)
RETIC COUNT ABSOLUTE: 55.6 10*3/uL (ref 19.0–183.0)
Retic Ct Pct: 1.1 % (ref 0.4–3.1)

## 2017-02-13 LAB — LACTATE DEHYDROGENASE: LDH: 236 U/L — ABNORMAL HIGH (ref 98–192)

## 2017-02-13 MED ORDER — FOLIC ACID 1 MG PO TABS: 1.0000 mg | ORAL_TABLET | Freq: Every day | ORAL | 1 refills | Status: DC

## 2017-02-13 NOTE — Progress Notes (Signed)
[place] OFFICE PROGRESS NOTE  Patient Care Team: Juline Patch, MD as PCP - General (Family Medicine) Minna Merritts, MD as Consulting Physician (Cardiology)    No history exists.   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  male pleasant patient above history of Autoimmune hemolytic anemia- currently on prednisone 20 mg/CellCept 500 twice a day [also for Autoimmune/connective tissue disorder]- is here for follow-up.   Patient's appetite is good. Patient denies any worsening shortness of breath or chest pain. Denies any difficulty swallowing. No nausea no vomiting. Patient  feels back to his baseline.  No difficulty swallowing pain with swallowing. Patient has been switched from Xarelto to Coumadin because of cost issues.  REVIEW OF SYSTEMS:  A complete 10 point review of system is done which is negative except mentioned above/history of present illness.   PAST MEDICAL HISTORY :  Past Medical History:  Diagnosis Date  . Anemia 10/2015  . Atrial flutter ([name])    a. on Xarelto; b. s/p successful TEE/DCCV on 08/28/2014  . Gangrene of finger ([place])   . History of nuclear stress test    a. 09/02/2014: no sig ischemia, GI uptake noted, no sig WMA, EF 48%, no EKG chanes concerning for ischemia, low risk scan    . HTN (hypertension)   . Normal coronary arteries    a. cardiac cath 12/03/2014: no sigificant CAD, right dominant system, LVEF >55%, no MR or AS  . Pulmonary fibrosis ([place])    a. not on home oxygen  . RA (rheumatoid arthritis) ([place])   . Tachycardia-induced cardiomyopathy ([place])    a.  echo 07/2014: EF 20-25%, cannot exclude atrial flutter,  global HK, possible bicuspid Ao valve, mod reduced RV sys fxn, mild-mod aortic valve sclerosis/calcification, mod TR, mildly elevated RVSP; b. TEE 08/28/2014: EF 30-35%, no intracardic thrombus, mildly dilated LA/RA, mild TR, mild-mod aortic sclerosis w/o stenosis; c. echo 09/2014: EF 55-60%, select images w/ inf HK    PAST SURGICAL HISTORY :   Past Surgical History:  Procedure Laterality Date  . CARDIAC CATHETERIZATION N/A 12/03/2014   Procedure: Left Heart Cath and Coronary Angiography;  Surgeon: Minna Merritts, MD;  Location: [place] [facility];  Service: Cardiovascular;  Laterality: N/A;  . COLONOSCOPY WITH PROPOFOL N/A 11/23/2015   Procedure: COLONOSCOPY WITH PROPOFOL;  Surgeon: [name], MD;  Location: [facility] ENDOSCOPY;  Service: Endoscopy;  Laterality: N/A;  . ESOPHAGOGASTRODUODENOSCOPY (EGD) WITH PROPOFOL N/A 11/23/2015   Procedure: ESOPHAGOGASTRODUODENOSCOPY (EGD) WITH PROPOFOL;  Surgeon: [name], MD;  Location: [facility] ENDOSCOPY;  Service: Endoscopy;  Laterality: N/A;  . HAND SURGERY Left   . PERIPHERAL VASCULAR CATHETERIZATION N/A 11/17/2015   Procedure: Upper Extremity Angiography;  Surgeon: Wellington Hampshire, MD;  Location: [place] [facility];  Service: Cardiovascular;  Laterality: N/A;    FAMILY HISTORY :   Family History  Problem Relation Age of Onset  . Leukemia Mother     SOCIAL HISTORY:   Social History  Substance Use Topics  . Smoking status: Former Smoker    Packs/day: 1.00    Years: 20.00    Types: Cigarettes    Quit date: 08/30/2004  . Smokeless tobacco: Never Used  .  Alcohol use No    ALLERGIES:  has No Known Allergies.  MEDICATIONS:  Current Outpatient Prescriptions  Medication Sig Dispense Refill  . albuterol (PROVENTIL HFA;VENTOLIN HFA) 108 (90 Base) MCG/ACT inhaler Inhale 2 puffs into the lungs 4 (four) times daily as needed for wheezing or shortness of breath. 1 Inhaler 11  . dexlansoprazole  (DEXILANT) 60 MG capsule Take 1 capsule (60 mg total) by mouth daily. 30 capsule 11  . diltiazem (CARDIZEM) 30 MG tablet Take 1 tablet (30 mg total) by mouth 3 (three) times daily. 90 tablet 3  . enalapril (VASOTEC) 20 MG tablet Take 1 tablet (20 mg total) by mouth daily. 90 tablet 3  . fluticasone (FLONASE) 50 MCG/ACT nasal spray Place 2 sprays into both nostrils daily. 16 g 0  . Fluticasone-Salmeterol (ADVAIR DISKUS) 250-50 MCG/DOSE AEPB INHALE 1 DOSE BY MOUTH TWICE DAILY. RINSE MOUTH AFTER USE 60 each 11  . hydrochlorothiazide (HYDRODIURIL) 25 MG tablet TAKE 1 TABLET BY MOUTH EVERY DAY. NEED APPT FOR REFILLS 30 tablet 0  . hydroxychloroquine (PLAQUENIL) 200 MG tablet Take 200 mg by mouth daily. Dr Delmer Islam    . meloxicam (MOBIC) 15 MG tablet Take 1 tablet (15 mg total) by mouth daily. 30 tablet 0  . metoprolol (LOPRESSOR) 50 MG tablet Take 75 mg by mouth 2 (two) times daily. Dr Delmer Islam    . mycophenolate (CELLCEPT) 500 MG tablet Take 500 mg by mouth 2 (two) times daily.  11  . pantoprazole (PROTONIX) 40 MG tablet TAKE 1 TABLET (40 MG TOTAL) BY MOUTH DAILY. 30 tablet 6  . potassium chloride SA (K-DUR,KLOR-CON) 20 MEQ tablet Take 1 tablet (20 mEq total) by mouth daily. 90 tablet 3  . predniSONE (DELTASONE) 20 MG tablet TAKE 1 TABLET (20 MG TOTAL) BY MOUTH DAILY WITH BREAKFAST. 30 tablet 0  . warfarin (COUMADIN) 5 MG tablet TAKE AS DIRECTED BY COUMADIN CLINIC 30 tablet 3  . folic acid (FOLVITE) 1 MG tablet Take 1 tablet (1 mg total) by mouth daily. 90 tablet 1   No current facility-administered medications for this visit.     PHYSICAL EXAMINATION:   BP (!) 139/92 (BP Location: Left Arm, Patient Position: Sitting)   Pulse 67   Temp (!) 97 F (36.1 C) (Tympanic)   Resp 16   Wt 249 lb 1.9 oz (113 kg)   BMI 35.74 kg/m   Filed Weights   02/13/17 1042  Weight: 249 lb 1.9 oz (113 kg)    GENERAL: Well-nourished well-developed; Alert, no distress and comfortable.  Alone.  EYES: no pallor  or icterus OROPHARYNX: no thrush or ulceration; good dentition  NECK: supple, no masses felt LYMPH:  no palpable lymphadenopathy in the cervical, axillary or inguinal regions LUNGS: clear to auscultation and  No wheeze or crackles HEART/CVS: regular rate & rhythm and no murmurs; No lower extremity edema ABDOMEN:abdomen soft, non-tender and normal bowel sounds Musculoskeletal:no cyanosis of digits and no clubbing  PSYCH: alert & oriented x 3 with fluent speech NEURO: no focal motor/sensory deficits SKIN:  no rashes or significant lesions  LABORATORY DATA:  I have reviewed the data as listed    Component Value Date/Time   NA 143 02/13/2017 1030   NA 138 12/11/2014 1518   NA 137 08/27/2014 0403   K 3.6 02/13/2017 1030   K 4.1 08/27/2014 0403   CL 105 02/13/2017 1030   CL 108 08/27/2014 0403   CO2 30 02/13/2017 1030   CO2 26 08/27/2014 0403   GLUCOSE 85  02/13/2017 1030   GLUCOSE 94 08/27/2014 0403   BUN 23 (H) 02/13/2017 1030   BUN 24 12/11/2014 1518   BUN 16 08/27/2014 0403   CREATININE 1.17 02/13/2017 1030   CREATININE 0.85 08/27/2014 0403   CALCIUM 9.0 02/13/2017 1030   CALCIUM 8.6 (L) 08/27/2014 0403   PROT 7.1 02/13/2017 1030   ALBUMIN 3.9 02/13/2017 1030   AST 25 02/13/2017 1030   ALT 19 02/13/2017 1030   ALKPHOS 42 02/13/2017 1030   BILITOT 0.6 02/13/2017 1030   GFRNONAA >60 02/13/2017 1030   GFRNONAA >60 08/27/2014 0403   GFRAA >60 02/13/2017 1030   GFRAA >60 08/27/2014 0403    No results found for: SPEP, UPEP  Lab Results  Component Value Date   WBC 11.7 (H) 02/13/2017   NEUTROABS 9.0 (H) 02/13/2017   HGB 14.4 02/13/2017   HCT 44.3 02/13/2017   MCV 85.9 02/13/2017   PLT 191 02/13/2017      Chemistry      Component Value Date/Time   NA 143 02/13/2017 1030   NA 138 12/11/2014 1518   NA 137 08/27/2014 0403   K 3.6 02/13/2017 1030   K 4.1 08/27/2014 0403   CL 105 02/13/2017 1030   CL 108 08/27/2014 0403   CO2 30 02/13/2017 1030   CO2 26 08/27/2014  0403   BUN 23 (H) 02/13/2017 1030   BUN 24 12/11/2014 1518   BUN 16 08/27/2014 0403   CREATININE 1.17 02/13/2017 1030   CREATININE 0.85 08/27/2014 0403      Component Value Date/Time   CALCIUM 9.0 02/13/2017 1030   CALCIUM 8.6 (L) 08/27/2014 0403   ALKPHOS 42 02/13/2017 1030   AST 25 02/13/2017 1030   ALT 19 02/13/2017 1030   BILITOT 0.6 02/13/2017 1030       RADIOGRAPHIC STUDIES: I have personally reviewed the radiological images as listed and agreed with the findings in the report. No results found.   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 Recommend taper to 10 mg/day. Will monitor blood counts; recommend folic acid [no inetarction with coumaidn]    # History of A. Fib- on coumadin [ switched from Xarelto sec to cost issues]  # Hypokalemia- 3.6/im[orved on diuretic. nutrition supp.   # repeat H&H/LDH q 2 weeks; follow up in 2 months/labs. If hemoglobin starts dropping while tapering the steroids- I would recommend increasing the dose of CellCept.   Orders Placed This Encounter  Procedures  . Hemoglobin [facility])    Standing Status:   Standing    Number of Occurrences:   10    Standing Expiration Date:   02/13/2018  . Hematocrit ([facility])    Standing Status:   Standing    Number of Occurrences:   10    Standing Expiration Date:   02/13/2018  . Lactate dehydrogenase    Standing Status:   Standing    Number of Occurrences:   10    Standing Expiration Date:   02/13/2018   All questions were answered. The patient knows to call the clinic with any problems, questions or concerns.      Cammie Sickle, MD 02/13/2017 6:42 PM

## 2017-02-13 NOTE — Assessment & Plan Note (Addendum)
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 Recommend taper to 10 mg/day. Will monitor blood counts; recommend folic acid [no inetarction with coumaidn]    # History of A. Fib- on coumadin [ switched from Xarelto sec to cost issues]  # Hypokalemia- 3.6/im[orved on diuretic. nutrition supp.   # repeat H&H/LDH q 2 weeks; follow up in 2 months/labs. If hemoglobin starts dropping while tapering the steroids- I would recommend increasing the dose of CellCept.

## 2017-02-21 ENCOUNTER — Other Ambulatory Visit: Payer: Self-pay | Admitting: Cardiovascular Disease

## 2017-02-21 ENCOUNTER — Ambulatory Visit (INDEPENDENT_AMBULATORY_CARE_PROVIDER_SITE_OTHER): Payer: BLUE CROSS/BLUE SHIELD

## 2017-02-21 ENCOUNTER — Other Ambulatory Visit: Payer: Self-pay | Admitting: Family Medicine

## 2017-02-21 DIAGNOSIS — I483 Typical atrial flutter: Secondary | ICD-10-CM

## 2017-02-21 DIAGNOSIS — Z5181 Encounter for therapeutic drug level monitoring: Secondary | ICD-10-CM | POA: Diagnosis not present

## 2017-02-21 DIAGNOSIS — I10 Essential (primary) hypertension: Secondary | ICD-10-CM

## 2017-02-21 LAB — POCT INR: INR: 1.6

## 2017-02-27 ENCOUNTER — Inpatient Hospital Stay: Payer: BLUE CROSS/BLUE SHIELD | Attending: Internal Medicine

## 2017-02-27 ENCOUNTER — Other Ambulatory Visit: Payer: Self-pay

## 2017-02-27 DIAGNOSIS — D594 Other nonautoimmune hemolytic anemias: Secondary | ICD-10-CM

## 2017-02-27 DIAGNOSIS — M069 Rheumatoid arthritis, unspecified: Secondary | ICD-10-CM | POA: Diagnosis not present

## 2017-02-27 DIAGNOSIS — I429 Cardiomyopathy, unspecified: Secondary | ICD-10-CM | POA: Insufficient documentation

## 2017-02-27 DIAGNOSIS — Z7901 Long term (current) use of anticoagulants: Secondary | ICD-10-CM | POA: Diagnosis not present

## 2017-02-27 DIAGNOSIS — Z87891 Personal history of nicotine dependence: Secondary | ICD-10-CM | POA: Diagnosis not present

## 2017-02-27 DIAGNOSIS — Z7952 Long term (current) use of systemic steroids: Secondary | ICD-10-CM | POA: Diagnosis not present

## 2017-02-27 DIAGNOSIS — E876 Hypokalemia: Secondary | ICD-10-CM | POA: Insufficient documentation

## 2017-02-27 DIAGNOSIS — Z79899 Other long term (current) drug therapy: Secondary | ICD-10-CM | POA: Diagnosis not present

## 2017-02-27 DIAGNOSIS — Z806 Family history of leukemia: Secondary | ICD-10-CM | POA: Diagnosis not present

## 2017-02-27 DIAGNOSIS — I4891 Unspecified atrial fibrillation: Secondary | ICD-10-CM | POA: Diagnosis not present

## 2017-02-27 DIAGNOSIS — I10 Essential (primary) hypertension: Secondary | ICD-10-CM | POA: Diagnosis not present

## 2017-02-27 DIAGNOSIS — J8410 Pulmonary fibrosis, unspecified: Secondary | ICD-10-CM | POA: Diagnosis not present

## 2017-02-27 LAB — LACTATE DEHYDROGENASE: LDH: 214 U/L — ABNORMAL HIGH (ref 98–192)

## 2017-02-27 LAB — HEMATOCRIT: HCT: 44.8 % (ref 40.0–52.0)

## 2017-02-27 LAB — HEMOGLOBIN: HEMOGLOBIN: 14.5 g/dL (ref 13.0–18.0)

## 2017-03-07 ENCOUNTER — Ambulatory Visit (INDEPENDENT_AMBULATORY_CARE_PROVIDER_SITE_OTHER): Payer: BLUE CROSS/BLUE SHIELD

## 2017-03-07 DIAGNOSIS — Z5181 Encounter for therapeutic drug level monitoring: Secondary | ICD-10-CM | POA: Diagnosis not present

## 2017-03-07 DIAGNOSIS — I483 Typical atrial flutter: Secondary | ICD-10-CM

## 2017-03-07 LAB — POCT INR: INR: 2.5

## 2017-03-13 ENCOUNTER — Other Ambulatory Visit: Payer: Self-pay

## 2017-03-13 ENCOUNTER — Inpatient Hospital Stay: Payer: BLUE CROSS/BLUE SHIELD

## 2017-03-13 DIAGNOSIS — D594 Other nonautoimmune hemolytic anemias: Secondary | ICD-10-CM

## 2017-03-13 LAB — HEMOGLOBIN: HEMOGLOBIN: 14.5 g/dL (ref 13.0–18.0)

## 2017-03-13 LAB — LACTATE DEHYDROGENASE: LDH: 255 U/L — ABNORMAL HIGH (ref 98–192)

## 2017-03-13 LAB — HEMATOCRIT: HEMATOCRIT: 44.1 % (ref 40.0–52.0)

## 2017-03-16 ENCOUNTER — Other Ambulatory Visit: Payer: Self-pay | Admitting: Cardiovascular Disease

## 2017-03-22 ENCOUNTER — Telehealth: Payer: Self-pay | Admitting: Cardiovascular Disease

## 2017-03-22 NOTE — Telephone Encounter (Signed)
Received records request [place], forwarded to [facility] for processing.

## 2017-03-26 ENCOUNTER — Other Ambulatory Visit: Payer: Self-pay | Admitting: Cardiovascular Disease

## 2017-03-26 DIAGNOSIS — I10 Essential (primary) hypertension: Secondary | ICD-10-CM

## 2017-03-27 ENCOUNTER — Inpatient Hospital Stay: Payer: BLUE CROSS/BLUE SHIELD

## 2017-03-27 DIAGNOSIS — D594 Other nonautoimmune hemolytic anemias: Secondary | ICD-10-CM | POA: Diagnosis not present

## 2017-03-27 LAB — HEMOGLOBIN: Hemoglobin: 14.5 g/dL (ref 13.0–18.0)

## 2017-03-27 LAB — HEMATOCRIT: HCT: 44.7 % (ref 40.0–52.0)

## 2017-03-27 LAB — LACTATE DEHYDROGENASE: LDH: 203 U/L — ABNORMAL HIGH (ref 98–192)

## 2017-03-28 ENCOUNTER — Other Ambulatory Visit: Payer: Self-pay

## 2017-03-28 ENCOUNTER — Telehealth: Payer: Self-pay | Admitting: Cardiovascular Disease

## 2017-03-28 ENCOUNTER — Ambulatory Visit (INDEPENDENT_AMBULATORY_CARE_PROVIDER_SITE_OTHER): Payer: BLUE CROSS/BLUE SHIELD

## 2017-03-28 DIAGNOSIS — Z5181 Encounter for therapeutic drug level monitoring: Secondary | ICD-10-CM

## 2017-03-28 DIAGNOSIS — I483 Typical atrial flutter: Secondary | ICD-10-CM | POA: Diagnosis not present

## 2017-03-28 LAB — POCT INR: INR: 1.7

## 2017-03-28 NOTE — Telephone Encounter (Signed)
Spoke with patient and he states that the 120 mg dosage is on backorder with pharmacy. Will call pharmacy to see exactly what is available and other options.

## 2017-03-28 NOTE — Telephone Encounter (Signed)
PT is in office for a coumadin appt PT states his diltiazem is running out - will be out tomorrow His pharmacy is on back order  [facility] states there is a medication that works for the 24 hour not the 12 and he takes one 12 a day PT would like to know if he can take this medication, if so he would like a prescription  Please advise

## 2017-03-28 NOTE — Telephone Encounter (Signed)
Spoke with Caryl Pina at [facility] and they have diltiazem 120 mg 24 hour extended release but not the 12 hour release. The 12 hour release is on backorder due to [employer]. Will route to Dr. Rockey Situ for review and recommendations.

## 2017-03-29 MED ORDER — DILTIAZEM HCL ER 120 MG PO CP24: 120.0000 mg | ORAL_CAPSULE | Freq: Every day | ORAL | 3 refills | Status: DC

## 2017-03-29 NOTE — Telephone Encounter (Signed)
Pt states he is completely out of the Diltiazem.

## 2017-03-29 NOTE — Telephone Encounter (Signed)
S/w Dr Rockey Situ. He confirmed ok to send in Rx for diltiazem 120 mg extended release tablet by mouth once a day. Rx sent to [facility].

## 2017-03-29 NOTE — Telephone Encounter (Signed)
S/w Janene at [facility]. Stated the the Diltiazem 120mg  12 hour tablet is not available from the manufacturer at this time. Diltiazem120 mg extended-release is available. They need clarification from doctor if ok to order extended-release tablet. Patient would like Rx sent to [facility].

## 2017-03-29 NOTE — Telephone Encounter (Signed)
Janene from [facility] is calling to verify about the diltiazem (120 mg) 12 hour capsule  Those are currently not avaliable Janene would like to discuss with nurse  Please call [phone]

## 2017-04-05 ENCOUNTER — Other Ambulatory Visit: Payer: Self-pay | Admitting: Cardiovascular Disease

## 2017-04-05 NOTE — Telephone Encounter (Signed)
Refill Request.  

## 2017-04-05 NOTE — Telephone Encounter (Signed)
Please review for refill, Thanks !  

## 2017-04-07 IMAGING — CT CT CHEST W/ CM
2 of 4 series · 15 of 36 positions shown, 18 images · IV contrast (iopamidol)
Comparison: Radiographs 12/01/2014.  CT 08/19/2014 and 12/26/2013.

CLINICAL DATA: History of shortness of breath, postinflammatory
pulmonary fibrosis and mixed connective tissue disease. Follow up
chronic lung disease.

EXAM:
CT CHEST WITH CONTRAST
TECHNIQUE: Multidetector CT imaging of the chest was performed during
intravenous contrast administration.
CONTRAST:  75mL 18DYFI-DYY IOPAMIDOL (18DYFI-DYY) INJECTION 61%

[Series 3: lungs · axial · 0.77mm/px · z∈[-554,-228]mm · 12 of 183 slices shown, 15 images]
[im 10/183  mediastinal]
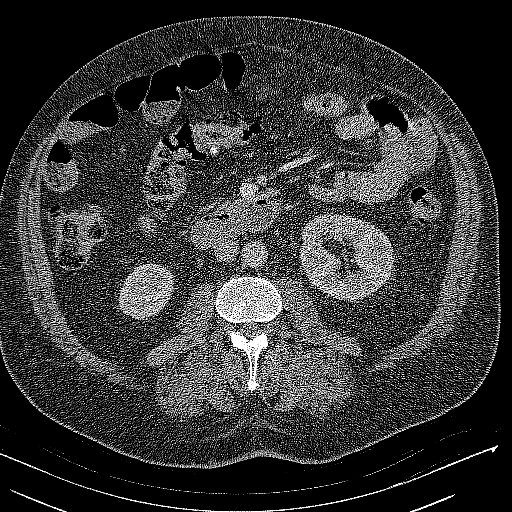
[im 10/183  lung]
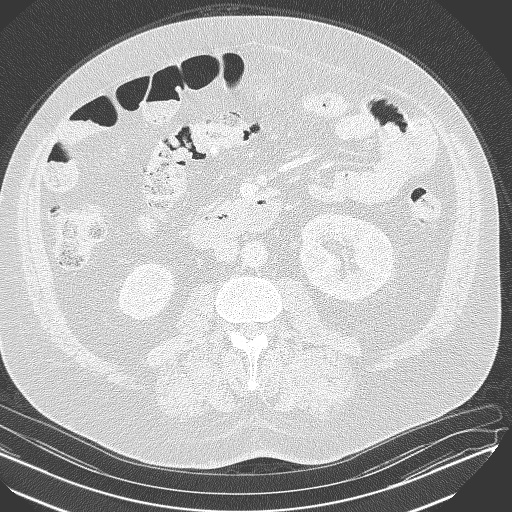
[im 29/183  lung]
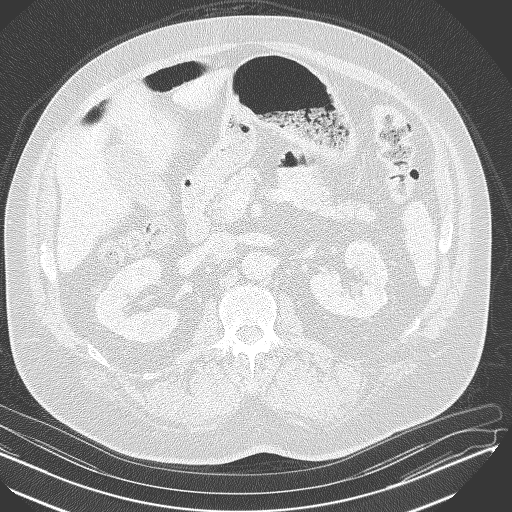
[im 39/183  lung]
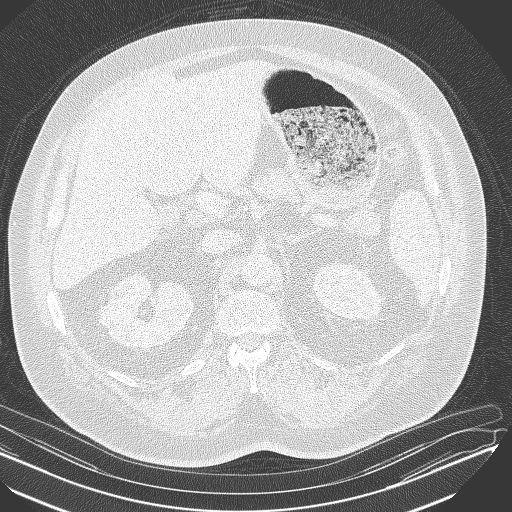
[im 58/183  lung]
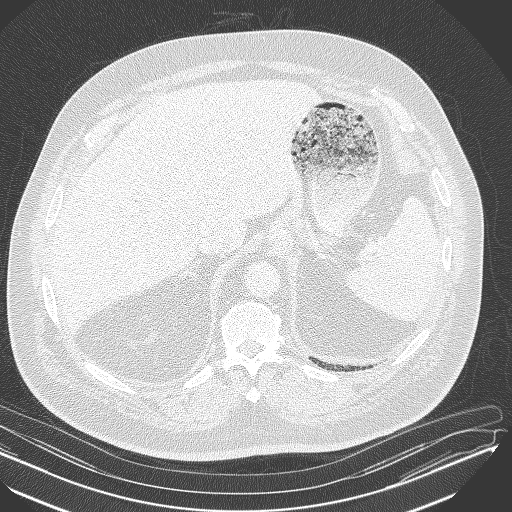
[im 68/183  mediastinal]
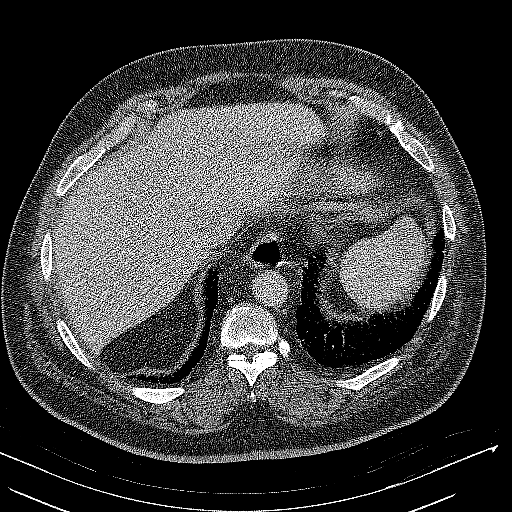
[im 68/183  lung]
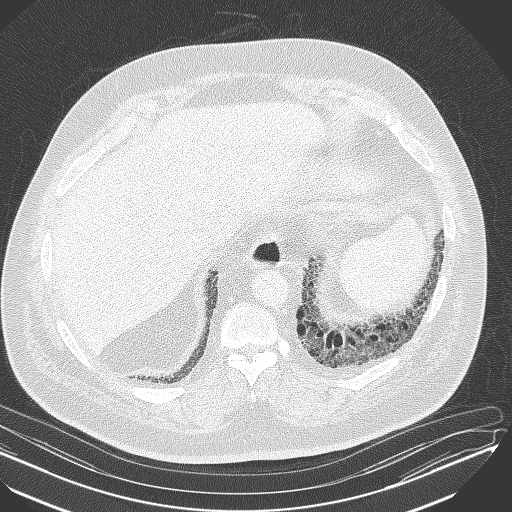
[im 87/183  lung]
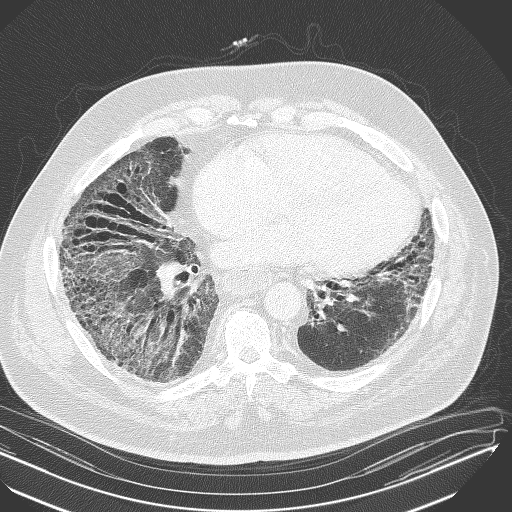
[im 96/183  lung]
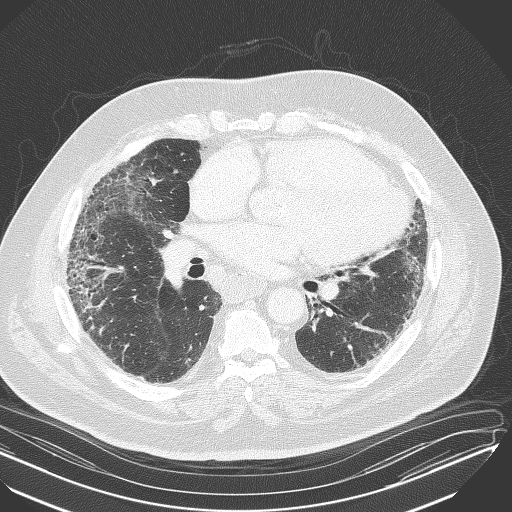
[im 115/183  lung]
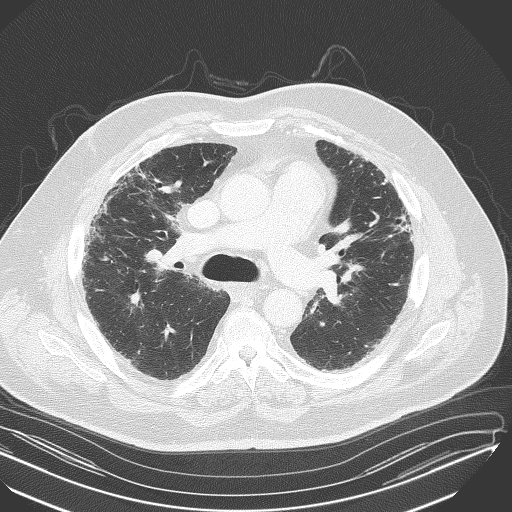
[im 125/183  mediastinal]
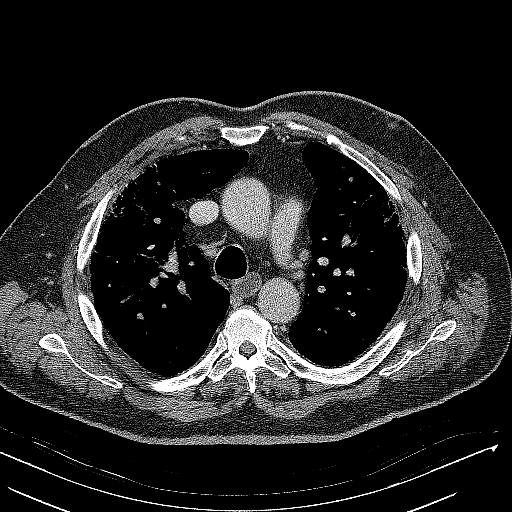
[im 125/183  lung]
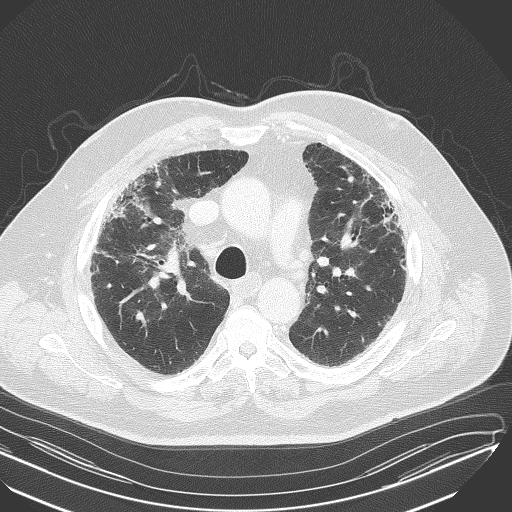
[im 144/183  lung]
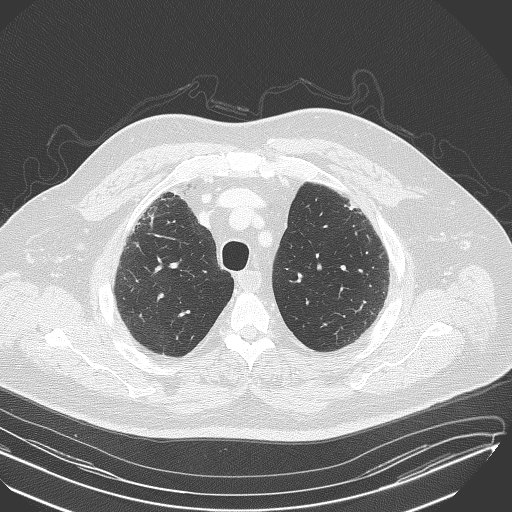
[im 154/183  lung]
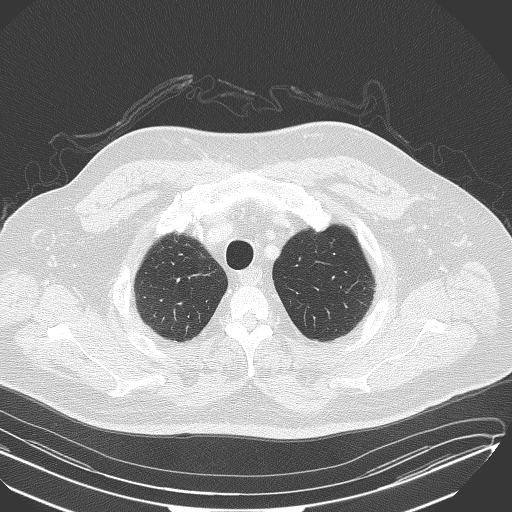
[im 173/183  lung]
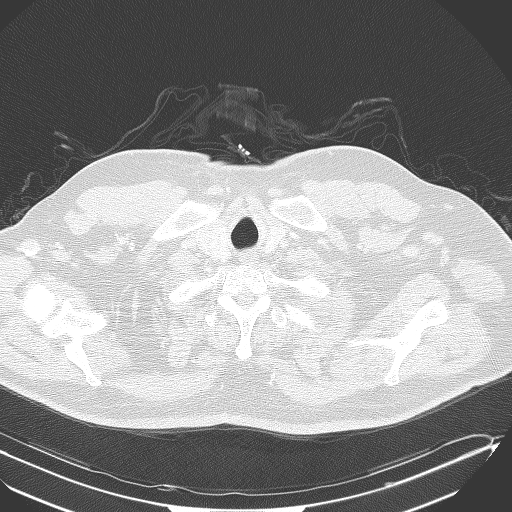

[Series 602: coronal · coronal · 0.77mm/px · 3 of 134 slices shown]
[im 27/134  lung]
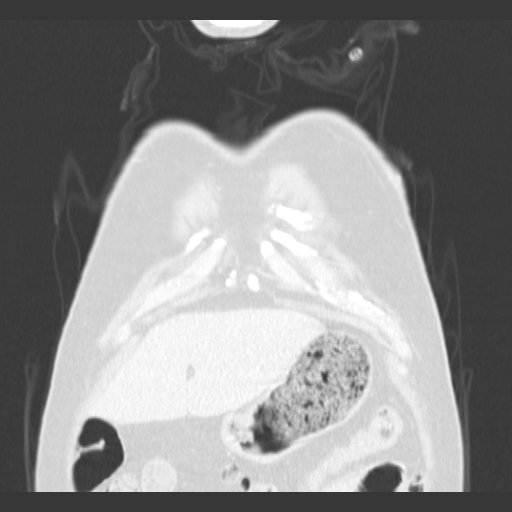
[im 54/134  lung]
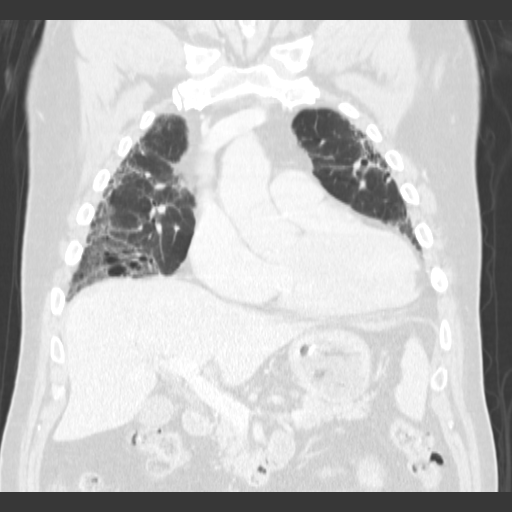
[im 80/134  lung]
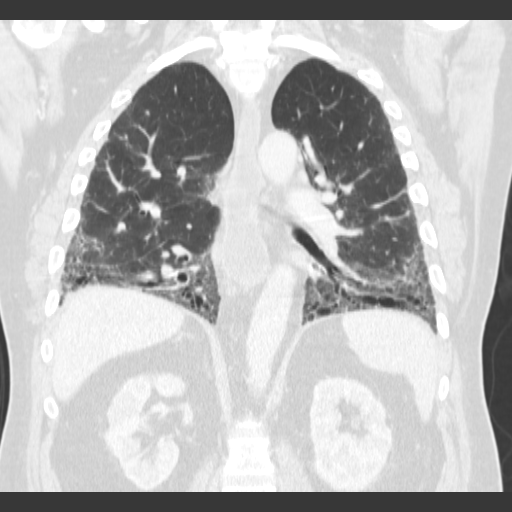

[15 of 36 positions shown; findings below may reference images not displayed]

FINDINGS: Cardiovascular: No acute vascular findings or significant
atherosclerosis demonstrated. There is central enlargement of the
pulmonary arteries consistent with pulmonary arterial hypertension.
Probable aortic valvular calcifications. The heart is enlarged.
There is no pericardial effusion.

Mediastinum/Nodes: The previously demonstrated mediastinal and hilar
adenopathy appears improved. There is a right infrahilar node
measuring 12 mm on image 37. There is no axillary lymphadenopathy.A
the 2.3 x 1.9 cm right thyroid nodule on image 40 is grossly stable.
The esophagus is fluid filled and mildly distended.

Lungs/Pleura: There is no pleural effusion. There is grossly stable
chronic lung disease with subpleural reticulation, architectural
distortion and cylindrical bronchiectasis, greatest in the lower
lobes. There is no residual superimposed basilar airspace disease as
seen on the prior study. There is no endobronchial lesion or
suspicious pulmonary nodule.

Upper abdomen: No acute findings are seen within the visualized
upper abdomen. There is cortical scarring in the right kidney. No
adrenal mass.

Musculoskeletal/Chest wall: There is no chest wall mass or
suspicious osseous finding.
IMPRESSION: 1. Interval resolution of previously demonstrated bibasilar airspace
opacities.
2. Underlying chronic lung disease is stable with cylindrical
bronchiectasis, subpleural reticulation and architectural distortion
compatible with usual interstitial pneumonitis (UIP). This could be
related to underlying connective tissue disorder. The esophagus
remains mildly distended and fluid-filled.
3. Central enlargement of the pulmonary arteries, suggesting
pulmonary arterial hypertension.
4. Stable right thyroid nodule.

## 2017-04-10 ENCOUNTER — Inpatient Hospital Stay: Payer: BLUE CROSS/BLUE SHIELD | Attending: Internal Medicine | Admitting: Internal Medicine

## 2017-04-10 ENCOUNTER — Inpatient Hospital Stay: Payer: BLUE CROSS/BLUE SHIELD

## 2017-04-10 ENCOUNTER — Encounter: Payer: Self-pay | Admitting: Internal Medicine

## 2017-04-10 VITALS — BP 136/89 | HR 78 | Temp 99.1°F | Resp 16 | Wt 253.3 lb

## 2017-04-10 DIAGNOSIS — D594 Other nonautoimmune hemolytic anemias: Secondary | ICD-10-CM

## 2017-04-10 DIAGNOSIS — Z87891 Personal history of nicotine dependence: Secondary | ICD-10-CM | POA: Insufficient documentation

## 2017-04-10 DIAGNOSIS — I429 Cardiomyopathy, unspecified: Secondary | ICD-10-CM | POA: Insufficient documentation

## 2017-04-10 DIAGNOSIS — I4891 Unspecified atrial fibrillation: Secondary | ICD-10-CM | POA: Insufficient documentation

## 2017-04-10 DIAGNOSIS — I10 Essential (primary) hypertension: Secondary | ICD-10-CM | POA: Insufficient documentation

## 2017-04-10 DIAGNOSIS — M069 Rheumatoid arthritis, unspecified: Secondary | ICD-10-CM | POA: Diagnosis not present

## 2017-04-10 DIAGNOSIS — Z79899 Other long term (current) drug therapy: Secondary | ICD-10-CM | POA: Insufficient documentation

## 2017-04-10 DIAGNOSIS — Z7901 Long term (current) use of anticoagulants: Secondary | ICD-10-CM | POA: Insufficient documentation

## 2017-04-10 LAB — HEMATOCRIT: HCT: 42.8 % (ref 40.0–52.0)

## 2017-04-10 LAB — HEMOGLOBIN: HEMOGLOBIN: 14 g/dL (ref 13.0–18.0)

## 2017-04-10 LAB — LACTATE DEHYDROGENASE: LDH: 179 U/L (ref 98–192)

## 2017-04-10 NOTE — Assessment & Plan Note (Addendum)
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 Keep  10 mg/day.  recommend folic acid [no inetarction with coumaidn]    # History of A. Fib- on coumadin [ switched from Xarelto sec to cost issues]; recent INR- 1.7; monitored by cardiology.   # check H&H/LDh monthly; follow up with me in 4 months.

## 2017-04-10 NOTE — Progress Notes (Signed)
[facility] OFFICE PROGRESS NOTE  Patient Care Team: Juline Patch, MD as PCP - General (Family Medicine) Minna Merritts, MD as Consulting Physician (Cardiology)    No history exists.   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  male pleasant patient above history of Autoimmune hemolytic anemia- currently on prednisone 10 mg/CellCept 500 twice a day [also for Autoimmune/connective tissue disorder]- is here for follow-up.   Patient denies any difficulty swallowing or pain with swallowing. Patient's appetite is good. Patient denies any worsening shortness of breath or chest pain. Denies any difficulty swallowing. No nausea no vomiting.   Patient is currently on Coumadin for his A. fib.  No blood in stools or black stools.  REVIEW OF SYSTEMS:  A complete 10 point review of system is done which is negative except mentioned above/history of present illness.   PAST MEDICAL HISTORY :  Past Medical History:  Diagnosis Date  . Anemia 10/2015  . Atrial flutter ([place])    a. on Xarelto; b. s/p successful TEE/DCCV on 08/28/2014  . Gangrene of finger ([place])   . History of nuclear stress test    a. 09/02/2014: no sig ischemia, GI uptake noted, no sig WMA, EF 48%, no EKG chanes concerning for ischemia, low risk scan    . HTN (hypertension)   . Normal coronary arteries    a. cardiac cath 12/03/2014: no sigificant CAD, right dominant system, LVEF >55%, no MR or AS  . Pulmonary fibrosis ([place])    a. not on home oxygen  . RA (rheumatoid arthritis) ([place])   . 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 echo  07/2014: EF 20-25%, cannot exclude atrial flutter,  global HK, possible bicuspid Ao valve, mod reduced RV sys fxn, mild-mod aortic valve sclerosis/calcification, mod TR, mildly elevated RVSP; b. TEE 08/28/2014: EF 30-35%, no intracardic thrombus, mildly dilated LA/RA, mild TR, mild-mod aortic sclerosis w/o stenosis; c. echo 09/2014: EF 55-60%, select images w/ inf HK    PAST SURGICAL HISTORY :   Past Surgical History:  Procedure Laterality Date  . HAND SURGERY Left     FAMILY HISTORY :   Family History  Problem Relation Age of Onset  . Leukemia Mother     SOCIAL HISTORY:   Social History   Tobacco Use  . Smoking status: Former Smoker    Packs/day: 1.00    Years: 20.00    Pack years: 20.00    Types: Cigarettes    Last attempt to quit: 08/30/2004    Years since quitting: 12.6  . Smokeless tobacco: Never Used  Substance Use Topics  . Alcohol use: No  . Drug use: No    ALLERGIES:  has No Known Allergies.  MEDICATIONS:  Current Outpatient Medications  Medication Sig Dispense Refill  . albuterol (PROVENTIL HFA;VENTOLIN HFA) 108 (90 Base) MCG/ACT inhaler Inhale 2 puffs into the lungs 4 (four) times daily as needed for wheezing or shortness of breath. 1 Inhaler 11  . dexlansoprazole (DEXILANT) 60 MG capsule Take 1 capsule (60 mg total) by mouth daily. 30 capsule 11  . diltiazem (CARDIZEM) 30 MG tablet Take 1 tablet (30 mg total) by mouth 3 (three) times daily. [address]  tablet 3  . diltiazem (DILACOR XR) 120 MG 24 hr capsule Take 1 capsule (120 mg total) by mouth daily. 90 capsule 3  . enalapril (VASOTEC) 20 MG tablet Take 1 tablet (20 mg total) by mouth daily. 90 tablet 3  . fluticasone (FLONASE) 50 MCG/ACT nasal spray Place 2 sprays into both nostrils daily. 16 g 0  . Fluticasone-Salmeterol (ADVAIR DISKUS) 250-50 MCG/DOSE AEPB INHALE 1 DOSE BY MOUTH TWICE DAILY. RINSE MOUTH AFTER USE 60 each 11  . folic acid (FOLVITE) 1 MG tablet Take 1 tablet (1 mg total) by mouth daily. 90 tablet 1  .  hydrochlorothiazide (HYDRODIURIL) 25 MG tablet TAKE 1 TABLET BY MOUTH EVERY DAY 30 tablet 3  . hydroxychloroquine (PLAQUENIL) 200 MG tablet Take 200 mg by mouth daily. Dr Delmer Islam    . meloxicam (MOBIC) 15 MG tablet Take 1 tablet (15 mg total) by mouth daily. 30 tablet 0  . metoprolol (LOPRESSOR) 50 MG tablet Take 75 mg by mouth 2 (two) times daily. Dr Delmer Islam    . mycophenolate (CELLCEPT) 500 MG tablet Take 500 mg by mouth 2 (two) times daily.  11  . pantoprazole (PROTONIX) 40 MG tablet TAKE 1 TABLET (40 MG TOTAL) BY MOUTH DAILY. 30 tablet 6  . potassium chloride SA (K-DUR,KLOR-CON) 20 MEQ tablet Take 1 tablet (20 mEq total) by mouth daily. 90 tablet 3  . predniSONE (DELTASONE) 20 MG tablet TAKE 1 TABLET (20 MG TOTAL) BY MOUTH DAILY WITH BREAKFAST. 30 tablet 0  . warfarin (COUMADIN) 5 MG tablet TAKE AS DIRECTED BY COUMADIN CLINIC 30 tablet 3   No current facility-administered medications for this visit.     PHYSICAL EXAMINATION:   BP 136/89 (BP Location: Left Arm, Patient Position: Sitting)   Pulse 78   Temp 99.1 F (37.3 C) (Tympanic)   Resp 16   Wt 253 lb 4.9 oz (114.9 kg)   BMI 36.35 kg/m   Filed Weights   04/10/17 1057  Weight: 253 lb 4.9 oz (114.9 kg)    GENERAL: Well-nourished well-developed; Alert, no distress and comfortable.  Alone.  EYES: no pallor or icterus OROPHARYNX: no thrush or ulceration; good dentition  NECK: supple, no masses felt LYMPH:  no palpable lymphadenopathy in the cervical, axillary or inguinal regions LUNGS: clear to auscultation and  No wheeze or crackles HEART/CVS: regular rate & rhythm and no murmurs; No lower extremity edema ABDOMEN:abdomen soft, non-tender and normal bowel sounds Musculoskeletal:no cyanosis of digits and no clubbing  PSYCH: alert & oriented x 3 with fluent speech NEURO: no focal motor/sensory deficits SKIN:  no rashes or significant lesions  LABORATORY DATA:  I have reviewed the data as listed    Component Value  Date/Time   NA 143 02/13/2017 1030   NA 138 12/11/2014 1518   NA 137 08/27/2014 0403   K 3.6 02/13/2017 1030   K 4.1 08/27/2014 0403   CL 105 02/13/2017 1030   CL 108 08/27/2014 0403   CO2 30 02/13/2017 1030   CO2 26 08/27/2014 0403   GLUCOSE 85 02/13/2017 1030   GLUCOSE 94 08/27/2014 0403   BUN 23 (H) 02/13/2017 1030   BUN 24 12/11/2014 1518   BUN 16 08/27/2014 0403   CREATININE 1.17 02/13/2017 1030   CREATININE 0.85 08/27/2014 0403   CALCIUM 9.0 02/13/2017 1030   CALCIUM 8.6 (L) 08/27/2014 0403   PROT 7.1 02/13/2017 1030   ALBUMIN 3.9 02/13/2017 1030   AST 25 02/13/2017 1030   ALT 19 02/13/2017 1030  ALKPHOS 42 02/13/2017 1030   BILITOT 0.6 02/13/2017 1030   GFRNONAA >60 02/13/2017 1030   GFRNONAA >60 08/27/2014 0403   GFRAA >60 02/13/2017 1030   GFRAA >60 08/27/2014 0403    No results found for: SPEP, UPEP  Lab Results  Component Value Date   WBC 11.7 (H) 02/13/2017   NEUTROABS 9.0 (H) 02/13/2017   HGB 14.0 04/10/2017   HCT 42.8 04/10/2017   MCV 85.9 02/13/2017   PLT 191 02/13/2017      Chemistry      Component Value Date/Time   NA 143 02/13/2017 1030   NA 138 12/11/2014 1518   NA 137 08/27/2014 0403   K 3.6 02/13/2017 1030   K 4.1 08/27/2014 0403   CL 105 02/13/2017 1030   CL 108 08/27/2014 0403   CO2 30 02/13/2017 1030   CO2 26 08/27/2014 0403   BUN 23 (H) 02/13/2017 1030   BUN 24 12/11/2014 1518   BUN 16 08/27/2014 0403   CREATININE 1.17 02/13/2017 1030   CREATININE 0.85 08/27/2014 0403      Component Value Date/Time   CALCIUM 9.0 02/13/2017 1030   CALCIUM 8.6 (L) 08/27/2014 0403   ALKPHOS 42 02/13/2017 1030   AST 25 02/13/2017 1030   ALT 19 02/13/2017 1030   BILITOT 0.6 02/13/2017 1030       RADIOGRAPHIC STUDIES: I have personally reviewed the radiological images as listed and agreed with the findings in the report. No results found.   ASSESSMENT & PLAN:  Secondary hemolytic anemia (HCC) # Autoimmune hemolytic anemia secondary to  connective tissue disorder/autoimmune disorder- currently on prednisone 10 mg/day;cellcept 500 BID  [rheumatology issues; 5 mg of prednisone-rheumatology]   #  Today Hb 14/ LDH- 179- normal. Keep  10 mg/day.  recommend folic acid [no inetarction with coumaidn]    # History of A. Fib- on coumadin [ switched from Xarelto sec to cost issues]; recent INR- 1.7; monitored by cardiology.   # check H&H/LDh monthly; follow up with me in 4 months.   No orders of the defined types were placed in this encounter.  All questions were answered. The patient knows to call the clinic with any problems, questions or concerns.      Cammie Sickle, MD 04/10/2017 1:22 PM

## 2017-04-11 ENCOUNTER — Ambulatory Visit (INDEPENDENT_AMBULATORY_CARE_PROVIDER_SITE_OTHER): Payer: BLUE CROSS/BLUE SHIELD

## 2017-04-11 DIAGNOSIS — I483 Typical atrial flutter: Secondary | ICD-10-CM | POA: Diagnosis not present

## 2017-04-11 DIAGNOSIS — Z5181 Encounter for therapeutic drug level monitoring: Secondary | ICD-10-CM | POA: Diagnosis not present

## 2017-04-11 LAB — POCT INR: INR: 2.6

## 2017-04-26 ENCOUNTER — Inpatient Hospital Stay: Payer: BLUE CROSS/BLUE SHIELD

## 2017-04-26 ENCOUNTER — Telehealth: Payer: Self-pay | Admitting: *Deleted

## 2017-04-26 ENCOUNTER — Telehealth: Payer: Self-pay | Admitting: Cardiovascular Disease

## 2017-04-26 DIAGNOSIS — D591 Autoimmune hemolytic anemia, unspecified: Secondary | ICD-10-CM

## 2017-04-26 DIAGNOSIS — D594 Other nonautoimmune hemolytic anemias: Secondary | ICD-10-CM | POA: Diagnosis not present

## 2017-04-26 LAB — COMPREHENSIVE METABOLIC PANEL
ALT: 29 U/L (ref 17–63)
AST: 24 U/L (ref 15–41)
Albumin: 3.9 g/dL (ref 3.5–5.0)
Alkaline Phosphatase: 50 U/L (ref 38–126)
Anion gap: 9 (ref 5–15)
BUN: 19 mg/dL (ref 6–20)
CHLORIDE: 107 mmol/L (ref 101–111)
CO2: 26 mmol/L (ref 22–32)
CREATININE: 1.08 mg/dL (ref 0.61–1.24)
Calcium: 9.1 mg/dL (ref 8.9–10.3)
GFR calc non Af Amer: >60 mL/min (ref >60)
Glucose, Bld: 102 mg/dL — ABNORMAL HIGH (ref 65–99)
POTASSIUM: 3.5 mmol/L (ref 3.5–5.1)
SODIUM: 142 mmol/L (ref 135–145)
Total Bilirubin: 0.4 mg/dL (ref 0.3–1.2)
Total Protein: 7.4 g/dL (ref 6.5–8.1)

## 2017-04-26 LAB — CBC WITH DIFFERENTIAL/PLATELET
Basophils Absolute: 0.1 10*3/uL (ref 0–0.1)
Basophils Relative: 1 %
Eosinophils Absolute: 0.1 10*3/uL (ref 0–0.7)
Eosinophils Relative: 2 %
HEMATOCRIT: 42.3 % (ref 40.0–52.0)
HEMOGLOBIN: 14 g/dL (ref 13.0–18.0)
LYMPHS ABS: 1.1 10*3/uL (ref 1.0–3.6)
LYMPHS PCT: 13 %
MCH: 27.9 pg (ref 26.0–34.0)
MCHC: 33.1 g/dL (ref 32.0–36.0)
MCV: 84.2 fL (ref 80.0–100.0)
MONOS PCT: 9 %
Monocytes Absolute: 0.7 10*3/uL (ref 0.2–1.0)
NEUTROS PCT: 75 %
Neutro Abs: 6.4 10*3/uL (ref 1.4–6.5)
Platelets: 197 10*3/uL (ref 150–440)
RBC: 5.02 MIL/uL (ref 4.40–5.90)
RDW: 14.6 % — ABNORMAL HIGH (ref 11.5–14.5)
WBC: 8.4 10*3/uL (ref 3.8–10.6)

## 2017-04-26 LAB — LACTATE DEHYDROGENASE: LDH: 165 U/L (ref 98–192)

## 2017-04-26 NOTE — Telephone Encounter (Signed)
Per Dr Rogue Bussing have patient come in for lab cjheck cbc, cmp, ldh today.

## 2017-04-26 NOTE — Telephone Encounter (Signed)
STAT if patient feels like he/she is going to faint   1) Are you dizzy now? Yes more so when standing up feels wobbly   Do you feel faint or have you passed out? No   2) Do you have any other symptoms? vom diarrhea disoriented   3) Have you checked your HR and BP (record if available)? These are ok    Patient had recent labs and these are normal too

## 2017-04-26 NOTE — Telephone Encounter (Signed)
This morning when he got up he was very lightheaded and dizzy when he stood up and had to hold onto stuff to keep from falling. He has been having diarrhea for a while off and on and he thought it could be dehydration and had some labs done. He states labs were good and he was just not sure what could be causing this. He does not have any blood pressure readings and states that he does have upcoming appointment with the GI physician. Instructed him to start monitoring his blood pressures especially when he is having those dizzy spells and to keep a log of those findings. Also advised that he change positions slowly as this can cause fluctuations in blood pressure resulting in dizziness. He verbalized understanding of our conversation, agreement with plan, and had no further questions at this time.

## 2017-04-26 NOTE — Telephone Encounter (Signed)
Wife Diane called asking if patient needs to have lab check done as he is feeling dizzy, has had diarrhea, and can't focus. She reports that he was like this when first diagnosed last year.  He just had HGB check on 11/13 and his hgb was 14. She denies any blood in his stools. I asked if she had called his PCP and she said that was useless. Please advise.

## 2017-04-27 ENCOUNTER — Ambulatory Visit
Admission: EM | Admit: 2017-04-27 | Discharge: 2017-04-27 | Discharge status: Absent without leave | Payer: BLUE CROSS/BLUE SHIELD | Source: Home / Self Care

## 2017-04-27 ENCOUNTER — Emergency Department
Admission: EM | Admit: 2017-04-27 | Discharge: 2017-04-27 | Disposition: A | Discharge status: Home / Self Care | Payer: BLUE CROSS/BLUE SHIELD | Attending: Emergency Medicine | Admitting: Emergency Medicine

## 2017-04-27 ENCOUNTER — Other Ambulatory Visit: Payer: Self-pay

## 2017-04-27 DIAGNOSIS — R197 Diarrhea, unspecified: Secondary | ICD-10-CM | POA: Insufficient documentation

## 2017-04-27 DIAGNOSIS — Z7901 Long term (current) use of anticoagulants: Secondary | ICD-10-CM | POA: Insufficient documentation

## 2017-04-27 DIAGNOSIS — R42 Dizziness and giddiness: Secondary | ICD-10-CM | POA: Diagnosis present

## 2017-04-27 DIAGNOSIS — R1110 Vomiting, unspecified: Secondary | ICD-10-CM | POA: Insufficient documentation

## 2017-04-27 DIAGNOSIS — M069 Rheumatoid arthritis, unspecified: Secondary | ICD-10-CM | POA: Diagnosis not present

## 2017-04-27 DIAGNOSIS — Z8673 Personal history of transient ischemic attack (TIA), and cerebral infarction without residual deficits: Secondary | ICD-10-CM | POA: Insufficient documentation

## 2017-04-27 DIAGNOSIS — Z79899 Other long term (current) drug therapy: Secondary | ICD-10-CM | POA: Insufficient documentation

## 2017-04-27 DIAGNOSIS — I10 Essential (primary) hypertension: Secondary | ICD-10-CM | POA: Diagnosis not present

## 2017-04-27 DIAGNOSIS — Z87891 Personal history of nicotine dependence: Secondary | ICD-10-CM | POA: Diagnosis not present

## 2017-04-27 LAB — CBC
HCT: 43 % (ref 40.0–52.0)
Hemoglobin: 14.1 g/dL (ref 13.0–18.0)
MCH: 28 pg (ref 26.0–34.0)
MCHC: 32.7 g/dL (ref 32.0–36.0)
MCV: 85.7 fL (ref 80.0–100.0)
PLATELETS: 204 10*3/uL (ref 150–440)
RBC: 5.01 MIL/uL (ref 4.40–5.90)
RDW: 15.2 % — AB (ref 11.5–14.5)
WBC: 9.3 10*3/uL (ref 3.8–10.6)

## 2017-04-27 LAB — URINALYSIS, COMPLETE (UACMP) WITH MICROSCOPIC
Bacteria, UA: NONE SEEN
Bilirubin Urine: NEGATIVE
Glucose, UA: NEGATIVE mg/dL
Hgb urine dipstick: NEGATIVE
Ketones, ur: NEGATIVE mg/dL
LEUKOCYTES UA: NEGATIVE
Nitrite: NEGATIVE
Protein, ur: NEGATIVE mg/dL
RBC / HPF: NONE SEEN RBC/hpf (ref 0–5)
SPECIFIC GRAVITY, URINE: 1.017 (ref 1.005–1.030)
SQUAMOUS EPITHELIAL / LPF: NONE SEEN
pH: 5 (ref 5.0–8.0)

## 2017-04-27 LAB — BASIC METABOLIC PANEL
Anion gap: 11 (ref 5–15)
BUN: 17 mg/dL (ref 6–20)
CHLORIDE: 106 mmol/L (ref 101–111)
CO2: 25 mmol/L (ref 22–32)
CREATININE: 1.04 mg/dL (ref 0.61–1.24)
Calcium: 9.3 mg/dL (ref 8.9–10.3)
GFR calc Af Amer: >60 mL/min (ref >60)
GFR calc non Af Amer: >60 mL/min (ref >60)
Glucose, Bld: 91 mg/dL (ref 65–99)
Potassium: 3.6 mmol/L (ref 3.5–5.1)
SODIUM: 142 mmol/L (ref 135–145)

## 2017-04-27 LAB — APTT: aPTT: 33 seconds (ref 24–36)

## 2017-04-27 LAB — PROTIME-INR
INR: 1.89
PROTHROMBIN TIME: 21.5 s — AB (ref 11.4–15.2)

## 2017-04-27 MED ORDER — MECLIZINE HCL 25 MG PO TABS
25.0000 mg | ORAL_TABLET | Freq: Once | ORAL | Status: AC
  Administered 2017-04-27: 25 mg via ORAL
  Filled 2017-04-27: qty 1

## 2017-04-27 MED ORDER — MECLIZINE HCL 25 MG PO TABS: 25.0000 mg | ORAL_TABLET | Freq: Three times a day (TID) | ORAL | 0 refills | Status: DC | PRN

## 2017-04-27 NOTE — Discharge Instructions (Signed)
You are evaluated for room spinning/dizziness, and as we discussed her exam in evaluating her overall reassuring in the emergency department, and I am most suspicious of vertigo.  You are being prescribed meclizine to help with symptoms.  We discussed whether or not to do a head CT today, chose to hold off.  If you have any new or worsening symptoms including any slurred speech, numbness, weakness, confusion or altered mental status, or any other symptoms concerning to you, please return to emergency department immediately.  Please help with your primary care doctor in about 1 week, if you are having any ongoing symptoms, I suspect you may be recommended to have head imaging, and possibly referral to ENT for persistent vertigo.

## 2017-04-27 NOTE — ED Provider Notes (Signed)
[facility] Emergency Department Provider Note ____________________________________________   I have reviewed the triage vital signs and the triage nursing note.  HISTORY  Chief Complaint Dizziness; Diarrhea; and Emesis   Historian Patient  HPI [name] is a 57 y.o. male presents for evaluation of symptoms of room spinning.  Patient states that he woke up yesterday morning and sat upright in bed and felt a sensation of room spinning and motion sickness.  He felt some nausea.  This happened several other times throughout the day.  He felt like he was "drunk "and called his primary doctor to be checked for his red blood cells.  States that he had blood work done yesterday and was told it was normal.  He is never had symptoms like this before.  He is having no headache, vision changes, slurred speech, facial droop, weakness or numbness, dizziness passing out, and there is no trauma.  This morning he also had an episode where he felt the spinning sensation.   Past Medical History:  Diagnosis Date  . Anemia 10/2015  . Atrial flutter ([place])    a. on Xarelto; b. s/p successful TEE/DCCV on 08/28/2014  . Gangrene of finger ([place])   . History of nuclear stress test    a. 09/02/2014: no sig ischemia, GI uptake noted, no sig WMA, EF 48%, no EKG chanes concerning for ischemia, low risk scan    . HTN (hypertension)   . Normal coronary arteries    a. cardiac cath 12/03/2014: no sigificant CAD, right dominant system, LVEF >55%, no MR or AS  . Pulmonary fibrosis ([place])    a. not on home oxygen  . RA (rheumatoid arthritis) ([place])   . 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 echo 07/2014: EF 20-25%, cannot exclude atrial flutter,  global HK, possible bicuspid Ao valve, mod reduced RV sys fxn, mild-mod aortic valve sclerosis/calcification, mod TR, mildly elevated RVSP; b. TEE 08/28/2014: EF 30-35%, no intracardic thrombus, mildly dilated LA/RA, mild TR, mild-mod aortic  sclerosis w/o stenosis; c. echo 09/2014: EF 55-60%, select images w/ inf HK    Patient Active Problem List   Diagnosis Date Noted  . Encounter for therapeutic drug monitoring 09/15/2016  . Encounter for anticoagulation discussion and counseling 08/29/2016  . Carpal tunnel syndrome 08/08/2016  . Degenerative disc disease 08/08/2016  . Cerebrovascular accident (CVA) ([place]) 07/08/2016  . Trigeminal neuralgia 07/08/2016  . 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 Ischemia of hand 04/19/2016  . Primary hypercoagulable state ([place]) 04/19/2016  . Sarcoidosis 04/19/2016  . Gangrene of finger ([place]) 04/03/2016  . Vasculitis ([place]) 04/03/2016  . Shortness of breath 12/31/2015  . 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 Hemorrhagic shock ([place]) 11/25/2015  . Arrhythmia 11/25/2015  . Absolute anemia   . Weakness   . Hemolytic anemia ([place])   . Iron deficiency anemia, unspecified   . Hernia, hiatal   . Acute esophagitis   . 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 Normal coronary arteries   . Chest pain 12/01/2014  . Typical atrial flutter ([place])   . 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 Essential hypertension   . RA (rheumatoid arthritis) ([place])   . Pulmonary fibrosis ([place])   . Atrial flutter ([place])   . Nontoxic uninodular goiter 04/20/2014  . Mixed connective tissue disease ([place]) 02/03/2014  . Mediastinal adenopathy 11/02/2013    Past Surgical History:  Procedure Laterality Date  . CARDIAC CATHETERIZATION N/A 12/03/2014   Procedure: Left Heart Cath and Coronary Angiography;  Surgeon: Minna Merritts, MD;  Location: [place] [facility];  Service: Cardiovascular;  Laterality: N/A;  . COLONOSCOPY WITH PROPOFOL N/A 11/23/2015   Procedure: COLONOSCOPY WITH PROPOFOL;  Surgeon: [name], MD;  Location: [facility] ENDOSCOPY;  Service: Endoscopy;  Laterality: N/A;  . ESOPHAGOGASTRODUODENOSCOPY (EGD) WITH PROPOFOL N/A 11/23/2015   Procedure: ESOPHAGOGASTRODUODENOSCOPY (EGD) WITH PROPOFOL;  Surgeon:  [name], MD;  Location: [facility] ENDOSCOPY;  Service: Endoscopy;  Laterality: N/A;  . HAND SURGERY Left   . PERIPHERAL VASCULAR CATHETERIZATION N/A 11/17/2015   Procedure: Upper Extremity Angiography;  Surgeon: Wellington Hampshire, MD;  Location: [place] [facility];  Service: Cardiovascular;  Laterality: N/A;    Prior to Admission medications   Medication Sig Start Date End Date Taking? Authorizing Provider  dexlansoprazole (DEXILANT) 60 MG capsule Take 1 capsule (60 mg total) by mouth daily. 08/22/16  Yes [name], MD  diltiazem (DILACOR XR) 120 MG 24 hr capsule Take 1 capsule (120 mg total) by mouth daily. 03/29/17  Yes Minna Merritts, MD  enalapril (VASOTEC) 20 MG tablet Take 1 tablet (20 mg total) by mouth daily. 09/19/16  Yes Minna Merritts, MD  folic acid (FOLVITE) 1 MG tablet Take 1 tablet (1 mg total) by mouth daily. 02/13/17  Yes Cammie Sickle, MD  hydrochlorothiazide (HYDRODIURIL) 25 MG tablet TAKE 1 TABLET BY MOUTH EVERY DAY 03/26/17  Yes Gollan, Kathlene November, MD  hydroxychloroquine (PLAQUENIL) 200 MG tablet Take 400 mg by mouth 2 (two) times daily. Dr Delmer Islam 12/27/15  Yes [provider]  meloxicam (MOBIC) 15 MG tablet Take 1 tablet (15 mg total) by mouth daily. 06/18/15  Yes Stover, Titorya, DPM  metoprolol (LOPRESSOR) 50 MG tablet Take 75 mg by mouth 2 (two) times daily. Dr Delmer Islam   Yes [provider]  mycophenolate (CELLCEPT) 500 MG tablet Take 1,000 mg by mouth 2 (two) times daily.  02/14/16  Yes [provider]  pantoprazole (PROTONIX) 40 MG tablet TAKE 1 TABLET (40 MG TOTAL) BY MOUTH DAILY. 01/09/17  Yes [name], MD  potassium chloride SA (K-DUR,KLOR-CON) 20 MEQ tablet Take 1 tablet (20 mEq total) by mouth daily. 02/07/17  Yes Gollan, Kathlene November, MD  predniSONE (DELTASONE) 20 MG tablet TAKE 1 TABLET (20 MG TOTAL) BY MOUTH DAILY WITH BREAKFAST. Patient taking differently: Take 10 mg by mouth daily with breakfast.  02/21/16  Yes Cammie Sickle, MD  warfarin (COUMADIN) 5 MG tablet TAKE AS DIRECTED BY COUMADIN CLINIC Patient taking differently: take 1 tablet daily 04/05/17  Yes Gollan, Kathlene November, MD  albuterol (PROVENTIL HFA;VENTOLIN HFA) 108 (90 Base) MCG/ACT inhaler Inhale 2 puffs into the lungs 4 (four) times daily as needed for wheezing or shortness of breath. 03/17/16   Juline Patch, MD  diltiazem (CARDIZEM) 30 MG tablet Take 1 tablet (30 mg total) by mouth 3 (three) times daily. Patient not taking: Reported on 04/27/2017 09/11/16   Minna Merritts, MD  fluticasone [facility]) 50 MCG/ACT nasal spray Place 2 sprays into both nostrils daily. 06/10/15   Lorin Picket, PA-C  Fluticasone-Salmeterol (ADVAIR DISKUS) 250-50 MCG/DOSE AEPB INHALE 1 DOSE BY MOUTH TWICE DAILY. RINSE MOUTH AFTER USE 03/17/16   Juline Patch, MD  meclizine (ANTIVERT) 25 MG tablet Take 1 tablet (25 mg total) by mouth 3 (three) times daily as needed for dizziness or nausea. 04/27/17   Lisa Roca, MD    No Known Allergies  Family History  Problem Relation Age of Onset  . Leukemia Mother  Social History Social History   Tobacco Use  . Smoking status: Former Smoker    Packs/day: 1.00    Years: 20.00    Pack years: 20.00    Types: Cigarettes    Last attempt to quit: 08/30/2004    Years since quitting: 12.6  . Smokeless tobacco: Never Used  Substance Use Topics  . Alcohol use: No  . Drug use: No    Review of Systems  Constitutional: Negative for fever. Eyes: Negative for visual changes. ENT: Negative for sore throat. Cardiovascular: Negative for chest pain. Respiratory: Negative for shortness of breath. Gastrointestinal: Negative for abdominal pain, vomiting and diarrhea. Genitourinary: Negative for dysuria. Musculoskeletal: Negative for back pain. Skin: Negative for rash. Neurological: Negative for headache.  ____________________________________________   PHYSICAL EXAM:  VITAL SIGNS: ED Triage Vitals  Enc Vitals  Group     BP 04/27/17 1121 129/86     Pulse Rate 04/27/17 1124 74     Resp 04/27/17 1121 18     Temp 04/27/17 1121 98.1 F (36.7 C)     Temp Source 04/27/17 1121 Oral     SpO2 04/27/17 1124 100 %     Weight 04/27/17 1122 245 lb (111.1 kg)     Height 04/27/17 1122 5\' 10"  (1.778 m)     Head Circumference --      Peak Flow --      Pain Score 04/27/17 1121 0     Pain Loc --      Pain Edu? --      Excl. in [place]? --      Constitutional: Alert and oriented. Well appearing and in no distress. HEENT   Head: Normocephalic and atraumatic.      Eyes: Conjunctivae are normal. Pupils equal and round.  Conjugate gaze, right eye with normal eye movement.      Ears:         Nose: No congestion/rhinnorhea.   Mouth/Throat: Mucous membranes are moist.   Neck: No stridor.   Cardiovascular/Chest: Normal rate, regular rhythm.  No murmurs, rubs, or gallops. Respiratory: Normal respiratory effort without tachypnea nor retractions. Breath sounds are clear and equal bilaterally. No wheezes/rales/rhonchi. Gastrointestinal: Soft. No distention, no guarding, no rebound. Nontender.    Genitourinary/rectal:Deferred Musculoskeletal: Nontender with normal range of motion in all extremities. No joint effusions.  No lower extremity tenderness.  No edema. Neurologic: No facial droop.  Cranial nerves II through X intact.  Normal speech and language. No gross or focal neurologic deficits are appreciated.  Finger-nose intact bilaterally.  Heel-to-shin intact bilaterally. Skin:  Skin is warm, dry and intact. No rash noted. Psychiatric: Mood and affect are normal. Speech and behavior are normal. Patient exhibits appropriate insight and judgment.   ____________________________________________  LABS (pertinent positives/negatives) I, Lisa Roca, MD the attending physician have reviewed the labs noted below.  Labs Reviewed  CBC - Abnormal; Notable for the following components:      Result Value   RDW 15.2  (*)    All other components within normal limits  URINALYSIS, COMPLETE (UACMP) WITH MICROSCOPIC - Abnormal; Notable for the following components:   Color, Urine YELLOW (*)    APPearance CLEAR (*)    All other components within normal limits  PROTIME-INR - Abnormal; Notable for the following components:   Prothrombin Time 21.5 (*)    All other components within normal limits  BASIC METABOLIC PANEL  APTT  CBG MONITORING, ED    ____________________________________________    EKG I, Lisa Roca, MD,  the attending physician have personally viewed and interpreted all ECGs.  76 bpm.  Normal sinus rhythm.  Left axis deviation.  Occasional PVC.  Nonspecific ST and T wave ____________________________________________  RADIOLOGY All Xrays were viewed by me.  Imaging interpreted by Radiologist, and I, Lisa Roca, MD the attending physician have reviewed the radiologist interpretation noted below.  None __________________________________________  PROCEDURES  Procedure(s) performed: None  Critical Care performed: None   ____________________________________________  ED COURSE / ASSESSMENT AND PLAN  Pertinent labs & imaging results that were available during my care of the patient were reviewed by me and considered in my medical decision making (see chart for details).    Weekly symptoms seems most consistent with peripheral vertigo.  Neurologic exam is intact.  Symptoms seem to be very peripheral, worse with change of position.  We discussed whether or not to obtain CT imaging, and discussed risk and benefit and chose to hold off at this point in time which is acting completely reasonable.  He is going to be treated for presumptive peripheral vertigo with symptomatic medication meclizine.  He understands return precautions with respect to any neurologic complaints or worsening.  He can follow with his primary care doctor within 1 week.  Laboratory studies are reassuring without  electrolyte disturbance, new anemia, etc.  Orthostatics are reassuring.  Patient did also describe to me that he has been having some chronic diarrhea for over a month now, and has an appointment with GI for Tuesday.  DIFFERENTIAL DIAGNOSIS: Including but not limited to anemia, electrolyte disturbance, dehydration, orthostatic hypotension, vertigo, stroke, intracranial emergency such as tumor, etc.  CONSULTATIONS:   None   Patient / Family / Caregiver informed of clinical course, medical decision-making process, and agree with plan.   I discussed return precautions, follow-up instructions, and discharge instructions with patient and/or family.  Discharge Instructions : You are evaluated for room spinning/dizziness, and as we discussed her exam in evaluating her overall reassuring in the emergency department, and I am most suspicious of vertigo.  You are being prescribed meclizine to help with symptoms.  We discussed whether or not to do a head CT today, chose to hold off.  If you have any new or worsening symptoms including any slurred speech, numbness, weakness, confusion or altered mental status, or any other symptoms concerning to you, please return to emergency department immediately.  Please help with your primary care doctor in about 1 week, if you are having any ongoing symptoms, I suspect you may be recommended to have head imaging, and possibly referral to ENT for persistent vertigo.    ___________________________________________   FINAL CLINICAL IMPRESSION(S) / ED DIAGNOSES   Final diagnoses:  Vertigo      ___________________________________________        Note: This dictation was prepared with Dragon dictation. Any transcriptional errors that result from this process are unintentional    Lisa Roca, MD 04/27/17 1348

## 2017-04-27 NOTE — ED Notes (Signed)
Dr. Lord at bedside.  

## 2017-04-27 NOTE — ED Triage Notes (Signed)
Pt was sent from [facility] with c/o diarrhea for the past 2-3 weeks with vomiting and dizziness today. Denies any pain..states he has an apt with GI about the diarrhea on 12/4

## 2017-04-30 NOTE — Telephone Encounter (Signed)
Would consider holding the HCTZ for now especially if he has diarrhea

## 2017-05-01 ENCOUNTER — Encounter: Payer: Self-pay | Admitting: *Deleted

## 2017-05-01 ENCOUNTER — Encounter: Payer: Self-pay | Admitting: Gastroenterology

## 2017-05-01 ENCOUNTER — Other Ambulatory Visit: Payer: Self-pay

## 2017-05-01 ENCOUNTER — Ambulatory Visit (INDEPENDENT_AMBULATORY_CARE_PROVIDER_SITE_OTHER): Payer: BLUE CROSS/BLUE SHIELD | Admitting: Gastroenterology

## 2017-05-01 VITALS — BP 128/84 | HR 75 | Temp 98.5°F | Ht 70.0 in | Wt 247.0 lb

## 2017-05-01 DIAGNOSIS — R197 Diarrhea, unspecified: Secondary | ICD-10-CM

## 2017-05-01 NOTE — Progress Notes (Signed)
Primary Care Physician: Juline Patch, MD  Primary Gastroenterologist:  Dr. [name]  Chief Complaint  Patient presents with  . Diarrhea    HPI: [name] is a 56 y.o. male here because of diarrhea. The patient reports the diarrhea to have been present for the last month.  The patient states that the diarrhea has woken him up from his sleep.  The patient also reports that he is lost approximately 8 pounds since the diarrhea started.  There is no black stools or bloody stools.  He reports that he did not sleep well last night because he kept having to get up to have the watery diarrhea. The patient had a colonoscopy in June 2017 for anemia without any signs of a cause for his diarrhea. The patient denies any fevers or chills associated with the diarrhea and denies being on antibiotics recently.  Current Outpatient Medications  Medication Sig Dispense Refill  . albuterol (PROVENTIL HFA;VENTOLIN HFA) 108 (90 Base) MCG/ACT inhaler Inhale 2 puffs into the lungs 4 (four) times daily as needed for wheezing or shortness of breath. 1 Inhaler 11  . dexlansoprazole (DEXILANT) 60 MG capsule Take 1 capsule (60 mg total) by mouth daily. (Patient not taking: Reported on 05/01/2017) 30 capsule 11  . diltiazem (DILACOR XR) 120 MG 24 hr capsule Take 1 capsule (120 mg total) by mouth daily. 90 capsule 3  . enalapril (VASOTEC) 20 MG tablet Take 1 tablet (20 mg total) by mouth daily. 90 tablet 3  . fluticasone (FLONASE) 50 MCG/ACT nasal spray Place 2 sprays into both nostrils daily. 16 g 0  . Fluticasone-Salmeterol (ADVAIR DISKUS) 250-50 MCG/DOSE AEPB INHALE 1 DOSE BY MOUTH TWICE DAILY. RINSE MOUTH AFTER USE 60 each 11  . folic acid (FOLVITE) 1 MG tablet Take 1 tablet (1 mg total) by mouth daily. 90 tablet 1  . hydrochlorothiazide (HYDRODIURIL) 25 MG tablet TAKE 1 TABLET BY MOUTH EVERY DAY 30 tablet 3  . hydroxychloroquine (PLAQUENIL) 200 MG tablet Take 400 mg by mouth 2 (two) times daily. Dr  Delmer Islam    . meclizine (ANTIVERT) 25 MG tablet Take 1 tablet (25 mg total) by mouth 3 (three) times daily as needed for dizziness or nausea. 20 tablet 0  . meloxicam (MOBIC) 15 MG tablet Take 1 tablet (15 mg total) by mouth daily. 30 tablet 0  . metoprolol (LOPRESSOR) 50 MG tablet Take 75 mg by mouth 2 (two) times daily. Dr Delmer Islam    . mycophenolate (CELLCEPT) 500 MG tablet Take 1,000 mg by mouth 2 (two) times daily.   11  . pantoprazole (PROTONIX) 40 MG tablet TAKE 1 TABLET (40 MG TOTAL) BY MOUTH DAILY. 30 tablet 6  . potassium chloride SA (K-DUR,KLOR-CON) 20 MEQ tablet Take 1 tablet (20 mEq total) by mouth daily. 90 tablet 3  . predniSONE (DELTASONE) 20 MG tablet TAKE 1 TABLET (20 MG TOTAL) BY MOUTH DAILY WITH BREAKFAST. (Patient taking differently: Take 10 mg by mouth daily with breakfast. ) 30 tablet 0  . warfarin (COUMADIN) 5 MG tablet TAKE AS DIRECTED BY COUMADIN CLINIC (Patient taking differently: take 1 tablet daily) 30 tablet 3  . diltiazem (CARDIZEM) 30 MG tablet Take 1 tablet (30 mg total) by mouth 3 (three) times daily. (Patient not taking: Reported on 04/27/2017) 90 tablet 3   No current facility-administered medications for this visit.     Allergies as of 05/01/2017  . (No Known Allergies)    ROS:  General: Negative for anorexia, weight  loss, fever, chills, fatigue, weakness. ENT: Negative for hoarseness, difficulty swallowing , nasal congestion. CV: Negative for chest pain, angina, palpitations, dyspnea on exertion, peripheral edema.  Respiratory: Negative for dyspnea at rest, dyspnea on exertion, cough, sputum, wheezing.  GI: See history of present illness. GU:  Negative for dysuria, hematuria, urinary incontinence, urinary frequency, nocturnal urination.  Endo: Negative for unusual weight change.    Physical Examination:   BP 128/84 (BP Location: Right Arm, Patient Position: Sitting, Cuff Size: Large)   Pulse 75   Temp 98.5 F (36.9 C) (Oral)   Ht 5\' 10"  (1.778 m)    Wt 247 lb (112 kg)   BMI 35.44 kg/m   General: Well-nourished, well-developed in no acute distress.  Eyes: No icterus. Conjunctivae pink. Mouth: Oropharyngeal mucosa moist and pink , no lesions erythema or exudate. Lungs: Clear to auscultation bilaterally. Non-labored. Heart: Regular rate and rhythm, no murmurs rubs or gallops.  Abdomen: Bowel sounds are normal, nontender, nondistended, no hepatosplenomegaly or masses, no abdominal bruits or hernia , no rebound or guarding.   Extremities: No lower extremity edema. No clubbing or deformities. Neuro: Alert and oriented x 3.  Grossly intact. Skin: Warm and dry, no jaundice.   Psych: Alert and cooperative, normal mood and affect.  Labs:    Imaging Studies: No results found.  Assessment and Plan:   [name] is a 56 y.o. y/o male Who has had diarrhea with weight loss and having to get up in the middle the night to go to bathroom.  The patient may be suffering from microscopic colitis versus lactose intolerance.  The patient states he drinks Lactaid milk but does have cheese in his diet  and cream in his coffee. The patient will be set up for colonoscopy due to his worry symptoms of waking up in the middle night from the diarrhea and his weight loss.  The patient has been explained the plan and agrees with it.    [name], MD. [place] [place]al   Note: This dictation was prepared with Dragon dictation along with smaller phrase technology. Any transcriptional errors that result from this process are unintentional.

## 2017-05-01 NOTE — Telephone Encounter (Signed)
Left voicemail message to call back  

## 2017-05-01 NOTE — Telephone Encounter (Signed)
Spoke with patient and he states that his blood pressures have been great and that he was diagnosed with inner ear problems and was placed on medications for that which has helped. He reports that he has appointment today with GI physician to address his diarrhea. Advised to continue current medications and to call back if he continues to have problems. He verbalized understanding with no further questions at this time.

## 2017-05-02 ENCOUNTER — Telehealth: Payer: Self-pay | Admitting: Gastroenterology

## 2017-05-02 NOTE — Telephone Encounter (Signed)
Pt has decided to keep procedure for Friday, 05/04/17.

## 2017-05-02 NOTE — Anesthesia Preprocedure Evaluation (Addendum)
Anesthesia Evaluation  Patient identified by MRN, date of birth, ID band Patient awake    Reviewed: Allergy & Precautions, H&P , NPO status , Patient's Chart, lab work & pertinent test results  Airway Mallampati: II  TM Distance: >3 FB Neck ROM: full    Dental no notable dental hx.    Pulmonary shortness of breath and Long-Term Oxygen Therapy, former smoker,  Pulmonary fibrosis on home O2   Pulmonary exam normal breath sounds clear to auscultation       Cardiovascular hypertension, Normal cardiovascular exam+ dysrhythmias (a flutter on Xarelto; frequent PVCs)  Rhythm:regular Rate:Normal  Cardiomyopathy    Neuro/Psych    GI/Hepatic GERD  ,  Endo/Other    Renal/GU      Musculoskeletal  (+) Arthritis , Rheumatoid disorders,    Abdominal   Peds  Hematology  (+) Blood dyscrasia, anemia ,   Anesthesia Other Findings From cardiology note 02/07/17:   56 year old male with history of  atrial flutter s/p TEE/DCCV in April 2016 on Xarelto,  tachycardia-induced cardiomyopathy with EF as low as 30-35% improved to 55-60% on follow up echo,  pulmonary fibrosis on home oxygen all the time,  HTN,  rheumatoid arthritis, admission to [facility] from 7/5-12/03/14 cardiac catheterization For chest pain showing no significant CAD,  Left upper extremity angiogram 11/17/2015 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 Raul Del  Reports blood pressures  well controlled on current medications  Lab work reviewed with him, previous potassium 3.1 Used to be on potassium pills, does not know why he is not on a pill at this time Denies any muscle cramping  If he drinks [employer] coffee gets palpitations Once every 2 months left anterior fascicular block  EKG personally reviewed by myself on todays visit Shows normal sinus rhythm with rate 84 bpm  Assessment/Plan Essential hypertension - Plan: EKG 12-Lead Blood pressure is well controlled on today's visit. No changes made to the medications.  Frequent PVCs - Plan: EKG 12-Lead continue beta blockers, currently asymptomatic  Shortness of breath - Plan: EKG 12-Lead underlying lung disease, followed by pulmonary Fibrosis, followed by pulmonary, now on oxygen  Typical atrial flutter (HCC) amiodarone stopped secondary to underlying lung disease Tolerating warfarin Stay on diltiazem, maintaining normal sinus rhythm  Pulmonary fibrosis (HCC) Currently on oxygen by nasal cannula,  Followed by Dr. Raul Del, slight progression  Encounter for anticoagulation discussion and counseling Tolerating anticoagulation, no bruising or bleeding Prior history of anemia Stable, no recent bleeding  Disposition:   F/U  12 months  Signed, Esmond Plants, M.D., Ph.D. 02/07/2017  [place] [phone]   Reproductive/Obstetrics                            Anesthesia Physical Anesthesia Plan  ASA: IV  Anesthesia Plan: General   Post-op Pain Management:    Induction: Intravenous  PONV Risk Score and Plan: 2 and Propofol infusion  Airway Management Planned: Natural Airway  Additional Equipment:   Intra-op Plan:  Post-operative Plan:   Informed Consent: I have reviewed the patients History and Physical, chart, labs and discussed the procedure including the risks, benefits and alternatives for the proposed anesthesia with the  patient or authorized representative who has indicated his/her understanding and acceptance.     Plan Discussed with: CRNA  Anesthesia Plan Comments:        Anesthesia Quick Evaluation

## 2017-05-02 NOTE — Telephone Encounter (Signed)
Patient needs to reschedule his colonoscopy due to no ride and would like to reschedule for next week. Please call

## 2017-05-03 NOTE — Discharge Instructions (Signed)
General Anesthesia, Adult, Care After °These instructions provide you with information about caring for yourself after your procedure. Your health care provider may also give you more specific instructions. Your treatment has been planned according to current medical practices, but problems sometimes occur. Call your health care provider if you have any problems or questions after your procedure. °What can I expect after the procedure? °After the procedure, it is common to have: °· Vomiting. °· A sore throat. °· Mental slowness. ° °It is common to feel: °· Nauseous. °· Cold or shivery. °· Sleepy. °· Tired. °· Sore or achy, even in parts of your body where you did not have surgery. ° °Follow these instructions at home: °For at least 24 hours after the procedure: °· Do not: °? Participate in activities where you could fall or become injured. °? Drive. °? Use heavy machinery. °? Drink alcohol. °? Take sleeping pills or medicines that cause drowsiness. °? Make important decisions or sign legal documents. °? Take care of children on your own. °· Rest. °Eating and drinking °· If you vomit, drink water, juice, or soup when you can drink without vomiting. °· Drink enough fluid to keep your urine clear or pale yellow. °· Make sure you have little or no nausea before eating solid foods. °· Follow the diet recommended by your health care provider. °General instructions °· Have a responsible adult stay with you until you are awake and alert. °· Return to your normal activities as told by your health care provider. Ask your health care provider what activities are safe for you. °· Take over-the-counter and prescription medicines only as told by your health care provider. °· If you smoke, do not smoke without supervision. °· Keep all follow-up visits as told by your health care provider. This is important. °Contact a health care provider if: °· You continue to have nausea or vomiting at home, and medicines are not helpful. °· You  cannot drink fluids or start eating again. °· You cannot urinate after 8-12 hours. °· You develop a skin rash. °· You have fever. °· You have increasing redness at the site of your procedure. °Get help right away if: °· You have difficulty breathing. °· You have chest pain. °· You have unexpected bleeding. °· You feel that you are having a life-threatening or urgent problem. °This information is not intended to replace advice given to you by your health care provider. Make sure you discuss any questions you have with your health care provider. °Document Released: 08/21/2000 Document Revised: 10/18/2015 Document Reviewed: 04/29/2015 °Elsevier Interactive Patient Education © 2018 [employer] ° °

## 2017-05-04 ENCOUNTER — Encounter
Admission: RE | Disposition: A | Discharge status: Home / Self Care | Payer: Self-pay | Source: Ambulatory Visit | Attending: Gastroenterology

## 2017-05-04 ENCOUNTER — Ambulatory Visit
Admission: RE | Admit: 2017-05-04 | Discharge: 2017-05-04 | Disposition: A | Discharge status: Home / Self Care | Payer: BLUE CROSS/BLUE SHIELD | Source: Ambulatory Visit | Attending: Gastroenterology | Admitting: Gastroenterology

## 2017-05-04 ENCOUNTER — Ambulatory Visit: Payer: BLUE CROSS/BLUE SHIELD | Admitting: Anesthesiology

## 2017-05-04 DIAGNOSIS — K219 Gastro-esophageal reflux disease without esophagitis: Secondary | ICD-10-CM | POA: Diagnosis not present

## 2017-05-04 DIAGNOSIS — Z7901 Long term (current) use of anticoagulants: Secondary | ICD-10-CM | POA: Diagnosis not present

## 2017-05-04 DIAGNOSIS — D125 Benign neoplasm of sigmoid colon: Secondary | ICD-10-CM

## 2017-05-04 DIAGNOSIS — I4892 Unspecified atrial flutter: Secondary | ICD-10-CM | POA: Diagnosis not present

## 2017-05-04 DIAGNOSIS — K640 First degree hemorrhoids: Secondary | ICD-10-CM | POA: Diagnosis not present

## 2017-05-04 DIAGNOSIS — Z79899 Other long term (current) drug therapy: Secondary | ICD-10-CM | POA: Diagnosis not present

## 2017-05-04 DIAGNOSIS — K529 Noninfective gastroenteritis and colitis, unspecified: Secondary | ICD-10-CM | POA: Insufficient documentation

## 2017-05-04 DIAGNOSIS — Z87891 Personal history of nicotine dependence: Secondary | ICD-10-CM | POA: Insufficient documentation

## 2017-05-04 DIAGNOSIS — I10 Essential (primary) hypertension: Secondary | ICD-10-CM | POA: Insufficient documentation

## 2017-05-04 DIAGNOSIS — M069 Rheumatoid arthritis, unspecified: Secondary | ICD-10-CM | POA: Diagnosis not present

## 2017-05-04 DIAGNOSIS — K635 Polyp of colon: Secondary | ICD-10-CM | POA: Diagnosis not present

## 2017-05-04 DIAGNOSIS — K5730 Diverticulosis of large intestine without perforation or abscess without bleeding: Secondary | ICD-10-CM | POA: Diagnosis not present

## 2017-05-04 HISTORY — PX: POLYPECTOMY: SHX5525

## 2017-05-04 HISTORY — DX: Gastro-esophageal reflux disease without esophagitis: K21.9

## 2017-05-04 HISTORY — PX: COLONOSCOPY WITH PROPOFOL: SHX5780

## 2017-05-04 SURGERY — COLONOSCOPY WITH PROPOFOL
Anesthesia: General | Wound class: Clean Contaminated

## 2017-05-04 MED ORDER — LIDOCAINE HCL (CARDIAC) 20 MG/ML IV SOLN
INTRAVENOUS | Status: DC | PRN
  Administered 2017-05-04: 40 mg via INTRAVENOUS

## 2017-05-04 MED ORDER — LACTATED RINGERS IV SOLN
INTRAVENOUS | Status: DC
  Administered 2017-05-04: 09:00:00 via INTRAVENOUS

## 2017-05-04 MED ORDER — PROPOFOL 10 MG/ML IV BOLUS
INTRAVENOUS | Status: DC | PRN
  Administered 2017-05-04: 30 mg via INTRAVENOUS
  Administered 2017-05-04: 80 mg via INTRAVENOUS
  Administered 2017-05-04: 20 mg via INTRAVENOUS
  Administered 2017-05-04: 30 mg via INTRAVENOUS
  Administered 2017-05-04: 20 mg via INTRAVENOUS

## 2017-05-04 MED ORDER — ACETAMINOPHEN 160 MG/5ML PO SOLN: 325.0000 mg | Freq: Once | ORAL | Status: DC

## 2017-05-04 MED ORDER — ACETAMINOPHEN 325 MG PO TABS: 325.0000 mg | ORAL_TABLET | Freq: Once | ORAL | Status: DC

## 2017-05-04 SURGICAL SUPPLY — 23 items

## 2017-05-04 NOTE — Transfer of Care (Signed)
Immediate Anesthesia Transfer of Care Note  Patient: [name]  Procedure(s) Performed: COLONOSCOPY WITH PROPOFOL (N/A )  Patient Location: PACU  Anesthesia Type: General  Level of Consciousness: awake, alert  and patient cooperative  Airway and Oxygen Therapy: Patient Spontanous Breathing and Patient connected to supplemental oxygen  Post-op Assessment: Post-op Vital signs reviewed, Patient's Cardiovascular Status Stable, Respiratory Function Stable, Patent Airway and No signs of Nausea or vomiting  Post-op Vital Signs: Reviewed and stable  Complications: No apparent anesthesia complications

## 2017-05-04 NOTE — H&P (Signed)
[name], MD [address] [place],  [address] Phone:[phone] Fax : [phone]  Primary Care Physician:  Juline Patch, MD Primary Gastroenterologist:  Dr. Allen Norris  Pre-Procedure History & Physical: HPI:  [name] is a 56 y.o. male is here for an colonoscopy.   Past Medical History:  Diagnosis Date  . Anemia 10/2015  . Atrial flutter ([place])    a. on Xarelto; b. s/p successful TEE/DCCV on 08/28/2014  . Gangrene of finger ([place])   . GERD (gastroesophageal reflux disease)   . History of nuclear stress test    a. 09/02/2014: no sig ischemia, GI uptake noted, no sig WMA, EF 48%, no EKG chanes concerning for ischemia, low risk scan    . HTN (hypertension)   . Normal coronary arteries    a. cardiac cath 12/03/2014: no sigificant CAD, right dominant system, LVEF >55%, no MR or AS  . Pulmonary fibrosis ([place])    a. not on home oxygen  . RA (rheumatoid arthritis) ([place])   . 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 echo 07/2014: EF 20-25%, cannot exclude atrial flutter,  global HK, possible bicuspid Ao valve, mod reduced RV sys fxn, mild-mod aortic valve sclerosis/calcification, mod TR, mildly elevated RVSP; b. TEE 08/28/2014: EF 30-35%, no intracardic thrombus, mildly dilated LA/RA, mild TR, mild-mod aortic sclerosis w/o stenosis; c. echo 09/2014: EF 55-60%, select images w/ inf HK    Past Surgical History:  Procedure Laterality Date  . CARDIAC CATHETERIZATION N/A 12/03/2014   Procedure: Left Heart Cath and Coronary Angiography;  Surgeon: Minna Merritts, MD;  Location: [facility];  Service: Cardiovascular;  Laterality: N/A;  . COLONOSCOPY WITH PROPOFOL N/A 11/23/2015   Procedure: COLONOSCOPY WITH PROPOFOL;  Surgeon: [name], MD;  Location: [facility];  Service: Endoscopy;  Laterality: N/A;  . ESOPHAGOGASTRODUODENOSCOPY (EGD) WITH PROPOFOL N/A 11/23/2015   Procedure: ESOPHAGOGASTRODUODENOSCOPY (EGD) WITH PROPOFOL;  Surgeon: [name], MD;  Location: [facility];  Service: Endoscopy;  Laterality: N/A;  . HAND SURGERY Left   . PERIPHERAL VASCULAR CATHETERIZATION N/A 11/17/2015   Procedure: Upper Extremity Angiography;  Surgeon: Wellington Hampshire, MD;  Location: [facility] [facility];  Service: Cardiovascular;  Laterality: N/A;    Prior to Admission medications   Medication Sig Start Date End Date Taking? Authorizing Provider  albuterol (PROVENTIL HFA;VENTOLIN HFA) 108 (90 Base) MCG/ACT inhaler Inhale 2 puffs into the lungs 4 (four) times daily as needed for wheezing or shortness of breath. 03/17/16  Yes Juline Patch, MD  diltiazem (DILACOR XR) 120 MG 24 hr capsule Take 1 capsule (120 mg total) by mouth daily. 03/29/17  Yes Minna Merritts, MD  enalapril (VASOTEC) 20 MG tablet Take 1 tablet (20 mg total) by mouth daily. 09/19/16  Yes Gollan, Kathlene November, MD  fluticasone (FLONASE) 50 MCG/ACT nasal spray Place 2 sprays into both nostrils daily. 06/10/15  Yes Lorin Picket, PA-C  Fluticasone-Salmeterol (ADVAIR DISKUS) 250-50 MCG/DOSE AEPB INHALE 1 DOSE BY MOUTH TWICE DAILY. RINSE MOUTH AFTER USE 03/17/16  Yes Juline Patch, MD  folic acid (FOLVITE) 1 MG tablet Take 1 tablet (1 mg total) by mouth daily. 02/13/17  Yes Cammie Sickle, MD  hydrochlorothiazide (HYDRODIURIL) 25 MG tablet TAKE 1 TABLET BY MOUTH EVERY DAY 03/26/17  Yes Gollan, Kathlene November, MD  hydroxychloroquine (PLAQUENIL) 200 MG tablet Take 400 mg by mouth 2 (two) times daily. Dr Delmer Islam 12/27/15  Yes [provider]  meclizine (ANTIVERT) 25 MG tablet  Take 1 tablet (25 mg total) by mouth 3 (three) times daily as needed for dizziness or nausea. 04/27/17  Yes Lisa Roca, MD  meloxicam (MOBIC) 15 MG tablet Take 1 tablet (15 mg total) by mouth daily. 06/18/15  Yes Stover, Titorya, DPM  metoprolol (LOPRESSOR) 50 MG tablet Take 75 mg by mouth 2 (two) times daily. Dr Delmer Islam   Yes [provider]  mycophenolate (CELLCEPT) 500 MG tablet Take 1,000  mg by mouth 2 (two) times daily.  02/14/16  Yes [provider]  pantoprazole (PROTONIX) 40 MG tablet TAKE 1 TABLET (40 MG TOTAL) BY MOUTH DAILY. 01/09/17  Yes [name], MD  potassium chloride SA (K-DUR,KLOR-CON) 20 MEQ tablet Take 1 tablet (20 mEq total) by mouth daily. 02/07/17  Yes Gollan, Kathlene November, MD  predniSONE (DELTASONE) 20 MG tablet TAKE 1 TABLET (20 MG TOTAL) BY MOUTH DAILY WITH BREAKFAST. Patient taking differently: Take 10 mg by mouth daily with breakfast.  02/21/16  Yes Cammie Sickle, MD  warfarin (COUMADIN) 5 MG tablet TAKE AS DIRECTED BY COUMADIN CLINIC Patient taking differently: take 1 tablet daily 04/05/17  Yes Gollan, Kathlene November, MD  dexlansoprazole (DEXILANT) 60 MG capsule Take 1 capsule (60 mg total) by mouth daily. Patient not taking: Reported on 05/01/2017 08/22/16   [name], MD  diltiazem (CARDIZEM) 30 MG tablet Take 1 tablet (30 mg total) by mouth 3 (three) times daily. Patient not taking: Reported on 04/27/2017 09/11/16   Minna Merritts, MD    Allergies as of 05/01/2017  . (No Known Allergies)    Family History  Problem Relation Age of Onset  . Leukemia Mother     Social History   Socioeconomic History  . Marital status: Married    Spouse name: Not on file  . Number of children: Not on file  . Years of education: Not on file  . Highest education level: Not on file  Social Needs  . Financial resource strain: Not on file  . Food insecurity - worry: Not on file  . Food insecurity - inability: Not on file  . Transportation needs - medical: Not on file  . Transportation needs - non-medical: Not on file  Occupational History  . Not on file  Tobacco Use  . Smoking status: Former Smoker    Packs/day: 1.00    Years: 20.00    Pack years: 20.00    Types: Cigarettes    Last attempt to quit: 08/30/2004    Years since quitting: 12.6  . Smokeless tobacco: Never Used  Substance and Sexual Activity  . Alcohol use: No  . Drug use: No  .  Sexual activity: Not on file  Other Topics Concern  . Not on file  Social History Narrative  . Not on file    Review of Systems: See HPI, otherwise negative ROS  Physical Exam: BP (!) 140/100   Pulse 69   Temp 98.6 F (37 C) (Tympanic)   Resp 18   Ht 5\' 10"  (1.778 m)   Wt 239 lb (108.4 kg)   SpO2 97%   BMI 34.29 kg/m  General:   Alert,  pleasant and cooperative in NAD Head:  Normocephalic and atraumatic. Neck:  Supple; no masses or thyromegaly. Lungs:  Clear throughout to auscultation.    Heart:  Regular rate and rhythm. Abdomen:  Soft, nontender and nondistended. Normal bowel sounds, without guarding, and without rebound.   Neurologic:  Alert and  oriented x4;  grossly normal neurologically.  Impression/Plan:  [name] is here for an colonoscopy to be performed for diarrhea  Risks, benefits, limitations, and alternatives regarding  colonoscopy have been reviewed with the patient.  Questions have been answered.  All parties agreeable.   [name], MD  05/04/2017, 9:26 AM

## 2017-05-04 NOTE — Anesthesia Procedure Notes (Signed)
Performed by: Myrla Malanowski, CRNA Pre-anesthesia Checklist: Patient identified, Emergency Drugs available, Suction available, Timeout performed and Patient being monitored Patient Re-evaluated:Patient Re-evaluated prior to induction Oxygen Delivery Method: Nasal cannula Placement Confirmation: positive ETCO2       

## 2017-05-04 NOTE — Anesthesia Postprocedure Evaluation (Signed)
Anesthesia Post Note  Patient: [name]  Procedure(s) Performed: COLONOSCOPY WITH PROPOFOL (N/A ) POLYPECTOMY  Patient location during evaluation: PACU Anesthesia Type: General Level of consciousness: awake and alert and oriented Pain management: satisfactory to patient Vital Signs Assessment: post-procedure vital signs reviewed and stable Respiratory status: spontaneous breathing, nonlabored ventilation and respiratory function stable Cardiovascular status: blood pressure returned to baseline and stable Postop Assessment: Adequate PO intake and No signs of nausea or vomiting Anesthetic complications: no    Raliegh Ip

## 2017-05-04 NOTE — Op Note (Signed)
[facility] Patient Name: [name] Procedure Date: 05/04/2017 9:29 AM MRN: [id number] Account #: [id number] Date of Birth: 1960-07-31 Admit Type: Outpatient Age: 56 Room: [facility] OR ROOM 01 Gender: Male Note Status: Finalized Procedure:            Colonoscopy Indications:          Chronic diarrhea Providers:            [name] MD, MD Referring MD:         Juline Patch, MD (Referring MD) Medicines:            Propofol per Anesthesia Complications:        No immediate complications. Procedure:            Pre-Anesthesia Assessment:                       - Prior to the procedure, a History and Physical was                        performed, and patient medications and allergies were                        reviewed. The patient's tolerance of previous                        anesthesia was also reviewed. The risks and benefits of                        the procedure and the sedation options and risks were                        discussed with the patient. All questions were                        answered, and informed consent was obtained. Prior                        Anticoagulants: The patient has taken no previous                        anticoagulant or antiplatelet agents. ASA Grade                        Assessment: II - A patient with mild systemic disease.                        After reviewing the risks and benefits, the patient was                        deemed in satisfactory condition to undergo the                        procedure.                       After obtaining informed consent, the colonoscope was                        passed under direct vision. Throughout the procedure,  the patient's blood pressure, pulse, and oxygen                        saturations were monitored continuously. The Olympus                        Colonoscope 190 [id number]) was introduced through the   anus and advanced to the the terminal ileum. The                        colonoscopy was performed without difficulty. The                        patient tolerated the procedure well. The quality of                        the bowel preparation was fair. Findings:      The perianal and digital rectal examinations were normal.      A 3 mm polyp was found in the sigmoid colon. The polyp was sessile. The       polyp was removed with a cold biopsy forceps. Resection and retrieval       were complete.      Multiple small-mouthed diverticula were found in the sigmoid colon.      The terminal ileum appeared normal. Biopsies were taken with a cold       forceps for histology.      Biopsies were taken with a cold forceps in the entire colon for       histology.      Non-bleeding internal hemorrhoids were found during retroflexion. The       hemorrhoids were Grade I (internal hemorrhoids that do not prolapse). Impression:           - Preparation of the colon was fair.                       - One 3 mm polyp in the sigmoid colon, removed with a                        cold biopsy forceps. Resected and retrieved.                       - Diverticulosis in the sigmoid colon.                       - The examined portion of the ileum was normal.                        Biopsied.                       - Non-bleeding internal hemorrhoids.                       - Biopsies were taken with a cold forceps for histology                        in the entire colon. Recommendation:       - Discharge patient to home.                       -  Resume previous diet.                       - Continue present medications.                       - Await pathology results. Procedure Code(s):    --- Professional ---                       [phone], Colonoscopy, flexible; with biopsy, single or                        multiple Diagnosis Code(s):    --- Professional ---                       K52.9, Noninfective gastroenteritis and  colitis,                        unspecified                       D12.5, Benign neoplasm of sigmoid colon CPT copyright 2016 American Medical Association. All rights reserved. The codes documented in this report are preliminary and upon coder review may  be revised to meet current compliance requirements. [name] MD, MD 05/04/2017 9:53:43 AM This report has been signed electronically. Number of Addenda: 0 Note Initiated On: 05/04/2017 9:29 AM Scope Withdrawal Time: 0 hours 7 minutes 29 seconds  Total Procedure Duration: 0 hours 9 minutes 47 seconds       [facility]

## 2017-05-05 ENCOUNTER — Encounter: Payer: Self-pay | Admitting: Gastroenterology

## 2017-05-07 ENCOUNTER — Other Ambulatory Visit: Payer: Self-pay | Admitting: Cardiovascular Disease

## 2017-05-08 ENCOUNTER — Inpatient Hospital Stay: Payer: BLUE CROSS/BLUE SHIELD

## 2017-05-08 ENCOUNTER — Encounter: Payer: Self-pay | Admitting: Gastroenterology

## 2017-05-09 ENCOUNTER — Ambulatory Visit (INDEPENDENT_AMBULATORY_CARE_PROVIDER_SITE_OTHER): Payer: BLUE CROSS/BLUE SHIELD

## 2017-05-09 ENCOUNTER — Encounter: Payer: Self-pay | Admitting: Gastroenterology

## 2017-05-09 DIAGNOSIS — Z5181 Encounter for therapeutic drug level monitoring: Secondary | ICD-10-CM

## 2017-05-09 DIAGNOSIS — I483 Typical atrial flutter: Secondary | ICD-10-CM

## 2017-05-09 LAB — POCT INR: INR: 2.5

## 2017-05-09 NOTE — Patient Instructions (Signed)
Please continue dosage of 1 tablet daily except 1/2 tablet on Mondays & Fridays.   Please be consistent with your green leafy vegetables and green tea.  Recheck in 4 weeks.

## 2017-05-10 ENCOUNTER — Ambulatory Visit: Payer: BLUE CROSS/BLUE SHIELD | Admitting: Gastroenterology

## 2017-05-11 ENCOUNTER — Inpatient Hospital Stay: Payer: BLUE CROSS/BLUE SHIELD

## 2017-05-11 ENCOUNTER — Other Ambulatory Visit: Payer: Self-pay | Admitting: *Deleted

## 2017-05-11 DIAGNOSIS — D591 Other autoimmune hemolytic anemias: Principal | ICD-10-CM

## 2017-05-11 DIAGNOSIS — D5919 Other autoimmune hemolytic anemia: Secondary | ICD-10-CM

## 2017-05-14 ENCOUNTER — Inpatient Hospital Stay: Payer: BLUE CROSS/BLUE SHIELD | Attending: Internal Medicine

## 2017-05-14 DIAGNOSIS — D594 Other nonautoimmune hemolytic anemias: Secondary | ICD-10-CM | POA: Insufficient documentation

## 2017-05-14 DIAGNOSIS — I4891 Unspecified atrial fibrillation: Secondary | ICD-10-CM | POA: Diagnosis not present

## 2017-05-14 DIAGNOSIS — M069 Rheumatoid arthritis, unspecified: Secondary | ICD-10-CM | POA: Insufficient documentation

## 2017-05-14 DIAGNOSIS — D591 Other autoimmune hemolytic anemias: Secondary | ICD-10-CM

## 2017-05-14 DIAGNOSIS — Z7901 Long term (current) use of anticoagulants: Secondary | ICD-10-CM | POA: Diagnosis not present

## 2017-05-14 DIAGNOSIS — I429 Cardiomyopathy, unspecified: Secondary | ICD-10-CM | POA: Diagnosis not present

## 2017-05-14 DIAGNOSIS — Z87891 Personal history of nicotine dependence: Secondary | ICD-10-CM | POA: Insufficient documentation

## 2017-05-14 DIAGNOSIS — Z79899 Other long term (current) drug therapy: Secondary | ICD-10-CM | POA: Insufficient documentation

## 2017-05-14 DIAGNOSIS — I10 Essential (primary) hypertension: Secondary | ICD-10-CM | POA: Insufficient documentation

## 2017-05-14 DIAGNOSIS — D5919 Other autoimmune hemolytic anemia: Secondary | ICD-10-CM

## 2017-05-14 LAB — LACTATE DEHYDROGENASE: LDH: 190 U/L (ref 98–192)

## 2017-05-14 LAB — HEMOGLOBIN: Hemoglobin: 13.5 g/dL (ref 13.0–18.0)

## 2017-05-14 LAB — HEMATOCRIT: HCT: 41.8 % (ref 40.0–52.0)

## 2017-05-19 ENCOUNTER — Other Ambulatory Visit: Payer: Self-pay | Admitting: Family Medicine

## 2017-05-19 DIAGNOSIS — J8410 Pulmonary fibrosis, unspecified: Secondary | ICD-10-CM

## 2017-05-19 DIAGNOSIS — J4521 Mild intermittent asthma with (acute) exacerbation: Secondary | ICD-10-CM

## 2017-06-05 ENCOUNTER — Inpatient Hospital Stay: Payer: BLUE CROSS/BLUE SHIELD | Attending: Internal Medicine

## 2017-06-05 ENCOUNTER — Telehealth: Payer: Self-pay | Admitting: Gastroenterology

## 2017-06-05 DIAGNOSIS — D594 Other nonautoimmune hemolytic anemias: Secondary | ICD-10-CM | POA: Insufficient documentation

## 2017-06-05 DIAGNOSIS — M069 Rheumatoid arthritis, unspecified: Secondary | ICD-10-CM | POA: Diagnosis not present

## 2017-06-05 DIAGNOSIS — Z87891 Personal history of nicotine dependence: Secondary | ICD-10-CM | POA: Insufficient documentation

## 2017-06-05 DIAGNOSIS — Z7901 Long term (current) use of anticoagulants: Secondary | ICD-10-CM | POA: Diagnosis not present

## 2017-06-05 DIAGNOSIS — I429 Cardiomyopathy, unspecified: Secondary | ICD-10-CM | POA: Diagnosis not present

## 2017-06-05 DIAGNOSIS — Z79899 Other long term (current) drug therapy: Secondary | ICD-10-CM | POA: Insufficient documentation

## 2017-06-05 DIAGNOSIS — I4891 Unspecified atrial fibrillation: Secondary | ICD-10-CM | POA: Diagnosis not present

## 2017-06-05 DIAGNOSIS — I10 Essential (primary) hypertension: Secondary | ICD-10-CM | POA: Insufficient documentation

## 2017-06-05 LAB — LACTATE DEHYDROGENASE: LDH: 156 U/L (ref 98–192)

## 2017-06-05 LAB — HEMATOCRIT: HEMATOCRIT: 40.4 % (ref 40.0–52.0)

## 2017-06-05 LAB — HEMOGLOBIN: Hemoglobin: 13.2 g/dL (ref 13.0–18.0)

## 2017-06-05 NOTE — Telephone Encounter (Signed)
Left vm letting pt's wife know a referral has been submitted to [facility] GI clinic.   Referral faxed today.

## 2017-06-05 NOTE — Telephone Encounter (Signed)
Patients wife called and wants a referral to [facility] in [place] as he is still having chronic diarrhea. Please call Diane.

## 2017-06-06 ENCOUNTER — Ambulatory Visit (INDEPENDENT_AMBULATORY_CARE_PROVIDER_SITE_OTHER): Payer: BLUE CROSS/BLUE SHIELD

## 2017-06-06 DIAGNOSIS — I483 Typical atrial flutter: Secondary | ICD-10-CM

## 2017-06-06 DIAGNOSIS — Z5181 Encounter for therapeutic drug level monitoring: Secondary | ICD-10-CM | POA: Diagnosis not present

## 2017-06-06 LAB — POCT INR: INR: 4.3

## 2017-06-06 NOTE — Patient Instructions (Signed)
Please skip coumadin today and tomorrow, then resume dosage of 1 tablet daily except 1/2 tablet on Mondays & Fridays.   Please be consistent with your green leafy vegetables and green tea.  Recheck in 2 weeks.

## 2017-06-20 ENCOUNTER — Ambulatory Visit (INDEPENDENT_AMBULATORY_CARE_PROVIDER_SITE_OTHER): Payer: BLUE CROSS/BLUE SHIELD

## 2017-06-20 DIAGNOSIS — Z5181 Encounter for therapeutic drug level monitoring: Secondary | ICD-10-CM | POA: Diagnosis not present

## 2017-06-20 DIAGNOSIS — I483 Typical atrial flutter: Secondary | ICD-10-CM | POA: Diagnosis not present

## 2017-06-20 LAB — POCT INR: INR: 5.9

## 2017-06-20 NOTE — Patient Instructions (Signed)
Please skip coumadin tomorrow, Friday and Saturday then start new dosage of 1/2 tablet daily except 1 tablet on Mondays & Fridays.   Please start taking your coumadin in the evenings. Please be consistent with your green leafy vegetables and green tea.  Recheck in 1 week.

## 2017-06-27 ENCOUNTER — Ambulatory Visit (INDEPENDENT_AMBULATORY_CARE_PROVIDER_SITE_OTHER): Payer: BLUE CROSS/BLUE SHIELD

## 2017-06-27 DIAGNOSIS — Z5181 Encounter for therapeutic drug level monitoring: Secondary | ICD-10-CM

## 2017-06-27 DIAGNOSIS — I483 Typical atrial flutter: Secondary | ICD-10-CM

## 2017-06-27 LAB — POCT INR: INR: 2.2

## 2017-06-27 NOTE — Patient Instructions (Signed)
Please continue current dosage of 1/2 tablet daily except 1 tablet on Mondays & Fridays.   Please be consistent with your green leafy vegetables and green tea.  Recheck in 2 weeks.

## 2017-06-28 ENCOUNTER — Telehealth: Payer: Self-pay | Admitting: Cardiovascular Disease

## 2017-06-28 NOTE — Telephone Encounter (Signed)
Pt calls to confirm that no one is discuss medical information with ex-wife, Diane stating "some things have gotten out and I was just curious how they got out". States "I just want to make sure we are in agreement" that his ex-wife may not be given any information. Explained HIPPA policy and how it is documented on DPR that current and ex-wife have same name, noting current wife's middle name.  Assured patient that HIPPA policy is strictly enforced and that I have not, nor will I ever, speak to ex-wife about patient. Pt apologized for wondering if I had spoken with ex-wife. He had no further questions at this time.

## 2017-06-28 NOTE — Telephone Encounter (Signed)
Please call regarding HIPPAA. He does not want any information released to his ex wife.

## 2017-07-03 ENCOUNTER — Inpatient Hospital Stay: Payer: BLUE CROSS/BLUE SHIELD | Attending: Internal Medicine

## 2017-07-03 DIAGNOSIS — D594 Other nonautoimmune hemolytic anemias: Secondary | ICD-10-CM | POA: Insufficient documentation

## 2017-07-03 LAB — LACTATE DEHYDROGENASE: LDH: 139 U/L (ref 98–192)

## 2017-07-03 LAB — HEMOGLOBIN: HEMOGLOBIN: 12.3 g/dL — AB (ref 13.0–18.0)

## 2017-07-03 LAB — HEMATOCRIT: HCT: 37.4 % — ABNORMAL LOW (ref 40.0–52.0)

## 2017-07-11 ENCOUNTER — Other Ambulatory Visit: Payer: Self-pay | Admitting: Cardiovascular Disease

## 2017-07-11 ENCOUNTER — Ambulatory Visit (INDEPENDENT_AMBULATORY_CARE_PROVIDER_SITE_OTHER): Payer: BLUE CROSS/BLUE SHIELD

## 2017-07-11 DIAGNOSIS — I483 Typical atrial flutter: Secondary | ICD-10-CM

## 2017-07-11 DIAGNOSIS — Z5181 Encounter for therapeutic drug level monitoring: Secondary | ICD-10-CM | POA: Diagnosis not present

## 2017-07-11 DIAGNOSIS — I10 Essential (primary) hypertension: Secondary | ICD-10-CM

## 2017-07-11 LAB — POCT INR: INR: 2.5

## 2017-07-11 NOTE — Patient Instructions (Signed)
Please continue current dosage of 1/2 tablet daily except 1 tablet on Mondays & Fridays.   Please be consistent with your green leafy vegetables and green tea.  Recheck in 3 weeks.

## 2017-07-19 ENCOUNTER — Other Ambulatory Visit: Payer: Self-pay | Admitting: Cardiovascular Disease

## 2017-07-19 DIAGNOSIS — I10 Essential (primary) hypertension: Secondary | ICD-10-CM

## 2017-07-23 ENCOUNTER — Telehealth: Payer: Self-pay | Admitting: Cardiovascular Disease

## 2017-07-23 DIAGNOSIS — M329 Systemic lupus erythematosus, unspecified: Secondary | ICD-10-CM | POA: Insufficient documentation

## 2017-07-23 DIAGNOSIS — E559 Vitamin D deficiency, unspecified: Secondary | ICD-10-CM | POA: Insufficient documentation

## 2017-07-23 NOTE — Telephone Encounter (Signed)
Spoke with patient and he denies any changes since last visit. Advised that I would route clearance and medication request over to Dr. Rockey Situ for review. He was appreciative for the call and had no further questions at this time.

## 2017-07-23 NOTE — Telephone Encounter (Signed)
° °  [facility] Pre-operative Risk Assessment    Request for surgical clearance:  What type of surgery is being performed? Upper endoscopy   1. When is this surgery scheduled? 07-27-17  2. What type of clearance is required (medical clearance vs. Pharmacy clearance to hold med vs. Both)? Medication - patient takes coumadin   3. Are there any medications that need to be held prior to surgery and how long? Coumadin ? Please advise   4. Practice name and name of physician performing surgery?  [facility] per patient  5. What is your office phone and fax number?  Unknown   6. Anesthesia type (None, local, MAC, general) ? Patient not sure what kind but yes    Clarisse Gouge 07/23/2017, 2:01 PM  _________________________________________________________________   (provider comments below)

## 2017-07-24 NOTE — Telephone Encounter (Signed)
   [facility] Pre-operative Risk Assessment    Request for surgical clearance:  1. What type of surgery is being performed? EGD  2. When is this surgery scheduled? Not listed  3. What type of clearance is required (medical clearance vs. Pharmacy clearance to hold med vs. Both)?not listed  4. Are there any medications that need to be held prior to surgery and how long?coumadin  5. Practice name and name of physician performing surgery? [facility]  6. What is your office phone and fax number? [phone], fax [phone]  7. Anesthesia type (None, local, MAC, general) ? Not listed    Lucienne Minks 07/24/2017, 9:55 AM  _________________________________________________________________   (provider comments below)

## 2017-07-26 NOTE — Telephone Encounter (Signed)
Clearance faxed to number provided.

## 2017-07-26 NOTE — Telephone Encounter (Signed)
Pt is calling to get status of his clearance. Pt is concerned about having to r/s his surgery. States he has already had to r/s and wants to avoid doing this again.

## 2017-07-26 NOTE — Telephone Encounter (Signed)
Per verbal from Dr Rockey Situ, patient should be off coumadin for 5 days prior to EGD.  S/w patient. His EGD has been rescheduled to Monday, 07/30/17. Advised him to speak with his GI doctor concerning scheduling. This encounter faxed to GI.

## 2017-07-26 NOTE — Telephone Encounter (Signed)
Acceptable risk to hold the warfarin 5 days prior to procedure Restart warfarin after the procedure

## 2017-07-31 ENCOUNTER — Inpatient Hospital Stay: Payer: BLUE CROSS/BLUE SHIELD | Attending: Internal Medicine | Admitting: Internal Medicine

## 2017-07-31 ENCOUNTER — Other Ambulatory Visit: Payer: Self-pay

## 2017-07-31 ENCOUNTER — Inpatient Hospital Stay: Payer: BLUE CROSS/BLUE SHIELD

## 2017-07-31 VITALS — BP 107/75 | HR 73 | Temp 98.0°F | Resp 18 | Wt 221.7 lb

## 2017-07-31 DIAGNOSIS — D589 Hereditary hemolytic anemia, unspecified: Secondary | ICD-10-CM | POA: Insufficient documentation

## 2017-07-31 DIAGNOSIS — K56600 Partial intestinal obstruction, unspecified as to cause: Secondary | ICD-10-CM | POA: Diagnosis not present

## 2017-07-31 DIAGNOSIS — I10 Essential (primary) hypertension: Secondary | ICD-10-CM | POA: Diagnosis not present

## 2017-07-31 DIAGNOSIS — I429 Cardiomyopathy, unspecified: Secondary | ICD-10-CM | POA: Diagnosis not present

## 2017-07-31 DIAGNOSIS — Z87891 Personal history of nicotine dependence: Secondary | ICD-10-CM | POA: Insufficient documentation

## 2017-07-31 DIAGNOSIS — Z79899 Other long term (current) drug therapy: Secondary | ICD-10-CM | POA: Insufficient documentation

## 2017-07-31 DIAGNOSIS — M069 Rheumatoid arthritis, unspecified: Secondary | ICD-10-CM | POA: Insufficient documentation

## 2017-07-31 DIAGNOSIS — I4892 Unspecified atrial flutter: Secondary | ICD-10-CM | POA: Diagnosis not present

## 2017-07-31 DIAGNOSIS — Z7901 Long term (current) use of anticoagulants: Secondary | ICD-10-CM | POA: Insufficient documentation

## 2017-07-31 DIAGNOSIS — D594 Other nonautoimmune hemolytic anemias: Secondary | ICD-10-CM

## 2017-07-31 DIAGNOSIS — K219 Gastro-esophageal reflux disease without esophagitis: Secondary | ICD-10-CM | POA: Diagnosis not present

## 2017-07-31 LAB — HEMOGLOBIN: Hemoglobin: 13.3 g/dL (ref 13.0–18.0)

## 2017-07-31 LAB — LACTATE DEHYDROGENASE: LDH: 151 U/L (ref 98–192)

## 2017-07-31 LAB — HEMATOCRIT: HCT: 41 % (ref 40.0–52.0)

## 2017-07-31 NOTE — Progress Notes (Signed)
[place] OFFICE PROGRESS NOTE  Patient Care Team: Juline Patch, MD as PCP - General (Family Medicine) Minna Merritts, MD as Consulting Physician (Cardiology)    No history exists.   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  male pleasant patient above history of Autoimmune hemolytic anemia- currently on prednisone 10 mg/CellCept 500 twice a day [also for Autoimmune/connective tissue disorder]- is here for follow-up.   Patient states that he was recently evaluated for abdominal pain at [facility] upper endoscopy and also a CT scan that showed possible partial low-grade obstruction.   Patient denies any difficulty swallowing or pain with swallowing. Patient's appetite is good. Patient denies any worsening shortness of breath or chest pain. No nausea no vomiting.   Patient is currently on Coumadin for his A. fib.  No blood in stools or black stools.  REVIEW OF SYSTEMS:  A complete 10 point review of system is done which is negative except mentioned above/history of present illness.   PAST MEDICAL HISTORY :  Past Medical History:  Diagnosis Date  . Anemia 10/2015  . Atrial flutter ([place])    a. on Xarelto; b. s/p successful TEE/DCCV on 08/28/2014  . Gangrene of finger ([place])   . GERD (gastroesophageal reflux disease)   . History of nuclear stress test    a. 09/02/2014: no sig ischemia, GI uptake noted, no sig WMA, EF 48%, no EKG chanes concerning for ischemia, low risk scan    . HTN (hypertension)   . Normal coronary arteries    a. cardiac cath 12/03/2014: no sigificant CAD, right dominant  system, LVEF >55%, no MR or AS  . Pulmonary fibrosis ([place])    a. not on home oxygen  . RA (rheumatoid arthritis) ([place])   . 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 echo 07/2014: EF 20-25%, cannot exclude atrial flutter,  global HK, possible bicuspid Ao valve, mod reduced RV sys fxn, mild-mod aortic valve sclerosis/calcification, mod TR, mildly elevated RVSP; b. TEE 08/28/2014: EF 30-35%, no intracardic thrombus, mildly dilated LA/RA, mild TR, mild-mod aortic sclerosis w/o stenosis; c. echo 09/2014: EF 55-60%, select images w/ inf HK    PAST SURGICAL HISTORY :   Past Surgical History:  Procedure Laterality Date  . CARDIAC CATHETERIZATION N/A 12/03/2014   Procedure: Left Heart Cath and Coronary Angiography;  Surgeon: Minna Merritts, MD;  Location: [place] CV LAB;  Service: Cardiovascular;  Laterality: N/A;  . COLONOSCOPY WITH PROPOFOL N/A 11/23/2015   Procedure: COLONOSCOPY WITH PROPOFOL;  Surgeon: [name], MD;  Location: [facility] ENDOSCOPY;  Service: Endoscopy;  Laterality: N/A;  . COLONOSCOPY WITH PROPOFOL N/A 05/04/2017   Procedure: COLONOSCOPY WITH PROPOFOL;  Surgeon: [name], MD;  Location: [place];  Service: Endoscopy;  Laterality: N/A;  . ESOPHAGOGASTRODUODENOSCOPY (EGD) WITH PROPOFOL N/A 11/23/2015   Procedure: ESOPHAGOGASTRODUODENOSCOPY (EGD) WITH PROPOFOL;  Surgeon: [name], MD;  Location: [facility] ENDOSCOPY;  Service: Endoscopy;  Laterality: N/A;  . HAND SURGERY Left   . PERIPHERAL VASCULAR CATHETERIZATION N/A 11/17/2015   Procedure: Upper Extremity Angiography;  Surgeon: Wellington Hampshire, MD;  Location: [place] CV LAB;  Service: Cardiovascular;  Laterality: N/A;  . POLYPECTOMY  05/04/2017  Procedure: POLYPECTOMY;  Surgeon: [name], MD;  Location: [place];  Service: Endoscopy;;    FAMILY HISTORY :   Family History  Problem Relation Age of Onset  . Leukemia Mother     SOCIAL HISTORY:   Social History   Tobacco Use  . Smoking  status: Former Smoker    Packs/day: 1.00    Years: 20.00    Pack years: 20.00    Types: Cigarettes    Last attempt to quit: 08/30/2004    Years since quitting: 12.9  . Smokeless tobacco: Never Used  Substance Use Topics  . Alcohol use: No  . Drug use: No    ALLERGIES:  has No Known Allergies.  MEDICATIONS:  Current Outpatient Medications  Medication Sig Dispense Refill  . dexlansoprazole (DEXILANT) 60 MG capsule Take 1 capsule (60 mg total) by mouth daily. 30 capsule 11  . diltiazem (CARDIZEM) 30 MG tablet Take 1 tablet (30 mg total) by mouth 3 (three) times daily. 90 tablet 3  . diltiazem (DILACOR XR) 120 MG 24 hr capsule Take 1 capsule (120 mg total) by mouth daily. 90 capsule 3  . enalapril (VASOTEC) 20 MG tablet Take 1 tablet (20 mg total) by mouth daily. 90 tablet 3  . folic acid (FOLVITE) 1 MG tablet Take 1 tablet (1 mg total) by mouth daily. 90 tablet 1  . hydrochlorothiazide (HYDRODIURIL) 25 MG tablet TAKE 1 TABLET BY MOUTH EVERY DAY 30 tablet 3  . hydroxychloroquine (PLAQUENIL) 200 MG tablet Take 400 mg by mouth 2 (two) times daily. Dr Delmer Islam    . meclizine (ANTIVERT) 25 MG tablet Take 1 tablet (25 mg total) by mouth 3 (three) times daily as needed for dizziness or nausea. 20 tablet 0  . meloxicam (MOBIC) 15 MG tablet Take 1 tablet (15 mg total) by mouth daily. 30 tablet 0  . metoprolol (LOPRESSOR) 50 MG tablet Take 75 mg by mouth 2 (two) times daily. Dr Delmer Islam    . Multiple Vitamin (MULTI-VITAMINS) TABS Take by mouth.    . mycophenolate (CELLCEPT) 500 MG tablet Take 1,000 mg by mouth 2 (two) times daily.   11  . pantoprazole (PROTONIX) 40 MG tablet TAKE 1 TABLET (40 MG TOTAL) BY MOUTH DAILY. (Patient taking differently: Take 80 mg by mouth daily. ) 30 tablet 6  . potassium chloride SA (K-DUR,KLOR-CON) 20 MEQ tablet Take 1 tablet (20 mEq total) by mouth daily. 90 tablet 3  . predniSONE (DELTASONE) 20 MG tablet TAKE 1 TABLET (20 MG TOTAL) BY MOUTH DAILY WITH BREAKFAST.  (Patient taking differently: Take 10 mg by mouth daily with breakfast. ) 30 tablet 0  . triamcinolone cream (KENALOG) 0.5 % Apply topically.    . warfarin (COUMADIN) 5 MG tablet TAKE AS DIRECTED BY COUMADIN CLINIC (Patient taking differently: take 1 tablet daily) 30 tablet 3  . albuterol (PROVENTIL HFA;VENTOLIN HFA) 108 (90 Base) MCG/ACT inhaler Inhale 2 puffs into the lungs 4 (four) times daily as needed for wheezing or shortness of breath. (Patient not taking: Reported on 07/31/2017) 18 Inhaler 5  . fluticasone (FLONASE) 50 MCG/ACT nasal spray Place 2 sprays into both nostrils daily. (Patient not taking: Reported on 07/31/2017) 16 g 0  . Fluticasone-Salmeterol (ADVAIR DISKUS) 250-50 MCG/DOSE AEPB INHALE 1 DOSE BY MOUTH TWICE DAILY. RINSE MOUTH AFTER USE (Patient not taking: Reported on 07/31/2017) 60 each 4   No current facility-administered medications for this visit.     PHYSICAL EXAMINATION:   BP 107/75 (BP Location: Left Arm, Patient  Position: Sitting)   Pulse 73   Temp 98 F (36.7 C) (Tympanic)   Resp 18   Wt 221 lb 10.8 oz (100.5 kg)   BMI 31.81 kg/m   Filed Weights   07/31/17 0946  Weight: 221 lb 10.8 oz (100.5 kg)    GENERAL: Well-nourished well-developed; Alert, no distress and comfortable.  Alone.  EYES: no pallor or icterus OROPHARYNX: no thrush or ulceration; good dentition  NECK: supple, no masses felt LYMPH:  no palpable lymphadenopathy in the cervical, axillary or inguinal regions LUNGS: clear to auscultation and  No wheeze or crackles HEART/CVS: regular rate & rhythm and no murmurs; No lower extremity edema ABDOMEN:abdomen soft, non-tender and normal bowel sounds Musculoskeletal:no cyanosis of digits and no clubbing  PSYCH: alert & oriented x 3 with fluent speech NEURO: no focal motor/sensory deficits SKIN:  no rashes or significant lesions  LABORATORY DATA:  I have reviewed the data as listed    Component Value Date/Time   NA 142 04/27/2017 1134   NA 138  12/11/2014 1518   NA 137 08/27/2014 0403   K 3.6 04/27/2017 1134   K 4.1 08/27/2014 0403   CL 106 04/27/2017 1134   CL 108 08/27/2014 0403   CO2 25 04/27/2017 1134   CO2 26 08/27/2014 0403   GLUCOSE 91 04/27/2017 1134   GLUCOSE 94 08/27/2014 0403   BUN 17 04/27/2017 1134   BUN 24 12/11/2014 1518   BUN 16 08/27/2014 0403   CREATININE 1.04 04/27/2017 1134   CREATININE 0.85 08/27/2014 0403   CALCIUM 9.3 04/27/2017 1134   CALCIUM 8.6 (L) 08/27/2014 0403   PROT 7.4 04/26/2017 1351   ALBUMIN 3.9 04/26/2017 1351   AST 24 04/26/2017 1351   ALT 29 04/26/2017 1351   ALKPHOS 50 04/26/2017 1351   BILITOT 0.4 04/26/2017 1351   GFRNONAA >60 04/27/2017 1134   GFRNONAA >60 08/27/2014 0403   GFRAA >60 04/27/2017 1134   GFRAA >60 08/27/2014 0403    No results found for: SPEP, UPEP  Lab Results  Component Value Date   WBC 9.3 04/27/2017   NEUTROABS 6.4 04/26/2017   HGB 13.3 07/31/2017   HCT 41.0 07/31/2017   MCV 85.7 04/27/2017   PLT 204 04/27/2017      Chemistry      Component Value Date/Time   NA 142 04/27/2017 1134   NA 138 12/11/2014 1518   NA 137 08/27/2014 0403   K 3.6 04/27/2017 1134   K 4.1 08/27/2014 0403   CL 106 04/27/2017 1134   CL 108 08/27/2014 0403   CO2 25 04/27/2017 1134   CO2 26 08/27/2014 0403   BUN 17 04/27/2017 1134   BUN 24 12/11/2014 1518   BUN 16 08/27/2014 0403   CREATININE 1.04 04/27/2017 1134   CREATININE 0.85 08/27/2014 0403      Component Value Date/Time   CALCIUM 9.3 04/27/2017 1134   CALCIUM 8.6 (L) 08/27/2014 0403   ALKPHOS 50 04/26/2017 1351   AST 24 04/26/2017 1351   ALT 29 04/26/2017 1351   BILITOT 0.4 04/26/2017 1351       RADIOGRAPHIC STUDIES: I have personally reviewed the radiological images as listed and agreed with the findings in the report. No results found.   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 Keep  prednisone 10 mg/day; and cell cept.  recommend folic acid [no inetarction with coumaidn]    # ? Abdominal discomfort- CT scan feb 2019-/[facility]- ? Low grade partial SBO; s/p EGD at [facility].   # History of A. Fib- on coumadin [ switched from Xarelto sec to cost issues]; recent INR- 1.7; monitored by cardiology.   # check H&H/LDh in 2 month; follow up with me in 4 months.   Orders Placed This Encounter  Procedures  . Hemoglobin [facility])    Standing Status:   Standing    Number of Occurrences:   10    Standing Expiration Date:   08/01/2018  . Hematocrit ([facility])    Standing Status:   Standing    Number of Occurrences:   10    Standing Expiration Date:   08/01/2018  . Lactate dehydrogenase    Standing Status:   Standing    Number of Occurrences:   10    Standing Expiration Date:   08/01/2018   All questions were answered. The patient knows to call the clinic with any problems, questions or concerns.      Cammie Sickle, MD 07/31/2017 3:15 PM

## 2017-07-31 NOTE — Progress Notes (Signed)
Here for follow up.  Colonoscopy in Dec -normal findings per pt but has diarrhea x5-8 x per day. Has endoscopy 07/30/16- @ [facility]-was told small intestine dilated. Has to follow up w Dr soon. Stated frequent vomiting one hr after most meals x 4 mo. Has Dr s at [facility] he is following up with. Stated energy level has been good.

## 2017-07-31 NOTE — Assessment & Plan Note (Signed)
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 Keep prednisone 10 mg/day; and cell cept.  recommend folic acid [no inetarction with coumaidn]    # ? Abdominal discomfort- CT scan feb 2019-/[facility]- ? Low grade partial SBO; s/p EGD at [facility].   # History of A. Fib- on coumadin [ switched from Xarelto sec to cost issues]; recent INR- 1.7; monitored by cardiology.   # check H&H/LDh in 2 month; follow up with me in 4 months.

## 2017-08-01 ENCOUNTER — Ambulatory Visit (INDEPENDENT_AMBULATORY_CARE_PROVIDER_SITE_OTHER): Payer: BLUE CROSS/BLUE SHIELD

## 2017-08-01 DIAGNOSIS — Z5181 Encounter for therapeutic drug level monitoring: Secondary | ICD-10-CM | POA: Diagnosis not present

## 2017-08-01 DIAGNOSIS — I483 Typical atrial flutter: Secondary | ICD-10-CM

## 2017-08-01 LAB — POCT INR: INR: 1.1

## 2017-08-01 NOTE — Patient Instructions (Signed)
Please take 1.5 tablets today and tomorrow, then resume previous dosage of 1/2 tablet daily except 1 tablet on Mondays & Fridays.   Please be consistent with your green leafy vegetables and green tea.  Recheck in 1 week.

## 2017-08-06 ENCOUNTER — Other Ambulatory Visit: Payer: Self-pay | Admitting: Cardiovascular Disease

## 2017-08-06 ENCOUNTER — Other Ambulatory Visit: Payer: Self-pay | Admitting: Gastroenterology

## 2017-08-06 NOTE — Telephone Encounter (Signed)
Please review for refill, Thanks !  

## 2017-08-08 ENCOUNTER — Ambulatory Visit (INDEPENDENT_AMBULATORY_CARE_PROVIDER_SITE_OTHER): Payer: BLUE CROSS/BLUE SHIELD

## 2017-08-08 DIAGNOSIS — Z5181 Encounter for therapeutic drug level monitoring: Secondary | ICD-10-CM

## 2017-08-08 DIAGNOSIS — I483 Typical atrial flutter: Secondary | ICD-10-CM | POA: Diagnosis not present

## 2017-08-08 LAB — POCT INR: INR: 2.6

## 2017-08-08 NOTE — Patient Instructions (Signed)
Please continue previous dosage of 1/2 tablet daily except 1 tablet on Mondays & Fridays.   Please be consistent with your green leafy vegetables and green tea.  Recheck in 3 weeks.

## 2017-08-21 ENCOUNTER — Other Ambulatory Visit: Payer: Self-pay | Admitting: Family Medicine

## 2017-08-21 DIAGNOSIS — J4521 Mild intermittent asthma with (acute) exacerbation: Secondary | ICD-10-CM

## 2017-08-21 DIAGNOSIS — J8410 Pulmonary fibrosis, unspecified: Secondary | ICD-10-CM

## 2017-08-29 ENCOUNTER — Ambulatory Visit (INDEPENDENT_AMBULATORY_CARE_PROVIDER_SITE_OTHER): Payer: BLUE CROSS/BLUE SHIELD

## 2017-08-29 DIAGNOSIS — Z5181 Encounter for therapeutic drug level monitoring: Secondary | ICD-10-CM

## 2017-08-29 DIAGNOSIS — I483 Typical atrial flutter: Secondary | ICD-10-CM | POA: Diagnosis not present

## 2017-08-29 LAB — POCT INR: INR: 2.2

## 2017-08-29 NOTE — Patient Instructions (Signed)
Please continue previous dosage of 1/2 tablet daily except 1 tablet on Mondays & Fridays.   Please be consistent with your green leafy vegetables and green tea.  Recheck in 4 weeks.

## 2017-09-25 ENCOUNTER — Inpatient Hospital Stay: Payer: BLUE CROSS/BLUE SHIELD | Attending: Internal Medicine

## 2017-09-25 DIAGNOSIS — K56600 Partial intestinal obstruction, unspecified as to cause: Secondary | ICD-10-CM | POA: Diagnosis not present

## 2017-09-25 DIAGNOSIS — D594 Other nonautoimmune hemolytic anemias: Secondary | ICD-10-CM

## 2017-09-25 DIAGNOSIS — D589 Hereditary hemolytic anemia, unspecified: Secondary | ICD-10-CM | POA: Insufficient documentation

## 2017-09-25 DIAGNOSIS — K219 Gastro-esophageal reflux disease without esophagitis: Secondary | ICD-10-CM | POA: Insufficient documentation

## 2017-09-25 DIAGNOSIS — I429 Cardiomyopathy, unspecified: Secondary | ICD-10-CM | POA: Diagnosis not present

## 2017-09-25 DIAGNOSIS — Z79899 Other long term (current) drug therapy: Secondary | ICD-10-CM | POA: Diagnosis not present

## 2017-09-25 DIAGNOSIS — Z87891 Personal history of nicotine dependence: Secondary | ICD-10-CM | POA: Insufficient documentation

## 2017-09-25 DIAGNOSIS — M069 Rheumatoid arthritis, unspecified: Secondary | ICD-10-CM | POA: Insufficient documentation

## 2017-09-25 DIAGNOSIS — Z7901 Long term (current) use of anticoagulants: Secondary | ICD-10-CM | POA: Diagnosis not present

## 2017-09-25 DIAGNOSIS — I4892 Unspecified atrial flutter: Secondary | ICD-10-CM | POA: Insufficient documentation

## 2017-09-25 DIAGNOSIS — I10 Essential (primary) hypertension: Secondary | ICD-10-CM | POA: Insufficient documentation

## 2017-09-25 LAB — HEMATOCRIT: HCT: 37.4 % — ABNORMAL LOW (ref 40.0–52.0)

## 2017-09-25 LAB — HEMOGLOBIN: HEMOGLOBIN: 12 g/dL — AB (ref 13.0–18.0)

## 2017-09-25 LAB — LACTATE DEHYDROGENASE: LDH: 143 U/L (ref 98–192)

## 2017-09-26 ENCOUNTER — Ambulatory Visit (INDEPENDENT_AMBULATORY_CARE_PROVIDER_SITE_OTHER): Payer: BLUE CROSS/BLUE SHIELD

## 2017-09-26 ENCOUNTER — Other Ambulatory Visit: Payer: Self-pay | Admitting: Family Medicine

## 2017-09-26 DIAGNOSIS — Z5181 Encounter for therapeutic drug level monitoring: Secondary | ICD-10-CM

## 2017-09-26 DIAGNOSIS — I483 Typical atrial flutter: Secondary | ICD-10-CM

## 2017-09-26 DIAGNOSIS — J8410 Pulmonary fibrosis, unspecified: Secondary | ICD-10-CM

## 2017-09-26 DIAGNOSIS — J4521 Mild intermittent asthma with (acute) exacerbation: Secondary | ICD-10-CM

## 2017-09-26 LAB — POCT INR: INR: 2.3

## 2017-09-26 NOTE — Patient Instructions (Signed)
Please continue previous dosage of 1/2 tablet daily except 1 tablet on Mondays & Fridays.   Please be consistent with your green leafy vegetables and green tea.  Recheck in 5 weeks.

## 2017-09-27 ENCOUNTER — Other Ambulatory Visit: Payer: Self-pay | Admitting: Cardiovascular Disease

## 2017-10-01 ENCOUNTER — Ambulatory Visit
Admission: RE | Admit: 2017-10-01 | Discharge: 2017-10-01 | Disposition: A | Discharge status: Home / Self Care | Payer: BLUE CROSS/BLUE SHIELD | Source: Ambulatory Visit | Attending: Medical Oncology | Admitting: Medical Oncology

## 2017-10-01 ENCOUNTER — Other Ambulatory Visit: Payer: Self-pay | Admitting: Medical Oncology

## 2017-10-01 DIAGNOSIS — R05 Cough: Secondary | ICD-10-CM

## 2017-10-01 DIAGNOSIS — R062 Wheezing: Secondary | ICD-10-CM | POA: Diagnosis present

## 2017-10-01 DIAGNOSIS — R918 Other nonspecific abnormal finding of lung field: Secondary | ICD-10-CM | POA: Diagnosis not present

## 2017-10-01 DIAGNOSIS — R058 Other specified cough: Secondary | ICD-10-CM

## 2017-10-31 ENCOUNTER — Ambulatory Visit (INDEPENDENT_AMBULATORY_CARE_PROVIDER_SITE_OTHER): Payer: BLUE CROSS/BLUE SHIELD

## 2017-10-31 DIAGNOSIS — Z5181 Encounter for therapeutic drug level monitoring: Secondary | ICD-10-CM

## 2017-10-31 DIAGNOSIS — I483 Typical atrial flutter: Secondary | ICD-10-CM | POA: Diagnosis not present

## 2017-10-31 LAB — POCT INR: INR: 1.8 — AB (ref 2.0–3.0)

## 2017-10-31 NOTE — Patient Instructions (Signed)
Please take 1 whole tablet today, then continue previous dosage of 1/2 tablet daily except 1 tablet on Mondays & Fridays.   Please be consistent with your green leafy vegetables and green tea.  Recheck in 4 weeks.

## 2017-11-20 ENCOUNTER — Inpatient Hospital Stay: Payer: BLUE CROSS/BLUE SHIELD

## 2017-11-20 ENCOUNTER — Inpatient Hospital Stay: Payer: BLUE CROSS/BLUE SHIELD | Attending: Internal Medicine | Admitting: Internal Medicine

## 2017-11-20 ENCOUNTER — Encounter: Payer: Self-pay | Admitting: Internal Medicine

## 2017-11-20 VITALS — BP 155/93 | HR 67 | Temp 97.3°F | Resp 16 | Wt 208.4 lb

## 2017-11-20 DIAGNOSIS — Z79899 Other long term (current) drug therapy: Secondary | ICD-10-CM | POA: Diagnosis not present

## 2017-11-20 DIAGNOSIS — Z7901 Long term (current) use of anticoagulants: Secondary | ICD-10-CM | POA: Diagnosis not present

## 2017-11-20 DIAGNOSIS — D591 Other autoimmune hemolytic anemias: Secondary | ICD-10-CM | POA: Insufficient documentation

## 2017-11-20 DIAGNOSIS — D594 Other nonautoimmune hemolytic anemias: Secondary | ICD-10-CM

## 2017-11-20 DIAGNOSIS — R197 Diarrhea, unspecified: Secondary | ICD-10-CM | POA: Insufficient documentation

## 2017-11-20 DIAGNOSIS — Z7952 Long term (current) use of systemic steroids: Secondary | ICD-10-CM | POA: Insufficient documentation

## 2017-11-20 DIAGNOSIS — I4891 Unspecified atrial fibrillation: Secondary | ICD-10-CM | POA: Diagnosis not present

## 2017-11-20 LAB — HEMOGLOBIN: HEMOGLOBIN: 13 g/dL (ref 13.0–18.0)

## 2017-11-20 LAB — HEMATOCRIT: HEMATOCRIT: 40.2 % (ref 40.0–52.0)

## 2017-11-20 LAB — LACTATE DEHYDROGENASE: LDH: 137 U/L (ref 98–192)

## 2017-11-20 NOTE — Progress Notes (Signed)
[place] OFFICE PROGRESS NOTE  Patient Care Team: Lovett Sox as PCP - General (Physician Assistant) Minna Merritts, MD as Consulting Physician (Cardiology)    No history exists.   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  male pleasant patient above history of Autoimmune hemolytic anemia- currently on prednisone 10 mg/CellCept 500 twice a day [also for Autoimmune/connective tissue disorder]- is here for follow-up.   Patient had extensive work-up at [facility] including CT scan EGD/colonoscopy for ongoing diarrhea.  Patient states that his diarrhea improved after stopping folic acid.  Denies any shortness of breath or cough.  Denies any difficulty swallowing.  No blood in stools black or stools.  Continues with Coumadin for his A. fib.   REVIEW OF SYSTEMS:  A complete 10 point review of system is done which is negative except mentioned above/history of present illness.   PAST MEDICAL HISTORY :  Past Medical History:  Diagnosis Date  . Anemia 10/2015  . Atrial flutter ([place])    a. on Xarelto; b. s/p successful TEE/DCCV on 08/28/2014  . Gangrene of finger ([place])   . GERD (gastroesophageal reflux disease)   . History of nuclear stress test    a. 09/02/2014: no sig ischemia, GI uptake noted, no sig WMA, EF 48%, no EKG chanes concerning for ischemia, low risk scan    . HTN (hypertension)   . Normal coronary arteries    a. cardiac cath 12/03/2014: no sigificant CAD, right dominant system, LVEF >55%, no MR or AS  . Pulmonary fibrosis ([place])    a. not on home oxygen  . RA (rheumatoid arthritis)  ([place])   . 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 echo 07/2014: EF 20-25%, cannot exclude atrial flutter,  global HK, possible bicuspid Ao valve, mod reduced RV sys fxn, mild-mod aortic valve sclerosis/calcification, mod TR, mildly elevated RVSP; b. TEE 08/28/2014: EF 30-35%, no intracardic thrombus, mildly dilated LA/RA, mild TR, mild-mod aortic sclerosis w/o stenosis; c. echo 09/2014: EF 55-60%, select images w/ inf HK    PAST SURGICAL HISTORY :   Past Surgical History:  Procedure Laterality Date  . CARDIAC CATHETERIZATION N/A 12/03/2014   Procedure: Left Heart Cath and Coronary Angiography;  Surgeon: Minna Merritts, MD;  Location: [place] CV LAB;  Service: Cardiovascular;  Laterality: N/A;  . COLONOSCOPY WITH PROPOFOL N/A 11/23/2015   Procedure: COLONOSCOPY WITH PROPOFOL;  Surgeon: [name], MD;  Location: [facility] ENDOSCOPY;  Service: Endoscopy;  Laterality: N/A;  . COLONOSCOPY WITH PROPOFOL N/A 05/04/2017   Procedure: COLONOSCOPY WITH PROPOFOL;  Surgeon: [name], MD;  Location: [place];  Service: Endoscopy;  Laterality: N/A;  . ESOPHAGOGASTRODUODENOSCOPY (EGD) WITH PROPOFOL N/A 11/23/2015   Procedure: ESOPHAGOGASTRODUODENOSCOPY (EGD) WITH PROPOFOL;  Surgeon: [name], MD;  Location: [facility] ENDOSCOPY;  Service: Endoscopy;  Laterality: N/A;  . HAND SURGERY Left   . PERIPHERAL VASCULAR CATHETERIZATION N/A 11/17/2015   Procedure: Upper Extremity Angiography;  Surgeon: Wellington Hampshire, MD;  Location: [place] CV LAB;  Service: Cardiovascular;  Laterality: N/A;  . POLYPECTOMY  05/04/2017   Procedure: POLYPECTOMY;  Surgeon: [name], MD;  Location: [place];  Service: Endoscopy;;  FAMILY HISTORY :   Family History  Problem Relation Age of Onset  . Leukemia Mother     SOCIAL HISTORY:   Social History   Tobacco Use  . Smoking status: Former Smoker    Packs/day: 1.00    Years: 20.00    Pack years: 20.00    Types: Cigarettes    Last attempt to  quit: 08/30/2004    Years since quitting: 13.2  . Smokeless tobacco: Never Used  Substance Use Topics  . Alcohol use: No  . Drug use: No    ALLERGIES:  has No Known Allergies.  MEDICATIONS:  Current Outpatient Medications  Medication Sig Dispense Refill  . albuterol (PROVENTIL HFA;VENTOLIN HFA) 108 (90 Base) MCG/ACT inhaler Inhale 2 puffs into the lungs 4 (four) times daily as needed for wheezing or shortness of breath. 18 Inhaler 5  . dexlansoprazole (DEXILANT) 60 MG capsule Take 1 capsule (60 mg total) by mouth daily. 30 capsule 11  . diltiazem (CARDIZEM) 30 MG tablet Take 1 tablet (30 mg total) by mouth 3 (three) times daily. 90 tablet 3  . diltiazem (DILACOR XR) 120 MG 24 hr capsule Take 1 capsule (120 mg total) by mouth daily. 90 capsule 3  . enalapril (VASOTEC) 20 MG tablet TAKE 1 TABLET (20 MG TOTAL) BY MOUTH DAILY. 90 tablet 3  . fluticasone (FLONASE) 50 MCG/ACT nasal spray Place 2 sprays into both nostrils daily. 16 g 0  . Fluticasone-Salmeterol (ADVAIR DISKUS) 250-50 MCG/DOSE AEPB TAKE 1 PUFF BY MOUTH TWICE A DAY *RINSE MOUTH AFTER USE* 14 each 0  . hydrochlorothiazide (HYDRODIURIL) 25 MG tablet TAKE 1 TABLET BY MOUTH EVERY DAY 30 tablet 3  . hydroxychloroquine (PLAQUENIL) 200 MG tablet Take 400 mg by mouth 2 (two) times daily. Dr Delmer Islam    . meclizine (ANTIVERT) 25 MG tablet Take 1 tablet (25 mg total) by mouth 3 (three) times daily as needed for dizziness or nausea. 20 tablet 0  . meloxicam (MOBIC) 15 MG tablet Take 1 tablet (15 mg total) by mouth daily. 30 tablet 0  . metoprolol (LOPRESSOR) 50 MG tablet Take 75 mg by mouth 2 (two) times daily. Dr Delmer Islam    . Multiple Vitamin (MULTI-VITAMINS) TABS Take by mouth.    . mycophenolate (CELLCEPT) 500 MG tablet Take 1,000 mg by mouth 2 (two) times daily.   11  . pantoprazole (PROTONIX) 40 MG tablet TAKE 1 TABLET (40 MG TOTAL) BY MOUTH DAILY. 30 tablet 6  . potassium chloride SA (K-DUR,KLOR-CON) 20 MEQ tablet Take 1 tablet (20 mEq  total) by mouth daily. 90 tablet 3  . predniSONE (DELTASONE) 20 MG tablet TAKE 1 TABLET (20 MG TOTAL) BY MOUTH DAILY WITH BREAKFAST. (Patient taking differently: Take 10 mg by mouth daily with breakfast. ) 30 tablet 0  . triamcinolone cream (KENALOG) 0.5 % Apply topically.    . warfarin (COUMADIN) 5 MG tablet TAKE AS DIRECTED BY COUMADIN CLINIC 30 tablet 3  . folic acid (FOLVITE) 1 MG tablet Take 1 tablet (1 mg total) by mouth daily. (Patient not taking: Reported on 11/20/2017) 90 tablet 1   No current facility-administered medications for this visit.     PHYSICAL EXAMINATION:   BP (!) 155/93 (BP Location: Left Arm, Patient Position: Sitting)   Pulse 67   Temp (!) 97.3 F (36.3 C) (Tympanic)   Resp 16   Wt 208 lb 7.1 oz (94.6 kg)   BMI 29.91 kg/m   Filed Weights   11/20/17 0951  Weight: 208 lb  7.1 oz (94.6 kg)   GENERAL: Well-nourished well-developed; Alert, no distress and comfortable.  She is alone. EYES: no pallor or icterus OROPHARYNX: no thrush or ulceration; NECK: supple; no lymph nodes felt. LYMPH:  no palpable lymphadenopathy in the axillary or inguinal regions LUNGS: Decreased breath sounds auscultation bilaterally. No wheeze or crackles HEART/CVS: regular rate & rhythm and no murmurs; No lower extremity edema ABDOMEN:abdomen soft, non-tender and normal bowel sounds. No hepatomegaly or splenomegaly.  Musculoskeletal:no cyanosis of digits and no clubbing  PSYCH: alert & oriented x 3 with fluent speech NEURO: no focal motor/sensory deficits SKIN:  no rashes or significant lesions LABORATORY DATA:  I have reviewed the data as listed    Component Value Date/Time   NA 142 04/27/2017 1134   NA 138 12/11/2014 1518   NA 137 08/27/2014 0403   K 3.6 04/27/2017 1134   K 4.1 08/27/2014 0403   CL 106 04/27/2017 1134   CL 108 08/27/2014 0403   CO2 25 04/27/2017 1134   CO2 26 08/27/2014 0403   GLUCOSE 91 04/27/2017 1134   GLUCOSE 94 08/27/2014 0403   BUN 17 04/27/2017  1134   BUN 24 12/11/2014 1518   BUN 16 08/27/2014 0403   CREATININE 1.04 04/27/2017 1134   CREATININE 0.85 08/27/2014 0403   CALCIUM 9.3 04/27/2017 1134   CALCIUM 8.6 (L) 08/27/2014 0403   PROT 7.4 04/26/2017 1351   ALBUMIN 3.9 04/26/2017 1351   AST 24 04/26/2017 1351   ALT 29 04/26/2017 1351   ALKPHOS 50 04/26/2017 1351   BILITOT 0.4 04/26/2017 1351   GFRNONAA >60 04/27/2017 1134   GFRNONAA >60 08/27/2014 0403   GFRAA >60 04/27/2017 1134   GFRAA >60 08/27/2014 0403    No results found for: SPEP, UPEP  Lab Results  Component Value Date   WBC 9.3 04/27/2017   NEUTROABS 6.4 04/26/2017   HGB 13.0 11/20/2017   HCT 40.2 11/20/2017   MCV 85.7 04/27/2017   PLT 204 04/27/2017      Chemistry      Component Value Date/Time   NA 142 04/27/2017 1134   NA 138 12/11/2014 1518   NA 137 08/27/2014 0403   K 3.6 04/27/2017 1134   K 4.1 08/27/2014 0403   CL 106 04/27/2017 1134   CL 108 08/27/2014 0403   CO2 25 04/27/2017 1134   CO2 26 08/27/2014 0403   BUN 17 04/27/2017 1134   BUN 24 12/11/2014 1518   BUN 16 08/27/2014 0403   CREATININE 1.04 04/27/2017 1134   CREATININE 0.85 08/27/2014 0403      Component Value Date/Time   CALCIUM 9.3 04/27/2017 1134   CALCIUM 8.6 (L) 08/27/2014 0403   ALKPHOS 50 04/26/2017 1351   AST 24 04/26/2017 1351   ALT 29 04/26/2017 1351   BILITOT 0.4 04/26/2017 1351       RADIOGRAPHIC STUDIES: I have personally reviewed the radiological images as listed and agreed with the findings in the report. No results found.   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  #Hemoglobin today 13.  LDH normal.  No evidence of hemolysis.  #Diarrhea-status post extensive evaluation with GI at [facility].  No etiology found.  Patient concerned about folic acid causing diarrhea; this very  unlikely.  As patient is currently not hemolyzing I think it is okay to hold off folic acid.  #History of A. fib  on Coumadin.  # H&H/LDH in 3 months; follow up in CBC/LDH in 6 months.    No orders of the defined types were placed in this encounter.  All questions were answered. The patient knows to call the clinic with any problems, questions or concerns.      Cammie Sickle, MD 11/25/2017 2:22 PM

## 2017-11-20 NOTE — Assessment & Plan Note (Addendum)
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  #Hemoglobin today 13.  LDH normal.  No evidence of hemolysis.  #Diarrhea-status post extensive evaluation with GI at [facility].  No etiology found.  Patient concerned about folic acid causing diarrhea; this very unlikely.  As patient is currently not hemolyzing I think it is okay to hold off folic acid.  #History of A. fib on Coumadin.  # H&H/LDH in 3 months; follow up in CBC/LDH in 6 months.

## 2017-11-28 ENCOUNTER — Ambulatory Visit (INDEPENDENT_AMBULATORY_CARE_PROVIDER_SITE_OTHER): Payer: BLUE CROSS/BLUE SHIELD

## 2017-11-28 DIAGNOSIS — Z5181 Encounter for therapeutic drug level monitoring: Secondary | ICD-10-CM | POA: Diagnosis not present

## 2017-11-28 DIAGNOSIS — I483 Typical atrial flutter: Secondary | ICD-10-CM

## 2017-11-28 LAB — POCT INR: INR: 1.9 — AB (ref 2.0–3.0)

## 2017-11-28 NOTE — Patient Instructions (Signed)
Please START NEW DOSAGE 1/2 tablet daily except 1 tablet on Mondays, Wednesdays & Fridays.  Please be consistent with your green leafy vegetables and green tea.  Recheck in 4 weeks.

## 2017-12-12 ENCOUNTER — Other Ambulatory Visit: Payer: Self-pay | Admitting: Cardiovascular Disease

## 2017-12-12 NOTE — Telephone Encounter (Signed)
Please review for refill, Thanks !  

## 2017-12-19 ENCOUNTER — Other Ambulatory Visit: Payer: Self-pay | Admitting: Cardiovascular Disease

## 2017-12-26 ENCOUNTER — Ambulatory Visit: Payer: BLUE CROSS/BLUE SHIELD

## 2017-12-26 DIAGNOSIS — Z5181 Encounter for therapeutic drug level monitoring: Secondary | ICD-10-CM | POA: Diagnosis not present

## 2017-12-26 DIAGNOSIS — I483 Typical atrial flutter: Secondary | ICD-10-CM

## 2017-12-26 LAB — POCT INR: INR: 2.4 (ref 2.0–3.0)

## 2017-12-26 NOTE — Patient Instructions (Signed)
Please continue 1/2 tablet daily except 1 tablet on Mondays, Wednesdays & Fridays.  Please be consistent with your green leafy vegetables and green tea.  Recheck in 5 weeks.

## 2018-01-20 ENCOUNTER — Other Ambulatory Visit: Payer: Self-pay | Admitting: Cardiovascular Disease

## 2018-01-28 ENCOUNTER — Other Ambulatory Visit: Payer: Self-pay | Admitting: Cardiovascular Disease

## 2018-01-30 ENCOUNTER — Ambulatory Visit (INDEPENDENT_AMBULATORY_CARE_PROVIDER_SITE_OTHER): Payer: BLUE CROSS/BLUE SHIELD

## 2018-01-30 DIAGNOSIS — Z5181 Encounter for therapeutic drug level monitoring: Secondary | ICD-10-CM | POA: Diagnosis not present

## 2018-01-30 DIAGNOSIS — I483 Typical atrial flutter: Secondary | ICD-10-CM | POA: Diagnosis not present

## 2018-01-30 LAB — POCT INR: INR: 1.5 — AB (ref 2.0–3.0)

## 2018-01-30 NOTE — Patient Instructions (Signed)
Please take whole tablet today, then START NEW DOSAGE of 1 tablet daily except 1/2 tablet on Mondays, Wednesdays & Fridays.  Please be consistent with your green leafy vegetables and green tea.  Recheck in 4 weeks.

## 2018-02-19 ENCOUNTER — Inpatient Hospital Stay: Payer: BLUE CROSS/BLUE SHIELD | Attending: Internal Medicine

## 2018-02-19 DIAGNOSIS — D594 Other nonautoimmune hemolytic anemias: Secondary | ICD-10-CM | POA: Insufficient documentation

## 2018-02-19 LAB — HEMATOCRIT: HCT: 42.6 % (ref 40.0–52.0)

## 2018-02-19 LAB — LACTATE DEHYDROGENASE: LDH: 154 U/L (ref 98–192)

## 2018-02-19 LAB — HEMOGLOBIN: Hemoglobin: 13.9 g/dL (ref 13.0–18.0)

## 2018-02-27 ENCOUNTER — Telehealth: Payer: Self-pay | Admitting: Cardiovascular Disease

## 2018-02-27 ENCOUNTER — Ambulatory Visit: Payer: BLUE CROSS/BLUE SHIELD

## 2018-02-27 DIAGNOSIS — I483 Typical atrial flutter: Secondary | ICD-10-CM

## 2018-02-27 DIAGNOSIS — Z5181 Encounter for therapeutic drug level monitoring: Secondary | ICD-10-CM | POA: Diagnosis not present

## 2018-02-27 LAB — POCT INR: INR: 1.8 — AB (ref 2.0–3.0)

## 2018-02-27 NOTE — Patient Instructions (Signed)
Please  START NEW DOSAGE of 1 tablet daily except 1/2 tablet on Mondays  & Fridays.  Please be consistent with your green leafy vegetables and green tea.  Recheck in 3 weeks.

## 2018-02-27 NOTE — Telephone Encounter (Signed)
Recieved request from : [employer] for medical records    Scanned to Release  Forwarded to [employer] for processing

## 2018-03-05 ENCOUNTER — Telehealth: Payer: Self-pay | Admitting: Cardiovascular Disease

## 2018-03-05 NOTE — Telephone Encounter (Signed)
Recieved request from : [employer] for medical records    Scanned to Release  Forwarded to [employer] for processing

## 2018-03-15 NOTE — Telephone Encounter (Addendum)
Forms signed .  Mailed to [employer].

## 2018-03-15 NOTE — Telephone Encounter (Signed)
Received records request [employer], forwarded to [facility] for processing

## 2018-03-20 ENCOUNTER — Ambulatory Visit: Payer: BLUE CROSS/BLUE SHIELD

## 2018-03-20 DIAGNOSIS — I483 Typical atrial flutter: Secondary | ICD-10-CM | POA: Diagnosis not present

## 2018-03-20 DIAGNOSIS — Z5181 Encounter for therapeutic drug level monitoring: Secondary | ICD-10-CM

## 2018-03-20 LAB — POCT INR: INR: 2 (ref 2.0–3.0)

## 2018-03-20 NOTE — Patient Instructions (Signed)
Please take extra 1/2 tablet tonight, then resume dosage of 1 tablet daily except 1/2 tablet on Mondays  & Fridays.  Please be consistent with your green leafy vegetables and green tea.  Recheck in 4 weeks.

## 2018-04-01 NOTE — Telephone Encounter (Signed)
Recieved forms from [employer]    Placed in nurse box for md review.

## 2018-04-13 ENCOUNTER — Other Ambulatory Visit: Payer: Self-pay | Admitting: Cardiovascular Disease

## 2018-04-13 DIAGNOSIS — I10 Essential (primary) hypertension: Secondary | ICD-10-CM

## 2018-04-17 ENCOUNTER — Ambulatory Visit (INDEPENDENT_AMBULATORY_CARE_PROVIDER_SITE_OTHER): Payer: BLUE CROSS/BLUE SHIELD

## 2018-04-17 DIAGNOSIS — I483 Typical atrial flutter: Secondary | ICD-10-CM | POA: Diagnosis not present

## 2018-04-17 DIAGNOSIS — Z5181 Encounter for therapeutic drug level monitoring: Secondary | ICD-10-CM | POA: Diagnosis not present

## 2018-04-17 LAB — POCT INR: INR: 3.1 — AB (ref 2.0–3.0)

## 2018-04-17 NOTE — Patient Instructions (Signed)
Please have a large serving of greens today and continue dosage of 1 tablet daily except 1/2 tablet on Mondays  & Fridays.  Please be consistent with your green leafy vegetables and green tea.  Recheck in 4 weeks.

## 2018-04-29 ENCOUNTER — Other Ambulatory Visit: Payer: Self-pay | Admitting: Cardiovascular Disease

## 2018-04-29 DIAGNOSIS — I10 Essential (primary) hypertension: Secondary | ICD-10-CM

## 2018-05-01 ENCOUNTER — Other Ambulatory Visit: Payer: Self-pay | Admitting: Cardiovascular Disease

## 2018-05-03 ENCOUNTER — Other Ambulatory Visit: Payer: Self-pay | Admitting: Cardiovascular Disease

## 2018-05-08 NOTE — Progress Notes (Signed)
Cardiology Office Note  Date:  05/09/2018   ID:  [name], DOB Aug 19, 1960, MRN [id number]  PCP:  Nelwyn Salisbury, PA-C   Chief Complaint  Patient presents with  . other    12 month follow up. Meds reviewed by the pt. verbally. 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 Raul Del, stable  Blood pressure at home is normal Mildly elevated on today's visit but was rushing Review of previous notes shows well-controlled blood pressure  Lab work reviewed Total chol 163, LDL 81  EKG personally reviewed by myself on todays visit Shows normal sinus rhythm rate 61 bpm poor R wave progression through the anterior precordial leads, left axis deviation  Other past medical history reviewed On August 2017 he was in atrial flutter with rapid ventricular rate on anticoagulation, started on amiodarone converted to normal sinus rhythm, Amiodarone dose decreased  Prior history ofSores on fingers b/l, Severe symptoms on one of his fingers, nonhealing  ulcerations Improved following vascularsurgery December 2017  presented to the hospital 11/22/2015 with weakness, fall, hemoglobin of 5.5 Guiac negative, GI was consult and colonoscopy and EGD revealed no evidence of bleeding, Further evaluation by hematology confirmed hemolytic anemia, started on high-dose prednisone He did have transfusion 4  seen on a regular basis by Dr. Jefm Bryant for his mixed connective tissue disease Last visit January 2017 per the notes  Previous 30 day monitor that showed 3 episodes of PVCs in a bigeminal manner. He was asymptomatic. Arrhythmia sometimes in the morning, other times in the early evening. Was not on a consistent basis.  admitted to [facility] in April of 2016 for 3 week history of increased dyspnea and palpitations and found to be in new onset atrial flutter.  TTE showed EF 20-25%, global HK, possible bicuspid aortic valve.  successful TEE/DCCV on 4/1. 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   Repeat TTE showed improved EF of 55-60%, select images suggestive of hypokinesis of the inferior and posterior myocardium. LV diastolic parameters were normal. Left atrium was normal in size. RV function was normal. PASP was normal. He was advised further work up if he has symptoms.   On 11/27/14 while at work he suddenly developed onset of palpitations that led to increased SOB, especially with ambulation causing patient to leave work early. Patient checked his pulse and found it to be in the 140's.Pulse went back into the 60's to 80's without intervention.  Recurrent chest pain 7/5 he was woken up around 2 AM with left shoulder pain that radiated to  his left chest.  He presented to [facility] for evaluation. Troponin was found to be mildly elevated and flated trending 0.08-->0.05-->0.06-->0.07.  He underwent cardiac cath 48 hours which showed right  dominant system, no significant CAD, normal LV gram with EF >55%  the patient reports hand sensitivity to cold which started about 2 years ago and has been worsening. His fingers turn blue and her with significant pain when he is exposed to cold weather. He usually does better in the summertime. He developed an ulceration on the tip of the left small finger about 3 months ago which has gradually worsened with early gangrenous changes. No trauma.  he underwent upper extremity duplex which showed atypical flow in the ulnar artery and normal flow through the radial artery.  PMH:   has a past medical history of Anemia (10/2015), Atrial flutter ([place]), Gangrene of finger ([place]), GERD (gastroesophageal reflux disease), History of nuclear stress test, HTN (hypertension), Normal coronary arteries, Pulmonary fibrosis ([place]), RA (rheumatoid arthritis) ([place]), and Tachycardia-induced cardiomyopathy ([place]).  PSH:    Past Surgical History:  Procedure Laterality Date  . CARDIAC CATHETERIZATION N/A 12/03/2014   Procedure: Left Heart Cath and Coronary Angiography;  Surgeon: Minna Merritts, MD;  Location: [place] [facility];  Service: Cardiovascular;  Laterality: N/A;  . COLONOSCOPY WITH PROPOFOL N/A 11/23/2015   Procedure: COLONOSCOPY WITH PROPOFOL;  Surgeon: [name], MD;  Location: [facility];  Service: Endoscopy;  Laterality: N/A;  . COLONOSCOPY WITH PROPOFOL N/A 05/04/2017   Procedure: COLONOSCOPY WITH PROPOFOL;  Surgeon: [name], MD;  Location: [place];  Service: Endoscopy;  Laterality: N/A;  . ESOPHAGOGASTRODUODENOSCOPY (EGD) WITH PROPOFOL N/A 11/23/2015   Procedure: ESOPHAGOGASTRODUODENOSCOPY (EGD) WITH PROPOFOL;  Surgeon: [name], MD;  Location: [facility] END[facility];  Service: Endoscopy;  Laterality: N/A;  . HAND SURGERY Left   . PERIPHERAL VASCULAR CATHETERIZATION N/A 11/17/2015   Procedure: Upper Extremity Angiography;  Surgeon: Wellington Hampshire, MD;  Location: [place] [facility];   Service: Cardiovascular;  Laterality: N/A;  . POLYPECTOMY  05/04/2017   Procedure: POLYPECTOMY;  Surgeon: [name], MD;  Location: [place];  Service: Endoscopy;;    Current Outpatient Medications  Medication Sig Dispense Refill  . albuterol (PROVENTIL HFA;VENTOLIN HFA) 108 (90 Base) MCG/ACT inhaler Inhale 2 puffs into the lungs 4 (four) times daily as needed for wheezing or shortness of breath. 18 Inhaler 5  . diltiazem (CARDIZEM CD) 120 MG 24 hr capsule TAKE (1) CAPSULE BY MOUTH EVERY DAY 90 capsule 1  . enalapril (VASOTEC) 20 MG tablet TAKE 1 TABLET (20 MG TOTAL) BY MOUTH DAILY. 90 tablet 3  . fluticasone (FLONASE) 50 MCG/ACT nasal spray Place 2 sprays into both nostrils daily. 16 g 0  . Fluticasone-Salmeterol (ADVAIR DISKUS) 250-50 MCG/DOSE AEPB TAKE 1 PUFF BY MOUTH TWICE A DAY *RINSE MOUTH AFTER USE* 14 each 0  . folic acid (FOLVITE) 1 MG tablet Take 1 tablet (1 mg total) by mouth daily. 90 tablet 1  . hydroxychloroquine (PLAQUENIL) 200 MG tablet Take 400 mg by mouth 2 (two) times daily. Dr Delmer Islam    . KLOR-CON M20 20 MEQ tablet TAKE 1 TABLET BY MOUTH EVERY DAY 90 tablet 1  . meclizine (ANTIVERT) 25 MG tablet Take 1 tablet (25 mg total) by mouth 3 (three) times daily as needed for dizziness or nausea. 20 tablet 0  . meloxicam (MOBIC) 15 MG tablet Take 1 tablet (15 mg total) by mouth daily. 30 tablet 0  . metoprolol tartrate (LOPRESSOR) 50  MG tablet TAKE 1 AND 1/2 TABLETS BY MOUTH TWICE DAILY 45 tablet 0  . Multiple Vitamin (MULTI-VITAMINS) TABS Take by mouth.    . mycophenolate (CELLCEPT) 500 MG tablet Take 1,000 mg by mouth 2 (two) times daily.   11  . pantoprazole (PROTONIX) 40 MG tablet TAKE 1 TABLET (40 MG TOTAL) BY MOUTH DAILY. (Patient taking differently: Take 40 mg by mouth 2 (two) times daily. ) 30 tablet 6  . predniSONE (DELTASONE) 20 MG tablet TAKE 1 TABLET (20 MG TOTAL) BY MOUTH DAILY WITH BREAKFAST. (Patient taking differently: Take 10 mg by mouth daily with  breakfast. ) 30 tablet 0  . triamcinolone cream (KENALOG) 0.5 % Apply topically.    . warfarin (COUMADIN) 5 MG tablet TAKE AS DIRECTED BY COUMADIN CLINIC 90 tablet 1  . hydrochlorothiazide (HYDRODIURIL) 25 MG tablet TAKE 1 TABLET BY MOUTH EVERY DAY 30 tablet 11   No current facility-administered medications for this visit.      Allergies:   Patient has no known allergies.   Social History:  The patient  reports that he quit smoking about 13 years ago. His smoking use included cigarettes. He has a 20.00 pack-year smoking history. He has never used smokeless tobacco. He reports that he does not drink alcohol or use drugs.   Family History:   family history includes Leukemia in his mother.    Review of Systems: Review of Systems  Constitutional: Negative.   HENT: Negative.   Respiratory: Positive for shortness of breath.   Cardiovascular: Negative.   Gastrointestinal: Negative.   Musculoskeletal: Negative.   Neurological: Negative.   Psychiatric/Behavioral: Negative.   All other systems reviewed and are negative.   PHYSICAL EXAM: VS:  BP 140/90 (BP Location: Left Arm, Patient Position: Sitting, Cuff Size: Normal)   Pulse 61   Ht 5\' 10"  (1.778 m)   Wt 233 lb 8 oz (105.9 kg)   BMI 33.50 kg/m  , BMI Body mass index is 33.5 kg/m.  Constitutional:  oriented to person, place, and time. No distress.  HENT:  Head: Grossly normal Eyes:  no discharge. No scleral icterus.  Neck: No JVD, no carotid bruits  Cardiovascular: Regular rate and rhythm, no murmurs appreciated Pulmonary/Chest: Rales at the bases,  no wheezes Abdominal: Soft.  no distension.  no tenderness.  Musculoskeletal: Normal range of motion Neurological:  normal muscle tone. Coordination normal. No atrophy Skin: Skin warm and dry Psychiatric: normal affect, pleasant   Recent Labs: 02/19/2018: Hemoglobin 13.9    Lipid Panel Lab Results  Component Value Date   CHOL 88 08/25/2014   HDL 24 (L) 08/25/2014    LDLCALC 39 08/25/2014   TRIG 124 08/25/2014      Wt Readings from Last 3 Encounters:  05/09/18 233 lb 8 oz (105.9 kg)  11/20/17 208 lb 7.1 oz (94.6 kg)  07/31/17 221 lb 10.8 oz (100.5 kg)     ASSESSMENT AND PLAN:   Essential hypertension - Plan: EKG 12-Lead Blood pressure is well controlled in general and on previous notes  no changes made to the medications.  Frequent PVCs - Plan: EKG 12-Lead continue beta blockers, asymptomatic, no further work-up  Shortness of breath - Plan: EKG 12-Lead Pulmonary fibrosis, followed by pulmonary on oxygen He denies significant progression  Typical atrial flutter (HCC) amiodarone stopped secondary to underlying lung disease Tolerating anticoagulation  maintaining normal sinus rhythm  Pulmonary fibrosis (HCC) Currently on oxygen by nasal cannula, on room air on today's visit Followed by Dr. Raul Del  Encounter for anticoagulation discussion and counseling No recent bleeding, tolerating warfarin  Disposition:   F/U  12 months   Total encounter time more than 25 minutes  Greater than 50% was spent in counseling and coordination of care with the patient    Orders Placed This Encounter  Procedures  . EKG 12-Lead     Signed, Esmond Plants, M.D., Ph.D. 05/09/2018  [place], [place]

## 2018-05-09 ENCOUNTER — Encounter: Payer: Self-pay | Admitting: Cardiovascular Disease

## 2018-05-09 ENCOUNTER — Other Ambulatory Visit: Payer: Self-pay | Admitting: Cardiovascular Disease

## 2018-05-09 ENCOUNTER — Ambulatory Visit (INDEPENDENT_AMBULATORY_CARE_PROVIDER_SITE_OTHER): Payer: BLUE CROSS/BLUE SHIELD | Admitting: Cardiovascular Disease

## 2018-05-09 VITALS — BP 140/90 | HR 61 | Ht 70.0 in | Wt 233.5 lb

## 2018-05-09 DIAGNOSIS — I483 Typical atrial flutter: Secondary | ICD-10-CM

## 2018-05-09 DIAGNOSIS — I493 Ventricular premature depolarization: Secondary | ICD-10-CM

## 2018-05-09 DIAGNOSIS — I10 Essential (primary) hypertension: Secondary | ICD-10-CM | POA: Diagnosis not present

## 2018-05-09 DIAGNOSIS — I43 Cardiomyopathy in diseases classified elsewhere: Secondary | ICD-10-CM | POA: Diagnosis not present

## 2018-05-09 DIAGNOSIS — R000 Tachycardia, unspecified: Secondary | ICD-10-CM

## 2018-05-09 DIAGNOSIS — J8410 Pulmonary fibrosis, unspecified: Secondary | ICD-10-CM | POA: Diagnosis not present

## 2018-05-09 DIAGNOSIS — R0602 Shortness of breath: Secondary | ICD-10-CM

## 2018-05-09 NOTE — Patient Instructions (Signed)

## 2018-05-14 ENCOUNTER — Other Ambulatory Visit: Payer: Self-pay

## 2018-05-14 MED ORDER — METOPROLOL TARTRATE 50 MG PO TABS: 75.0000 mg | ORAL_TABLET | Freq: Two times a day (BID) | ORAL | 0 refills | Status: DC

## 2018-05-14 NOTE — Telephone Encounter (Signed)
Requested Prescriptions   Signed Prescriptions Disp Refills  . metoprolol tartrate (LOPRESSOR) 50 MG tablet 270 tablet 0    Sig: Take 1.5 tablets (75 mg total) by mouth 2 (two) times daily.    Authorizing Provider: Minna Merritts    Ordering User: Janan Ridge

## 2018-05-15 ENCOUNTER — Ambulatory Visit (INDEPENDENT_AMBULATORY_CARE_PROVIDER_SITE_OTHER): Payer: BLUE CROSS/BLUE SHIELD

## 2018-05-15 DIAGNOSIS — I483 Typical atrial flutter: Secondary | ICD-10-CM | POA: Diagnosis not present

## 2018-05-15 DIAGNOSIS — Z5181 Encounter for therapeutic drug level monitoring: Secondary | ICD-10-CM

## 2018-05-15 LAB — POCT INR: INR: 1.8 — AB (ref 2.0–3.0)

## 2018-05-15 NOTE — Patient Instructions (Signed)
Please take extra 1/2 tablet today, then continue dosage of 1 tablet daily except 1/2 tablet on Mondays  & Fridays.  Please be consistent with your green leafy vegetables and green tea.  Recheck in 4 weeks.

## 2018-05-23 ENCOUNTER — Other Ambulatory Visit: Payer: Self-pay | Admitting: Cardiovascular Disease

## 2018-05-23 NOTE — Telephone Encounter (Signed)
*  STAT* If patient is at the pharmacy, call can be transferred to refill team.   1. Which medications need to be refilled? (please list name of each medication and dose if known) K[place]-Con 20 MEQ  2. Which pharmacy/location (including street and city if local pharmacy) is medication to be sent to? [facility] [place]  3. Do they need a 30 day or 90 day supply? [place]

## 2018-05-28 ENCOUNTER — Inpatient Hospital Stay: Payer: BLUE CROSS/BLUE SHIELD | Attending: Internal Medicine | Admitting: Internal Medicine

## 2018-05-28 ENCOUNTER — Inpatient Hospital Stay: Payer: BLUE CROSS/BLUE SHIELD

## 2018-05-28 ENCOUNTER — Encounter: Payer: Self-pay | Admitting: Internal Medicine

## 2018-05-28 VITALS — BP 138/92 | HR 69 | Temp 97.8°F | Resp 16 | Wt 232.7 lb

## 2018-05-28 DIAGNOSIS — Z7901 Long term (current) use of anticoagulants: Secondary | ICD-10-CM | POA: Insufficient documentation

## 2018-05-28 DIAGNOSIS — I10 Essential (primary) hypertension: Secondary | ICD-10-CM | POA: Insufficient documentation

## 2018-05-28 DIAGNOSIS — I4891 Unspecified atrial fibrillation: Secondary | ICD-10-CM | POA: Insufficient documentation

## 2018-05-28 DIAGNOSIS — Z87891 Personal history of nicotine dependence: Secondary | ICD-10-CM | POA: Insufficient documentation

## 2018-05-28 DIAGNOSIS — D594 Other nonautoimmune hemolytic anemias: Secondary | ICD-10-CM

## 2018-05-28 DIAGNOSIS — Z79899 Other long term (current) drug therapy: Secondary | ICD-10-CM | POA: Diagnosis not present

## 2018-05-28 DIAGNOSIS — D591 Other autoimmune hemolytic anemias: Secondary | ICD-10-CM | POA: Insufficient documentation

## 2018-05-28 LAB — HEMATOCRIT: HCT: 44.2 % (ref 39.0–52.0)

## 2018-05-28 LAB — HEMOGLOBIN: Hemoglobin: 13.8 g/dL (ref 13.0–17.0)

## 2018-05-28 LAB — LACTATE DEHYDROGENASE: LDH: 157 U/L (ref 98–192)

## 2018-05-28 NOTE — Assessment & Plan Note (Signed)
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  #Hemoglobin today 13.  LDH normal.  No evidence of hemolysis.STABLE.   #Diarrhea-status post extensive evaluation with GI at [facility]. Improved/resolved.   #History of A. fib on Coumadin- STABLE.   # DISPOSITION: #  H&H/LDH in 3 months [[place]];  # follow up-MD/[place]-CBC/LDH in 6 months-dr.B

## 2018-05-28 NOTE — Progress Notes (Signed)
[place] OFFICE PROGRESS NOTE  Patient Care Team: Nelwyn Salisbury, Hershal Coria as PCP - General (Physician Assistant) Minna Merritts, MD as Consulting Physician (Cardiology)    No history exists.   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  male pleasant patient above history of Autoimmune hemolytic anemia- currently on prednisone 10 mg/CellCept 500 twice a day [also for Autoimmune/connective tissue disorder]- is here for follow-up.   Patient denies any worsening fatigue or blood in stools or black or stools.  He continues to be on Coumadin for his A. Fib.  Concerned about ongoing weight gain.  Denies any swelling in the legs.  Review of Systems  Constitutional: Negative for chills, diaphoresis, fever, malaise/fatigue and weight loss.  HENT: Negative for nosebleeds and sore throat.   Eyes: Negative for double vision.  Respiratory: Negative for cough, hemoptysis, sputum production, shortness of breath and wheezing.   Cardiovascular: Negative for chest pain, palpitations, orthopnea and leg swelling.  Gastrointestinal: Negative for abdominal pain, blood in stool, constipation, diarrhea, heartburn, melena, nausea and vomiting.  Genitourinary: Negative for dysuria, frequency and urgency.  Musculoskeletal: Negative for back pain and joint pain.  Skin: Negative.  Negative for itching and rash.  Neurological: Negative for dizziness, tingling, focal weakness, weakness and headaches.  Endo/Heme/Allergies: Does not bruise/bleed easily.  Psychiatric/Behavioral: Negative for depression. The patient is not  nervous/anxious and does not have insomnia.      PAST MEDICAL HISTORY :  Past Medical History:  Diagnosis Date  . Anemia 10/2015  . Atrial flutter ([place])    a. on Xarelto; b. s/p successful TEE/DCCV on 08/28/2014  . Gangrene of finger ([place])   . GERD (gastroesophageal reflux disease)   . History of nuclear stress test    a. 09/02/2014: no sig ischemia, GI uptake noted, no sig WMA, EF 48%, no EKG chanes concerning for ischemia, low risk scan    . HTN (hypertension)   . Normal coronary arteries    a. cardiac cath 12/03/2014: no sigificant CAD, right dominant system, LVEF >55%, no MR or AS  . Pulmonary fibrosis ([place])    a. not on home oxygen  . RA (rheumatoid arthritis) ([place])   . 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 echo 07/2014: EF 20-25%, cannot exclude atrial flutter,  global HK, possible bicuspid Ao valve, mod reduced RV sys fxn, mild-mod aortic valve sclerosis/calcification, mod TR, mildly elevated RVSP; b. TEE 08/28/2014: EF 30-35%, no intracardic thrombus, mildly dilated LA/RA, mild TR, mild-mod aortic sclerosis w/o stenosis; c. echo 09/2014: EF 55-60%, select images w/ inf HK    PAST SURGICAL HISTORY :   Past Surgical History:  Procedure Laterality Date  . CARDIAC CATHETERIZATION N/A 12/03/2014   Procedure: Left Heart Cath and Coronary Angiography;  Surgeon: Minna Merritts, MD;  Location: [facility];  Service: Cardiovascular;  Laterality: N/A;  . COLONOSCOPY WITH PROPOFOL N/A 11/23/2015   Procedure: COLONOSCOPY WITH PROPOFOL;  Surgeon: [name], MD;  Location: [facility];  Service: Endoscopy;  Laterality: N/A;  . COLONOSCOPY WITH PROPOFOL N/A 05/04/2017   Procedure: COLONOSCOPY WITH PROPOFOL;  Surgeon: [name], MD;  Location: [facility];  Service: Endoscopy;  Laterality: N/A;  .  ESOPHAGOGASTRODUODENOSCOPY (EGD) WITH PROPOFOL N/A 11/23/2015   Procedure: ESOPHAGOGASTRODUODENOSCOPY (EGD) WITH PROPOFOL;  Surgeon: [name], MD;  Location: [facility];   Service: Endoscopy;  Laterality: N/A;  . HAND SURGERY Left   . PERIPHERAL VASCULAR CATHETERIZATION N/A 11/17/2015   Procedure: Upper Extremity Angiography;  Surgeon: Wellington Hampshire, MD;  Location: [facility];  Service: Cardiovascular;  Laterality: N/A;  . POLYPECTOMY  05/04/2017   Procedure: POLYPECTOMY;  Surgeon: [name], MD;  Location: [facility];  Service: Endoscopy;;    FAMILY HISTORY :   Family History  Problem Relation Age of Onset  . Leukemia Mother     SOCIAL HISTORY:   Social History   Tobacco Use  . Smoking status: Former Smoker    Packs/day: 1.00    Years: 20.00    Pack years: 20.00    Types: Cigarettes    Last attempt to quit: 08/30/2004    Years since quitting: 13.7  . Smokeless tobacco: Never Used  Substance Use Topics  . Alcohol use: No  . Drug use: No    ALLERGIES:  has No Known Allergies.  MEDICATIONS:  Current Outpatient Medications  Medication Sig Dispense Refill  . albuterol (PROVENTIL HFA;VENTOLIN HFA) 108 (90 Base) MCG/ACT inhaler Inhale 2 puffs into the lungs 4 (four) times daily as needed for wheezing or shortness of breath. 18 Inhaler 5  . diltiazem (CARDIZEM CD) 120 MG 24 hr capsule TAKE (1) CAPSULE BY MOUTH EVERY DAY 90 capsule 1  . enalapril (VASOTEC) 20 MG tablet TAKE 1 TABLET (20 MG TOTAL) BY MOUTH DAILY. 90 tablet 3  . fluticasone (FLONASE) 50 MCG/ACT nasal spray Place 2 sprays into both nostrils daily. 16 g 0  . Fluticasone-Salmeterol (ADVAIR DISKUS) 250-50 MCG/DOSE AEPB TAKE 1 PUFF BY MOUTH TWICE A DAY *RINSE MOUTH AFTER USE* 14 each 0  . folic acid (FOLVITE) 1 MG tablet Take 1 tablet (1 mg total) by mouth daily. 90 tablet 1  . hydrochlorothiazide (HYDRODIURIL) 25 MG tablet TAKE 1 TABLET BY MOUTH EVERY DAY 30 tablet 11  . hydroxychloroquine (PLAQUENIL) 200 MG tablet Take 400 mg by mouth 2 (two) times daily. Dr Delmer Islam    . KLOR-CON M20 20 MEQ tablet TAKE 1 TABLET BY MOUTH EVERY DAY 90 tablet 0  . meclizine (ANTIVERT)  25 MG tablet Take 1 tablet (25 mg total) by mouth 3 (three) times daily as needed for dizziness or nausea. 20 tablet 0  . meloxicam (MOBIC) 15 MG tablet Take 1 tablet (15 mg total) by mouth daily. 30 tablet 0  . metoprolol tartrate (LOPRESSOR) 50 MG tablet Take 1.5 tablets (75 mg total) by mouth 2 (two) times daily. 270 tablet 0  . Multiple Vitamin (MULTI-VITAMINS) TABS Take by mouth.    . mycophenolate (CELLCEPT) 500 MG tablet Take 1,000 mg by mouth 2 (two) times daily.   11  . pantoprazole (PROTONIX) 40 MG tablet TAKE 1 TABLET (40 MG TOTAL) BY MOUTH DAILY. (Patient taking differently: Take 40 mg by mouth 2 (two) times daily. ) 30 tablet 6  . predniSONE (DELTASONE) 20 MG tablet TAKE 1 TABLET (20 MG TOTAL) BY MOUTH DAILY WITH BREAKFAST. (Patient taking differently: Take 10 mg by mouth daily with breakfast. ) 30 tablet 0  . triamcinolone cream (KENALOG) 0.5 % Apply topically.    . warfarin (COUMADIN) 5 MG tablet TAKE AS DIRECTED BY COUMADIN CLINIC 90 tablet 1   No current facility-administered medications for this visit.     PHYSICAL EXAMINATION:  BP (!) 138/92 (BP Location: Left Arm, Patient Position: Sitting)   Pulse 69   Temp 97.8 F (36.6 C) (Tympanic)   Resp 16   Wt 232 lb 11.1 oz (105.6 kg)   BMI 33.39 kg/m   Filed Weights   05/28/18 0935  Weight: 232 lb 11.1 oz (105.6 kg)   Physical Exam  Constitutional: He is oriented to person, place, and time and well-developed, well-nourished, and in no distress.  HENT:  Head: Normocephalic and atraumatic.  Mouth/Throat: Oropharynx is clear and moist. No oropharyngeal exudate.  Eyes: Pupils are equal, round, and reactive to light.  Neck: Normal range of motion. Neck supple.  Cardiovascular: Normal rate and regular rhythm.  Pulmonary/Chest: No respiratory distress. He has no wheezes.  Abdominal: Soft. Bowel sounds are normal. He exhibits no distension and no mass. There is no abdominal tenderness. There is no rebound and no guarding.   Musculoskeletal: Normal range of motion.        General: No tenderness or edema.  Neurological: He is alert and oriented to person, place, and time.  Skin: Skin is warm.  Psychiatric: Affect normal.    LABORATORY DATA:  I have reviewed the data as listed    Component Value Date/Time   NA 142 04/27/2017 1134   NA 138 12/11/2014 1518   NA 137 08/27/2014 0403   K 3.6 04/27/2017 1134   K 4.1 08/27/2014 0403   CL 106 04/27/2017 1134   CL 108 08/27/2014 0403   CO2 25 04/27/2017 1134   CO2 26 08/27/2014 0403   GLUCOSE 91 04/27/2017 1134   GLUCOSE 94 08/27/2014 0403   BUN 17 04/27/2017 1134   BUN 24 12/11/2014 1518   BUN 16 08/27/2014 0403   CREATININE 1.04 04/27/2017 1134   CREATININE 0.85 08/27/2014 0403   CALCIUM 9.3 04/27/2017 1134   CALCIUM 8.6 (L) 08/27/2014 0403   PROT 7.4 04/26/2017 1351   ALBUMIN 3.9 04/26/2017 1351   AST 24 04/26/2017 1351   ALT 29 04/26/2017 1351   ALKPHOS 50 04/26/2017 1351   BILITOT 0.4 04/26/2017 1351   GFRNONAA >60 04/27/2017 1134   GFRNONAA >60 08/27/2014 0403   GFRAA >60 04/27/2017 1134   GFRAA >60 08/27/2014 0403    No results found for: SPEP, UPEP  Lab Results  Component Value Date   WBC 9.3 04/27/2017   NEUTROABS 6.4 04/26/2017   HGB 13.8 05/28/2018   HCT 44.2 05/28/2018   MCV 85.7 04/27/2017   PLT 204 04/27/2017      Chemistry      Component Value Date/Time   NA 142 04/27/2017 1134   NA 138 12/11/2014 1518   NA 137 08/27/2014 0403   K 3.6 04/27/2017 1134   K 4.1 08/27/2014 0403   CL 106 04/27/2017 1134   CL 108 08/27/2014 0403   CO2 25 04/27/2017 1134   CO2 26 08/27/2014 0403   BUN 17 04/27/2017 1134   BUN 24 12/11/2014 1518   BUN 16 08/27/2014 0403   CREATININE 1.04 04/27/2017 1134   CREATININE 0.85 08/27/2014 0403      Component Value Date/Time   CALCIUM 9.3 04/27/2017 1134   CALCIUM 8.6 (L) 08/27/2014 0403   ALKPHOS 50 04/26/2017 1351   AST 24 04/26/2017 1351   ALT 29 04/26/2017 1351   BILITOT 0.4  04/26/2017 1351       RADIOGRAPHIC STUDIES: I have personally reviewed the radiological images as listed and agreed with the findings in the report. No results found.  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  #Hemoglobin today 13.  LDH normal.  No evidence of hemolysis.STABLE.   #Diarrhea-status post extensive evaluation with GI at [facility]. Improved/resolved.   #History of A. fib on Coumadin- STABLE.   # DISPOSITION: #  H&H/LDH in 3 months [[place]];  # follow up-MD/[place]-CBC/LDH in 6 months-dr.B    No orders of the defined types were placed in this encounter.  All questions were answered. The patient knows to call the clinic with any problems, questions or concerns.      Cammie Sickle, MD 05/28/2018 10:21 AM

## 2018-06-03 ENCOUNTER — Other Ambulatory Visit: Payer: Self-pay | Admitting: Cardiovascular Disease

## 2018-06-03 NOTE — Telephone Encounter (Signed)
Refill Request.  

## 2018-06-12 ENCOUNTER — Ambulatory Visit (INDEPENDENT_AMBULATORY_CARE_PROVIDER_SITE_OTHER): Payer: BLUE CROSS/BLUE SHIELD

## 2018-06-12 DIAGNOSIS — I483 Typical atrial flutter: Secondary | ICD-10-CM

## 2018-06-12 DIAGNOSIS — Z5181 Encounter for therapeutic drug level monitoring: Secondary | ICD-10-CM | POA: Diagnosis not present

## 2018-06-12 LAB — POCT INR: INR: 2.7 (ref 2.0–3.0)

## 2018-06-12 NOTE — Patient Instructions (Signed)
Please continue dosage of 1 tablet daily except 1/2 tablet on Mondays  & Fridays.  Please be consistent with your green leafy vegetables and green tea.  Recheck in 5 weeks.

## 2018-07-09 ENCOUNTER — Emergency Department: Payer: BLUE CROSS/BLUE SHIELD

## 2018-07-09 ENCOUNTER — Emergency Department
Admission: EM | Admit: 2018-07-09 | Discharge: 2018-07-09 | Disposition: A | Discharge status: Home / Self Care | Payer: BLUE CROSS/BLUE SHIELD | Attending: Emergency Medicine | Admitting: Emergency Medicine

## 2018-07-09 ENCOUNTER — Other Ambulatory Visit: Payer: Self-pay

## 2018-07-09 DIAGNOSIS — Z7901 Long term (current) use of anticoagulants: Secondary | ICD-10-CM | POA: Diagnosis not present

## 2018-07-09 DIAGNOSIS — M79641 Pain in right hand: Secondary | ICD-10-CM | POA: Diagnosis present

## 2018-07-09 DIAGNOSIS — Z87891 Personal history of nicotine dependence: Secondary | ICD-10-CM | POA: Insufficient documentation

## 2018-07-09 DIAGNOSIS — M109 Gout, unspecified: Secondary | ICD-10-CM | POA: Diagnosis not present

## 2018-07-09 DIAGNOSIS — I10 Essential (primary) hypertension: Secondary | ICD-10-CM | POA: Diagnosis not present

## 2018-07-09 DIAGNOSIS — I4892 Unspecified atrial flutter: Secondary | ICD-10-CM | POA: Insufficient documentation

## 2018-07-09 DIAGNOSIS — I429 Cardiomyopathy, unspecified: Secondary | ICD-10-CM | POA: Diagnosis not present

## 2018-07-09 DIAGNOSIS — Z79899 Other long term (current) drug therapy: Secondary | ICD-10-CM | POA: Insufficient documentation

## 2018-07-09 LAB — CBC WITH DIFFERENTIAL/PLATELET
Abs Immature Granulocytes: 0.23 10*3/uL — ABNORMAL HIGH (ref 0.00–0.07)
Basophils Absolute: 0 10*3/uL (ref 0.0–0.1)
Basophils Relative: 0 %
EOS ABS: 0.1 10*3/uL (ref 0.0–0.5)
Eosinophils Relative: 1 %
HCT: 43.4 % (ref 39.0–52.0)
Hemoglobin: 13.5 g/dL (ref 13.0–17.0)
Immature Granulocytes: 2 %
Lymphocytes Relative: 6 %
Lymphs Abs: 0.8 10*3/uL (ref 0.7–4.0)
MCH: 26.9 pg (ref 26.0–34.0)
MCHC: 31.1 g/dL (ref 30.0–36.0)
MCV: 86.5 fL (ref 80.0–100.0)
Monocytes Absolute: 1.1 10*3/uL — ABNORMAL HIGH (ref 0.1–1.0)
Monocytes Relative: 8 %
NRBC: 0 % (ref 0.0–0.2)
Neutro Abs: 11.9 10*3/uL — ABNORMAL HIGH (ref 1.7–7.7)
Neutrophils Relative %: 83 %
Platelets: 223 10*3/uL (ref 150–400)
RBC: 5.02 MIL/uL (ref 4.22–5.81)
RDW: 13.5 % (ref 11.5–15.5)
WBC: 14.1 10*3/uL — ABNORMAL HIGH (ref 4.0–10.5)

## 2018-07-09 LAB — BASIC METABOLIC PANEL
Anion gap: 9 (ref 5–15)
BUN: 20 mg/dL (ref 6–20)
CALCIUM: 9.3 mg/dL (ref 8.9–10.3)
CO2: 29 mmol/L (ref 22–32)
Chloride: 103 mmol/L (ref 98–111)
Creatinine, Ser: 0.92 mg/dL (ref 0.61–1.24)
GFR calc Af Amer: >60 mL/min (ref >60)
GFR calc non Af Amer: >60 mL/min (ref >60)
Glucose, Bld: 118 mg/dL — ABNORMAL HIGH (ref 70–99)
Potassium: 3.7 mmol/L (ref 3.5–5.1)
Sodium: 141 mmol/L (ref 135–145)

## 2018-07-09 LAB — URIC ACID: Uric Acid, Serum: 9.5 mg/dL — ABNORMAL HIGH (ref 3.7–8.6)

## 2018-07-09 LAB — PROTIME-INR
INR: 2.35
Prothrombin Time: 25.4 seconds — ABNORMAL HIGH (ref 11.4–15.2)

## 2018-07-09 MED ORDER — PREDNISONE 10 MG PO TABS: ORAL_TABLET | ORAL | 0 refills | Status: DC

## 2018-07-09 MED ORDER — COLCHICINE 0.6 MG PO TABS: 0.6000 mg | ORAL_TABLET | Freq: Two times a day (BID) | ORAL | 2 refills | Status: DC

## 2018-07-09 MED ORDER — OXYCODONE-ACETAMINOPHEN 5-325 MG PO TABS
2.0000 | ORAL_TABLET | Freq: Once | ORAL | Status: AC
  Administered 2018-07-09: 2 via ORAL
  Filled 2018-07-09: qty 2

## 2018-07-09 MED ORDER — HYDROCODONE-ACETAMINOPHEN 5-325 MG PO TABS: 2.0000 | ORAL_TABLET | Freq: Four times a day (QID) | ORAL | 0 refills | Status: DC | PRN

## 2018-07-09 MED ORDER — DOCUSATE SODIUM 100 MG PO CAPS: ORAL_CAPSULE | ORAL | 0 refills | Status: DC

## 2018-07-09 NOTE — Discharge Instructions (Addendum)
We believe the pain and swelling you have in your right wrist and hand are due to gout.  Please take the prescribed medication as written.  Only take the narcotic pain medicine if absolutely necessary for pain control.  Follow-up with a phone call to the orthopedics clinic today to see if you can schedule an appointment with Dr. Jackqulyn Livings on Thursday, or if she is unavailable, with 1 of her colleagues on Thursday or Friday.  Use the removable wrist splint as needed for comfort.  Read through the included information and return to the emergency department if you develop new or worsening symptoms that concern you.

## 2018-07-09 NOTE — ED Provider Notes (Signed)
[facility] Emergency Department Provider Note  ____________________________________________   First MD Initiated Contact with Patient 07/09/18 0522     (approximate)  I have reviewed the triage vital signs and the nursing notes.   HISTORY  Chief Complaint Hand Pain    HPI [name] is a 58 y.o. male with medical history as listed below which notably includes rheumatological issues followed by Dr. Jefm Bryant, warfarin due to persistent atrial flutter, and other cardiac issues as listed below.  He presents for evaluation of pain in his right hand and wrist.  It is been gradually worsening over the last 48 hours.  There is some swelling starting at the wrist and moving distally throughout the hand.  There is no one particular area that is affected worse than the other although it seems to be localized at the wrist.  He has had no traumatic injury and no penetrating injury.  He has no wounds on the hand or wrist.  He denies fever/chills, chest pain, shortness of breath, nausea, vomiting, and abdominal pain.  He has had no recent medication changes.  He has no history of gout.  Flexing extending the wrist as well as touching and using the hand reproduce the pain and make it worse.  Nothing in particular makes it better.  Past Medical History:  Diagnosis Date  . Anemia 10/2015  . Atrial flutter ([place])    a. on Xarelto; b. s/p successful TEE/DCCV on 08/28/2014  . Gangrene of finger ([place])   . GERD (gastroesophageal reflux disease)   . History of nuclear stress test    a. 09/02/2014: no sig ischemia, GI uptake noted, no sig WMA, EF 48%, no EKG chanes concerning for ischemia, low risk scan    . HTN (hypertension)   . Normal coronary arteries    a. cardiac cath 12/03/2014: no sigificant CAD, right dominant system, LVEF >55%, no MR or AS  . Pulmonary fibrosis ([place])    a. not on home oxygen  . RA (rheumatoid arthritis) ([place])   . 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 echo 07/2014: EF 20-25%, cannot exclude atrial flutter,  global HK, possible bicuspid Ao valve, mod reduced RV sys fxn, mild-mod aortic valve sclerosis/calcification, mod TR, mildly elevated RVSP; b. TEE 08/28/2014: EF 30-35%, no intracardic thrombus, mildly dilated LA/RA, mild TR, mild-mod aortic sclerosis w/o stenosis; c. echo 09/2014: EF 55-60%, select images w/ inf HK    Patient Active Problem List   Diagnosis Date Noted  . Noninfectious diarrhea   . Polyp of sigmoid colon   . Encounter for therapeutic drug monitoring 09/15/2016  . Encounter for anticoagulation discussion and counseling 08/29/2016  . Carpal tunnel syndrome 08/08/2016  . Degenerative disc disease 08/08/2016  . Cerebrovascular accident (CVA) ([place]) 07/08/2016  . Trigeminal neuralgia 07/08/2016  . 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 Ischemia of hand 04/19/2016  . Primary hypercoagulable state ([place]) 04/19/2016  . Sarcoidosis 04/19/2016  . Gangrene of finger ([place]) 04/03/2016  . Vasculitis ([place]) 04/03/2016  . Shortness of breath 12/31/2015  . 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 Hemorrhagic shock ([place]) 11/25/2015  . Arrhythmia 11/25/2015  . Absolute anemia   . Weakness   . Hemolytic anemia ([place])   . Iron deficiency anemia, unspecified   . Hernia, hiatal   . Acute esophagitis   . 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 Normal coronary arteries   . Chest pain 12/01/2014  . Typical atrial flutter ([place])   . Tachycardia-induced cardiomyopathy ([place])   .  Essential hypertension   . RA (rheumatoid arthritis) ([place])   . Pulmonary fibrosis ([place])   . Atrial flutter ([place])   . Nontoxic uninodular goiter 04/20/2014  . Mixed connective tissue disease ([place]) 02/03/2014  . Mediastinal adenopathy 11/02/2013    Past Surgical History:  Procedure Laterality Date  . CARDIAC CATHETERIZATION N/A 12/03/2014   Procedure: Left Heart Cath and Coronary Angiography;  Surgeon: Minna Merritts, MD;  Location: [facility];  Service: Cardiovascular;  Laterality: N/A;  . COLONOSCOPY WITH PROPOFOL N/A 11/23/2015   Procedure: COLONOSCOPY WITH PROPOFOL;  Surgeon: [name], MD;  Location: [facility] ENDOSCOPY;  Service: Endoscopy;  Laterality: N/A;  . COLONOSCOPY WITH PROPOFOL N/A 05/04/2017   Procedure: COLONOSCOPY WITH PROPOFOL;  Surgeon: [name], MD;  Location: [facility];  Service: Endoscopy;  Laterality: N/A;  . ESOPHAGOGASTRODUODENOSCOPY (EGD) WITH PROPOFOL N/A 11/23/2015   Procedure: ESOPHAGOGASTRODUODENOSCOPY (EGD) WITH PROPOFOL;  Surgeon: [name], MD;  Location: [facility] ENDOSCOPY;  Service: Endoscopy;  Laterality: N/A;  . HAND SURGERY Left   . PERIPHERAL VASCULAR CATHETERIZATION N/A 11/17/2015   Procedure: Upper Extremity Angiography;  Surgeon: Wellington Hampshire, MD;  Location: [facility];  Service: Cardiovascular;  Laterality: N/A;  . POLYPECTOMY  05/04/2017   Procedure: POLYPECTOMY;  Surgeon: [name], MD;  Location: [facility];  Service: Endoscopy;;    Prior to Admission medications   Medication Sig Start Date End Date Taking? Authorizing Provider  albuterol (PROVENTIL HFA;VENTOLIN HFA) 108 (90 Base) MCG/ACT inhaler Inhale 2 puffs into the lungs 4 (four) times daily as needed for wheezing or shortness of breath. 05/21/17   Juline Patch, MD  colchicine 0.6 MG tablet Take 1 tablet (0.6 mg total) by mouth 2 (two) times daily. Stop taking the medication two days after your symptoms resolve, or as directed by a physician. 07/09/18 07/09/19  Hinda Kehr, MD  diltiazem (CARDIZEM CD) 120 MG 24 hr capsule TAKE (1) CAPSULE BY MOUTH EVERY DAY 12/19/17   Minna Merritts, MD  docusate sodium (COLACE) 100 MG capsule Take 1 tablet once or twice daily as needed for constipation while taking narcotic pain medicine 07/09/18   Hinda Kehr, MD  enalapril (VASOTEC) 20 MG tablet TAKE 1 TABLET (20 MG TOTAL) BY MOUTH DAILY. 09/27/17   Minna Merritts, MD  fluticasone (FLONASE) 50 MCG/ACT nasal  spray Place 2 sprays into both nostrils daily. 06/10/15   Lorin Picket, PA-C  Fluticasone-Salmeterol (ADVAIR DISKUS) 250-50 MCG/DOSE AEPB TAKE 1 PUFF BY MOUTH TWICE A DAY *RINSE MOUTH AFTER USE* 08/21/17   Juline Patch, MD  folic acid (FOLVITE) 1 MG tablet Take 1 tablet (1 mg total) by mouth daily. 02/13/17   Cammie Sickle, MD  hydrochlorothiazide (HYDRODIURIL) 25 MG tablet TAKE 1 TABLET BY MOUTH EVERY DAY 05/09/18   Minna Merritts, MD  HYDROcodone-acetaminophen (NORCO/VICODIN) 5-325 MG tablet Take 2 tablets by mouth every 6 (six) hours as needed for moderate pain or severe pain. 07/09/18   Hinda Kehr, MD  hydroxychloroquine (PLAQUENIL) 200 MG tablet Take 400 mg by mouth 2 (two) times daily. Dr Delmer Islam 12/27/15   [provider]  KLOR-CON M20 20 MEQ tablet TAKE 1 TABLET BY MOUTH EVERY DAY 05/23/18   Minna Merritts, MD  meclizine (ANTIVERT) 25 MG tablet Take 1 tablet (25 mg total) by mouth 3 (three) times daily as needed for dizziness or nausea. 04/27/17   Lisa Roca, MD  meloxicam (MOBIC) 15 MG tablet Take 1 tablet (15  mg total) by mouth daily. 06/18/15   Landis Martins, DPM  metoprolol tartrate (LOPRESSOR) 50 MG tablet Take 1.5 tablets (75 mg total) by mouth 2 (two) times daily. 05/14/18   Minna Merritts, MD  Multiple Vitamin (MULTI-VITAMINS) TABS Take by mouth.    [provider]  mycophenolate (CELLCEPT) 500 MG tablet Take 1,000 mg by mouth 2 (two) times daily.  02/14/16   [provider]  pantoprazole (PROTONIX) 40 MG tablet TAKE 1 TABLET (40 MG TOTAL) BY MOUTH DAILY. Patient taking differently: Take 40 mg by mouth 2 (two) times daily.  08/07/17   [name], MD  predniSONE (DELTASONE) 10 MG tablet Take 6 tabs (60 mg) PO x 3 days, then take 4 tabs (40 mg) PO x 3 days, then take 2 tabs (20 mg) PO x 3 days, then take 1 tab (10 mg) PO x 3 days, then take 1/2 tab (5 mg) PO x 4 days. 07/09/18   Hinda Kehr, MD  warfarin (COUMADIN) 5 MG tablet TAKE  AS DIRECTED BY COUMADIN CLINIC 06/03/18   Minna Merritts, MD    Allergies Patient has no known allergies.  Family History  Problem Relation Age of Onset  . Leukemia Mother     Social History Social History   Tobacco Use  . Smoking status: Former Smoker    Packs/day: 1.00    Years: 20.00    Pack years: 20.00    Types: Cigarettes    Last attempt to quit: 08/30/2004    Years since quitting: 13.8  . Smokeless tobacco: Never Used  Substance Use Topics  . Alcohol use: No  . Drug use: No    Review of Systems Constitutional: No fever/chills Cardiovascular: Denies chest pain. Respiratory: Denies shortness of breath. Gastrointestinal: No abdominal pain.  No nausea, no vomiting.   Musculoskeletal: Pain and swelling in the right wrist and hand as described above. Integumentary: Negative for rash. Neurological: Negative for headaches, focal weakness or numbness.   ____________________________________________   PHYSICAL EXAM:  VITAL SIGNS: ED Triage Vitals  Enc Vitals Group     BP 07/09/18 0102 (!) 148/90     Pulse Rate 07/09/18 0102 83     Resp 07/09/18 0102 20     Temp 07/09/18 0102 98.2 F (36.8 C)     Temp Source 07/09/18 0102 Oral     SpO2 07/09/18 0102 98 %     Weight 07/09/18 0103 104.3 kg (230 lb)     Height 07/09/18 0103 1.778 m (5\' 10" )     Head Circumference --      Peak Flow --      Pain Score 07/09/18 0102 10     Pain Loc --      Pain Edu? --      Excl. in [place]? --     Constitutional: Alert and oriented. Well appearing and in no acute distress although he does appear uncomfortable. Eyes: Conjunctivae are normal.  Head: Atraumatic. Cardiovascular: Normal rate, regular rhythm. Good peripheral circulation. Respiratory: Normal respiratory effort.  No retractions.  Musculoskeletal: The right wrist is tender to palpation and feels warm to the touch.  There is likely some darkening of the skin although because of his skin tone it is somewhat difficult to  appreciate erythema.  However there was no fluctuance or induration, just nonspecific edema.  His right hand and all of the digits are edematous compared to the left but the compartments are easily compressible.  There is no evidence of flexor  tenosynovitis in any of the digits.  The overall impression is 1 of inflammation and most specifically gout rather than acute infection. Neurologic:  Normal speech and language. No gross focal neurologic deficits are appreciated.  Skin:  Skin is warm, dry and intact. No rash noted. Psychiatric: Mood and affect are normal. Speech and behavior are normal.  ____________________________________________   LABS (all labs ordered are listed, but only abnormal results are displayed)  Labs Reviewed  CBC WITH DIFFERENTIAL/PLATELET - Abnormal; Notable for the following components:      Result Value   WBC 14.1 (*)    Neutro Abs 11.9 (*)    Monocytes Absolute 1.1 (*)    Abs Immature Granulocytes 0.23 (*)    All other components within normal limits  PROTIME-INR - Abnormal; Notable for the following components:   Prothrombin Time 25.4 (*)    All other components within normal limits  BASIC METABOLIC PANEL - Abnormal; Notable for the following components:   Glucose, Bld 118 (*)    All other components within normal limits  URIC ACID - Abnormal; Notable for the following components:   Uric Acid, Serum 9.5 (*)    All other components within normal limits   ____________________________________________  EKG  None - EKG not ordered by ED physician ____________________________________________  RADIOLOGY Ursula Alert, personally viewed and evaluated these images (plain radiographs) as part of my medical decision making, as well as reviewing the written report by the radiologist.  ED MD interpretation: No acute abnormalities identified on radiographs   Official radiology report(s): Dg Wrist Complete Right  Result Date: 07/09/2018 CLINICAL DATA:   58 year old male with history of pain in the right hand and wrist for the past 2 days. EXAM: RIGHT WRIST - COMPLETE 3+ VIEW COMPARISON:  No priors. FINDINGS: There is no evidence of fracture or dislocation. Mild multifocal joint space narrowing, subchondral sclerosis, subchondral cyst formation and osteophyte formation, indicative of osteoarthritis. There is no evidence of inflammatory arthropathy or other focal bone abnormality. Soft tissues are unremarkable. IMPRESSION: 1. No acute radiographic abnormality of the right wrist. Mild degenerative changes of osteoarthritis. Electronically Signed   By: Vinnie Langton M.D.   On: 07/09/2018 05:17   Dg Hand Complete Right  Result Date: 07/09/2018 CLINICAL DATA:  58 year old male with history of pain in the right wrist and hand for the past 2 days. EXAM: RIGHT HAND - COMPLETE 3+ VIEW COMPARISON:  No priors. FINDINGS: There is no evidence of fracture or dislocation. Mild multifocal joint space narrowing, subchondral sclerosis and osteophyte formation compatible with mild osteoarthritis. There is no evidence of inflammatory arthropathy or other focal bone abnormality. Soft tissues are unremarkable. IMPRESSION: 1. No acute radiographic abnormality of the right hand. Electronically Signed   By: Vinnie Langton M.D.   On: 07/09/2018 05:15    ____________________________________________   PROCEDURES  Critical Care performed: No   Procedure(s) performed:   Procedures   ____________________________________________   INITIAL IMPRESSION / ASSESSMENT AND PLAN / ED COURSE  As part of my medical decision making, I reviewed the following data within the [facility] notes reviewed and incorporated, Labs reviewed , Old chart reviewed, Radiograph reviewed , Notes from prior ED visits and [place] Controlled Substance Database    Differential diagnosis includes, but is not limited to, gout, septic arthritis, nonspecific inflammatory  arthritis, cellulitis, flexor tenosynovitis.  The patient is overall well-appearing and in no distress except for the pain in his wrist and hand.  He has  had no systemic signs of infection.  He has had no penetrating injuries or traumatic injuries to the hand or wrist.  I believe that this is most likely gout.  I called and spoke with Dr. Sabra Heck by phone to discuss the case and without me suggesting a possible diagnosis but just describing it clinically, he also believes that this is gout.  He recommended colchicine 0.6 mg twice daily as well as a prednisone taper.  I am also prescribing a small number of narcotics to help with the acute pain.  I encouraged the patient to follow-up with Dr. Jackqulyn Livings on Thursday for a clinic appointment to reassess.  As per Dr. Ammie Ferrier recommendation I also provided a wrist splint for comfort.  The patient's labs were reassuring with an appropriate INR, very mild leukocytosis, and elevated uric acid level of 9.5, and a normal basic metabolic panel.  I gave my usual customary return precautions and he understands and agrees with the plan.     ____________________________________________  FINAL CLINICAL IMPRESSION(S) / ED DIAGNOSES  Final diagnoses:  Acute gout of right wrist, unspecified cause     MEDICATIONS GIVEN DURING THIS VISIT:  Medications  oxyCODONE-acetaminophen (PERCOCET/ROXICET) 5-325 MG per tablet 2 tablet (2 tablets Oral Given 07/09/18 0552)     ED Discharge Orders         Ordered    predniSONE (DELTASONE) 10 MG tablet     07/09/18 0709    colchicine 0.6 MG tablet  2 times daily     07/09/18 0709    HYDROcodone-acetaminophen (NORCO/VICODIN) 5-325 MG tablet  Every 6 hours PRN     07/09/18 0709    docusate sodium (COLACE) 100 MG capsule     07/09/18 7106           Note:  This document was prepared using Dragon voice recognition software and may include unintentional dictation errors.   Hinda Kehr, MD 07/09/18 [phone]

## 2018-07-09 NOTE — ED Triage Notes (Signed)
Pt in with co right wrist and right hand pain x 2 days. No hx of the same but does have arthritis. Pt denies any injury.

## 2018-07-17 ENCOUNTER — Ambulatory Visit: Payer: BLUE CROSS/BLUE SHIELD

## 2018-07-17 DIAGNOSIS — Z5181 Encounter for therapeutic drug level monitoring: Secondary | ICD-10-CM | POA: Diagnosis not present

## 2018-07-17 DIAGNOSIS — I483 Typical atrial flutter: Secondary | ICD-10-CM | POA: Diagnosis not present

## 2018-07-17 LAB — POCT INR: INR: 4.2 — AB (ref 2.0–3.0)

## 2018-07-17 NOTE — Patient Instructions (Signed)
Please skip coumadin today & tomorrow, then resume dosage of 1 tablet daily except 1/2 tablet on Mondays  & Fridays.   Please be consistent with your green leafy vegetables and green tea.  Recheck in 4 weeks. Please call the office if you are put on any antibiotics or additional prednisone.

## 2018-08-08 ENCOUNTER — Other Ambulatory Visit: Payer: Self-pay | Admitting: Cardiovascular Disease

## 2018-08-14 ENCOUNTER — Ambulatory Visit (INDEPENDENT_AMBULATORY_CARE_PROVIDER_SITE_OTHER): Payer: BLUE CROSS/BLUE SHIELD

## 2018-08-14 ENCOUNTER — Other Ambulatory Visit: Payer: Self-pay

## 2018-08-14 DIAGNOSIS — I483 Typical atrial flutter: Secondary | ICD-10-CM

## 2018-08-14 DIAGNOSIS — Z5181 Encounter for therapeutic drug level monitoring: Secondary | ICD-10-CM | POA: Diagnosis not present

## 2018-08-14 LAB — POCT INR: INR: 2.5 (ref 2.0–3.0)

## 2018-08-14 NOTE — Patient Instructions (Signed)
Please continue dosage of 1 tablet daily except 1/2 tablet on Mondays  & Fridays.   Please be consistent with your green leafy vegetables and green tea.  Recheck in 6 weeks. Please call the office if you are put on any antibiotics or additional prednisone.

## 2018-08-15 ENCOUNTER — Other Ambulatory Visit: Payer: Self-pay | Admitting: Cardiovascular Disease

## 2018-08-20 ENCOUNTER — Other Ambulatory Visit: Payer: Self-pay

## 2018-08-20 DIAGNOSIS — D594 Other nonautoimmune hemolytic anemias: Secondary | ICD-10-CM

## 2018-08-26 ENCOUNTER — Inpatient Hospital Stay: Payer: BLUE CROSS/BLUE SHIELD | Attending: Hematology and Oncology

## 2018-08-26 ENCOUNTER — Other Ambulatory Visit: Payer: Self-pay

## 2018-08-26 DIAGNOSIS — Z79899 Other long term (current) drug therapy: Secondary | ICD-10-CM | POA: Diagnosis not present

## 2018-08-26 DIAGNOSIS — Z87891 Personal history of nicotine dependence: Secondary | ICD-10-CM | POA: Insufficient documentation

## 2018-08-26 DIAGNOSIS — I4891 Unspecified atrial fibrillation: Secondary | ICD-10-CM | POA: Insufficient documentation

## 2018-08-26 DIAGNOSIS — I10 Essential (primary) hypertension: Secondary | ICD-10-CM | POA: Diagnosis not present

## 2018-08-26 DIAGNOSIS — D594 Other nonautoimmune hemolytic anemias: Secondary | ICD-10-CM

## 2018-08-26 DIAGNOSIS — Z7901 Long term (current) use of anticoagulants: Secondary | ICD-10-CM | POA: Diagnosis not present

## 2018-08-26 DIAGNOSIS — D591 Other autoimmune hemolytic anemias: Secondary | ICD-10-CM | POA: Insufficient documentation

## 2018-08-26 LAB — COMPREHENSIVE METABOLIC PANEL
ALT: 19 U/L (ref 0–44)
AST: 20 U/L (ref 15–41)
Albumin: 4.2 g/dL (ref 3.5–5.0)
Alkaline Phosphatase: 45 U/L (ref 38–126)
Anion gap: 9 (ref 5–15)
BUN: 22 mg/dL — ABNORMAL HIGH (ref 6–20)
CO2: 26 mmol/L (ref 22–32)
Calcium: 9.3 mg/dL (ref 8.9–10.3)
Chloride: 106 mmol/L (ref 98–111)
Creatinine, Ser: 1.19 mg/dL (ref 0.61–1.24)
GFR calc Af Amer: >60 mL/min (ref >60)
GFR calc non Af Amer: >60 mL/min (ref >60)
GLUCOSE: 92 mg/dL (ref 70–99)
Potassium: 3.4 mmol/L — ABNORMAL LOW (ref 3.5–5.1)
Sodium: 141 mmol/L (ref 135–145)
Total Bilirubin: 0.3 mg/dL (ref 0.3–1.2)
Total Protein: 7.3 g/dL (ref 6.5–8.1)

## 2018-08-26 LAB — CBC WITH DIFFERENTIAL/PLATELET
Abs Immature Granulocytes: 0.08 10*3/uL — ABNORMAL HIGH (ref 0.00–0.07)
Basophils Absolute: 0 10*3/uL (ref 0.0–0.1)
Basophils Relative: 1 %
EOS ABS: 0.2 10*3/uL (ref 0.0–0.5)
Eosinophils Relative: 3 %
HEMATOCRIT: 43.7 % (ref 39.0–52.0)
Hemoglobin: 13.7 g/dL (ref 13.0–17.0)
Immature Granulocytes: 1 %
Lymphocytes Relative: 26 %
Lymphs Abs: 1.9 10*3/uL (ref 0.7–4.0)
MCH: 27.6 pg (ref 26.0–34.0)
MCHC: 31.4 g/dL (ref 30.0–36.0)
MCV: 87.9 fL (ref 80.0–100.0)
Monocytes Absolute: 0.9 10*3/uL (ref 0.1–1.0)
Monocytes Relative: 12 %
Neutro Abs: 4.1 10*3/uL (ref 1.7–7.7)
Neutrophils Relative %: 57 %
Platelets: 182 10*3/uL (ref 150–400)
RBC: 4.97 MIL/uL (ref 4.22–5.81)
RDW: 14.9 % (ref 11.5–15.5)
WBC: 7.2 10*3/uL (ref 4.0–10.5)
nRBC: 0 % (ref 0.0–0.2)

## 2018-08-26 LAB — LACTATE DEHYDROGENASE: LDH: 150 U/L (ref 98–192)

## 2018-09-04 ENCOUNTER — Other Ambulatory Visit: Payer: Self-pay | Admitting: *Deleted

## 2018-09-04 MED ORDER — WARFARIN SODIUM 5 MG PO TABS: ORAL_TABLET | ORAL | 0 refills | Status: DC

## 2018-09-09 ENCOUNTER — Other Ambulatory Visit: Payer: Self-pay

## 2018-09-09 DIAGNOSIS — I10 Essential (primary) hypertension: Secondary | ICD-10-CM

## 2018-09-09 MED ORDER — HYDROCHLOROTHIAZIDE 25 MG PO TABS: 25.0000 mg | ORAL_TABLET | Freq: Every day | ORAL | 0 refills | Status: DC

## 2018-09-09 MED ORDER — ENALAPRIL MALEATE 20 MG PO TABS: 20.0000 mg | ORAL_TABLET | Freq: Every day | ORAL | 3 refills | Status: DC

## 2018-09-09 MED ORDER — METOPROLOL TARTRATE 50 MG PO TABS: 75.0000 mg | ORAL_TABLET | Freq: Two times a day (BID) | ORAL | 2 refills | Status: DC

## 2018-09-09 MED ORDER — DILTIAZEM HCL ER COATED BEADS 120 MG PO CP24: ORAL_CAPSULE | ORAL | 1 refills | Status: DC

## 2018-09-12 ENCOUNTER — Other Ambulatory Visit: Payer: Self-pay | Admitting: *Deleted

## 2018-09-12 ENCOUNTER — Other Ambulatory Visit: Payer: Self-pay | Admitting: Cardiovascular Disease

## 2018-09-12 MED ORDER — METOPROLOL TARTRATE 50 MG PO TABS: 75.0000 mg | ORAL_TABLET | Freq: Two times a day (BID) | ORAL | 2 refills | Status: DC

## 2018-09-12 MED ORDER — POTASSIUM CHLORIDE CRYS ER 20 MEQ PO TBCR: 20.0000 meq | EXTENDED_RELEASE_TABLET | Freq: Every day | ORAL | 2 refills | Status: DC

## 2018-09-12 MED ORDER — ENALAPRIL MALEATE 20 MG PO TABS: 20.0000 mg | ORAL_TABLET | Freq: Every day | ORAL | 3 refills | Status: DC

## 2018-09-23 ENCOUNTER — Telehealth: Payer: Self-pay

## 2018-09-23 NOTE — Telephone Encounter (Signed)
1. Do you currently have a fever? No(yes = cancel and refer to pcp for e-visit) 2. Have you recently travelled on a cruise, internationally, or to [place], [place], [place], [place], [place], or [place], [place] ([employer]) ? No (yes = cancel, stay home, monitor symptoms, and contact pcp or initiate e-visit if symptoms develop) 3. Have you been in contact with someone that is currently pending confirmation of Covid19 testing or has been confirmed to have the Covid19 virus?  No (yes = cancel, stay home, away from tested individual, monitor symptoms, and contact pcp or initiate e-visit if symptoms develop) 4. Are you currently experiencing fatigue or cough? No (yes = pt should be prepared to have a mask placed at the time of their visit).  Spoke w/ pt. Advised that we are restricting visitors at this time and anyone present in the vehicle should meet the above criteria as well. Advised that visit will be at curbside for finger stick ONLY and will receive call with instructions. Pt also advised to please bring own pen for signature of arrival document.   

## 2018-09-25 ENCOUNTER — Ambulatory Visit (INDEPENDENT_AMBULATORY_CARE_PROVIDER_SITE_OTHER): Payer: BLUE CROSS/BLUE SHIELD

## 2018-09-25 ENCOUNTER — Other Ambulatory Visit: Payer: Self-pay

## 2018-09-25 DIAGNOSIS — Z5181 Encounter for therapeutic drug level monitoring: Secondary | ICD-10-CM

## 2018-09-25 DIAGNOSIS — I483 Typical atrial flutter: Secondary | ICD-10-CM | POA: Diagnosis not present

## 2018-09-25 LAB — POCT INR: INR: 2.2 (ref 2.0–3.0)

## 2018-09-25 NOTE — Patient Instructions (Signed)
Please continue dosage of 1 tablet daily except 1/2 tablet on Mondays  & Fridays.   Please be consistent with your green leafy vegetables and green tea.  Recheck in 8 weeks.

## 2018-10-02 ENCOUNTER — Other Ambulatory Visit: Payer: Self-pay | Admitting: *Deleted

## 2018-10-02 MED ORDER — POTASSIUM CHLORIDE CRYS ER 20 MEQ PO TBCR: 20.0000 meq | EXTENDED_RELEASE_TABLET | Freq: Every day | ORAL | 2 refills | Status: DC

## 2018-10-04 ENCOUNTER — Other Ambulatory Visit: Payer: Self-pay | Admitting: *Deleted

## 2018-10-04 MED ORDER — POTASSIUM CHLORIDE CRYS ER 20 MEQ PO TBCR: 20.0000 meq | EXTENDED_RELEASE_TABLET | Freq: Every day | ORAL | 0 refills | Status: DC

## 2018-10-10 ENCOUNTER — Other Ambulatory Visit: Payer: Self-pay

## 2018-10-10 MED ORDER — POTASSIUM CHLORIDE CRYS ER 20 MEQ PO TBCR: 20.0000 meq | EXTENDED_RELEASE_TABLET | Freq: Every day | ORAL | 3 refills | Status: DC

## 2018-11-18 ENCOUNTER — Telehealth: Payer: Self-pay

## 2018-11-18 NOTE — Telephone Encounter (Signed)

## 2018-11-20 ENCOUNTER — Other Ambulatory Visit: Payer: Self-pay

## 2018-11-20 ENCOUNTER — Ambulatory Visit (INDEPENDENT_AMBULATORY_CARE_PROVIDER_SITE_OTHER): Payer: Medicare HMO

## 2018-11-20 DIAGNOSIS — I483 Typical atrial flutter: Secondary | ICD-10-CM | POA: Diagnosis not present

## 2018-11-20 DIAGNOSIS — I639 Cerebral infarction, unspecified: Secondary | ICD-10-CM

## 2018-11-20 DIAGNOSIS — I4892 Unspecified atrial flutter: Secondary | ICD-10-CM | POA: Diagnosis not present

## 2018-11-20 DIAGNOSIS — Z5181 Encounter for therapeutic drug level monitoring: Secondary | ICD-10-CM | POA: Diagnosis not present

## 2018-11-20 LAB — POCT INR: INR: 2.1 (ref 2.0–3.0)

## 2018-11-20 NOTE — Patient Instructions (Signed)
Please continue dosage of 1 tablet daily except 1/2 tablet on Mondays  & Fridays.   Please be consistent with your green leafy vegetables and green tea.  Recheck in 6 weeks. 

## 2018-11-25 ENCOUNTER — Other Ambulatory Visit: Payer: Self-pay | Admitting: *Deleted

## 2018-11-25 DIAGNOSIS — D5919 Other autoimmune hemolytic anemia: Secondary | ICD-10-CM

## 2018-11-26 ENCOUNTER — Other Ambulatory Visit: Payer: Self-pay

## 2018-11-26 ENCOUNTER — Inpatient Hospital Stay: Payer: Medicare HMO

## 2018-11-26 ENCOUNTER — Other Ambulatory Visit: Payer: Self-pay | Admitting: *Deleted

## 2018-11-26 ENCOUNTER — Inpatient Hospital Stay: Payer: Medicare HMO | Attending: Internal Medicine | Admitting: Internal Medicine

## 2018-11-26 DIAGNOSIS — Z79899 Other long term (current) drug therapy: Secondary | ICD-10-CM

## 2018-11-26 DIAGNOSIS — I10 Essential (primary) hypertension: Secondary | ICD-10-CM | POA: Insufficient documentation

## 2018-11-26 DIAGNOSIS — Z87891 Personal history of nicotine dependence: Secondary | ICD-10-CM | POA: Diagnosis not present

## 2018-11-26 DIAGNOSIS — D591 Other autoimmune hemolytic anemias: Secondary | ICD-10-CM | POA: Diagnosis not present

## 2018-11-26 DIAGNOSIS — Z7901 Long term (current) use of anticoagulants: Secondary | ICD-10-CM

## 2018-11-26 DIAGNOSIS — I4891 Unspecified atrial fibrillation: Secondary | ICD-10-CM | POA: Diagnosis not present

## 2018-11-26 DIAGNOSIS — D594 Other nonautoimmune hemolytic anemias: Secondary | ICD-10-CM

## 2018-11-26 DIAGNOSIS — D5919 Other autoimmune hemolytic anemia: Secondary | ICD-10-CM

## 2018-11-26 LAB — CBC WITH DIFFERENTIAL/PLATELET
Abs Immature Granulocytes: 0.12 10*3/uL — ABNORMAL HIGH (ref 0.00–0.07)
Basophils Absolute: 0.1 10*3/uL (ref 0.0–0.1)
Basophils Relative: 1 %
Eosinophils Absolute: 0.3 10*3/uL (ref 0.0–0.5)
Eosinophils Relative: 3 %
HCT: 42.2 % (ref 39.0–52.0)
Hemoglobin: 12.9 g/dL — ABNORMAL LOW (ref 13.0–17.0)
Immature Granulocytes: 1 %
Lymphocytes Relative: 17 %
Lymphs Abs: 1.7 10*3/uL (ref 0.7–4.0)
MCH: 26.9 pg (ref 26.0–34.0)
MCHC: 30.6 g/dL (ref 30.0–36.0)
MCV: 87.9 fL (ref 80.0–100.0)
Monocytes Absolute: 0.9 10*3/uL (ref 0.1–1.0)
Monocytes Relative: 9 %
Neutro Abs: 7 10*3/uL (ref 1.7–7.7)
Neutrophils Relative %: 69 %
Platelets: 199 10*3/uL (ref 150–400)
RBC: 4.8 MIL/uL (ref 4.22–5.81)
RDW: 13.6 % (ref 11.5–15.5)
WBC: 10.1 10*3/uL (ref 4.0–10.5)
nRBC: 0 % (ref 0.0–0.2)

## 2018-11-26 LAB — LACTATE DEHYDROGENASE: LDH: 141 U/L (ref 98–192)

## 2018-11-26 NOTE — Progress Notes (Signed)
[place] OFFICE PROGRESS NOTE  Patient Care Team: Nelwyn Salisbury, Hershal Coria as PCP - General (Physician Assistant) Minna Merritts, MD as Consulting Physician (Cardiology)   Oncology History   No history exists.   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  male pleasant patient above history of Autoimmune hemolytic anemia- currently on prednisone 10 mg/CellCept 500 twice a day [also for Autoimmune/connective tissue disorder]- is here for follow-up.   Patient denies any worsening fatigue.  Denies any blood in stools or black or stools.  Continues to be on Coumadin for his A. Fib.  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   Review of Systems  Constitutional: Negative for chills, diaphoresis, fever, malaise/fatigue and weight loss.  HENT: Negative for nosebleeds and sore throat.   Eyes: Negative for double vision.  Respiratory: Negative for cough, hemoptysis, sputum production, shortness of breath and wheezing.   Cardiovascular: Negative for chest pain, palpitations, orthopnea and leg swelling.  Gastrointestinal: Negative for abdominal pain, blood in stool, constipation, diarrhea, heartburn, melena, nausea and vomiting.  Genitourinary: Negative for dysuria, frequency and urgency.  Musculoskeletal: Negative for back pain and joint pain.  Skin: Negative.  Negative for itching and rash.  Neurological: Negative for dizziness, tingling, focal weakness, weakness and headaches.  Endo/Heme/Allergies:  Does not bruise/bleed easily.  Psychiatric/Behavioral: Negative for depression. The patient is not nervous/anxious and does not have insomnia.      PAST MEDICAL HISTORY :  Past Medical History:  Diagnosis Date  . Anemia 10/2015  . Atrial flutter ([place])    a. on Xarelto; b. s/p successful TEE/DCCV on 08/28/2014  . Gangrene of finger ([place])   . GERD (gastroesophageal reflux disease)   . History of nuclear stress test    a. 09/02/2014: no sig ischemia, GI uptake noted, no sig WMA, EF 48%, no EKG chanes concerning for ischemia, low risk scan    . HTN (hypertension)   . Normal coronary arteries    a. cardiac cath 12/03/2014: no sigificant CAD, right dominant system, LVEF >55%, no MR or AS  . Pulmonary fibrosis ([place])    a. not on home oxygen  . RA (rheumatoid arthritis) ([place])   . 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 echo 07/2014: EF 20-25%, cannot exclude atrial flutter,  global HK, possible bicuspid Ao valve, mod reduced RV sys fxn, mild-mod aortic valve sclerosis/calcification, mod TR, mildly elevated RVSP; b. TEE 08/28/2014: EF 30-35%, no intracardic thrombus, mildly dilated LA/RA, mild TR, mild-mod aortic sclerosis w/o stenosis; c. echo 09/2014: EF 55-60%, select images w/ inf HK    PAST SURGICAL HISTORY :   Past Surgical History:  Procedure Laterality Date  . CARDIAC CATHETERIZATION N/A 12/03/2014   Procedure: Left Heart Cath and Coronary Angiography;  Surgeon: Minna Merritts, MD;  Location: [place] [facility];  Service: Cardiovascular;  Laterality: N/A;  . COLONOSCOPY WITH PROPOFOL N/A 11/23/2015   Procedure: COLONOSCOPY WITH PROPOFOL;  Surgeon: [name], MD;  Location: [facility];  Service: Endoscopy;  Laterality: N/A;  . COLONOSCOPY WITH PROPOFOL N/A 05/04/2017   Procedure: COLONOSCOPY WITH PROPOFOL;  Surgeon: [name], MD;  Location: [place];  Service: Endoscopy;  Laterality: N/A;  . ESOPHAGOGASTRODUODENOSCOPY (EGD) WITH PROPOFOL N/A 11/23/2015   Procedure:  ESOPHAGOGASTRODUODENOSCOPY (EGD) WITH PROPOFOL;  Surgeon: [name], MD;  Location: [facility];  Service: Endoscopy;  Laterality: N/A;  . HAND SURGERY Left   . PERIPHERAL VASCULAR CATHETERIZATION N/A 11/17/2015   Procedure: Upper Extremity Angiography;  Surgeon: Wellington Hampshire, MD;  Location: [place] [facility];  Service: Cardiovascular;  Laterality: N/A;  . POLYPECTOMY  05/04/2017   Procedure: POLYPECTOMY;  Surgeon: [name], MD;  Location: [place];  Service: Endoscopy;;    FAMILY HISTORY :   Family History  Problem Relation Age of Onset  . Leukemia Mother     SOCIAL HISTORY:   Social History   Tobacco Use  . Smoking status: Former Smoker    Packs/day: 1.00    Years: 20.00    Pack years: 20.00    Types: Cigarettes    Quit date: 08/30/2004    Years since quitting: 14.2  . Smokeless tobacco: Never Used  Substance Use Topics  . Alcohol use: No  . Drug use: No    ALLERGIES:  has No Known Allergies.  MEDICATIONS:  Current Outpatient Medications  Medication Sig Dispense Refill  . diltiazem (CARDIZEM CD) 120 MG 24 hr capsule TAKE (1) CAPSULE BY MOUTH EVERY DAY 90 capsule 2  . docusate sodium (COLACE) 100 MG capsule Take 1 tablet once or twice daily as needed for constipation while taking narcotic pain medicine 30 capsule 0  . enalapril (VASOTEC) 20 MG tablet Take 1 tablet (20 mg total) by mouth daily. 90 tablet 3  . fluticasone (FLONASE) 50 MCG/ACT nasal spray Place 2 sprays into both nostrils daily. 16 g 0  . Fluticasone-Salmeterol (ADVAIR DISKUS) 250-50 MCG/DOSE AEPB TAKE 1 PUFF BY MOUTH TWICE A DAY *RINSE MOUTH AFTER USE* 14 each 0  . folic acid (FOLVITE) 1 MG tablet Take 1 tablet (1 mg total) by mouth daily. 90 tablet 1  . hydrochlorothiazide (HYDRODIURIL) 25 MG tablet Take 1 tablet (25 mg total) by mouth daily. 90 tablet 0  . HYDROcodone-acetaminophen (NORCO/VICODIN) 5-325 MG tablet Take 2 tablets by mouth every 6 (six) hours as needed for moderate pain  or severe pain. 16 tablet 0  . hydroxychloroquine (PLAQUENIL) 200 MG tablet Take 400 mg by mouth 2 (two) times daily. Dr Delmer Islam    . meloxicam (MOBIC) 15 MG tablet Take 1 tablet (15 mg total) by mouth daily. 30 tablet 0  . metoprolol tartrate (LOPRESSOR) 50 MG tablet Take 1.5 tablets (75 mg total) by mouth 2 (two) times daily. 270 tablet 2  . Multiple Vitamin (MULTI-VITAMINS) TABS Take by mouth.    . mycophenolate (CELLCEPT) 500 MG tablet Take 1,000 mg by mouth 2 (two) times daily.   11  . pantoprazole (PROTONIX) 40 MG tablet TAKE 1 TABLET (40 MG TOTAL) BY MOUTH DAILY. (Patient taking differently: Take 40 mg by mouth 2 (two) times daily. ) 30 tablet 6  . predniSONE (DELTASONE) 5 MG tablet     . warfarin (COUMADIN) 5 MG tablet TAKE AS DIRECTED BY COUMADIN CLINIC 90 tablet 0  . albuterol (PROVENTIL HFA;VENTOLIN HFA) 108 (90 Base) MCG/ACT inhaler Inhale 2 puffs into the lungs 4 (four) times daily as needed for wheezing or shortness of breath. 18 Inhaler 5  . colchicine 0.6 MG tablet Take 1 tablet (0.6 mg total) by mouth 2 (two) times daily. Stop taking the medication two days after your symptoms resolve, or as  directed by a physician. (Patient not taking: Reported on 11/26/2018) 60 tablet 2  . meclizine (ANTIVERT) 25 MG tablet Take 1 tablet (25 mg total) by mouth 3 (three) times daily as needed for dizziness or nausea. (Patient not taking: Reported on 11/26/2018) 20 tablet 0  . potassium chloride SA (KLOR-CON M20) 20 MEQ tablet Take 1 tablet (20 mEq total) by mouth daily. 90 tablet 3  . predniSONE (DELTASONE) 10 MG tablet Take 6 tabs (60 mg) PO x 3 days, then take 4 tabs (40 mg) PO x 3 days, then take 2 tabs (20 mg) PO x 3 days, then take 1 tab (10 mg) PO x 3 days, then take 1/2 tab (5 mg) PO x 4 days. (Patient not taking: Reported on 11/26/2018) 41 tablet 0   No current facility-administered medications for this visit.     PHYSICAL EXAMINATION:   BP 131/89   Pulse 67   Temp 98 F (36.7 C)   Resp  18   Wt 235 lb 14.4 oz (107 kg)   BMI 33.85 kg/m   Filed Weights   11/26/18 0953  Weight: 235 lb 14.4 oz (107 kg)   Physical Exam  Constitutional: He is oriented to person, place, and time and well-developed, well-nourished, and in no distress.  HENT:  Head: Normocephalic and atraumatic.  Mouth/Throat: Oropharynx is clear and moist. No oropharyngeal exudate.  Eyes: Pupils are equal, round, and reactive to light.  Neck: Normal range of motion. Neck supple.  Cardiovascular: Normal rate and regular rhythm.  Pulmonary/Chest: No respiratory distress. He has no wheezes.  Abdominal: Soft. Bowel sounds are normal. He exhibits no distension and no mass. There is no abdominal tenderness. There is no rebound and no guarding.  Musculoskeletal: Normal range of motion.        General: No tenderness or edema.  Neurological: He is alert and oriented to person, place, and time.  Skin: Skin is warm.  Psychiatric: Affect normal.    LABORATORY DATA:  I have reviewed the data as listed    Component Value Date/Time   NA 141 08/26/2018 0931   NA 138 12/11/2014 1518   NA 137 08/27/2014 0403   K 3.4 (L) 08/26/2018 0931   K 4.1 08/27/2014 0403   CL 106 08/26/2018 0931   CL 108 08/27/2014 0403   CO2 26 08/26/2018 0931   CO2 26 08/27/2014 0403   GLUCOSE 92 08/26/2018 0931   GLUCOSE 94 08/27/2014 0403   BUN 22 (H) 08/26/2018 0931   BUN 24 12/11/2014 1518   BUN 16 08/27/2014 0403   CREATININE 1.19 08/26/2018 0931   CREATININE 0.85 08/27/2014 0403   CALCIUM 9.3 08/26/2018 0931   CALCIUM 8.6 (L) 08/27/2014 0403   PROT 7.3 08/26/2018 0931   ALBUMIN 4.2 08/26/2018 0931   AST 20 08/26/2018 0931   ALT 19 08/26/2018 0931   ALKPHOS 45 08/26/2018 0931   BILITOT 0.3 08/26/2018 0931   GFRNONAA >60 08/26/2018 0931   GFRNONAA >60 08/27/2014 0403   GFRAA >60 08/26/2018 0931   GFRAA >60 08/27/2014 0403    No results found for: SPEP, UPEP  Lab Results  Component Value Date   WBC 10.1 11/26/2018    NEUTROABS 7.0 11/26/2018   HGB 12.9 (L) 11/26/2018   HCT 42.2 11/26/2018   MCV 87.9 11/26/2018   PLT 199 11/26/2018      Chemistry      Component Value Date/Time   NA 141 08/26/2018 0931   NA 138 12/11/2014 1518  NA 137 08/27/2014 0403   K 3.4 (L) 08/26/2018 0931   K 4.1 08/27/2014 0403   CL 106 08/26/2018 0931   CL 108 08/27/2014 0403   CO2 26 08/26/2018 0931   CO2 26 08/27/2014 0403   BUN 22 (H) 08/26/2018 0931   BUN 24 12/11/2014 1518   BUN 16 08/27/2014 0403   CREATININE 1.19 08/26/2018 0931   CREATININE 0.85 08/27/2014 0403      Component Value Date/Time   CALCIUM 9.3 08/26/2018 0931   CALCIUM 8.6 (L) 08/27/2014 0403   ALKPHOS 45 08/26/2018 0931   AST 20 08/26/2018 0931   ALT 19 08/26/2018 0931   BILITOT 0.3 08/26/2018 0931       RADIOGRAPHIC STUDIES: I have personally reviewed the radiological images as listed and agreed with the findings in the report. No results found.   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  # Hemoglobin today 12.9.  LDH normal.  No evidence of hemolysis. Stable.   #History of A. fib on Coumadin- stable.   # DISPOSITION: #  H&H/LDH in 3 months [[place]];  # follow up-MD/[place]-CBC/LDH in 6 months-Dr.B    No orders of the defined types were placed in this encounter.  All questions were answered. The patient knows to call the clinic with any problems, questions or concerns.      Cammie Sickle, MD 11/26/2018 10:17 AM

## 2018-11-26 NOTE — Assessment & Plan Note (Addendum)
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  # Hemoglobin today 12.9.  LDH normal.  No evidence of hemolysis. Stable.   #History of A. fib on Coumadin- stable.   # DISPOSITION: #  H&H/LDH in 3 months [[place]];  # follow up-MD/[place]-CBC/LDH in 6 months-Dr.B

## 2018-11-26 NOTE — Progress Notes (Signed)
Patient does not offer any problems today.  

## 2018-12-03 ENCOUNTER — Other Ambulatory Visit: Payer: Self-pay | Admitting: Cardiovascular Disease

## 2018-12-03 DIAGNOSIS — I10 Essential (primary) hypertension: Secondary | ICD-10-CM

## 2018-12-18 ENCOUNTER — Telehealth: Payer: Self-pay | Admitting: Cardiovascular Disease

## 2018-12-18 ENCOUNTER — Ambulatory Visit (INDEPENDENT_AMBULATORY_CARE_PROVIDER_SITE_OTHER): Payer: Medicare HMO | Admitting: Nurse Practitioner

## 2018-12-18 ENCOUNTER — Encounter: Payer: Self-pay | Admitting: Nurse Practitioner

## 2018-12-18 ENCOUNTER — Other Ambulatory Visit: Payer: Self-pay

## 2018-12-18 ENCOUNTER — Telehealth: Payer: Self-pay

## 2018-12-18 VITALS — BP 136/78 | HR 82 | Ht 70.0 in | Wt 235.4 lb

## 2018-12-18 DIAGNOSIS — I10 Essential (primary) hypertension: Secondary | ICD-10-CM

## 2018-12-18 DIAGNOSIS — I2510 Atherosclerotic heart disease of native coronary artery without angina pectoris: Secondary | ICD-10-CM

## 2018-12-18 DIAGNOSIS — I4892 Unspecified atrial flutter: Secondary | ICD-10-CM | POA: Diagnosis not present

## 2018-12-18 NOTE — Telephone Encounter (Signed)
° °  [facility] Pre-operative Risk Assessment    Request for surgical clearance:  1. What type of surgery is being performed? Dental implant placement  2. When is this surgery scheduled? TBD  3. What type of clearance is required (medical clearance vs. Pharmacy clearance to hold med vs. Both)? both  4. Are there any medications that need to be held prior to surgery and how long? Any blood thinners will need to be advised  5. Practice name and name of physician performing surgery?  [facility]  6. What is your office phone number [phone]   7.   What is your office fax number [phone]  8.   Anesthesia type (None, local, MAC, general) ? Not noted   [name] 12/18/2018, 8:49 AM  _________________________________________________________________   (provider comments below)

## 2018-12-18 NOTE — Telephone Encounter (Signed)
Pt is scheduled to see Ignacia Bayley, NP today for clearance

## 2018-12-18 NOTE — Telephone Encounter (Signed)
Patient with diagnosis of atrial fibrillation on warfarin for anticoagulation.    Procedure: dental implant Date of procedure: TBD  CHADS2-VASc score of  2 (CHF, HTN,)  CrCl 101.2 Platelet count 199  We normally do not recommend holding anticoagulation for single tooth extractions.  If you feel the risk of bleeding is higher with single implant placement, would recommend holding warfarin for 2 days prior to appointment.  If multiple implant placements will occur, it is okay to hold warfarin for 5 days without need for lovenox bridging.    Note:  Chart had indicated patient history of stroke.  Patient saw Ignacia Bayley PA and discussed:  In 06/2016 he saw his pcp for left sided facial numbness. He then saw neuro @ [facility] and though they thought that it was trigeminal neuralgia, they added cva to his problem list as an indication for the mri that they wanted. I can't find the mri in care everywhere or epic, but he hasn't had f/u w/ neuro since and believes that that work up was neg... so no h/o stroke. I'll remove it from his problem list.

## 2018-12-18 NOTE — Progress Notes (Signed)
Office Visit    Patient Name: [name] Date of Encounter: 12/18/2018  Primary Care Provider:  Nelwyn Salisbury, PA-C Primary Cardiologist:  Ida Rogue, MD  Chief Complaint    58 year old male with a history of paroxysmal atrial flutter on chronic warfarin anticoagulation, hypertension, pulmonary fibrosis/interstitial lung disease, rheumatoid arthritis, prior tachycardia induced cardiomyopathy with subsequent recovery of LV function, nonobstructive CAD, GERD, hemolytic anemia, and gout, who presents for follow-up of paroxysmal atrial flutter and preoperative cardiovascular exam pending dental implants.  Past Medical History    Past Medical History:  Diagnosis Date   Anemia 10/2015   Gangrene of finger (HCC)    GERD (gastroesophageal reflux disease)    Gout    Hemolytic anemia (HCC)    History of nuclear stress test    a. 09/02/2014: no sig ischemia, GI uptake noted, no sig WMA, EF 48%, no EKG chanes concerning for ischemia, low risk scan     HTN (hypertension)    Normal coronary arteries    a. cardiac cath 12/03/2014: no sigificant CAD, right dominant system, LVEF >55%, no MR or AS   Paroxysmal atrial flutter (HCC)    a. s/p successful TEE/DCCV on 08/28/2014-->was on xarelto, now on warfarin.   Pulmonary fibrosis (HCC)    a. not on home oxygen   RA (rheumatoid arthritis) (HCC)    Tachycardia-induced cardiomyopathy ([place])    a. echo 07/2014: EF 20-25%, cannot exclude atrial flutter,  global HK, possible bicuspid Ao valve, mod reduced RV sys fxn, mild-mod aortic valve sclerosis/calcification, mod TR, mildly elevated RVSP; b. TEE 08/28/2014: EF 30-35%, no intracardic thrombus, mildly dilated LA/RA, mild TR, mild-mod aortic sclerosis w/o stenosis; c. echo 09/2014: EF 55-60%, select images w/ inf HK; d. 10/2015 Echo: Ef 60-65%   Past Surgical History:  Procedure Laterality Date   CARDIAC CATHETERIZATION N/A 12/03/2014   Procedure: Left Heart Cath and Coronary  Angiography;  Surgeon: Minna Merritts, MD;  Location: [place] [facility];  Service: Cardiovascular;  Laterality: N/A;   COLONOSCOPY WITH PROPOFOL N/A 11/23/2015   Procedure: COLONOSCOPY WITH PROPOFOL;  Surgeon: [name], MD;  Location: [facility];  Service: Endoscopy;  Laterality: N/A;   COLONOSCOPY WITH PROPOFOL N/A 05/04/2017   Procedure: COLONOSCOPY WITH PROPOFOL;  Surgeon: [name], MD;  Location: [place];  Service: Endoscopy;  Laterality: N/A;   ESOPHAGOGASTRODUODENOSCOPY (EGD) WITH PROPOFOL N/A 11/23/2015   Procedure: ESOPHAGOGASTRODUODENOSCOPY (EGD) WITH PROPOFOL;  Surgeon: [name], MD;  Location: [facility];  Service: Endoscopy;  Laterality: N/A;   HAND SURGERY Left    PERIPHERAL VASCULAR CATHETERIZATION N/A 11/17/2015   Procedure: Upper Extremity Angiography;  Surgeon: Wellington Hampshire, MD;  Location: [place] [facility];  Service: Cardiovascular;  Laterality: N/A;   POLYPECTOMY  05/04/2017   Procedure: POLYPECTOMY;  Surgeon: [name], MD;  Location: [place];  Service: Endoscopy;;    Allergies  No Known Allergies  History of Present Illness    58 year old male with the above complex past medical history including paroxysmal atrial flutter and tachycardia induced cardiomyopathy which were diagnosed in March 2016 with an EF of 20 to 25% at that time.  Subsequently underwent TEE and cardioversion and has since successfully maintained sinus rhythm.  Follow-up echo in May 2016 showed normalization of LV function and EF remained normal at 60-65% by echo in June 2017.  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  In February 2018, he had some left-sided facial numbness and was worked up for possible  neurologic causes.  He was seen by neurology and an MRI was reportedly performed and reportedly normal.  Ultimately, it was felt that he had trigeminal neuralgia.    He was last seen in cardiology clinic in December 2019 and since then, he notes that he has done well.  He walks some and denies chest pain.  He does have some degree of chronic dyspnea on exertion in the setting of interstitial lung disease, and this is unchanged over the years.  He sometimes notes elevated heart rates into the 80s but to the best of his knowledge, he has not had any recurrent atrial flutter.  He denies PND, orthopnea, dizziness, syncope, edema, or early satiety.  He is pending dental implants on the left and right though he plans to have the left done first.  In that setting, he will have to come off of Coumadin for a few days.  Home Medications    Prior to Admission medications   Medication Sig Start Date End Date Taking? Authorizing Provider  albuterol (PROVENTIL HFA;VENTOLIN HFA) 108 (90 Base) MCG/ACT inhaler Inhale 2 puffs into the lungs 4 (four) times daily as needed for wheezing or shortness of breath. 05/21/17  Yes Juline Patch, MD  diltiazem (CARDIZEM CD) 120 MG 24 hr capsule TAKE (1) CAPSULE BY MOUTH EVERY DAY 09/12/18  Yes Gollan, Kathlene November, MD  docusate sodium (COLACE) 100 MG capsule Take 1 tablet once or twice daily as needed for constipation while taking narcotic pain medicine 07/09/18  Yes Hinda Kehr, MD  enalapril (VASOTEC) 20 MG tablet Take 1 tablet (20 mg total) by mouth daily. 09/12/18  Yes Gollan, Kathlene November, MD  fluticasone (FLONASE) 50 MCG/ACT nasal spray Place 2 sprays into both nostrils daily. 06/10/15  Yes Crecencio Mc P, PA-C  Fluticasone-Salmeterol (ADVAIR DISKUS) 250-50 MCG/DOSE AEPB TAKE 1 PUFF BY MOUTH TWICE A DAY *RINSE MOUTH AFTER USE* 08/21/17  Yes Juline Patch, MD  folic acid (FOLVITE) 1 MG tablet Take 1 tablet (1 mg total) by mouth daily. 02/13/17  Yes Cammie Sickle, MD    hydrochlorothiazide (HYDRODIURIL) 25 MG tablet TAKE 1 TABLET (25 MG TOTAL) BY MOUTH DAILY. 12/03/18  Yes Minna Merritts, MD  HYDROcodone-acetaminophen (NORCO/VICODIN) 5-325 MG tablet Take 2 tablets by mouth every 6 (six) hours as needed for moderate pain or severe pain. 07/09/18  Yes Hinda Kehr, MD  hydroxychloroquine (PLAQUENIL) 200 MG tablet Take 400 mg by mouth 2 (two) times daily. Dr Delmer Islam 12/27/15  Yes [provider]  meclizine (ANTIVERT) 25 MG tablet Take 1 tablet (25 mg total) by mouth 3 (three) times daily as needed for dizziness or nausea. 04/27/17  Yes Lisa Roca, MD  meloxicam (MOBIC) 15 MG tablet Take 1 tablet (15 mg total) by mouth daily. 06/18/15  Yes Stover, Titorya, DPM  metoprolol tartrate (LOPRESSOR) 50 MG tablet Take 1.5 tablets (75 mg total) by mouth 2 (two) times daily. 09/12/18  Yes Gollan, Kathlene November, MD  Multiple Vitamin (MULTI-VITAMINS) TABS Take by mouth.   Yes [provider]  mycophenolate (CELLCEPT) 500 MG tablet Take 1,000 mg by mouth 2 (two) times daily.  02/14/16  Yes [provider]  pantoprazole (PROTONIX) 40 MG tablet TAKE 1 TABLET (40 MG TOTAL) BY MOUTH DAILY. Patient taking differently: Take 40 mg by mouth 2 (two) times  daily.  08/07/17  Yes [name], MD  potassium chloride SA (KLOR-CON M20) 20 MEQ tablet Take 1 tablet (20 mEq total) by mouth daily. 10/10/18  Yes Gollan, Kathlene November, MD  predniSONE (DELTASONE) 10 MG tablet Take 6 tabs (60 mg) PO x 3 days, then take 4 tabs (40 mg) PO x 3 days, then take 2 tabs (20 mg) PO x 3 days, then take 1 tab (10 mg) PO x 3 days, then take 1/2 tab (5 mg) PO x 4 days. 07/09/18  Yes Hinda Kehr, MD  predniSONE (DELTASONE) 5 MG tablet  09/27/18  Yes [provider]  warfarin (COUMADIN) 5 MG tablet TAKE AS DIRECTED BY COUMADIN CLINIC 09/04/18  Yes Gollan, Kathlene November, MD    Review of Systems    Overall doing well.  He has some degree of chronic dyspnea on exertion which has been stable.  He  denies chest pain, PND, orthopnea, dizziness, syncope, edema, or early satiety.  All other systems reviewed and are otherwise negative except as noted above.  Physical Exam    VS:  BP (!) 144/70 (BP Location: Left Arm, Patient Position: Sitting, Cuff Size: Normal)    Pulse 82    Ht 5\' 10"  (1.778 m)    Wt 235 lb 6.4 oz (106.8 kg)    SpO2 96%    BMI 33.78 kg/m  , BMI Body mass index is 33.78 kg/m. GEN: Well nourished, well developed, in no acute distress. HEENT: normal. Neck: Supple, no JVD, carotid bruits, or masses. Cardiac: RRR, no murmurs, rubs, or gallops. No clubbing, cyanosis, edema.  Radials/DP/PT 2+ and equal bilaterally.  Respiratory:  Respirations regular and unlabored, clear to auscultation bilaterally. GI: Soft, nontender, nondistended, BS + x 4. MS: no deformity or atrophy. Skin: warm and dry, no rash. Neuro:  Strength and sensation are intact. Psych: Normal affect.  Accessory Clinical Findings    ECG personally reviewed by me today -regular sinus rhythm, 79, PVC, left axis deviation, left anterior fascicular block, left atrial enlargement, LVH, abnormal R wave progression - no acute changes.  Lab Results  Component Value Date   WBC 10.1 11/26/2018   HGB 12.9 (L) 11/26/2018   HCT 42.2 11/26/2018   MCV 87.9 11/26/2018   PLT 199 11/26/2018   Lab Results  Component Value Date   CREATININE 1.19 08/26/2018   BUN 22 (H) 08/26/2018   NA 141 08/26/2018   K 3.4 (L) 08/26/2018   CL 106 08/26/2018   CO2 26 08/26/2018   Lab Results  Component Value Date   ALT 19 08/26/2018   AST 20 08/26/2018   ALKPHOS 45 08/26/2018   BILITOT 0.3 08/26/2018   Lab Results  Component Value Date   CHOL 88 08/25/2014   HDL 24 (L) 08/25/2014   LDLCALC 39 08/25/2014   TRIG 124 08/25/2014     Assessment & Plan    1.  Preoperative cardiovascular evaluation: Patient is pending dental implants.  He has done reasonably well from a cardiac standpoint and denies chest pain.  He is chronic,  stable dyspnea on exertion in the setting of interstitial lung disease.  He is on chronic warfarin therapy in the setting of atrial flutter with a CHA2DS2VASc of 2 (hypertension, vascular disease).  He has no prior history of stroke.  He may hold his warfarin 5 days prior to his dental implants without bridging with a plan to resume warfarin when felt to be feasible from a surgical standpoint postoperatively.  2.  Paroxysmal  atrial flutter: In sinus on beta-blocker and calcium channel blocker therapy.  He remains on warfarin and is followed in our Coumadin clinic.  May hold warfarin for 5 days prior to dental implants.  3.  Essential hypertension: Blood pressure elevated in clinic today at 144/70 though I repeated this and got 136/78.  He has a cuff @ home and I've asked him to start checking his blood pressure a few times a week and to contact us if pressures are consistently greater than 130, at which time we can consider further titration of beta-blocker therapy.  4.  Non-obstructive CAD:  No chest pain.  Cont  blocker.  LDL 81 in 05/2017.  5.  Disposition: Follow-up in clinic in 6 months or sooner if necessary.  Murray Hodgkins, NP 12/18/2018, 5:15 PM

## 2018-12-18 NOTE — Telephone Encounter (Signed)
   Primary Cardiologist:Timothy Rockey Situ, MD  Chart reviewed as part of pre-operative protocol coverage. Because of [name]'s past medical history and time since last visit, he/she will require a follow-up visit in order to better assess preoperative cardiovascular risk.  Pre-op covering staff: - Please schedule appointment and call patient to inform them. - Please contact requesting surgeon's office via preferred method (i.e, phone, fax) to inform them of need for appointment prior to surgery.  If applicable, this message will also be routed to pharmacy pool and/or primary cardiologist for input on holding anticoagulant/antiplatelet agent as requested below so that this information is available at time of patient's appointment.   Kathyrn Drown, NP  12/18/2018, 9:01 AM

## 2018-12-18 NOTE — Telephone Encounter (Signed)
    COVID-19 Pre-Screening Questions:  PT ANSWERED NO TO ALL QUESTIONS   . In the past 7 to 10 days have you had a cough,  shortness of breath, headache, congestion, fever (100 or greater) body aches, chills, sore throat, or sudden loss of taste or sense of smell? . Have you been around anyone with known Covid 19. . Have you been around anyone who is awaiting Covid 19 test results in the past 7 to 10 days? . Have you been around anyone who has been exposed to Covid 19, or has mentioned symptoms of Covid 19 within the past 7 to 10 days?  If you have any concerns/questions about symptoms patients report during screening (either on the phone or at threshold). Contact the provider seeing the patient or DOD for further guidance.  If neither are available contact a member of the leadership team.          +

## 2018-12-18 NOTE — Patient Instructions (Signed)
Medication Instructions:  Your physician recommends that you continue on your current medications as directed. Please refer to the Current Medication list given to you today.  If you need a refill on your cardiac medications before your next appointment, please call your pharmacy.   Lab work: None ordered  If you have labs (blood work) drawn today and your tests are completely normal, you will receive your results only by: . MyChart Message (if you have MyChart) OR . A paper copy in the mail If you have any lab test that is abnormal or we need to change your treatment, we will call you to review the results.  Testing/Procedures: None ordered   Follow-Up: At [facility], you and your health needs are our priority.  As part of our continuing mission to provide you with exceptional heart care, we have created designated Provider Care Teams.  These Care Teams include your primary Cardiologist (physician) and Advanced Practice Providers (APPs -  Physician Assistants and Nurse Practitioners) who all work together to provide you with the care you need, when you need it. You will need a follow up appointment in 6 months.  Please call our office 2 months in advance to schedule this appointment.  You may see Timothy Gollan, MD or Christopher Berge, NP  

## 2018-12-19 NOTE — Telephone Encounter (Signed)
   Primary Cardiologist: Ida Rogue, MD  Chart reviewed as part of pre-operative protocol coverage. Patient was contacted 12/19/2018 in reference to pre-operative risk assessment for pending surgery as outlined below.  Marvell Tamer was last seen on 12/19/2018 by Murray Hodgkins, NP.  Deshone Lyssy was doing well from a cardiac perspective and was cleared for dental implant.   Per pharmacy: He has a diagnosis of atrial fibrillation on warfarin for anticoagulation.    Procedure: dental implant Date of procedure: TBD  CHADS2-VASc score of  2 (CHF, HTN,)  CrCl 101.2 Platelet count 199  We normally do not recommend holding anticoagulation for single tooth extractions.  If you feel the risk of bleeding is higher with single implant placement, would recommend holding warfarin for 2 days prior to appointment.  If multiple implant placements will occur, it is okay to hold warfarin for 5 days without need for lovenox bridging.    Note: Chart had indicated patient history of stroke. Patient saw Ignacia Bayley PA and discussed:  "In 06/2016 he saw his pcp for left sided facial numbness. He then saw neuro @ [facility] and though they thought that it was trigeminal neuralgia, they added cva to his problem list as an indication for the mri that they wanted. I can't find the mri in care everywhere or epic, but he hasn't had f/u w/ neuro since and believes that that work up was neg... so no h/o stroke. I'll remove it from his problem list."  Therefore, based on ACC/AHA guidelines, the patient would be at acceptable risk for the planned procedure without further cardiovascular testing.   I will route this recommendation to the requesting party via Epic fax function and remove from pre-op pool.  Please call with questions.  Kathyrn Drown, NP 12/19/2018, 7:53 AM

## 2018-12-30 ENCOUNTER — Telehealth: Payer: Self-pay

## 2018-12-30 NOTE — Telephone Encounter (Signed)

## 2018-12-31 NOTE — Addendum Note (Signed)
Addended by: Alba Destine on: 12/31/2018 07:57 AM   Modules accepted: Orders

## 2019-01-01 ENCOUNTER — Ambulatory Visit (INDEPENDENT_AMBULATORY_CARE_PROVIDER_SITE_OTHER): Payer: Medicare HMO

## 2019-01-01 ENCOUNTER — Other Ambulatory Visit: Payer: Self-pay

## 2019-01-01 DIAGNOSIS — Z5181 Encounter for therapeutic drug level monitoring: Secondary | ICD-10-CM | POA: Diagnosis not present

## 2019-01-01 DIAGNOSIS — I483 Typical atrial flutter: Secondary | ICD-10-CM

## 2019-01-01 DIAGNOSIS — I4892 Unspecified atrial flutter: Secondary | ICD-10-CM | POA: Diagnosis not present

## 2019-01-01 LAB — POCT INR: INR: 3.3 — AB (ref 2.0–3.0)

## 2019-01-01 NOTE — Patient Instructions (Signed)
Please take 1/2 tablet tonight, then continue dosage of 1 tablet daily except 1/2 tablet on Mondays  & Fridays.   Please be consistent with your green leafy vegetables and green tea.  Recheck in 6 weeks.

## 2019-02-12 ENCOUNTER — Other Ambulatory Visit: Payer: Self-pay

## 2019-02-12 ENCOUNTER — Ambulatory Visit (INDEPENDENT_AMBULATORY_CARE_PROVIDER_SITE_OTHER): Payer: Medicare HMO

## 2019-02-12 DIAGNOSIS — I4892 Unspecified atrial flutter: Secondary | ICD-10-CM | POA: Diagnosis not present

## 2019-02-12 DIAGNOSIS — I483 Typical atrial flutter: Secondary | ICD-10-CM | POA: Diagnosis not present

## 2019-02-12 DIAGNOSIS — Z5181 Encounter for therapeutic drug level monitoring: Secondary | ICD-10-CM | POA: Diagnosis not present

## 2019-02-12 LAB — POCT INR: INR: 3 (ref 2.0–3.0)

## 2019-02-12 NOTE — Patient Instructions (Signed)
Please continue dosage of 1 tablet daily except 1/2 tablet on Mondays  & Fridays.   Please be consistent with your green leafy vegetables and green tea.  Recheck in 6 weeks. 

## 2019-02-25 ENCOUNTER — Inpatient Hospital Stay: Payer: Medicare HMO | Attending: Hematology and Oncology

## 2019-03-06 ENCOUNTER — Other Ambulatory Visit: Payer: Self-pay | Admitting: Cardiovascular Disease

## 2019-03-06 NOTE — Telephone Encounter (Signed)
Please review for refill. Thanks!  

## 2019-03-26 ENCOUNTER — Ambulatory Visit (INDEPENDENT_AMBULATORY_CARE_PROVIDER_SITE_OTHER): Payer: Medicare HMO

## 2019-03-26 ENCOUNTER — Other Ambulatory Visit: Payer: Self-pay

## 2019-03-26 DIAGNOSIS — Z5181 Encounter for therapeutic drug level monitoring: Secondary | ICD-10-CM | POA: Diagnosis not present

## 2019-03-26 DIAGNOSIS — I483 Typical atrial flutter: Secondary | ICD-10-CM

## 2019-03-26 DIAGNOSIS — I4892 Unspecified atrial flutter: Secondary | ICD-10-CM | POA: Diagnosis not present

## 2019-03-26 LAB — POCT INR: INR: 3.2 — AB (ref 2.0–3.0)

## 2019-03-26 NOTE — Patient Instructions (Signed)
Please take 1/2 tablet tonight, then continue dosage of 1 tablet daily except 1/2 tablet on Mondays  & Fridays.   Please be consistent with your green leafy vegetables and green tea.  Recheck in 6 weeks. 

## 2019-05-07 ENCOUNTER — Other Ambulatory Visit: Payer: Self-pay

## 2019-05-07 ENCOUNTER — Ambulatory Visit (INDEPENDENT_AMBULATORY_CARE_PROVIDER_SITE_OTHER): Payer: Medicare HMO

## 2019-05-07 DIAGNOSIS — I483 Typical atrial flutter: Secondary | ICD-10-CM | POA: Diagnosis not present

## 2019-05-07 DIAGNOSIS — I4892 Unspecified atrial flutter: Secondary | ICD-10-CM | POA: Diagnosis not present

## 2019-05-07 DIAGNOSIS — Z5181 Encounter for therapeutic drug level monitoring: Secondary | ICD-10-CM

## 2019-05-07 LAB — POCT INR: INR: 2 (ref 2.0–3.0)

## 2019-05-07 NOTE — Patient Instructions (Signed)
Please continue dosage of 1 tablet daily except 1/2 tablet on Mondays  & Fridays.   Please be consistent with your green leafy vegetables and green tea.  Recheck in 6 weeks. 

## 2019-05-20 ENCOUNTER — Other Ambulatory Visit: Payer: Self-pay | Admitting: Cardiovascular Disease

## 2019-05-20 DIAGNOSIS — I10 Essential (primary) hypertension: Secondary | ICD-10-CM

## 2019-05-21 NOTE — Telephone Encounter (Signed)
Warfarin refill request.

## 2019-05-26 ENCOUNTER — Other Ambulatory Visit: Payer: Self-pay

## 2019-05-26 NOTE — Progress Notes (Signed)
Patient pre screened for office appointment, no questions or concerns today. Patient reminded of upcoming appointment time and date. 

## 2019-05-27 ENCOUNTER — Inpatient Hospital Stay: Payer: Medicare HMO | Attending: Internal Medicine | Admitting: Internal Medicine

## 2019-05-27 ENCOUNTER — Other Ambulatory Visit: Payer: Self-pay

## 2019-05-27 ENCOUNTER — Inpatient Hospital Stay: Payer: Medicare HMO

## 2019-05-27 DIAGNOSIS — I10 Essential (primary) hypertension: Secondary | ICD-10-CM | POA: Insufficient documentation

## 2019-05-27 DIAGNOSIS — D591 Autoimmune hemolytic anemia, unspecified: Secondary | ICD-10-CM | POA: Insufficient documentation

## 2019-05-27 DIAGNOSIS — Z87891 Personal history of nicotine dependence: Secondary | ICD-10-CM | POA: Diagnosis not present

## 2019-05-27 DIAGNOSIS — D594 Other nonautoimmune hemolytic anemias: Secondary | ICD-10-CM | POA: Diagnosis not present

## 2019-05-27 DIAGNOSIS — Z79899 Other long term (current) drug therapy: Secondary | ICD-10-CM | POA: Diagnosis not present

## 2019-05-27 DIAGNOSIS — Z7901 Long term (current) use of anticoagulants: Secondary | ICD-10-CM | POA: Diagnosis not present

## 2019-05-27 DIAGNOSIS — I4891 Unspecified atrial fibrillation: Secondary | ICD-10-CM | POA: Diagnosis not present

## 2019-05-27 DIAGNOSIS — D5919 Other autoimmune hemolytic anemia: Secondary | ICD-10-CM

## 2019-05-27 LAB — CBC WITH DIFFERENTIAL/PLATELET
Abs Immature Granulocytes: 0.12 10*3/uL — ABNORMAL HIGH (ref 0.00–0.07)
Basophils Absolute: 0.1 10*3/uL (ref 0.0–0.1)
Basophils Relative: 1 %
Eosinophils Absolute: 0.2 10*3/uL (ref 0.0–0.5)
Eosinophils Relative: 2 %
HCT: 43.6 % (ref 39.0–52.0)
Hemoglobin: 12.7 g/dL — ABNORMAL LOW (ref 13.0–17.0)
Immature Granulocytes: 1 %
Lymphocytes Relative: 11 %
Lymphs Abs: 1.3 10*3/uL (ref 0.7–4.0)
MCH: 25.8 pg — ABNORMAL LOW (ref 26.0–34.0)
MCHC: 29.1 g/dL — ABNORMAL LOW (ref 30.0–36.0)
MCV: 88.6 fL (ref 80.0–100.0)
Monocytes Absolute: 1 10*3/uL (ref 0.1–1.0)
Monocytes Relative: 8 %
Neutro Abs: 9.6 10*3/uL — ABNORMAL HIGH (ref 1.7–7.7)
Neutrophils Relative %: 77 %
Platelets: 225 10*3/uL (ref 150–400)
RBC: 4.92 MIL/uL (ref 4.22–5.81)
RDW: 14.4 % (ref 11.5–15.5)
WBC: 12.3 10*3/uL — ABNORMAL HIGH (ref 4.0–10.5)
nRBC: 0 % (ref 0.0–0.2)

## 2019-05-27 LAB — LACTATE DEHYDROGENASE: LDH: 154 U/L (ref 98–192)

## 2019-05-27 NOTE — Progress Notes (Signed)
[facility] OFFICE PROGRESS NOTE  Patient Care Team: Nelwyn Salisbury, Hershal Coria as PCP - General (Physician Assistant) Minna Merritts, MD as PCP - Cardiology (Cardiology) Minna Merritts, MD as Consulting Physician (Cardiology)   Oncology History   No history exists.   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  male pleasant patient above history of Autoimmune hemolytic anemia- currently on prednisone 10 mg/CellCept 500 twice a day [also for Autoimmune/connective tissue disorder]- is here for follow-up.   Admits to gaining weight.  Denies any swelling in the legs but denies any nausea vomiting but no blood in stools or black or stools.   Review of Systems  Constitutional: Negative for chills, diaphoresis, fever, malaise/fatigue and weight loss.  HENT: Negative for nosebleeds and sore throat.   Eyes: Negative for double vision.  Respiratory: Negative for cough, hemoptysis, sputum production, shortness of breath and wheezing.   Cardiovascular: Negative for chest pain, palpitations, orthopnea and leg swelling.  Gastrointestinal: Negative for abdominal pain, blood in stool, constipation, diarrhea, heartburn, melena, nausea and vomiting.  Genitourinary: Negative for dysuria, frequency and urgency.  Musculoskeletal: Negative for back pain and joint pain.  Skin: Negative.  Negative for itching and rash.  Neurological: Negative for dizziness, tingling, focal weakness, weakness and headaches.  Endo/Heme/Allergies: Does not bruise/bleed easily.  Psychiatric/Behavioral: Negative for depression. The  patient is not nervous/anxious and does not have insomnia.      PAST MEDICAL HISTORY :  Past Medical History:  Diagnosis Date  . Anemia 10/2015  . Gangrene of finger ([place])   . GERD (gastroesophageal reflux disease)   . Gout   . Hemolytic anemia ([place])   . History of nuclear stress test    a. 09/02/2014: no sig ischemia, GI uptake noted, no sig WMA, EF 48%, no EKG chanes concerning for ischemia, low risk scan    . HTN (hypertension)   . Normal coronary arteries    a. cardiac cath 12/03/2014: no sigificant CAD, right dominant system, LVEF >55%, no MR or AS  . Paroxysmal atrial flutter (HCC)    a. s/p successful TEE/DCCV on 08/28/2014-->was on xarelto, now on warfarin.  . Pulmonary fibrosis ([place])    a. not on home oxygen  . RA (rheumatoid arthritis) ([place])   . 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 echo 07/2014: EF 20-25%, cannot exclude atrial flutter,  global HK, possible bicuspid Ao valve, mod reduced RV sys fxn, mild-mod aortic valve sclerosis/calcification, mod TR, mildly elevated RVSP; b. TEE 08/28/2014: EF 30-35%, no intracardic thrombus, mildly dilated LA/RA, mild TR, mild-mod aortic sclerosis w/o stenosis; c. echo 09/2014: EF 55-60%, select images w/ inf HK; d. 10/2015 Echo: Ef 60-65%    PAST SURGICAL HISTORY :   Past Surgical History:  Procedure Laterality Date  . CARDIAC CATHETERIZATION N/A 12/03/2014   Procedure: Left Heart Cath and Coronary Angiography;  Surgeon: Minna Merritts, MD;  Location: [facility];  Service: Cardiovascular;  Laterality: N/A;  . COLONOSCOPY WITH PROPOFOL N/A 11/23/2015   Procedure: COLONOSCOPY WITH PROPOFOL;  Surgeon: [name], MD;  Location: [facility];  Service: Endoscopy;  Laterality: N/A;  . COLONOSCOPY WITH PROPOFOL N/A 05/04/2017   Procedure: COLONOSCOPY  WITH PROPOFOL;  Surgeon: [name], MD;  Location: [facility];  Service: Endoscopy;  Laterality: N/A;  . ESOPHAGOGASTRODUODENOSCOPY (EGD) WITH PROPOFOL N/A 11/23/2015    Procedure: ESOPHAGOGASTRODUODENOSCOPY (EGD) WITH PROPOFOL;  Surgeon: [name], MD;  Location: [facility];  Service: Endoscopy;  Laterality: N/A;  . HAND SURGERY Left   . PERIPHERAL VASCULAR CATHETERIZATION N/A 11/17/2015   Procedure: Upper Extremity Angiography;  Surgeon: Wellington Hampshire, MD;  Location: [facility];  Service: Cardiovascular;  Laterality: N/A;  . POLYPECTOMY  05/04/2017   Procedure: POLYPECTOMY;  Surgeon: [name], MD;  Location: [facility];  Service: Endoscopy;;    FAMILY HISTORY :   Family History  Problem Relation Age of Onset  . Leukemia Mother     SOCIAL HISTORY:   Social History   Tobacco Use  . Smoking status: Former Smoker    Packs/day: 1.00    Years: 20.00    Pack years: 20.00    Types: Cigarettes    Quit date: 08/30/2004    Years since quitting: 14.7  . Smokeless tobacco: Never Used  Substance Use Topics  . Alcohol use: No  . Drug use: No    ALLERGIES:  has No Known Allergies.  MEDICATIONS:  Current Outpatient Medications  Medication Sig Dispense Refill  . albuterol (PROVENTIL HFA;VENTOLIN HFA) 108 (90 Base) MCG/ACT inhaler Inhale 2 puffs into the lungs 4 (four) times daily as needed for wheezing or shortness of breath. 18 Inhaler 5  . diltiazem (CARDIZEM CD) 120 MG 24 hr capsule TAKE 1 CAPSULE EVERY DAY 90 capsule 0  . docusate sodium (COLACE) 100 MG capsule Take 1 tablet once or twice daily as needed for constipation while taking narcotic pain medicine 30 capsule 0  . enalapril (VASOTEC) 20 MG tablet Take 1 tablet (20 mg total) by mouth daily. 90 tablet 3  . fluticasone (FLONASE) 50 MCG/ACT nasal spray Place 2 sprays into both nostrils daily. 16 g 0  . Fluticasone-Salmeterol (ADVAIR DISKUS) 250-50 MCG/DOSE AEPB TAKE 1 PUFF BY MOUTH TWICE A DAY *RINSE MOUTH AFTER USE* 14 each 0  . folic acid (FOLVITE) 1 MG tablet Take 1 tablet (1 mg total) by mouth daily. 90 tablet 1  . hydrochlorothiazide (HYDRODIURIL) 25 MG tablet TAKE 1  TABLET EVERY DAY 90 tablet 0  . HYDROcodone-acetaminophen (NORCO/VICODIN) 5-325 MG tablet Take 2 tablets by mouth every 6 (six) hours as needed for moderate pain or severe pain. 16 tablet 0  . hydroxychloroquine (PLAQUENIL) 200 MG tablet Take 400 mg by mouth 2 (two) times daily. Dr Delmer Islam    . meclizine (ANTIVERT) 25 MG tablet Take 1 tablet (25 mg total) by mouth 3 (three) times daily as needed for dizziness or nausea. 20 tablet 0  . meloxicam (MOBIC) 15 MG tablet Take 1 tablet (15 mg total) by mouth daily. 30 tablet 0  . metoprolol tartrate (LOPRESSOR) 50 MG tablet Take 1.5 tablets (75 mg total) by mouth 2 (two) times daily. 270 tablet 2  . Multiple Vitamin (MULTI-VITAMINS) TABS Take by mouth.    . mycophenolate (CELLCEPT) 500 MG tablet Take 1,000 mg by mouth 2 (two) times daily.   11  . pantoprazole (PROTONIX) 40 MG tablet TAKE 1 TABLET (40 MG TOTAL) BY MOUTH DAILY. (Patient taking differently: Take 40 mg by mouth 2 (two) times daily. ) 30 tablet 6  . potassium chloride SA (KLOR-CON M20) 20 MEQ tablet Take 1 tablet (20 mEq total) by mouth daily. 90 tablet 3  . predniSONE (DELTASONE)  10 MG tablet Take 6 tabs (60 mg) PO x 3 days, then take 4 tabs (40 mg) PO x 3 days, then take 2 tabs (20 mg) PO x 3 days, then take 1 tab (10 mg) PO x 3 days, then take 1/2 tab (5 mg) PO x 4 days. 41 tablet 0  . predniSONE (DELTASONE) 5 MG tablet     . warfarin (COUMADIN) 5 MG tablet TAKE AS DIRECTED BY COUMADIN CLINIC 90 tablet 0   No current facility-administered medications for this visit.    PHYSICAL EXAMINATION:   BP (!) 154/91 (Patient Position: Sitting)   Pulse 71   Temp 98.4 F (36.9 C) (Tympanic)   Resp 20   Ht 5\' 10"  (1.778 m)   Wt 250 lb 9.6 oz (113.7 kg)   BMI 35.96 kg/m   Filed Weights   05/27/19 1015  Weight: 250 lb 9.6 oz (113.7 kg)   Physical Exam  Constitutional: He is oriented to person, place, and time and well-developed, well-nourished, and in no distress.  HENT:  Head:  Normocephalic and atraumatic.  Mouth/Throat: Oropharynx is clear and moist. No oropharyngeal exudate.  Eyes: Pupils are equal, round, and reactive to light.  Cardiovascular: Normal rate and regular rhythm.  Pulmonary/Chest: Effort normal and breath sounds normal. No respiratory distress. He has no wheezes.  Abdominal: Soft. Bowel sounds are normal. He exhibits no distension and no mass. There is no abdominal tenderness. There is no rebound and no guarding.  Musculoskeletal:        General: No tenderness or edema. Normal range of motion.     Cervical back: Normal range of motion and neck supple.  Neurological: He is alert and oriented to person, place, and time.  Skin: Skin is warm.  Psychiatric: Affect normal.    LABORATORY DATA:  I have reviewed the data as listed    Component Value Date/Time   NA 141 08/26/2018 0931   NA 138 12/11/2014 1518   NA 137 08/27/2014 0403   K 3.4 (L) 08/26/2018 0931   K 4.1 08/27/2014 0403   CL 106 08/26/2018 0931   CL 108 08/27/2014 0403   CO2 26 08/26/2018 0931   CO2 26 08/27/2014 0403   GLUCOSE 92 08/26/2018 0931   GLUCOSE 94 08/27/2014 0403   BUN 22 (H) 08/26/2018 0931   BUN 24 12/11/2014 1518   BUN 16 08/27/2014 0403   CREATININE 1.19 08/26/2018 0931   CREATININE 0.85 08/27/2014 0403   CALCIUM 9.3 08/26/2018 0931   CALCIUM 8.6 (L) 08/27/2014 0403   PROT 7.3 08/26/2018 0931   ALBUMIN 4.2 08/26/2018 0931   AST 20 08/26/2018 0931   ALT 19 08/26/2018 0931   ALKPHOS 45 08/26/2018 0931   BILITOT 0.3 08/26/2018 0931   GFRNONAA >60 08/26/2018 0931   GFRNONAA >60 08/27/2014 0403   GFRAA >60 08/26/2018 0931   GFRAA >60 08/27/2014 0403    No results found for: SPEP, UPEP  Lab Results  Component Value Date   WBC 12.3 (H) 05/27/2019   NEUTROABS 9.6 (H) 05/27/2019   HGB 12.7 (L) 05/27/2019   HCT 43.6 05/27/2019   MCV 88.6 05/27/2019   PLT 225 05/27/2019      Chemistry      Component Value Date/Time   NA 141 08/26/2018 0931   NA 138  12/11/2014 1518   NA 137 08/27/2014 0403   K 3.4 (L) 08/26/2018 0931   K 4.1 08/27/2014 0403   CL 106 08/26/2018 0931   CL 108  08/27/2014 0403   CO2 26 08/26/2018 0931   CO2 26 08/27/2014 0403   BUN 22 (H) 08/26/2018 0931   BUN 24 12/11/2014 1518   BUN 16 08/27/2014 0403   CREATININE 1.19 08/26/2018 0931   CREATININE 0.85 08/27/2014 0403      Component Value Date/Time   CALCIUM 9.3 08/26/2018 0931   CALCIUM 8.6 (L) 08/27/2014 0403   ALKPHOS 45 08/26/2018 0931   AST 20 08/26/2018 0931   ALT 19 08/26/2018 0931   BILITOT 0.3 08/26/2018 0931       RADIOGRAPHIC STUDIES: I have personally reviewed the radiological images as listed and agreed with the findings in the report. No results found.   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  # Hemoglobin today 12.7. LDH normal.  No evidence of hemolysis. STABLE.   #History of A. fib on Coumadin-STABLE.   # weight gain- sec to steroids- counseled  Re: weight loss.   # DISPOSITION: #  H&H/LDH in 3 months [[place]];  # follow up-MD/[place]-CBC/LDH in 6 months-Dr.B    Orders Placed This Encounter  Procedures  . Hematocrit ([facility])    Standing Status:   Future    Standing Expiration Date:   05/26/2020  . Hemoglobin [facility])    Standing Status:   Future    Standing Expiration Date:   05/26/2020  . Lactate dehydrogenase    Standing Status:   Future    Standing Expiration Date:   05/26/2020  . CBC with Differential    Standing Status:   Future    Standing Expiration Date:   05/26/2020  . Comprehensive metabolic panel    Standing Status:   Future    Standing Expiration Date:   05/26/2020   All questions were answered. The patient knows to call the clinic with any problems, questions or concerns.      Cammie Sickle, MD 05/27/2019 12:13 PM

## 2019-05-27 NOTE — Assessment & Plan Note (Addendum)
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  # Hemoglobin today 12.7. LDH normal.  No evidence of hemolysis. STABLE.   #History of A. fib on Coumadin-STABLE.   # weight gain- sec to steroids- counseled  Re: weight loss.   # DISPOSITION: #  H&H/LDH in 3 months [[place]];  # follow up-MD/[place]-CBC/LDH in 6 months-Dr.B

## 2019-06-18 ENCOUNTER — Other Ambulatory Visit: Payer: Self-pay

## 2019-06-18 ENCOUNTER — Ambulatory Visit (INDEPENDENT_AMBULATORY_CARE_PROVIDER_SITE_OTHER): Payer: Medicare HMO

## 2019-06-18 DIAGNOSIS — Z5181 Encounter for therapeutic drug level monitoring: Secondary | ICD-10-CM

## 2019-06-18 DIAGNOSIS — I483 Typical atrial flutter: Secondary | ICD-10-CM

## 2019-06-18 DIAGNOSIS — I4892 Unspecified atrial flutter: Secondary | ICD-10-CM | POA: Diagnosis not present

## 2019-06-18 LAB — POCT INR: INR: 2.3 (ref 2.0–3.0)

## 2019-06-18 NOTE — Patient Instructions (Signed)
Please continue dosage of 1 tablet daily except 1/2 tablet on Mondays  & Fridays.   Please be consistent with your green leafy vegetables and green tea.  Recheck in 6 weeks. 

## 2019-07-28 ENCOUNTER — Other Ambulatory Visit: Payer: Self-pay | Admitting: Cardiovascular Disease

## 2019-07-28 NOTE — Telephone Encounter (Signed)
Refill Request.  

## 2019-07-30 ENCOUNTER — Other Ambulatory Visit: Payer: Self-pay

## 2019-07-30 ENCOUNTER — Ambulatory Visit (INDEPENDENT_AMBULATORY_CARE_PROVIDER_SITE_OTHER): Payer: Medicare HMO

## 2019-07-30 DIAGNOSIS — Z5181 Encounter for therapeutic drug level monitoring: Secondary | ICD-10-CM

## 2019-07-30 DIAGNOSIS — I4892 Unspecified atrial flutter: Secondary | ICD-10-CM | POA: Diagnosis not present

## 2019-07-30 DIAGNOSIS — I483 Typical atrial flutter: Secondary | ICD-10-CM

## 2019-07-30 LAB — POCT INR: INR: 2.2 (ref 2.0–3.0)

## 2019-07-30 NOTE — Patient Instructions (Signed)
Please continue dosage of 1 tablet daily except 1/2 tablet on Mondays  & Fridays.   Please be consistent with your green leafy vegetables and green tea.  Recheck in 6 weeks. 

## 2019-08-08 ENCOUNTER — Ambulatory Visit: Payer: Medicare HMO | Attending: Internal Medicine

## 2019-08-08 DIAGNOSIS — Z23 Encounter for immunization: Secondary | ICD-10-CM

## 2019-08-08 NOTE — Progress Notes (Signed)
   Covid-19 Vaccination Clinic  Name:  Decklyn Hains    MRN: [id number] DOB: 1960-06-26  08/08/2019  Mr. Slomka was observed post Covid-19 immunization for 15 minutes without incident. He was provided with Vaccine Information Sheet and instruction to access the V-Safe system.   Mr. Frymire was instructed to call 911 with any severe reactions post vaccine: [name] Difficulty breathing  . Swelling of face and throat  . A fast heartbeat  . A bad rash all over body  . Dizziness and weakness   Immunizations Administered    Name Date Dose VIS Date Route   Pfizer COVID-19 Vaccine 08/08/2019  9:53 AM 0.3 mL 05/09/2019 Intramuscular   Manufacturer: [place]   Lot: [id number]   [place]: [id number]

## 2019-08-26 ENCOUNTER — Inpatient Hospital Stay: Payer: Medicare HMO | Attending: Hematology and Oncology

## 2019-08-26 DIAGNOSIS — Z7901 Long term (current) use of anticoagulants: Secondary | ICD-10-CM | POA: Insufficient documentation

## 2019-08-26 DIAGNOSIS — D594 Other nonautoimmune hemolytic anemias: Secondary | ICD-10-CM

## 2019-08-26 DIAGNOSIS — Z79899 Other long term (current) drug therapy: Secondary | ICD-10-CM | POA: Diagnosis not present

## 2019-08-26 DIAGNOSIS — I4891 Unspecified atrial fibrillation: Secondary | ICD-10-CM | POA: Insufficient documentation

## 2019-08-26 DIAGNOSIS — D591 Autoimmune hemolytic anemia, unspecified: Secondary | ICD-10-CM | POA: Diagnosis not present

## 2019-08-26 LAB — LACTATE DEHYDROGENASE: LDH: 165 U/L (ref 98–192)

## 2019-08-26 LAB — HEMATOCRIT: HCT: 42.6 % (ref 39.0–52.0)

## 2019-08-26 LAB — HEMOGLOBIN: Hemoglobin: 12.9 g/dL — ABNORMAL LOW (ref 13.0–17.0)

## 2019-09-02 ENCOUNTER — Ambulatory Visit: Payer: Medicare HMO | Attending: Internal Medicine

## 2019-09-02 DIAGNOSIS — Z23 Encounter for immunization: Secondary | ICD-10-CM

## 2019-09-02 NOTE — Progress Notes (Signed)
   Covid-19 Vaccination Clinic  Name:  [name]    MRN: [id number] DOB: Oct 03, 1960  09/02/2019  [name] was observed post Covid-19 immunization for 15 minutes without incident. He was provided with Vaccine Information Sheet and instruction to access the V-Safe system.   [name] was instructed to call 911 with any severe reactions post vaccine: [name] Difficulty breathing  . Swelling of face and throat  . A fast heartbeat  . A bad rash all over body  . Dizziness and weakness   Immunizations Administered    Name Date Dose VIS Date Route   Pfizer COVID-19 Vaccine 09/02/2019  9:31 AM 0.3 mL 05/09/2019 Intramuscular   Manufacturer: [place]   Lot: [id number]   [place]: [id number]

## 2019-09-10 ENCOUNTER — Ambulatory Visit (INDEPENDENT_AMBULATORY_CARE_PROVIDER_SITE_OTHER): Payer: Medicare HMO

## 2019-09-10 ENCOUNTER — Other Ambulatory Visit: Payer: Self-pay

## 2019-09-10 DIAGNOSIS — Z5181 Encounter for therapeutic drug level monitoring: Secondary | ICD-10-CM | POA: Diagnosis not present

## 2019-09-10 DIAGNOSIS — I483 Typical atrial flutter: Secondary | ICD-10-CM

## 2019-09-10 DIAGNOSIS — I4892 Unspecified atrial flutter: Secondary | ICD-10-CM | POA: Diagnosis not present

## 2019-09-10 LAB — POCT INR: INR: 2.7 (ref 2.0–3.0)

## 2019-09-10 NOTE — Patient Instructions (Signed)
Please continue dosage of 1 tablet daily except 1/2 tablet on Mondays  & Fridays.   Please be consistent with your green leafy vegetables and green tea.  Recheck in 6 weeks. 

## 2019-10-05 NOTE — Progress Notes (Signed)
Cardiology Office Note  Date:  10/06/2019   ID:  [name], DOB 1961/03/20, MRN [id number]  PCP:  Nelwyn Salisbury, PA-C   Chief Complaint  Patient presents with  . other    6 month follow up. Meds reviewed by the pt. verbally. Pt. c/o shortness of breath with over exertion.     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 Raul Del, stable has portable oxygen, did not bring it with him today Trouble with oxygen generator, working with [employer] stable  Retired, Works in yard  Allopurinol, some diarrhea No gout  Weight up 12 pounds since 04/2018, 233 to 245  On warfarin CBC stable  Lab work reviewed Total chol 183, LDL 105  EKG personally reviewed by myself on todays visit Shows normal sinus rhythm rate 70 bpm poor R wave progression through the anterior precordial leads, left axis deviation, LAD  Other past medical history reviewed On August 2017 he was in atrial flutter with rapid ventricular rate on anticoagulation, started on amiodarone converted to normal sinus rhythm, Amiodarone dose decreased  Prior history ofSores on fingers b/l, Severe symptoms on one of his fingers, nonhealing ulcerations Improved following vascularsurgery December 2017  presented to the hospital 11/22/2015 with weakness, fall,  hemoglobin of 5.5 Guiac negative, GI was consult and colonoscopy and EGD revealed no evidence of bleeding, Further evaluation by hematology confirmed hemolytic anemia, started on high-dose prednisone He did have transfusion 4  seen on a regular basis by Dr. Jefm Bryant for his mixed connective tissue disease Last visit January 2017 per the notes  Previous 30 day monitor that showed 3 episodes of PVCs in a bigeminal manner. He was asymptomatic. Arrhythmia sometimes in the morning, other times in the early evening. Was not on a consistent basis.  admitted to [facility] in April of 2016 for 3 week history of increased dyspnea and palpitations and found to be in new onset atrial flutter.  TTE showed EF 20-25%, global HK, possible bicuspid aortic valve.  successful TEE/DCCV on 4/1. 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   Repeat TTE showed improved EF of 55-60%, select images suggestive of hypokinesis of the inferior and posterior myocardium. LV diastolic parameters were normal. Left atrium was normal in size. RV function was normal. PASP was normal. He was advised further work up if he has symptoms.   On 11/27/14 while at work he suddenly developed onset of palpitations that led to increased SOB, especially with ambulation causing patient to leave work early. Patient checked his pulse and found it to be in the 140's.Pulse went back into the 60's to 80's without intervention.  Recurrent chest pain 7/5 he was woken up around 2 AM with left shoulder pain that radiated to his left chest.  He presented to [facility] for evaluation. Troponin was  found to be mildly elevated and flated trending 0.08-->0.05-->0.06-->0.07.  He underwent cardiac cath 48 hours which showed right dominant system, no significant CAD, normal LV gram with EF >55%  the patient reports hand sensitivity to cold which started  about 2 years ago and has been worsening. His fingers turn blue and her with significant pain when he is exposed to cold weather. He usually does better in the summertime. He developed an ulceration on the tip of the left small finger about 3 months ago which has gradually worsened with early gangrenous changes. No trauma.  he underwent upper extremity duplex which showed atypical flow in the ulnar artery and normal flow through the radial artery.  PMH:   has a past medical history of Anemia (10/2015), Gangrene of finger ([place]), GERD (gastroesophageal reflux disease), Gout, Hemolytic anemia ([place]), History of nuclear stress test, HTN (hypertension), Normal coronary arteries, Paroxysmal atrial flutter ([place]), Pulmonary fibrosis ([place]), RA (rheumatoid arthritis) ([place]), and Tachycardia-induced cardiomyopathy ([place]).  PSH:    Past Surgical History:  Procedure Laterality Date  . CARDIAC CATHETERIZATION N/A 12/03/2014   Procedure: Left Heart Cath and Coronary Angiography;  Surgeon: Minna Merritts, MD;  Location: [place] [facility];  Service: Cardiovascular;  Laterality: N/A;  . COLONOSCOPY WITH PROPOFOL N/A 11/23/2015   Procedure: COLONOSCOPY WITH PROPOFOL;  Surgeon: [name], MD;  Location: [facility]OS[facility];  Service: Endoscopy;  Laterality: N/A;  . COLONOSCOPY WITH PROPOFOL N/A 05/04/2017   Procedure: COLONOSCOPY WITH PROPOFOL;  Surgeon: [name], MD;  Location: [place];  Service: Endoscopy;  Laterality: N/A;  . ESOPHAGOGASTRODUODENOSCOPY (EGD) WITH PROPOFOL N/A 11/23/2015   Procedure: ESOPHAGOGASTRODUODENOSCOPY (EGD) WITH PROPOFOL;  Surgeon: [name], MD;  Location: [facility] ENDOSCOPY;  Service: Endoscopy;  Laterality: N/A;  . HAND SURGERY Left   . PERIPHERAL VASCULAR CATHETERIZATION N/A 11/17/2015   Procedure: Upper Extremity Angiography;  Surgeon: Wellington Hampshire, MD;  Location: [place] [facility];  Service: Cardiovascular;  Laterality: N/A;  . POLYPECTOMY  05/04/2017   Procedure:  POLYPECTOMY;  Surgeon: [name], MD;  Location: [place];  Service: Endoscopy;;    Current Outpatient Medications  Medication Sig Dispense Refill  . albuterol (PROVENTIL HFA;VENTOLIN HFA) 108 (90 Base) MCG/ACT inhaler Inhale 2 puffs into the lungs 4 (four) times daily as needed for wheezing or shortness of breath. 18 Inhaler 5  . allopurinol (ZYLOPRIM) 100 MG tablet Take 50 mg by mouth daily.     [name] diltiazem (CARDIZEM CD) 120 MG 24 hr capsule TAKE 1 CAPSULE EVERY DAY 30 capsule 0  . docusate sodium (COLACE) 100 MG capsule Take 1 tablet once or twice daily as needed for constipation while taking narcotic pain medicine 30 capsule 0  . enalapril (VASOTEC) 20 MG tablet Take 1 tablet (20 mg total) by mouth daily. 90 tablet 3  . fluticasone (FLONASE) 50 MCG/ACT nasal spray Place 2 sprays into both nostrils daily. 16 g 0  . Fluticasone-Salmeterol (ADVAIR DISKUS) 250-50 MCG/DOSE AEPB TAKE 1 PUFF BY MOUTH TWICE A DAY *RINSE MOUTH AFTER USE* 14 each 0  . folic acid (FOLVITE) 1 MG tablet Take 1 tablet (1 mg total) by mouth daily. 90 tablet 1  . hydrochlorothiazide (HYDRODIURIL) 25 MG tablet TAKE 1 TABLET EVERY DAY 90 tablet 0  . HYDROcodone-acetaminophen (NORCO/VICODIN) 5-325 MG tablet Take 2 tablets by mouth every 6 (six) hours as needed for moderate pain or severe pain. 16 tablet 0  . hydroxychloroquine (PLAQUENIL) 200 MG tablet Take 400 mg by mouth 2 (two)  times daily. Dr Delmer Islam    . meclizine (ANTIVERT) 25 MG tablet Take 1 tablet (25 mg total) by mouth 3 (three) times daily as needed for dizziness or nausea. 20 tablet 0  . meloxicam (MOBIC) 15 MG tablet Take 1 tablet (15 mg total) by mouth daily. 30 tablet 0  . metoprolol tartrate (LOPRESSOR) 50 MG tablet Take 1.5 tablets (75 mg total) by mouth 2 (two) times daily. 270 tablet 2  . Multiple Vitamin (MULTI-VITAMINS) TABS Take by mouth.    . mycophenolate (CELLCEPT) 500 MG tablet Take 1,000 mg by mouth 2 (two) times daily.   11  .  pantoprazole (PROTONIX) 40 MG tablet Take 40 mg by mouth 2 (two) times daily.    . potassium chloride SA (KLOR-CON M20) 20 MEQ tablet Take 1 tablet (20 mEq total) by mouth daily. 90 tablet 3  . predniSONE (DELTASONE) 10 MG tablet Take 6 tabs (60 mg) PO x 3 days, then take 4 tabs (40 mg) PO x 3 days, then take 2 tabs (20 mg) PO x 3 days, then take 1 tab (10 mg) PO x 3 days, then take 1/2 tab (5 mg) PO x 4 days. 41 tablet 0  . predniSONE (DELTASONE) 5 MG tablet     . warfarin (COUMADIN) 5 MG tablet TAKE AS DIRECTED BY COUMADIN CLINIC 90 tablet 0   No current facility-administered medications for this visit.     Allergies:   Allopurinol and Probenecid   Social History:  The patient  reports that he quit smoking about 15 years ago. His smoking use included cigarettes. He has a 20.00 pack-year smoking history. He has never used smokeless tobacco. He reports that he does not drink alcohol or use drugs.   Family History:   family history includes Leukemia in his mother.    Review of Systems: Review of Systems  Constitutional: Negative.   HENT: Negative.   Respiratory: Positive for shortness of breath.   Cardiovascular: Negative.   Gastrointestinal: Negative.   Musculoskeletal: Negative.   Neurological: Negative.   Psychiatric/Behavioral: Negative.   All other systems reviewed and are negative.   PHYSICAL EXAM: VS:  BP 124/80 (BP Location: Left Arm, Patient Position: Sitting, Cuff Size: Normal)   Pulse 70   Ht 5\' 10"  (1.778 m)   Wt 245 lb 6 oz (111.3 kg)   SpO2 91%   BMI 35.21 kg/m  , BMI Body mass index is 35.21 kg/m.  Constitutional:  oriented to person, place, and time. No distress.  HENT:  Head: Grossly normal Eyes:  no discharge. No scleral icterus.  Neck: No JVD, no carotid bruits  Cardiovascular: Regular rate and rhythm, no murmurs appreciated Pulmonary/Chest: Clear to auscultation bilaterally, no wheezes or rails Abdominal: Soft.  no distension.  no tenderness.   Musculoskeletal: Normal range of motion Neurological:  normal muscle tone. Coordination normal. No atrophy Skin: Skin warm and dry Psychiatric: normal affect, pleasant  Recent Labs: 05/27/2019: Platelets 225 08/26/2019: Hemoglobin 12.9    Lipid Panel Lab Results  Component Value Date   CHOL 88 08/25/2014   HDL 24 (L) 08/25/2014   LDLCALC 39 08/25/2014   TRIG 124 08/25/2014      Wt Readings from Last 3 Encounters:  10/06/19 245 lb 6 oz (111.3 kg)  05/27/19 250 lb 9.6 oz (113.7 kg)  12/18/18 235 lb 6.4 oz (106.8 kg)     ASSESSMENT AND PLAN:   Essential hypertension - Plan: EKG 12-Lead Blood pressure is well controlled in general and  on previous notes  no changes made to the medications.  Frequent PVCs - Plan: EKG 12-Lead continue beta blockers, asymptomatic, no further work-up  Shortness of breath - Plan: EKG 12-Lead Pulmonary fibrosis, followed by pulmonary on oxygen He denies significant progression  Typical atrial flutter (HCC) amiodarone stopped secondary to underlying lung disease Tolerating anticoagulation  maintaining normal sinus rhythm No medication changes made  Pulmonary fibrosis (HCC) Currently on oxygen by nasal cannula, on room air on today's visit Followed by Dr. Raul Del  Encounter for anticoagulation discussion and counseling No recent bleeding, tolerating warfarin  Disposition:   F/U  12 months   Total encounter time more than 25 minutes  Greater than 50% was spent in counseling and coordination of care with the patient    No orders of the defined types were placed in this encounter.    Signed, Esmond Plants, M.D., Ph.D. 10/06/2019  [place], [place]

## 2019-10-06 ENCOUNTER — Ambulatory Visit (INDEPENDENT_AMBULATORY_CARE_PROVIDER_SITE_OTHER): Payer: Medicare HMO | Admitting: Cardiovascular Disease

## 2019-10-06 ENCOUNTER — Other Ambulatory Visit: Payer: Self-pay

## 2019-10-06 ENCOUNTER — Encounter: Payer: Self-pay | Admitting: Cardiovascular Disease

## 2019-10-06 VITALS — BP 124/80 | HR 70 | Ht 70.0 in | Wt 245.4 lb

## 2019-10-06 DIAGNOSIS — I2510 Atherosclerotic heart disease of native coronary artery without angina pectoris: Secondary | ICD-10-CM

## 2019-10-06 DIAGNOSIS — I483 Typical atrial flutter: Secondary | ICD-10-CM | POA: Diagnosis not present

## 2019-10-06 DIAGNOSIS — J8410 Pulmonary fibrosis, unspecified: Secondary | ICD-10-CM | POA: Diagnosis not present

## 2019-10-06 DIAGNOSIS — I10 Essential (primary) hypertension: Secondary | ICD-10-CM

## 2019-10-06 DIAGNOSIS — I776 Arteritis, unspecified: Secondary | ICD-10-CM

## 2019-10-06 NOTE — Patient Instructions (Signed)

## 2019-10-12 ENCOUNTER — Other Ambulatory Visit: Payer: Self-pay | Admitting: Cardiovascular Disease

## 2019-10-13 ENCOUNTER — Other Ambulatory Visit: Payer: Self-pay | Admitting: Cardiovascular Disease

## 2019-10-13 NOTE — Telephone Encounter (Signed)
Refill Request.  

## 2019-10-22 ENCOUNTER — Ambulatory Visit (INDEPENDENT_AMBULATORY_CARE_PROVIDER_SITE_OTHER): Payer: Medicare HMO

## 2019-10-22 ENCOUNTER — Other Ambulatory Visit: Payer: Self-pay

## 2019-10-22 DIAGNOSIS — I4892 Unspecified atrial flutter: Secondary | ICD-10-CM | POA: Diagnosis not present

## 2019-10-22 DIAGNOSIS — I483 Typical atrial flutter: Secondary | ICD-10-CM

## 2019-10-22 DIAGNOSIS — Z5181 Encounter for therapeutic drug level monitoring: Secondary | ICD-10-CM | POA: Diagnosis not present

## 2019-10-22 LAB — POCT INR: INR: 2.5 (ref 2.0–3.0)

## 2019-10-22 NOTE — Patient Instructions (Signed)
Please continue dosage of 1 tablet daily except 1/2 tablet on Mondays  & Fridays.   Please be consistent with your green leafy vegetables and green tea.  Recheck in 6 weeks. 

## 2019-11-13 ENCOUNTER — Other Ambulatory Visit: Payer: Self-pay | Admitting: Cardiovascular Disease

## 2019-11-13 DIAGNOSIS — I10 Essential (primary) hypertension: Secondary | ICD-10-CM

## 2019-11-25 ENCOUNTER — Inpatient Hospital Stay (HOSPITAL_BASED_OUTPATIENT_CLINIC_OR_DEPARTMENT_OTHER): Payer: Medicare HMO | Admitting: Internal Medicine

## 2019-11-25 ENCOUNTER — Other Ambulatory Visit: Payer: Self-pay | Admitting: *Deleted

## 2019-11-25 ENCOUNTER — Other Ambulatory Visit: Payer: Self-pay

## 2019-11-25 ENCOUNTER — Inpatient Hospital Stay: Payer: Medicare HMO | Attending: Internal Medicine

## 2019-11-25 ENCOUNTER — Encounter: Payer: Self-pay | Admitting: Internal Medicine

## 2019-11-25 DIAGNOSIS — D594 Other nonautoimmune hemolytic anemias: Secondary | ICD-10-CM

## 2019-11-25 DIAGNOSIS — Z79899 Other long term (current) drug therapy: Secondary | ICD-10-CM | POA: Diagnosis not present

## 2019-11-25 DIAGNOSIS — Z7901 Long term (current) use of anticoagulants: Secondary | ICD-10-CM | POA: Insufficient documentation

## 2019-11-25 DIAGNOSIS — I4891 Unspecified atrial fibrillation: Secondary | ICD-10-CM | POA: Insufficient documentation

## 2019-11-25 DIAGNOSIS — D591 Autoimmune hemolytic anemia, unspecified: Secondary | ICD-10-CM | POA: Insufficient documentation

## 2019-11-25 LAB — COMPREHENSIVE METABOLIC PANEL
ALT: 17 U/L (ref 0–44)
AST: 16 U/L (ref 15–41)
Albumin: 3.7 g/dL (ref 3.5–5.0)
Alkaline Phosphatase: 45 U/L (ref 38–126)
Anion gap: 10 (ref 5–15)
BUN: 19 mg/dL (ref 6–20)
CO2: 29 mmol/L (ref 22–32)
Calcium: 8.9 mg/dL (ref 8.9–10.3)
Chloride: 101 mmol/L (ref 98–111)
Creatinine, Ser: 1.02 mg/dL (ref 0.61–1.24)
GFR calc Af Amer: >60 mL/min (ref >60)
GFR calc non Af Amer: >60 mL/min (ref >60)
Glucose, Bld: 95 mg/dL (ref 70–99)
Potassium: 3.6 mmol/L (ref 3.5–5.1)
Sodium: 140 mmol/L (ref 135–145)
Total Bilirubin: 0.4 mg/dL (ref 0.3–1.2)
Total Protein: 6.9 g/dL (ref 6.5–8.1)

## 2019-11-25 LAB — CBC WITH DIFFERENTIAL/PLATELET
Abs Immature Granulocytes: 0.15 10*3/uL — ABNORMAL HIGH (ref 0.00–0.07)
Basophils Absolute: 0.1 10*3/uL (ref 0.0–0.1)
Basophils Relative: 1 %
Eosinophils Absolute: 0.2 10*3/uL (ref 0.0–0.5)
Eosinophils Relative: 2 %
HCT: 42.1 % (ref 39.0–52.0)
Hemoglobin: 12.7 g/dL — ABNORMAL LOW (ref 13.0–17.0)
Immature Granulocytes: 1 %
Lymphocytes Relative: 13 %
Lymphs Abs: 1.6 10*3/uL (ref 0.7–4.0)
MCH: 26 pg (ref 26.0–34.0)
MCHC: 30.2 g/dL (ref 30.0–36.0)
MCV: 86.3 fL (ref 80.0–100.0)
Monocytes Absolute: 1.1 10*3/uL — ABNORMAL HIGH (ref 0.1–1.0)
Monocytes Relative: 9 %
Neutro Abs: 9.1 10*3/uL — ABNORMAL HIGH (ref 1.7–7.7)
Neutrophils Relative %: 74 %
Platelets: 187 10*3/uL (ref 150–400)
RBC: 4.88 MIL/uL (ref 4.22–5.81)
RDW: 14.9 % (ref 11.5–15.5)
WBC: 12.2 10*3/uL — ABNORMAL HIGH (ref 4.0–10.5)
nRBC: 0 % (ref 0.0–0.2)

## 2019-11-25 LAB — LACTATE DEHYDROGENASE: LDH: 132 U/L (ref 98–192)

## 2019-11-25 NOTE — Progress Notes (Signed)
[name] OFFICE PROGRESS NOTE  Patient Care Team: Nelwyn Salisbury, Hershal Coria as PCP - General (Physician Assistant) Minna Merritts, MD as PCP - Cardiology (Cardiology) Minna Merritts, MD as Consulting Physician (Cardiology)   Oncology History   No history exists.   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  male pleasant patient above history of Autoimmune hemolytic anemia- currently on prednisone 10 mg/CellCept 500 twice a day [also for Autoimmune/connective tissue disorder]- is here for follow-up.   Patient continues to gain weight because of long-term steroid use.  He is currently working with his insurance company weight loss program.  No worsening fatigue.  No fevers or chills.  No worsening shortness of breath or cough.  Chronic joint pains-not any worse.  Review of Systems  Constitutional: Negative for chills, diaphoresis, fever, malaise/fatigue and weight loss.  HENT: Negative for nosebleeds and sore throat.   Eyes: Negative for double vision.  Respiratory: Negative for cough, hemoptysis, sputum production, shortness of breath and wheezing.   Cardiovascular: Negative for chest pain, palpitations, orthopnea and leg swelling.  Gastrointestinal: Negative for abdominal pain, blood in stool, constipation, diarrhea, heartburn, melena, nausea and vomiting.  Genitourinary: Negative for dysuria, frequency and urgency.  Musculoskeletal: Positive for back pain and joint pain.  Skin: Negative.  Negative for itching and rash.  Neurological: Negative for dizziness, tingling, focal weakness,  weakness and headaches.  Endo/Heme/Allergies: Does not bruise/bleed easily.  Psychiatric/Behavioral: Negative for depression. The patient is not nervous/anxious and does not have insomnia.      PAST MEDICAL HISTORY :  Past Medical History:  Diagnosis Date  . Anemia 10/2015  . Gangrene of finger ([place])   . GERD (gastroesophageal reflux disease)   . Gout   . Hemolytic anemia ([place])   . History of nuclear stress test    a. 09/02/2014: no sig ischemia, GI uptake noted, no sig WMA, EF 48%, no EKG chanes concerning for ischemia, low risk scan    . HTN (hypertension)   . Normal coronary arteries    a. cardiac cath 12/03/2014: no sigificant CAD, right dominant system, LVEF >55%, no MR or AS  . Paroxysmal atrial flutter (HCC)    a. s/p successful TEE/DCCV on 08/28/2014-->was on xarelto, now on warfarin.  . Pulmonary fibrosis ([place])    a. not on home oxygen  . RA (rheumatoid arthritis) ([place])   . Tachycardia-induced cardiomyopathy ([place])    a. echo 07/2014: EF 20-25%, cannot exclude atrial flutter,  global HK, possible bicuspid Ao valve, mod reduced RV sys fxn, mild-mod aortic valve sclerosis/calcification, mod TR, mildly elevated RVSP; b. TEE 08/28/2014: EF 30-35%, no intracardic thrombus, mildly dilated LA/RA, mild TR, mild-mod aortic sclerosis w/o stenosis; c. echo 09/2014: EF 55-60%, select images w/ inf HK; d. 10/2015 Echo: Ef 60-65%    PAST SURGICAL HISTORY :   Past Surgical History:  Procedure Laterality Date  . CARDIAC CATHETERIZATION N/A 12/03/2014   Procedure: Left Heart Cath and Coronary Angiography;  Surgeon: Minna Merritts, MD;  Location: [place] [facility];  Service: Cardiovascular;  Laterality: N/A;  . COLONOSCOPY WITH PROPOFOL N/A 11/23/2015   Procedure: COLONOSCOPY WITH PROPOFOL;  Surgeon: [name], MD;  Location:  [facility];  Service: Endoscopy;  Laterality: N/A;  . COLONOSCOPY WITH PROPOFOL N/A 05/04/2017   Procedure: COLONOSCOPY WITH PROPOFOL;  Surgeon: [name], MD;   Location: [place];  Service: Endoscopy;  Laterality: N/A;  . ESOPHAGOGASTRODUODENOSCOPY (EGD) WITH PROPOFOL N/A 11/23/2015   Procedure: ESOPHAGOGASTRODUODENOSCOPY (EGD) WITH PROPOFOL;  Surgeon: [name], MD;  Location: [facility]OSCOPY;  Service: Endoscopy;  Laterality: N/A;  . HAND SURGERY Left   . PERIPHERAL VASCULAR CATHETERIZATION N/A 11/17/2015   Procedure: Upper Extremity Angiography;  Surgeon: Wellington Hampshire, MD;  Location: [place] [facility];  Service: Cardiovascular;  Laterality: N/A;  . POLYPECTOMY  05/04/2017   Procedure: POLYPECTOMY;  Surgeon: [name], MD;  Location: [place];  Service: Endoscopy;;    FAMILY HISTORY :   Family History  Problem Relation Age of Onset  . Leukemia Mother     SOCIAL HISTORY:   Social History   Tobacco Use  . Smoking status: Former Smoker    Packs/day: 1.00    Years: 20.00    Pack years: 20.00    Types: Cigarettes    Quit date: 08/30/2004    Years since quitting: 15.2  . Smokeless tobacco: Never Used  Vaping Use  . Vaping Use: Never used  Substance Use Topics  . Alcohol use: No  . Drug use: No    ALLERGIES:  is allergic to allopurinol and probenecid.  MEDICATIONS:  Current Outpatient Medications  Medication Sig Dispense Refill  . albuterol (PROVENTIL HFA;VENTOLIN HFA) 108 (90 Base) MCG/ACT inhaler Inhale 2 puffs into the lungs 4 (four) times daily as needed for wheezing or shortness of breath. 18 Inhaler 5  . allopurinol (ZYLOPRIM) 100 MG tablet Take 50 mg by mouth daily.     [name] diltiazem (CARDIZEM CD) 120 MG 24 hr capsule TAKE 1 CAPSULE EVERY DAY 90 capsule 3  . docusate sodium (COLACE) 100 MG capsule Take 1 tablet once or twice daily as needed for constipation while taking narcotic pain medicine 30 capsule 0  . enalapril (VASOTEC) 20 MG tablet TAKE 1 TABLET (20 MG TOTAL) BY MOUTH DAILY. 90 tablet 3  . fluticasone (FLONASE) 50 MCG/ACT nasal spray Place 2 sprays into both nostrils daily. 16 g 0  .  Fluticasone-Salmeterol (ADVAIR DISKUS) 250-50 MCG/DOSE AEPB TAKE 1 PUFF BY MOUTH TWICE A DAY *RINSE MOUTH AFTER USE* 14 each 0  . folic acid (FOLVITE) 1 MG tablet Take 1 tablet (1 mg total) by mouth daily. 90 tablet 1  . hydrochlorothiazide (HYDRODIURIL) 25 MG tablet TAKE 1 TABLET EVERY DAY 90 tablet 3  . HYDROcodone-acetaminophen (NORCO/VICODIN) 5-325 MG tablet Take 2 tablets by mouth every 6 (six) hours as needed for moderate pain or severe pain. 16 tablet 0  . hydroxychloroquine (PLAQUENIL) 200 MG tablet Take 400 mg by mouth 2 (two) times daily. Dr Delmer Islam    . meclizine (ANTIVERT) 25 MG tablet Take 1 tablet (25 mg total) by mouth 3 (three) times daily as needed for dizziness or nausea. 20 tablet 0  . meloxicam (MOBIC) 15 MG tablet Take 1 tablet (15 mg total) by mouth daily. 30 tablet 0  . metoprolol tartrate (LOPRESSOR) 50 MG tablet TAKE 1 AND 1/2 TABLETS (75 MG) BY MOUTH 2 (TWO) TIMES DAILY. 270 tablet 3  . Multiple Vitamin (MULTI-VITAMINS) TABS Take by mouth.    . mycophenolate (CELLCEPT) 500 MG tablet Take 1,000 mg by mouth 2 (two) times daily.   11  . pantoprazole (PROTONIX) 40 MG tablet Take 40 mg by  mouth 2 (two) times daily.    . potassium chloride SA (KLOR-CON M20) 20 MEQ tablet Take 1 tablet (20 mEq total) by mouth daily. 90 tablet 3  . predniSONE (DELTASONE) 10 MG tablet Take 6 tabs (60 mg) PO x 3 days, then take 4 tabs (40 mg) PO x 3 days, then take 2 tabs (20 mg) PO x 3 days, then take 1 tab (10 mg) PO x 3 days, then take 1/2 tab (5 mg) PO x 4 days. 41 tablet 0  . predniSONE (DELTASONE) 5 MG tablet     . warfarin (COUMADIN) 5 MG tablet TAKE AS DIRECTED BY COUMADIN CLINIC 90 tablet 0   No current facility-administered medications for this visit.    PHYSICAL EXAMINATION:   BP (!) 145/92 (BP Location: Left Arm, Patient Position: Sitting, Cuff Size: Normal)   Pulse (!) 59   Temp 98.2 F (36.8 C) (Tympanic)   Wt 249 lb (112.9 kg)   SpO2 98%   BMI 35.73 kg/m   Filed Weights    11/25/19 1040  Weight: 249 lb (112.9 kg)   Physical Exam HENT:     Head: Normocephalic and atraumatic.     Mouth/Throat:     Pharynx: No oropharyngeal exudate.  Eyes:     Pupils: Pupils are equal, round, and reactive to light.  Cardiovascular:     Rate and Rhythm: Normal rate and regular rhythm.  Pulmonary:     Effort: Pulmonary effort is normal. No respiratory distress.     Breath sounds: Normal breath sounds. No wheezing.  Abdominal:     General: Bowel sounds are normal. There is no distension.     Palpations: Abdomen is soft. There is no mass.     Tenderness: There is no abdominal tenderness. There is no guarding or rebound.  Musculoskeletal:        General: No tenderness. Normal range of motion.     Cervical back: Normal range of motion and neck supple.  Skin:    General: Skin is warm.  Neurological:     Mental Status: He is alert and oriented to person, place, and time.  Psychiatric:        Mood and Affect: Affect normal.     LABORATORY DATA:  I have reviewed the data as listed    Component Value Date/Time   NA 140 11/25/2019 0958   NA 138 12/11/2014 1518   NA 137 08/27/2014 0403   K 3.6 11/25/2019 0958   K 4.1 08/27/2014 0403   CL 101 11/25/2019 0958   CL 108 08/27/2014 0403   CO2 29 11/25/2019 0958   CO2 26 08/27/2014 0403   GLUCOSE 95 11/25/2019 0958   GLUCOSE 94 08/27/2014 0403   BUN 19 11/25/2019 0958   BUN 24 12/11/2014 1518   BUN 16 08/27/2014 0403   CREATININE 1.02 11/25/2019 0958   CREATININE 0.85 08/27/2014 0403   CALCIUM 8.9 11/25/2019 0958   CALCIUM 8.6 (L) 08/27/2014 0403   PROT 6.9 11/25/2019 0958   ALBUMIN 3.7 11/25/2019 0958   AST 16 11/25/2019 0958   ALT 17 11/25/2019 0958   ALKPHOS 45 11/25/2019 0958   BILITOT 0.4 11/25/2019 0958   GFRNONAA >60 11/25/2019 0958   GFRNONAA >60 08/27/2014 0403   GFRAA >60 11/25/2019 0958   GFRAA >60 08/27/2014 0403    No results found for: SPEP, UPEP  Lab Results  Component Value Date   WBC  12.2 (H) 11/25/2019   NEUTROABS 9.1 (H) 11/25/2019  HGB 12.7 (L) 11/25/2019   HCT 42.1 11/25/2019   MCV 86.3 11/25/2019   PLT 187 11/25/2019      Chemistry      Component Value Date/Time   NA 140 11/25/2019 0958   NA 138 12/11/2014 1518   NA 137 08/27/2014 0403   K 3.6 11/25/2019 0958   K 4.1 08/27/2014 0403   CL 101 11/25/2019 0958   CL 108 08/27/2014 0403   CO2 29 11/25/2019 0958   CO2 26 08/27/2014 0403   BUN 19 11/25/2019 0958   BUN 24 12/11/2014 1518   BUN 16 08/27/2014 0403   CREATININE 1.02 11/25/2019 0958   CREATININE 0.85 08/27/2014 0403      Component Value Date/Time   CALCIUM 8.9 11/25/2019 0958   CALCIUM 8.6 (L) 08/27/2014 0403   ALKPHOS 45 11/25/2019 0958   AST 16 11/25/2019 0958   ALT 17 11/25/2019 0958   BILITOT 0.4 11/25/2019 0958       RADIOGRAPHIC STUDIES: I have personally reviewed the radiological images as listed and agreed with the findings in the report. No results found.   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  # Hemoglobin today 12.7. No evidence of hemolysis. STABLE. Add LDH  #History of A. fib on Coumadin-STABLE.   # weight gain- sec to steroids- counseled; working with [employer].  # DISPOSITION:  #  H&H/LDH in 3 months [[place]];   # follow up-Second week fo jan 2021-MD/[place]-CBC/LDH in Dr.B    Orders Placed This Encounter  Procedures  . Lactate dehydrogenase    Standing Status:   Future    Number of Occurrences:   1    Standing Expiration Date:   11/24/2020   All questions were answered. The patient knows to call the clinic with any problems, questions or concerns.      Cammie Sickle, MD 11/25/2019 11:28 AM

## 2019-11-25 NOTE — Assessment & Plan Note (Addendum)
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  # Hemoglobin today 12.7. No evidence of hemolysis. STABLE. Add LDH  #History of A. fib on Coumadin-STABLE.   # weight gain- sec to steroids- counseled; working with [employer].  # DISPOSITION:  #  H&H/LDH in 3 months [mebane];   # follow up-Second week fo jan 2021-MD/[place]-CBC/LDH in Dr.B

## 2019-11-27 ENCOUNTER — Other Ambulatory Visit: Payer: Self-pay

## 2019-11-27 ENCOUNTER — Ambulatory Visit
Admission: RE | Admit: 2019-11-27 | Discharge: 2019-11-27 | Disposition: A | Discharge status: Home / Self Care | Payer: Medicare HMO | Attending: Family Medicine | Admitting: Family Medicine

## 2019-11-27 ENCOUNTER — Ambulatory Visit
Admission: RE | Admit: 2019-11-27 | Discharge: 2019-11-27 | Disposition: A | Discharge status: Home / Self Care | Payer: Medicare HMO | Source: Ambulatory Visit | Attending: Family Medicine | Admitting: Family Medicine

## 2019-11-27 ENCOUNTER — Other Ambulatory Visit: Payer: Self-pay | Admitting: Family Medicine

## 2019-11-27 DIAGNOSIS — R0989 Other specified symptoms and signs involving the circulatory and respiratory systems: Secondary | ICD-10-CM

## 2019-11-28 ENCOUNTER — Other Ambulatory Visit: Payer: Self-pay | Admitting: Specialist

## 2019-11-28 DIAGNOSIS — J8410 Pulmonary fibrosis, unspecified: Secondary | ICD-10-CM

## 2019-11-28 DIAGNOSIS — J849 Interstitial pulmonary disease, unspecified: Secondary | ICD-10-CM

## 2019-12-03 ENCOUNTER — Other Ambulatory Visit: Payer: Self-pay

## 2019-12-03 ENCOUNTER — Ambulatory Visit (INDEPENDENT_AMBULATORY_CARE_PROVIDER_SITE_OTHER): Payer: Medicare HMO

## 2019-12-03 DIAGNOSIS — I483 Typical atrial flutter: Secondary | ICD-10-CM

## 2019-12-03 DIAGNOSIS — Z5181 Encounter for therapeutic drug level monitoring: Secondary | ICD-10-CM | POA: Diagnosis not present

## 2019-12-03 LAB — POCT INR: INR: 2.6 (ref 2.0–3.0)

## 2019-12-03 NOTE — Patient Instructions (Signed)
Please continue dosage of 1 tablet daily except 1/2 tablet on Mondays  & Fridays.   Please be consistent with your green leafy vegetables and green tea.  Recheck in 6 weeks.

## 2019-12-05 ENCOUNTER — Ambulatory Visit: Payer: Medicare HMO

## 2019-12-10 ENCOUNTER — Other Ambulatory Visit: Payer: Self-pay | Admitting: Cardiovascular Disease

## 2019-12-15 ENCOUNTER — Other Ambulatory Visit: Payer: Self-pay

## 2019-12-15 ENCOUNTER — Ambulatory Visit
Admission: RE | Admit: 2019-12-15 | Discharge: 2019-12-15 | Disposition: A | Discharge status: Home / Self Care | Payer: Medicare HMO | Source: Ambulatory Visit | Attending: Specialist | Admitting: Specialist

## 2019-12-15 DIAGNOSIS — J8410 Pulmonary fibrosis, unspecified: Secondary | ICD-10-CM | POA: Insufficient documentation

## 2019-12-15 DIAGNOSIS — J849 Interstitial pulmonary disease, unspecified: Secondary | ICD-10-CM | POA: Diagnosis not present

## 2019-12-25 ENCOUNTER — Other Ambulatory Visit: Payer: Self-pay | Admitting: Cardiovascular Disease

## 2019-12-25 NOTE — Telephone Encounter (Signed)
Refill request

## 2020-01-19 ENCOUNTER — Ambulatory Visit (INDEPENDENT_AMBULATORY_CARE_PROVIDER_SITE_OTHER): Payer: Medicare HMO

## 2020-01-19 ENCOUNTER — Other Ambulatory Visit: Payer: Self-pay

## 2020-01-19 DIAGNOSIS — Z5181 Encounter for therapeutic drug level monitoring: Secondary | ICD-10-CM

## 2020-01-19 DIAGNOSIS — I483 Typical atrial flutter: Secondary | ICD-10-CM

## 2020-01-19 LAB — POCT INR: INR: 4.7 — AB (ref 2.0–3.0)

## 2020-01-19 NOTE — Patient Instructions (Addendum)
-   skip warfarin today and tomorrow,  - until your GI issues resolve, please START NEW DOSAGE of 1/2 tablet every day - recheck INR in 2 weeks - CALL ME IF YOUR APPETITE COMES BACK SO WE CAN RECHECK YOU SOONER AND GET YOU BACK ON YOUR REGULAR DOSAGE

## 2020-02-04 ENCOUNTER — Ambulatory Visit (INDEPENDENT_AMBULATORY_CARE_PROVIDER_SITE_OTHER): Payer: Medicare HMO

## 2020-02-04 ENCOUNTER — Other Ambulatory Visit: Payer: Self-pay

## 2020-02-04 DIAGNOSIS — I483 Typical atrial flutter: Secondary | ICD-10-CM | POA: Diagnosis not present

## 2020-02-04 DIAGNOSIS — Z5181 Encounter for therapeutic drug level monitoring: Secondary | ICD-10-CM

## 2020-02-04 LAB — POCT INR: INR: 1.4 — AB (ref 2.0–3.0)

## 2020-02-04 NOTE — Patient Instructions (Signed)
-   since your tummy is feeling better, RESUME previous warfarin dosage of 1 tablet warfarin every day EXCEPT 1/2 tablet on [place].  - Recheck 2 weeks

## 2020-02-18 ENCOUNTER — Ambulatory Visit (INDEPENDENT_AMBULATORY_CARE_PROVIDER_SITE_OTHER): Payer: Medicare HMO

## 2020-02-18 ENCOUNTER — Telehealth: Payer: Self-pay | Admitting: Cardiovascular Disease

## 2020-02-18 ENCOUNTER — Other Ambulatory Visit: Payer: Self-pay

## 2020-02-18 DIAGNOSIS — Z5181 Encounter for therapeutic drug level monitoring: Secondary | ICD-10-CM | POA: Diagnosis not present

## 2020-02-18 DIAGNOSIS — I483 Typical atrial flutter: Secondary | ICD-10-CM | POA: Diagnosis not present

## 2020-02-18 LAB — POCT INR: INR: 3.1 — AB (ref 2.0–3.0)

## 2020-02-18 NOTE — Patient Instructions (Signed)
-   START NEW warfarin dosage of 1 tablet warfarin every day EXCEPT 1/2 tablet on MONDAYS, [place].  - Recheck 2 weeks

## 2020-02-18 NOTE — Telephone Encounter (Signed)
   [facility] Pre-operative Risk Assessment    HEARTCARE STAFF: - Please ensure there is not already an duplicate clearance open for this procedure. - Under Visit Info/Reason for Call, type in Other and utilize the format Clearance MM/DD/YY or Clearance TBD. Do not use dashes or single digits. - If request is for dental extraction, please clarify the # of teeth to be extracted.  Request for surgical clearance:  1. What type of surgery is being performed? EGD and colonscopy  2. When is this surgery scheduled? tbd  3. What type of clearance is required (medical clearance vs. Pharmacy clearance to hold med vs. Both)? pharm  4. Are there any medications that need to be held prior to surgery and how long? Warfarin 5 days  5. Practice name and name of physician performing surgery? [facility]   6. What is the office phone number? [phone]   7.   What is the office fax number? [phone]  8.   Anesthesia type (None, local, MAC, general) ? Not noted   Marykay Lex 02/18/2020, 2:20 PM  _________________________________________________________________   (provider comments below)

## 2020-02-19 ENCOUNTER — Telehealth: Payer: Self-pay | Admitting: Cardiovascular Disease

## 2020-02-19 NOTE — Telephone Encounter (Signed)
Patient with diagnosis of aflutter on warfarin for anticoagulation.   Procedure: EGD and colonscopy Date of procedure: TBD  CHADS2-VASc score of  3 (CHF, HTN, CAD)  Per office protocol, patient can hold warfarin for 5 days prior to procedure.    Patient will NOT need bridging with Lovenox (enoxaparin) around procedure.

## 2020-02-19 NOTE — Telephone Encounter (Signed)
Patient contacted as part of preoperative cardiac evaluation protocol.  He has no cardiac complaints at this time.  We reviewed his Coumadin recommendation for holding his Coumadin prior to his procedure.  He expressed understanding and had no further questions at time of the call.

## 2020-02-19 NOTE — Telephone Encounter (Signed)
   Primary Cardiologist: Ida Rogue, MD  Chart reviewed as part of pre-operative protocol coverage. Given past medical history and time since last visit, based on ACC/AHA guidelines, [name] would be at acceptable risk for the planned procedure without further cardiovascular testing.   Patient with diagnosis of aflutter on warfarin for anticoagulation.   Procedure: EGD and colonscopy Date of procedure: TBD  CHADS2-VASc score of  3 (CHF, HTN, CAD)  Per office protocol, patient can hold warfarin for 5 days prior to procedure.    Patient will NOT need bridging with Lovenox (enoxaparin) around procedure.  Patient was advised that if he develops new symptoms prior to surgery to contact our office to arrange a follow-up appointment.  He verbalized understanding.  I will route this recommendation to the requesting party via Epic fax function and remove from pre-op pool.  Please call with questions.  Devin Ng. Loral Campi NP-C    02/19/2020, 10:56 AM [address] Office [phone] Fax [phone]

## 2020-02-19 NOTE — Telephone Encounter (Signed)
New message:      Patient returning called back. Please call patient back.

## 2020-02-24 ENCOUNTER — Inpatient Hospital Stay: Payer: Medicare HMO | Attending: Hematology and Oncology

## 2020-02-24 DIAGNOSIS — D594 Other nonautoimmune hemolytic anemias: Secondary | ICD-10-CM | POA: Insufficient documentation

## 2020-02-25 ENCOUNTER — Inpatient Hospital Stay: Payer: Medicare HMO

## 2020-02-25 ENCOUNTER — Other Ambulatory Visit: Payer: Self-pay

## 2020-02-25 DIAGNOSIS — D594 Other nonautoimmune hemolytic anemias: Secondary | ICD-10-CM | POA: Diagnosis not present

## 2020-02-25 LAB — HEMATOCRIT: HCT: 42.1 % (ref 39.0–52.0)

## 2020-02-25 LAB — HEMOGLOBIN: Hemoglobin: 12.6 g/dL — ABNORMAL LOW (ref 13.0–17.0)

## 2020-02-25 LAB — LACTATE DEHYDROGENASE: LDH: 191 U/L (ref 98–192)

## 2020-03-01 ENCOUNTER — Ambulatory Visit (INDEPENDENT_AMBULATORY_CARE_PROVIDER_SITE_OTHER): Payer: Medicare HMO

## 2020-03-01 ENCOUNTER — Other Ambulatory Visit: Payer: Self-pay

## 2020-03-01 DIAGNOSIS — Z5181 Encounter for therapeutic drug level monitoring: Secondary | ICD-10-CM | POA: Diagnosis not present

## 2020-03-01 DIAGNOSIS — I483 Typical atrial flutter: Secondary | ICD-10-CM

## 2020-03-01 LAB — POCT INR: INR: 2 (ref 2.0–3.0)

## 2020-03-01 NOTE — Patient Instructions (Signed)
-   starting Saturday, HOLD WARFARIN FOR 5 DAYS before your GI procedure - if ok'd by MD, please [place] 1/2 PILL FOR 2 DAYS, then - on Saturday, 10/16, resumewarfarin dosage of 1 tablet warfarin every day EXCEPT 1/2 tablet on MONDAYS, [place].  - Recheck 3 weeks

## 2020-03-22 ENCOUNTER — Other Ambulatory Visit: Payer: Self-pay

## 2020-03-22 ENCOUNTER — Ambulatory Visit (INDEPENDENT_AMBULATORY_CARE_PROVIDER_SITE_OTHER): Payer: Medicare HMO

## 2020-03-22 DIAGNOSIS — I483 Typical atrial flutter: Secondary | ICD-10-CM

## 2020-03-22 DIAGNOSIS — Z5181 Encounter for therapeutic drug level monitoring: Secondary | ICD-10-CM

## 2020-03-22 LAB — POCT INR: INR: 1.9 — AB (ref 2.0–3.0)

## 2020-03-22 NOTE — Patient Instructions (Signed)
-   take 1 whole tablet warfarin today (5 mg), then - resume warfarin dosage of 1 tablet warfarin every day EXCEPT 1/2 tablet on MONDAYS, WEDNESDAYS & FRIDAYS.  - Recheck 4 weeks

## 2020-04-21 ENCOUNTER — Ambulatory Visit (INDEPENDENT_AMBULATORY_CARE_PROVIDER_SITE_OTHER): Payer: Medicare HMO

## 2020-04-21 ENCOUNTER — Other Ambulatory Visit: Payer: Self-pay

## 2020-04-21 DIAGNOSIS — Z5181 Encounter for therapeutic drug level monitoring: Secondary | ICD-10-CM | POA: Diagnosis not present

## 2020-04-21 DIAGNOSIS — I4892 Unspecified atrial flutter: Secondary | ICD-10-CM | POA: Diagnosis not present

## 2020-04-21 DIAGNOSIS — I483 Typical atrial flutter: Secondary | ICD-10-CM

## 2020-04-21 LAB — POCT INR: INR: 1.9 — AB (ref 2.0–3.0)

## 2020-04-21 NOTE — Patient Instructions (Signed)
-   START NEW DOSAGE of 1 tablet warfarin every day EXCEPT 1/2 tablet on [place].  - Recheck 4 weeks

## 2020-05-19 ENCOUNTER — Ambulatory Visit (INDEPENDENT_AMBULATORY_CARE_PROVIDER_SITE_OTHER): Payer: Medicare HMO

## 2020-05-19 ENCOUNTER — Other Ambulatory Visit: Payer: Self-pay

## 2020-05-19 DIAGNOSIS — I483 Typical atrial flutter: Secondary | ICD-10-CM | POA: Diagnosis not present

## 2020-05-19 DIAGNOSIS — Z5181 Encounter for therapeutic drug level monitoring: Secondary | ICD-10-CM | POA: Diagnosis not present

## 2020-05-19 LAB — POCT INR: INR: 2.9 (ref 2.0–3.0)

## 2020-05-19 NOTE — Patient Instructions (Signed)
-   continue dosage of 1 tablet warfarin every day EXCEPT 1/2 tablet on [place].  - Recheck 5 weeks

## 2020-05-25 ENCOUNTER — Other Ambulatory Visit
Admission: RE | Admit: 2020-05-25 | Discharge: 2020-05-25 | Disposition: A | Discharge status: Home / Self Care | Payer: Medicare HMO | Attending: Urology | Admitting: Urology

## 2020-05-25 ENCOUNTER — Other Ambulatory Visit: Payer: Self-pay

## 2020-05-25 ENCOUNTER — Telehealth: Payer: Self-pay

## 2020-05-25 ENCOUNTER — Encounter: Payer: Self-pay | Admitting: Urology

## 2020-05-25 ENCOUNTER — Ambulatory Visit (INDEPENDENT_AMBULATORY_CARE_PROVIDER_SITE_OTHER): Payer: Medicare HMO | Admitting: Urology

## 2020-05-25 VITALS — BP 162/91 | HR 68 | Ht 70.0 in | Wt 249.2 lb

## 2020-05-25 DIAGNOSIS — R351 Nocturia: Secondary | ICD-10-CM

## 2020-05-25 DIAGNOSIS — N528 Other male erectile dysfunction: Secondary | ICD-10-CM

## 2020-05-25 DIAGNOSIS — N138 Other obstructive and reflux uropathy: Secondary | ICD-10-CM | POA: Diagnosis not present

## 2020-05-25 DIAGNOSIS — N401 Enlarged prostate with lower urinary tract symptoms: Secondary | ICD-10-CM | POA: Diagnosis not present

## 2020-05-25 LAB — URINALYSIS, COMPLETE (UACMP) WITH MICROSCOPIC
Bilirubin Urine: NEGATIVE
Glucose, UA: NEGATIVE mg/dL
Ketones, ur: NEGATIVE mg/dL
Leukocytes,Ua: NEGATIVE
Nitrite: NEGATIVE
Protein, ur: NEGATIVE mg/dL
Specific Gravity, Urine: 1.025 (ref 1.005–1.030)
pH: 5 (ref 5.0–8.0)

## 2020-05-25 MED ORDER — TADALAFIL 5 MG PO TABS: 5.0000 mg | ORAL_TABLET | Freq: Every day | ORAL | 1 refills | Status: DC

## 2020-05-25 NOTE — Telephone Encounter (Signed)
-----   Message from Sondra Come, MD sent at 05/25/2020  1:44 PM EST ----- Small amount of microscopic hematuria on urine sample today, please order repeat UA when he sees PA in 4 weeks  Legrand Rams, MD 05/25/2020 '

## 2020-05-25 NOTE — Progress Notes (Addendum)
05/25/20 8:55 AM   [name] 1960-09-20 [id number]  CC: ED, nocturia  HPI: I saw [name] in urology clinic for evaluation of erectile dysfunction and nocturia.  He is a 59 year old male with obesity and BMI of 36, atrial fibrillation on Coumadin, and hypertension on multiple medications.  He reports multiple years of erectile dysfunction with inability to get and keep an erection.  He previously tried Viagra which helped with the erections, but this was not affordable and he discontinued it.  Regarding his urinary symptoms, his primary complaint is mild urgency during the day, and nocturia 2 times overnight over the last few months.  He drinks coffee in the morning, and just water during the day.  He denies any lower extremity edema in the evenings.  He denies any dysuria or gross hematuria.  PVR is normal at 0 mL today.  UA is pending.  PSA normal at 2.26 in January 2021.  PMH: Past Medical History:  Diagnosis Date  . Anemia 10/2015  . Gangrene of finger (HCC)   . GERD (gastroesophageal reflux disease)   . Gout   . Hemolytic anemia (HCC)   . History of nuclear stress test    a. 09/02/2014: no sig ischemia, GI uptake noted, no sig WMA, EF 48%, no EKG chanes concerning for ischemia, low risk scan    . HTN (hypertension)   . Normal coronary arteries    a. cardiac cath 12/03/2014: no sigificant CAD, right dominant system, LVEF >55%, no MR or AS  . Paroxysmal atrial flutter (HCC)    a. s/p successful TEE/DCCV on 08/28/2014-->was on xarelto, now on warfarin.  . Pulmonary fibrosis (HCC)    a. not on home oxygen  . RA (rheumatoid arthritis) (HCC)   . Tachycardia-induced cardiomyopathy (HCC)    a. echo 07/2014: EF 20-25%, cannot exclude atrial flutter,  global HK, possible bicuspid Ao valve, mod reduced RV sys fxn, mild-mod aortic valve sclerosis/calcification, mod TR, mildly elevated RVSP; b. TEE 08/28/2014: EF 30-35%, no intracardic thrombus, mildly dilated LA/RA, mild TR,  mild-mod aortic sclerosis w/o stenosis; c. echo 09/2014: EF 55-60%, select images w/ inf HK; d. 10/2015 Echo: Ef 60-65%    Surgical History: Past Surgical History:  Procedure Laterality Date  . CARDIAC CATHETERIZATION N/A 12/03/2014   Procedure: Left Heart Cath and Coronary Angiography;  Surgeon: Antonieta Iba, MD;  Location: [facility] INVASIVE CV LAB;  Service: Cardiovascular;  Laterality: N/A;  . COLONOSCOPY WITH PROPOFOL N/A 11/23/2015   Procedure: COLONOSCOPY WITH PROPOFOL;  Surgeon: Midge Minium, MD;  Location: [facility] ENDOSCOPY;  Service: Endoscopy;  Laterality: N/A;  . COLONOSCOPY WITH PROPOFOL N/A 05/04/2017   Procedure: COLONOSCOPY WITH PROPOFOL;  Surgeon: Midge Minium, MD;  Location: [facility];  Service: Endoscopy;  Laterality: N/A;  . ESOPHAGOGASTRODUODENOSCOPY (EGD) WITH PROPOFOL N/A 11/23/2015   Procedure: ESOPHAGOGASTRODUODENOSCOPY (EGD) WITH PROPOFOL;  Surgeon: Midge Minium, MD;  Location: [facility] ENDOSCOPY;  Service: Endoscopy;  Laterality: N/A;  . HAND SURGERY Left   . PERIPHERAL VASCULAR CATHETERIZATION N/A 11/17/2015   Procedure: Upper Extremity Angiography;  Surgeon: Iran Ouch, MD;  Location: [facility]VAS[facility] CV [facility];  Service: Cardiovascular;  Laterality: N/A;  . POLYPECTOMY  05/04/2017   Procedure: POLYPECTOMY;  Surgeon: Midge Minium, MD;  Location: [facility];  Service: Endoscopy;;    Family History: Family History  Problem Relation Age of Onset  . Leukemia Mother     Social History:  reports that he quit smoking about 15 years ago. His smoking use  included cigarettes. He has a 20.00 pack-year smoking history. He has never used smokeless tobacco. He reports that he does not drink alcohol and does not use drugs.  Physical Exam: BP (!) 162/91 (BP Location: Left Arm, Patient Position: Sitting, Cuff Size: Large)   Pulse 68   Ht 5\' 10"  (1.778 m)   Wt 249 lb 3.2 oz (113 kg)   BMI 35.76 kg/m    Constitutional:  Alert and oriented, No acute  distress. Cardiovascular: No clubbing, cyanosis, or edema. Respiratory: Normal respiratory effort, no increased work of breathing. GI: Abdomen is soft, nontender, nondistended, no abdominal masses GU: Phallus with patent meatus, no lesions, testicles 20 cc and descended bilaterally without masses  Laboratory Data: Reviewed, UA pending  Pertinent Imaging: None to review  Assessment & Plan:   He is a 59 year old male with erectile dysfunction and nocturia.  We first reviewed behavioral strategies including weight loss, exercise, and avoiding bladder irritants.  Regarding his nocturia, I recommended minimizing fluids before bed, double voiding prior to bedtime.  We will call with urinalysis results.  In terms of his erectile dysfunction, we discussed the multitude of possible etiologies including his hypertension medications, and I recommended checking a testosterone today, and a trial of PDE 5 inhibitors.  With his mild urinary symptoms, I think a trial of Cialis 5 mg daily is very reasonable.  We discussed that we may need to titrate this dosing.  Risks and benefits were discussed at length.  Trial of Cialis 5 mg daily for ED and mild BPH symptoms, may need to increase to 10 mg Testosterone today, call with results RTC 4 weeks with PA for symptom check  ADDENDUM: Low-grade microscopic hematuria on UA with 6-10 RBCs.  We discussed options including cystoscopy and further imaging including renal ultrasound or CT.  He would like to repeat urinalysis at 1 month follow-up and consider further evaluation if persistent microscopic hematuria at that time  Devin Madrid, MD 05/25/2020  [name] [address] [place], [place] [address] [phone]

## 2020-05-25 NOTE — Patient Instructions (Addendum)
Erectile Dysfunction Erectile dysfunction (ED) is the inability to get or keep an erection in order to have sexual intercourse. Erectile dysfunction may include:  Inability to get an erection.  Lack of enough hardness of the erection to allow penetration.  Loss of the erection before sex is finished. What are the causes? This condition may be caused by:  Certain medicines, such as: ? Pain relievers. ? Antihistamines. ? Antidepressants. ? Blood pressure medicines. ? Water pills (diuretics). ? Ulcer medicines. ? Muscle relaxants. ? Drugs.  Excessive drinking.  Psychological causes, such as: ? Anxiety. ? Depression. ? Sadness. ? Exhaustion. ? Performance fear. ? Stress.  Physical causes, such as: ? Artery problems. This may include diabetes, smoking, liver disease, or atherosclerosis. ? High blood pressure. ? Hormonal problems, such as low testosterone. ? Obesity. ? Nerve problems. This may include back or pelvic injuries, diabetes mellitus, multiple sclerosis, or Parkinson disease. What are the signs or symptoms? Symptoms of this condition include:  Inability to get an erection.  Lack of enough hardness of the erection to allow penetration.  Loss of the erection before sex is finished.  Normal erections at some times, but with frequent unsatisfactory episodes.  Low sexual satisfaction in either partner due to erection problems.  A curved penis occurring with erection. The curve may cause pain or the penis may be too curved to allow for intercourse.  Never having nighttime erections. How is this diagnosed? This condition is often diagnosed by:  Performing a physical exam to find other diseases or specific problems with the penis.  Asking you detailed questions about the problem.  Performing blood tests to check for diabetes mellitus or to measure hormone levels.  Performing other tests to check for underlying health conditions.  Performing an ultrasound  exam to check for scarring.  Performing a test to check blood flow to the penis.  Doing a sleep study at home to measure nighttime erections. How is this treated? This condition may be treated by:  Medicine taken by mouth to help you achieve an erection (oral medicine).  Hormone replacement therapy to replace low testosterone levels.  Medicine that is injected into the penis. Your health care provider may instruct you how to give yourself these injections at home.  Vacuum pump. This is a pump with a ring on it. The pump and ring are placed on the penis and used to create pressure that helps the penis become erect.  Penile implant surgery. In this procedure, you may receive: ? An inflatable implant. This consists of cylinders, a pump, and a reservoir. The cylinders can be inflated with a fluid that helps to create an erection, and they can be deflated after intercourse. ? A semi-rigid implant. This consists of two silicone rubber rods. The rods provide some rigidity. They are also flexible, so the penis can both curve downward in its normal position and become straight for sexual intercourse.  Blood vessel surgery, to improve blood flow to the penis. During this procedure, a blood vessel from a different part of the body is placed into the penis to allow blood to flow around (bypass) damaged or blocked blood vessels.  Lifestyle changes, such as exercising more, losing weight, and quitting smoking. Follow these instructions at home: Medicines   Take over-the-counter and prescription medicines only as told by your health care provider. Do not increase the dosage without first discussing it with your health care provider.  If you are using self-injections, perform injections as directed by your   health care provider. Make sure to avoid any veins that are on the surface of the penis. After giving an injection, apply pressure to the injection site for 5 minutes. General  instructions  Exercise regularly, as directed by your health care provider. Work with your health care provider to lose weight, if needed.  Do not use any products that contain nicotine or tobacco, such as cigarettes and e-cigarettes. If you need help quitting, ask your health care provider.  Before using a vacuum pump, read the instructions that come with the pump and discuss any questions with your health care provider.  Keep all follow-up visits as told by your health care provider. This is important. Contact a health care provider if:  You feel nauseous.  You vomit. Get help right away if:  You are taking oral or injectable medicines and you have an erection that lasts longer than 4 hours. If your health care provider is unavailable, go to the nearest emergency room for evaluation. An erection that lasts much longer than 4 hours can result in permanent damage to your penis.  You have severe pain in your groin or abdomen.  You develop redness or severe swelling of your penis.  You have redness spreading up into your groin or lower abdomen.  You are unable to urinate.  You experience chest pain or a rapid heart beat (palpitations) after taking oral medicines. Summary  Erectile dysfunction (ED) is the inability to get or keep an erection during sexual intercourse. This problem can usually be treated successfully.  This condition is diagnosed based on a physical exam, your symptoms, and tests to determine the cause. Treatment varies depending on the cause, and may include medicines, hormone therapy, surgery, or vacuum pump.  You may need follow-up visits to make sure that you are using your medicines or devices correctly.  Get help right away if you are taking or injecting medicines and you have an erection that lasts longer than 4 hours. This information is not intended to replace advice given to you by your health care provider. Make sure you discuss any questions you have with  your health care provider. Document Revised: 04/27/2017 Document Reviewed: 05/31/2016 Elsevier Patient Education  2020 [employer]  

## 2020-05-25 NOTE — Telephone Encounter (Signed)
Called pt no answer. No DPR on file. LM for pt to call office back, will also send mychart.

## 2020-05-25 NOTE — Telephone Encounter (Signed)
Patient returned call, informed recommendations. Voiced understanding.

## 2020-05-26 ENCOUNTER — Telehealth: Payer: Self-pay

## 2020-05-26 DIAGNOSIS — R7989 Other specified abnormal findings of blood chemistry: Secondary | ICD-10-CM

## 2020-05-26 LAB — TESTOSTERONE: Testosterone: 213 ng/dL — ABNORMAL LOW (ref 264–916)

## 2020-05-26 NOTE — Telephone Encounter (Signed)
Called pt informed him of the information below. Pt gave verbal understanding. Lab orders placed. Pt advised labs must be AM draw.

## 2020-05-26 NOTE — Telephone Encounter (Signed)
-----   Message from Sondra Come, MD sent at 05/26/2020  8:15 AM EST ----- Testosterone was low, need to repeat T, LH, and hct prior to visit with Carollee Herter next month to discuss replacement options, thanks  Legrand Rams, MD 05/26/2020

## 2020-06-08 ENCOUNTER — Inpatient Hospital Stay: Payer: Medicare HMO | Admitting: Internal Medicine

## 2020-06-08 ENCOUNTER — Encounter: Payer: Self-pay | Admitting: Internal Medicine

## 2020-06-08 ENCOUNTER — Inpatient Hospital Stay: Payer: Medicare HMO | Attending: Internal Medicine

## 2020-06-08 DIAGNOSIS — D594 Other nonautoimmune hemolytic anemias: Secondary | ICD-10-CM

## 2020-06-08 DIAGNOSIS — Z7901 Long term (current) use of anticoagulants: Secondary | ICD-10-CM | POA: Insufficient documentation

## 2020-06-08 DIAGNOSIS — I4891 Unspecified atrial fibrillation: Secondary | ICD-10-CM | POA: Diagnosis not present

## 2020-06-08 DIAGNOSIS — Z79899 Other long term (current) drug therapy: Secondary | ICD-10-CM | POA: Insufficient documentation

## 2020-06-08 DIAGNOSIS — Z7952 Long term (current) use of systemic steroids: Secondary | ICD-10-CM | POA: Insufficient documentation

## 2020-06-08 DIAGNOSIS — D591 Autoimmune hemolytic anemia, unspecified: Secondary | ICD-10-CM | POA: Diagnosis not present

## 2020-06-08 LAB — CBC WITH DIFFERENTIAL/PLATELET
Abs Immature Granulocytes: 0.39 10*3/uL — ABNORMAL HIGH (ref 0.00–0.07)
Basophils Absolute: 0.1 10*3/uL (ref 0.0–0.1)
Basophils Relative: 1 %
Eosinophils Absolute: 0.2 10*3/uL (ref 0.0–0.5)
Eosinophils Relative: 2 %
HCT: 41.5 % (ref 39.0–52.0)
Hemoglobin: 12.6 g/dL — ABNORMAL LOW (ref 13.0–17.0)
Immature Granulocytes: 4 %
Lymphocytes Relative: 17 %
Lymphs Abs: 1.9 10*3/uL (ref 0.7–4.0)
MCH: 25.9 pg — ABNORMAL LOW (ref 26.0–34.0)
MCHC: 30.4 g/dL (ref 30.0–36.0)
MCV: 85.2 fL (ref 80.0–100.0)
Monocytes Absolute: 1 10*3/uL (ref 0.1–1.0)
Monocytes Relative: 9 %
Neutro Abs: 7.5 10*3/uL (ref 1.7–7.7)
Neutrophils Relative %: 67 %
Platelets: 192 10*3/uL (ref 150–400)
RBC: 4.87 MIL/uL (ref 4.22–5.81)
RDW: 14.7 % (ref 11.5–15.5)
WBC: 11 10*3/uL — ABNORMAL HIGH (ref 4.0–10.5)
nRBC: 0 % (ref 0.0–0.2)

## 2020-06-08 LAB — LACTATE DEHYDROGENASE: LDH: 149 U/L (ref 98–192)

## 2020-06-08 NOTE — Progress Notes (Signed)
[place] OFFICE PROGRESS NOTE  Patient Care Team: Vallery Sa as PCP - General (Physician Assistant) Minna Merritts, MD as PCP - Cardiology (Cardiology) Minna Merritts, MD as Consulting Physician (Cardiology)   Oncology History   No history exists.   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  male pleasant patient above history of Autoimmune hemolytic anemia- currently on prednisone 10 mg/CellCept 500 twice a day [also for Autoimmune/connective tissue disorder]- is here for follow-up.   Appetite is good. No weight loss but denies any worsening fatigue. Chronic mild joint pains. No new shortness of breath or cough. He continues to be concerned about his weight gain.  Review of Systems  Constitutional: Negative for chills, diaphoresis, fever, malaise/fatigue and weight loss.  HENT: Negative for nosebleeds and sore throat.   Eyes: Negative for double vision.  Respiratory: Negative for cough, hemoptysis, sputum production, shortness of breath and wheezing.   Cardiovascular: Negative for chest pain, palpitations, orthopnea and leg swelling.  Gastrointestinal: Negative for abdominal pain, blood in stool, constipation, diarrhea, heartburn, melena, nausea and vomiting.  Genitourinary: Negative for dysuria, frequency and urgency.  Musculoskeletal: Positive for back pain and joint pain.  Skin: Negative.  Negative for itching and rash.  Neurological: Negative for dizziness, tingling, focal weakness, weakness and headaches.  Endo/Heme/Allergies: Does not bruise/bleed easily.   Psychiatric/Behavioral: Negative for depression. The patient is not nervous/anxious and does not have insomnia.      PAST MEDICAL HISTORY :  Past Medical History:  Diagnosis Date  . Anemia 10/2015  . Gangrene of finger ([place])   . GERD (gastroesophageal reflux disease)   . Gout   . Hemolytic anemia ([place])   . History of nuclear stress test    a. 09/02/2014: no sig ischemia, GI uptake noted, no sig WMA, EF 48%, no EKG chanes concerning for ischemia, low risk scan    . HTN (hypertension)   . Normal coronary arteries    a. cardiac cath 12/03/2014: no sigificant CAD, right dominant system, LVEF >55%, no MR or AS  . Paroxysmal atrial flutter (HCC)    a. s/p successful TEE/DCCV on 08/28/2014-->was on xarelto, now on warfarin.  . Pulmonary fibrosis ([place])    a. not on home oxygen  . RA (rheumatoid arthritis) ([place])   . 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 echo 07/2014: EF 20-25%, cannot exclude atrial flutter,  global HK, possible bicuspid Ao valve, mod reduced RV sys fxn, mild-mod aortic valve sclerosis/calcification, mod TR, mildly elevated RVSP; b. TEE 08/28/2014: EF 30-35%, no intracardic thrombus, mildly dilated LA/RA, mild TR, mild-mod aortic sclerosis w/o stenosis; c. echo 09/2014: EF 55-60%, select images w/ inf HK; d. 10/2015 Echo: Ef 60-65%    PAST SURGICAL HISTORY :   Past Surgical History:  Procedure Laterality Date  . CARDIAC CATHETERIZATION N/A 12/03/2014   Procedure: Left Heart Cath and Coronary Angiography;  Surgeon: Minna Merritts, MD;  Location: [facility];  Service: Cardiovascular;  Laterality: N/A;  . COLONOSCOPY WITH PROPOFOL N/A 11/23/2015   Procedure: COLONOSCOPY WITH PROPOFOL;  Surgeon: [name], MD;  Location: [facility];  Service: Endoscopy;  Laterality: N/A;  . COLONOSCOPY WITH PROPOFOL  N/A 05/04/2017   Procedure: COLONOSCOPY WITH PROPOFOL;  Surgeon: [name], MD;  Location: [place];  Service: Endoscopy;  Laterality: N/A;  .  ESOPHAGOGASTRODUODENOSCOPY (EGD) WITH PROPOFOL N/A 11/23/2015   Procedure: ESOPHAGOGASTRODUODENOSCOPY (EGD) WITH PROPOFOL;  Surgeon: [name], MD;  Location: [facility];  Service: Endoscopy;  Laterality: N/A;  . HAND SURGERY Left   . PERIPHERAL VASCULAR CATHETERIZATION N/A 11/17/2015   Procedure: Upper Extremity Angiography;  Surgeon: Wellington Hampshire, MD;  Location: [facility] [facility];  Service: Cardiovascular;  Laterality: N/A;  . POLYPECTOMY  05/04/2017   Procedure: POLYPECTOMY;  Surgeon: [name], MD;  Location: [place];  Service: Endoscopy;;    FAMILY HISTORY :   Family History  Problem Relation Age of Onset  . Leukemia Mother     SOCIAL HISTORY:   Social History   Tobacco Use  . Smoking status: Former Smoker    Packs/day: 1.00    Years: 20.00    Pack years: 20.00    Types: Cigarettes    Quit date: 08/30/2004    Years since quitting: 15.7  . Smokeless tobacco: Never Used  Vaping Use  . Vaping Use: Never used  Substance Use Topics  . Alcohol use: No  . Drug use: No    ALLERGIES:  is allergic to allopurinol and probenecid.  MEDICATIONS:  Current Outpatient Medications  Medication Sig Dispense Refill  . albuterol (PROVENTIL HFA;VENTOLIN HFA) 108 (90 Base) MCG/ACT inhaler Inhale 2 puffs into the lungs 4 (four) times daily as needed for wheezing or shortness of breath. 18 Inhaler 5  . allopurinol (ZYLOPRIM) 100 MG tablet Take 50 mg by mouth daily.     [name] diltiazem (CARDIZEM CD) 120 MG 24 hr capsule TAKE 1 CAPSULE EVERY DAY 90 capsule 3  . docusate sodium (COLACE) 100 MG capsule Take 1 tablet once or twice daily as needed for constipation while taking narcotic pain medicine 30 capsule 0  . enalapril (VASOTEC) 20 MG tablet TAKE 1 TABLET (20 MG TOTAL) BY MOUTH DAILY. 90 tablet 3  . fluticasone (FLONASE) 50 MCG/ACT nasal spray Place 2 sprays into both nostrils daily. 16 g 0  . Fluticasone-Salmeterol (ADVAIR DISKUS) 250-50 MCG/DOSE AEPB TAKE 1 PUFF BY MOUTH  TWICE A DAY *RINSE MOUTH AFTER USE* 14 each 0  . folic acid (FOLVITE) 1 MG tablet Take 1 tablet (1 mg total) by mouth daily. 90 tablet 1  . hydrochlorothiazide (HYDRODIURIL) 25 MG tablet TAKE 1 TABLET EVERY DAY 90 tablet 3  . HYDROcodone-acetaminophen (NORCO/VICODIN) 5-325 MG tablet Take 2 tablets by mouth every 6 (six) hours as needed for moderate pain or severe pain. 16 tablet 0  . hydroxychloroquine (PLAQUENIL) 200 MG tablet Take 400 mg by mouth 2 (two) times daily. Dr Delmer Islam    . meloxicam (MOBIC) 15 MG tablet Take 1 tablet (15 mg total) by mouth daily. 30 tablet 0  . metoprolol tartrate (LOPRESSOR) 50 MG tablet TAKE 1 AND 1/2 TABLETS (75 MG) BY MOUTH 2 (TWO) TIMES DAILY. 270 tablet 3  . Multiple Vitamin (MULTI-VITAMINS) TABS Take by mouth.    . mycophenolate (CELLCEPT) 500 MG tablet Take 1,000 mg by mouth 2 (two) times daily.   11  . pantoprazole (PROTONIX) 40 MG tablet Take 40 mg by mouth 2 (two) times daily.    . potassium chloride SA (KLOR-CON) 20 MEQ tablet TAKE 1 TABLET EVERY DAY 90 tablet 3  . predniSONE (DELTASONE) 5 MG tablet TAKE 2 TABLETS (10 MG TOTAL) BY MOUTH ONCE DAILY    .  tadalafil (CIALIS) 5 MG tablet Take 1 tablet (5 mg total) by mouth daily. May increase to 10mg  as needed 30 tablet 1  . warfarin (COUMADIN) 5 MG tablet Take by mouth.     No current facility-administered medications for this visit.    PHYSICAL EXAMINATION:   BP (!) 133/92 (BP Location: Left Arm, Patient Position: Sitting, Cuff Size: Large)   Pulse 72   Temp 97.9 F (36.6 C) (Tympanic)   Resp 16   Ht 5\' 10"  (1.778 m)   Wt 258 lb (117 kg)   SpO2 97%   BMI 37.02 kg/m   Filed Weights   06/08/20 1028  Weight: 258 lb (117 kg)   Physical Exam HENT:     Head: Normocephalic and atraumatic.     Mouth/Throat:     Pharynx: No oropharyngeal exudate.  Eyes:     Pupils: Pupils are equal, round, and reactive to light.  Cardiovascular:     Rate and Rhythm: Normal rate and regular rhythm.  Pulmonary:      Effort: Pulmonary effort is normal. No respiratory distress.     Breath sounds: Normal breath sounds. No wheezing.  Abdominal:     General: Bowel sounds are normal. There is no distension.     Palpations: Abdomen is soft. There is no mass.     Tenderness: There is no abdominal tenderness. There is no guarding or rebound.  Musculoskeletal:        General: No tenderness. Normal range of motion.     Cervical back: Normal range of motion and neck supple.  Skin:    General: Skin is warm.  Neurological:     Mental Status: He is alert and oriented to person, place, and time.  Psychiatric:        Mood and Affect: Affect normal.     LABORATORY DATA:  I have reviewed the data as listed    Component Value Date/Time   NA 140 11/25/2019 0958   NA 138 12/11/2014 1518   NA 137 08/27/2014 0403   K 3.6 11/25/2019 0958   K 4.1 08/27/2014 0403   CL 101 11/25/2019 0958   CL 108 08/27/2014 0403   CO2 29 11/25/2019 0958   CO2 26 08/27/2014 0403   GLUCOSE 95 11/25/2019 0958   GLUCOSE 94 08/27/2014 0403   BUN 19 11/25/2019 0958   BUN 24 12/11/2014 1518   BUN 16 08/27/2014 0403   CREATININE 1.02 11/25/2019 0958   CREATININE 0.85 08/27/2014 0403   CALCIUM 8.9 11/25/2019 0958   CALCIUM 8.6 (L) 08/27/2014 0403   PROT 6.9 11/25/2019 0958   ALBUMIN 3.7 11/25/2019 0958   AST 16 11/25/2019 0958   ALT 17 11/25/2019 0958   ALKPHOS 45 11/25/2019 0958   BILITOT 0.4 11/25/2019 0958   GFRNONAA >60 11/25/2019 0958   GFRNONAA >60 08/27/2014 0403   GFRAA >60 11/25/2019 0958   GFRAA >60 08/27/2014 0403    No results found for: SPEP, UPEP  Lab Results  Component Value Date   WBC 11.0 (H) 06/08/2020   NEUTROABS 7.5 06/08/2020   HGB 12.6 (L) 06/08/2020   HCT 41.5 06/08/2020   MCV 85.2 06/08/2020   PLT 192 06/08/2020      Chemistry      Component Value Date/Time   NA 140 11/25/2019 0958   NA 138 12/11/2014 1518   NA 137 08/27/2014 0403   K 3.6 11/25/2019 0958   K 4.1 08/27/2014 0403    CL 101 11/25/2019 3536  CL 108 08/27/2014 0403   CO2 29 11/25/2019 0958   CO2 26 08/27/2014 0403   BUN 19 11/25/2019 0958   BUN 24 12/11/2014 1518   BUN 16 08/27/2014 0403   CREATININE 1.02 11/25/2019 0958   CREATININE 0.85 08/27/2014 0403      Component Value Date/Time   CALCIUM 8.9 11/25/2019 0958   CALCIUM 8.6 (L) 08/27/2014 0403   ALKPHOS 45 11/25/2019 0958   AST 16 11/25/2019 0958   ALT 17 11/25/2019 0958   BILITOT 0.4 11/25/2019 0958       RADIOGRAPHIC STUDIES: I have personally reviewed the radiological images as listed and agreed with the findings in the report. No results found.   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  # Hemoglobin today 12.6. No evidence of hemolysis. STABLE.   # History of A. fib on Coumadin- STABLE.   # DISPOSITION:  #  H&H/LDH in 3 months [[place]];   # follow up in 6 months;-MD/[place]-CBC/LDH/BMP; Haptoglobin-Dr.B    Orders Placed This Encounter  Procedures  . Hematocrit ([facility])    Standing Status:   Future    Standing Expiration Date:   06/08/2021  . Hemoglobin [facility])    Standing Status:   Future    Standing Expiration Date:   06/08/2021  . Lactate dehydrogenase    Standing Status:   Future    Standing Expiration Date:   06/08/2021  . CBC with Differential    Standing Status:   Future    Standing Expiration Date:   06/08/2021  . Basic metabolic panel    Standing Status:   Future    Standing Expiration Date:   06/08/2021  . Lactate dehydrogenase    Standing Status:   Future    Standing Expiration Date:   06/08/2021  . Haptoglobin    Standing Status:   Future    Standing Expiration Date:   06/08/2021   All questions were answered. The patient knows to call the clinic with any problems, questions or concerns.      Cammie Sickle,  MD 06/09/2020 6:45 AM

## 2020-06-08 NOTE — Assessment & Plan Note (Signed)
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  # Hemoglobin today 12.6. No evidence of hemolysis. STABLE.   # History of A. fib on Coumadin- STABLE.   # DISPOSITION:  #  H&H/LDH in 3 months [[place]];   # follow up in 6 months;-MD/[place]-CBC/LDH/BMP; Haptoglobin-Dr.B

## 2020-06-23 ENCOUNTER — Other Ambulatory Visit: Payer: Self-pay

## 2020-06-23 ENCOUNTER — Ambulatory Visit: Payer: Medicare HMO

## 2020-06-23 DIAGNOSIS — I483 Typical atrial flutter: Secondary | ICD-10-CM | POA: Diagnosis not present

## 2020-06-23 DIAGNOSIS — Z5181 Encounter for therapeutic drug level monitoring: Secondary | ICD-10-CM

## 2020-06-23 LAB — POCT INR: INR: 2.6 (ref 2.0–3.0)

## 2020-06-23 NOTE — Patient Instructions (Signed)
-   continue dosage of 1 tablet warfarin every day EXCEPT 1/2 tablet on [place].  - Recheck 6 weeks

## 2020-06-27 NOTE — Progress Notes (Signed)
06/28/2020 2:23 PM   [name] [birth year] [id number]  Referring provider: Hughie Closs, PA-C [name],  [place] [address]  Chief Complaint  Patient presents with  . Follow-up    1 mth follow-up   Urological history 1. Microscopic hematuria - incidental finding of 6-10 RBC's in 04/2020 - today's UA negative for micro heme  2. ED - SHIM 6 - contributing factors of age, BPH, HTN, obesity, CHF, CVA, anticoagulants and former smoker - managed with tadalafil 5 mg daily   HPI: [name] is a 60 y.o. who presents today for a monthly follow up after a trial of tadalafil, one low testosterone and microscopic hematuria.  He took the tadalafil 5 mg daily for three days, but he then developed a HA.  He discontinue the medication.    Patient still having spontaneous erections.  He denies any pain or curvature with erections.    SHIM    Row Name 06/28/20 1032         SHIM: Over the last 6 months:   How do you rate your confidence that you could get and keep an erection? Very Low     When you had erections with sexual stimulation, how often were your erections hard enough for penetration (entering your partner)? Almost Never or Never     During sexual intercourse, how often were you able to maintain your erection after you had penetrated (entered) your partner? Almost Never or Never     During sexual intercourse, how difficult was it to maintain your erection to completion of intercourse? Extremely Difficult     When you attempted sexual intercourse, how often was it satisfactory for you? A Few Times (much less than half the time)           SHIM Total Score   SHIM 6            Score: 1-7 Severe ED 8-11 Moderate ED 12-16 Mild-Moderate ED 17-21 Mild ED 22-25 No ED  He complains of frequency and urgency of urination.  He is also having nocturia x 2-3.  He has a history of night shift work.    Patient denies any modifying or  aggravating factors.  Patient denies any gross hematuria, dysuria or suprapubic/flank pain.  Patient denies any fevers, chills, nausea or vomiting.   PVR is 0 mL.    He states he drinks a lot of water.    He has no family history of prostate cancer.    IPSS    Row Name 06/28/20 1000         International Prostate Symptom Score   How often have you had the sensation of not emptying your bladder? Less than half the time     How often have you had to urinate less than every two hours? Less than 1 in 5 times     How often have you found you stopped and started again several times when you urinated? Less than half the time     How often have you found it difficult to postpone urination? Not at All     How often have you had a weak urinary stream? Less than half the time     How often have you had to strain to start urination? Not at All     How many times did you typically get up at night to urinate? 2 Times     Total IPSS Score 9  Quality of Life due to urinary symptoms   If you were to spend the rest of your life with your urinary condition just the way it is now how would you feel about that? Mixed            Score:  1-7 Mild 8-19 Moderate 20-35 Severe  PMH: Past Medical History:  Diagnosis Date  . Anemia 10/2015  . Gangrene of finger ([place])   . GERD (gastroesophageal reflux disease)   . Gout   . Hemolytic anemia ([place])   . History of nuclear stress test    a. 09/02/2014: no sig ischemia, GI uptake noted, no sig WMA, EF 48%, no EKG chanes concerning for ischemia, low risk scan    . HTN (hypertension)   . Normal coronary arteries    a. cardiac cath 12/03/2014: no sigificant CAD, right dominant system, LVEF >55%, no MR or AS  . Paroxysmal atrial flutter (HCC)    a. s/p successful TEE/DCCV on 08/28/2014-->was on xarelto, now on warfarin.  . Pulmonary fibrosis ([name])    a. not on home oxygen  . RA (rheumatoid arthritis) ([name])   . Tachycardia-induced cardiomyopathy  ([name])    a. echo 07/2014: EF 20-25%, cannot exclude atrial flutter,  global HK, possible bicuspid Ao valve, mod reduced RV sys fxn, mild-mod aortic valve sclerosis/calcification, mod TR, mildly elevated RVSP; b. TEE 08/28/2014: EF 30-35%, no intracardic thrombus, mildly dilated LA/RA, mild TR, mild-mod aortic sclerosis w/o stenosis; c. echo 09/2014: EF 55-60%, select images w/ inf HK; d. 10/2015 Echo: Ef 60-65%    Surgical History: Past Surgical History:  Procedure Laterality Date  . CARDIAC CATHETERIZATION N/A 12/03/2014   Procedure: Left Heart Cath and Coronary Angiography;  Surgeon: Devin Merritts, MD;  Location: [facility];  Service: Cardiovascular;  Laterality: N/A;  . COLONOSCOPY WITH PROPOFOL N/A 11/23/2015   Procedure: COLONOSCOPY WITH PROPOFOL;  Surgeon: Devin Lame, MD;  Location: [facility];  Service: Endoscopy;  Laterality: N/A;  . COLONOSCOPY WITH PROPOFOL N/A 05/04/2017   Procedure: COLONOSCOPY WITH PROPOFOL;  Surgeon: Devin Lame, MD;  Location: [place];  Service: Endoscopy;  Laterality: N/A;  . ESOPHAGOGASTRODUODENOSCOPY (EGD) WITH PROPOFOL N/A 11/23/2015   Procedure: ESOPHAGOGASTRODUODENOSCOPY (EGD) WITH PROPOFOL;  Surgeon: Devin Lame, MD;  Location: [facility];  Service: Endoscopy;  Laterality: N/A;  . HAND SURGERY Left   . PERIPHERAL VASCULAR CATHETERIZATION N/A 11/17/2015   Procedure: Upper Extremity Angiography;  Surgeon: Devin Hampshire, MD;  Location: [facility];  Service: Cardiovascular;  Laterality: N/A;  . POLYPECTOMY  05/04/2017   Procedure: POLYPECTOMY;  Surgeon: Devin Lame, MD;  Location: [place];  Service: Endoscopy;;    Home Medications:  Allergies as of 06/28/2020      Reactions   Allopurinol Diarrhea   Probenecid Other (See Comments), Nausea Only   Other reaction(s): Headache      Medication List       Accurate as of June 28, 2020  2:23 PM. If you have any questions, ask your nurse or doctor.         STOP taking these medications   albuterol 108 (90 Base) MCG/ACT inhaler Commonly known as: VENTOLIN HFA Stopped by: Leanora Murin, PA-C   docusate sodium 100 MG capsule Commonly known as: Colace Stopped by: Jaiona Simien, PA-C   fluticasone 50 MCG/ACT nasal spray Commonly known as: FLONASE Stopped by: Donata Reddick, PA-C   Fluticasone-Salmeterol 250-50 MCG/DOSE Aepb Commonly known as: Advair Diskus Stopped by: Zara Council, PA-C  folic acid 1 MG tablet Commonly known as: FOLVITE Stopped by: Zara Council, PA-C   HYDROcodone-acetaminophen 5-325 MG tablet Commonly known as: NORCO/VICODIN Stopped by: Sherrise Liberto, PA-C   hydroxychloroquine 200 MG tablet Commonly known as: PLAQUENIL Stopped by: Jasmeen Fritsch, PA-C   mycophenolate 500 MG tablet Commonly known as: CELLCEPT Stopped by: Anh Mangano, PA-C     TAKE these medications   allopurinol 100 MG tablet Commonly known as: ZYLOPRIM Take 50 mg by mouth daily.   diltiazem 120 MG 24 hr capsule Commonly known as: CARDIZEM CD TAKE 1 CAPSULE EVERY DAY   enalapril 20 MG tablet Commonly known as: VASOTEC TAKE 1 TABLET (20 MG TOTAL) BY MOUTH DAILY.   hydrochlorothiazide 25 MG tablet Commonly known as: HYDRODIURIL TAKE 1 TABLET EVERY DAY   metoprolol tartrate 50 MG tablet Commonly known as: LOPRESSOR TAKE 1 AND 1/2 TABLETS (75 MG) BY MOUTH 2 (TWO) TIMES DAILY.   Multi-Vitamins Tabs Take by mouth.   pantoprazole 40 MG tablet Commonly known as: PROTONIX Take 40 mg by mouth 2 (two) times daily.   potassium chloride SA 20 MEQ tablet Commonly known as: KLOR-CON TAKE 1 TABLET EVERY DAY   predniSONE 5 MG tablet Commonly known as: DELTASONE TAKE 2 TABLETS (10 MG TOTAL) BY MOUTH ONCE DAILY   sildenafil 100 MG tablet Commonly known as: VIAGRA Take 1 tablet (100 mg total) by mouth daily as needed for erectile dysfunction. Started by: Zara Council, PA-C   tadalafil 5 MG tablet Commonly known  as: CIALIS Take 1 tablet (5 mg total) by mouth daily. May increase to 10mg  as needed   warfarin 5 MG tablet Commonly known as: COUMADIN Take as directed by the anticoagulation clinic. If you are unsure how to take this medication, talk to your nurse or doctor. Original instructions: Take by mouth.       Allergies:  Allergies  Allergen Reactions  . Allopurinol Diarrhea  . Probenecid Other (See Comments) and Nausea Only    Other reaction(s): Headache    Family History: Family History  Problem Relation Age of Onset  . Leukemia Mother     Social History:  reports that he quit smoking about 15 years ago. His smoking use included cigarettes. He has a 20.00 pack-year smoking history. He has never used smokeless tobacco. He reports that he does not drink alcohol and does not use drugs.  ROS: Pertinent ROS in HPI  Physical Exam: BP (!) 160/94   Pulse 73   Ht 5\' 10"  (1.778 m)   Wt 253 lb (114.8 kg)   BMI 36.30 kg/m   Constitutional:  Well nourished. Alert and oriented, No acute distress. HEENT: [place] AT, mask in place.  Trachea midline Cardiovascular: No clubbing, cyanosis, or edema. Respiratory: Normal respiratory effort, no increased work of breathing. GU: No CVA tenderness.  No bladder fullness or masses.  Patient with circumcised phallus.   Urethral meatus is patent.  No penile discharge. No penile lesions or rashes. Scrotum without lesions, cysts, rashes and/or edema.  Testicles are located scrotally bilaterally. No masses are appreciated in the testicles. Left and right epididymis are normal. Rectal: Patient with  normal sphincter tone. Anus and perineum without scarring or rashes. No rectal masses are appreciated. Prostate is approximately 50 + grams, could only palpate apex and midportion of the gland, 1 cm nodule in the right apex is appreciated. Seminal vesicles could not be palpated.   Lymph: No cervical or inguinal adenopathy. Neurologic: Grossly intact, no focal deficits,  moving  all 4 extremities. Psychiatric: Normal mood and affect.  Laboratory Data: Lab Results  Component Value Date   WBC 11.0 (H) 06/08/2020   HGB 12.6 (L) 06/08/2020   HCT 41.5 06/08/2020   MCV 85.2 06/08/2020   PLT 192 06/08/2020    Lab Results  Component Value Date   CREATININE 1.02 11/25/2019    Lab Results  Component Value Date   TESTOSTERONE 213 (L) 05/25/2020    Lab Results  Component Value Date   AST 16 11/25/2019   Lab Results  Component Value Date   ALT 17 11/25/2019    Urinalysis Component     Latest Ref Rng & Units 06/28/2020  Color, Urine     YELLOW YELLOW  Appearance     CLEAR CLEAR  Specific Gravity, Urine     1.005 - 1.030 1.025  pH     5.0 - 8.0 5.5  Glucose, UA     NEGATIVE mg/dL NEGATIVE  Hgb urine dipstick     NEGATIVE NEGATIVE  Bilirubin Urine     NEGATIVE NEGATIVE  Ketones, ur     NEGATIVE mg/dL NEGATIVE  Protein     NEGATIVE mg/dL NEGATIVE  Nitrite     NEGATIVE NEGATIVE  Leukocytes,Ua     NEGATIVE NEGATIVE  Squamous Epithelial / LPF     0 - 5 0-5  WBC, UA     0 - 5 WBC/hpf 0-5  RBC / HPF     0 - 5 RBC/hpf NONE SEEN  Bacteria, UA     NONE SEEN FEW (A)  Hyaline Casts, UA      PRESENT  I have reviewed the labs.   Pertinent Imaging: No recent imaging  Assessment & Plan:    1. Prostate nodule  - discussed the possibility of the nodule representing a cancer and have recommended a prostate MRI for further evaluation - PSA from today is pending  2. Erectile dysfunction - SHIM score is 6 - would like to try another PDE5i's - a prescription sent for sildenafil 100 mg, on-demand-dosing to [facility] - discussed the side effects of testosterone therapy and patient would like to defer treatment at this time - RTC in one month for repeat SHIM score and exam   Return for prostate MRI .  These notes generated with voice recognition software. I apologize for typographical errors.  Zara Council, PA-C  [facility] [address]  [place], [place] [address] [phone]

## 2020-06-28 ENCOUNTER — Encounter: Payer: Self-pay | Admitting: Urology

## 2020-06-28 ENCOUNTER — Other Ambulatory Visit
Admission: RE | Admit: 2020-06-28 | Discharge: 2020-06-28 | Disposition: A | Discharge status: Home / Self Care | Payer: Medicare HMO | Attending: Urology | Admitting: Urology

## 2020-06-28 ENCOUNTER — Ambulatory Visit: Payer: Medicare HMO | Admitting: Urology

## 2020-06-28 ENCOUNTER — Other Ambulatory Visit: Payer: Self-pay | Admitting: *Deleted

## 2020-06-28 ENCOUNTER — Other Ambulatory Visit: Payer: Self-pay

## 2020-06-28 VITALS — BP 160/94 | HR 73 | Ht 70.0 in | Wt 253.0 lb

## 2020-06-28 DIAGNOSIS — R7989 Other specified abnormal findings of blood chemistry: Secondary | ICD-10-CM

## 2020-06-28 DIAGNOSIS — N138 Other obstructive and reflux uropathy: Secondary | ICD-10-CM

## 2020-06-28 DIAGNOSIS — N402 Nodular prostate without lower urinary tract symptoms: Secondary | ICD-10-CM

## 2020-06-28 DIAGNOSIS — N401 Enlarged prostate with lower urinary tract symptoms: Secondary | ICD-10-CM | POA: Diagnosis present

## 2020-06-28 DIAGNOSIS — N529 Male erectile dysfunction, unspecified: Secondary | ICD-10-CM | POA: Diagnosis not present

## 2020-06-28 LAB — URINALYSIS, COMPLETE (UACMP) WITH MICROSCOPIC
Bilirubin Urine: NEGATIVE
Glucose, UA: NEGATIVE mg/dL
Hgb urine dipstick: NEGATIVE
Ketones, ur: NEGATIVE mg/dL
Leukocytes,Ua: NEGATIVE
Nitrite: NEGATIVE
Protein, ur: NEGATIVE mg/dL
RBC / HPF: NONE SEEN RBC/hpf (ref 0–5)
Specific Gravity, Urine: 1.025 (ref 1.005–1.030)
pH: 5.5 (ref 5.0–8.0)

## 2020-06-28 LAB — BLADDER SCAN AMB NON-IMAGING: Scan Result: 0

## 2020-06-28 LAB — PSA: Prostatic Specific Antigen: 2.73 ng/mL (ref 0.00–4.00)

## 2020-06-28 MED ORDER — SILDENAFIL CITRATE 100 MG PO TABS: 100.0000 mg | ORAL_TABLET | Freq: Every day | ORAL | 2 refills | Status: DC | PRN

## 2020-06-29 LAB — TESTOSTERONE: Testosterone: 255 ng/dL — ABNORMAL LOW (ref 264–916)

## 2020-06-29 LAB — FSH/LH
FSH: 5.9 m[IU]/mL (ref 1.5–12.4)
LH: 3.8 m[IU]/mL (ref 1.7–8.6)

## 2020-07-16 ENCOUNTER — Ambulatory Visit
Admission: RE | Admit: 2020-07-16 | Discharge: 2020-07-16 | Disposition: A | Discharge status: Home / Self Care | Payer: Medicare HMO | Source: Ambulatory Visit | Attending: Urology | Admitting: Urology

## 2020-07-16 ENCOUNTER — Other Ambulatory Visit: Payer: Self-pay

## 2020-07-16 DIAGNOSIS — N402 Nodular prostate without lower urinary tract symptoms: Secondary | ICD-10-CM | POA: Insufficient documentation

## 2020-07-16 MED ORDER — GADOBUTROL 1 MMOL/ML IV SOLN
10.0000 mL | Freq: Once | INTRAVENOUS | Status: AC | PRN
  Administered 2020-07-16: 10 mL via INTRAVENOUS

## 2020-08-04 ENCOUNTER — Ambulatory Visit (INDEPENDENT_AMBULATORY_CARE_PROVIDER_SITE_OTHER): Payer: Medicare HMO

## 2020-08-04 ENCOUNTER — Other Ambulatory Visit: Payer: Self-pay

## 2020-08-04 DIAGNOSIS — I483 Typical atrial flutter: Secondary | ICD-10-CM | POA: Diagnosis not present

## 2020-08-04 DIAGNOSIS — Z5181 Encounter for therapeutic drug level monitoring: Secondary | ICD-10-CM

## 2020-08-04 LAB — POCT INR: INR: 3 (ref 2.0–3.0)

## 2020-08-04 NOTE — Patient Instructions (Signed)
-   continue dosage of 1 tablet warfarin every day EXCEPT 1/2 tablet on [name].  - Try to have some greens in the next day or 2 - Recheck 6 weeks

## 2020-08-15 ENCOUNTER — Other Ambulatory Visit: Payer: Self-pay | Admitting: Cardiovascular Disease

## 2020-08-30 ENCOUNTER — Telehealth: Payer: Self-pay | Admitting: Urology

## 2020-08-30 ENCOUNTER — Encounter: Payer: Self-pay | Admitting: *Deleted

## 2020-08-30 NOTE — Telephone Encounter (Signed)
Would you call [name] and see if he has read his  MyChart message regarding his prostate MRI report and if he is still wanting to have his prostate biopsy or schedule a follow up appointment in 6 months?

## 2020-08-30 NOTE — Telephone Encounter (Signed)
Sent mychart message

## 2020-09-01 NOTE — Telephone Encounter (Signed)
Called patient and scheduled a follow up appointment in [place] with Larene Beach to discuss his recent prostate MRI and next steps.

## 2020-09-03 NOTE — Progress Notes (Signed)
Error

## 2020-09-06 ENCOUNTER — Other Ambulatory Visit: Payer: Self-pay

## 2020-09-06 ENCOUNTER — Inpatient Hospital Stay: Payer: Medicare HMO | Attending: Hematology and Oncology

## 2020-09-06 ENCOUNTER — Ambulatory Visit: Payer: Medicare HMO | Admitting: Urology

## 2020-09-06 ENCOUNTER — Encounter: Payer: Self-pay | Admitting: Urology

## 2020-09-06 VITALS — BP 149/86 | HR 80 | Ht 70.0 in | Wt 253.0 lb

## 2020-09-06 DIAGNOSIS — N529 Male erectile dysfunction, unspecified: Secondary | ICD-10-CM

## 2020-09-06 DIAGNOSIS — D594 Other nonautoimmune hemolytic anemias: Secondary | ICD-10-CM

## 2020-09-06 DIAGNOSIS — I4891 Unspecified atrial fibrillation: Secondary | ICD-10-CM | POA: Diagnosis not present

## 2020-09-06 DIAGNOSIS — N402 Nodular prostate without lower urinary tract symptoms: Secondary | ICD-10-CM

## 2020-09-06 DIAGNOSIS — Z7901 Long term (current) use of anticoagulants: Secondary | ICD-10-CM | POA: Diagnosis not present

## 2020-09-06 DIAGNOSIS — D591 Autoimmune hemolytic anemia, unspecified: Secondary | ICD-10-CM | POA: Diagnosis present

## 2020-09-06 DIAGNOSIS — Z7952 Long term (current) use of systemic steroids: Secondary | ICD-10-CM | POA: Diagnosis not present

## 2020-09-06 DIAGNOSIS — Z79899 Other long term (current) drug therapy: Secondary | ICD-10-CM | POA: Diagnosis not present

## 2020-09-06 LAB — LACTATE DEHYDROGENASE: LDH: 191 U/L (ref 98–192)

## 2020-09-06 LAB — HEMOGLOBIN: Hemoglobin: 13 g/dL (ref 13.0–17.0)

## 2020-09-06 LAB — HEMATOCRIT: HCT: 41.8 % (ref 39.0–52.0)

## 2020-09-06 NOTE — Addendum Note (Signed)
Addended by: Tommy Rainwater on: 09/06/2020 11:07 AM   Modules accepted: Orders

## 2020-09-06 NOTE — Progress Notes (Signed)
09/06/2020 10:32 AM   [name] 1960-07-02 [id number]  Referring provider: Hughie Closs, PA-C [address] [place],  [place] [address]  Chief Complaint  Patient presents with  . Follow-up    Discuss MRI   Urological history: 1. High risk hematuria - incidental finding of 6-10 RBC's in 04/2020 - today's UA negative for micro heme  2. ED - SHIM 14 - contributing factors of age, BPH, HTN, obesity, CHF, CVA, anticoagulants and former smoker - managed with sildenafil 100 mg daily  3. Prostate nodule -prostate MRI 06/2020 No high-grade carcinoma within the peripheral zone.  PI-RADS: 1.  Enlarged nodular transitional zone most consistent benign prostate hypertrophy. PI-RADS: 2 Single small LEFT common iliac node.   HPI: [name] is a 60 y.o. who presents today for a monthly follow up after a trial of tadalafil, one low testosterone and microscopic hematuria.  He took the tadalafil 5 mg daily for three days, but he then developed a HA.  He discontinue the medication.    Patient still having spontaneous erections.  He denies any pain or curvature with erections. Patient has not been able to try sildenafil. Patient reports low libido.   SHIM    Row Name 09/06/20 1007         SHIM: Over the last 6 months:   How do you rate your confidence that you could get and keep an erection? Low     When you had erections with sexual stimulation, how often were your erections hard enough for penetration (entering your partner)? Sometimes (about half the time)     During sexual intercourse, how often were you able to maintain your erection after you had penetrated (entered) your partner? Sometimes (about half the time)     During sexual intercourse, how difficult was it to maintain your erection to completion of intercourse? Difficult     When you attempted sexual intercourse, how often was it satisfactory for you? Sometimes (about half the time)            SHIM Total Score   SHIM 14           Score: 14 today 1-7 Severe ED 8-11 Moderate ED 12-16 Mild-Moderate ED 17-21 Mild ED 22-25 No ED  He complains of frequency and urgency of urination.  He is also having nocturia x 2-3.  He has a history of night shift work.    Patient denies any modifying or aggravating factors.  Patient denies any gross hematuria, dysuria or suprapubic/flank pain.  Patient denies any fevers, chills, nausea or vomiting. Patient denies any burning with urination, but does have hesitancy with voiding.  PVR is 0 mL.    He states he drinks a lot of water.    He has no family history of prostate cancer.    PMH: Past Medical History:  Diagnosis Date  . Anemia 10/2015  . Gangrene of finger ([place])   . GERD (gastroesophageal reflux disease)   . Gout   . Hemolytic anemia ([place])   . History of nuclear stress test    a. 09/02/2014: no sig ischemia, GI uptake noted, no sig WMA, EF 48%, no EKG chanes concerning for ischemia, low risk scan    . HTN (hypertension)   . Normal coronary arteries    a. cardiac cath 12/03/2014: no sigificant CAD, right dominant system, LVEF >55%, no MR or AS  . Paroxysmal atrial flutter ([facility])    a. s/p successful TEE/DCCV on  08/28/2014-->was on xarelto, now on warfarin.  . Pulmonary fibrosis ([place])    a. not on home oxygen  . RA (rheumatoid arthritis) ([place])   . 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 echo 07/2014: EF 20-25%, cannot exclude atrial flutter,  global HK, possible bicuspid Ao valve, mod reduced RV sys fxn, mild-mod aortic valve sclerosis/calcification, mod TR, mildly elevated RVSP; b. TEE 08/28/2014: EF 30-35%, no intracardic thrombus, mildly dilated LA/RA, mild TR, mild-mod aortic sclerosis w/o stenosis; c. echo 09/2014: EF 55-60%, select images w/ inf HK; d. 10/2015 Echo: Ef 60-65%    Surgical History: Past Surgical History:  Procedure Laterality Date  . CARDIAC CATHETERIZATION N/A 12/03/2014   Procedure: Left Heart Cath and  Coronary Angiography;  Surgeon: Minna Merritts, MD;  Location: [facility];  Service: Cardiovascular;  Laterality: N/A;  . COLONOSCOPY WITH PROPOFOL N/A 11/23/2015   Procedure: COLONOSCOPY WITH PROPOFOL;  Surgeon: [name], MD;  Location: [facility];  Service: Endoscopy;  Laterality: N/A;  . COLONOSCOPY WITH PROPOFOL N/A 05/04/2017   Procedure: COLONOSCOPY WITH PROPOFOL;  Surgeon: [name], MD;  Location: [facility];  Service: Endoscopy;  Laterality: N/A;  . ESOPHAGOGASTRODUODENOSCOPY (EGD) WITH PROPOFOL N/A 11/23/2015   Procedure: ESOPHAGOGASTRODUODENOSCOPY (EGD) WITH PROPOFOL;  Surgeon: [name], MD;  Location: [facility];  Service: Endoscopy;  Laterality: N/A;  . HAND SURGERY Left   . PERIPHERAL VASCULAR CATHETERIZATION N/A 11/17/2015   Procedure: Upper Extremity Angiography;  Surgeon: Wellington Hampshire, MD;  Location: [facility];  Service: Cardiovascular;  Laterality: N/A;  . POLYPECTOMY  05/04/2017   Procedure: POLYPECTOMY;  Surgeon: [name], MD;  Location: [facility];  Service: Endoscopy;;    Home Medications:  Allergies as of 09/06/2020      Reactions   Allopurinol Diarrhea   Probenecid Other (See Comments), Nausea Only   Other reaction(s): Headache      Medication List       Accurate as of September 06, 2020 10:32 AM. If you have any questions, ask your nurse or doctor.        allopurinol 100 MG tablet Commonly known as: ZYLOPRIM Take 50 mg by mouth daily.   diltiazem 120 MG 24 hr capsule Commonly known as: CARDIZEM CD TAKE 1 CAPSULE EVERY DAY   enalapril 20 MG tablet Commonly known as: VASOTEC TAKE 1 TABLET EVERY DAY   hydrochlorothiazide 25 MG tablet Commonly known as: HYDRODIURIL TAKE 1 TABLET EVERY DAY   metoprolol tartrate 50 MG tablet Commonly known as: LOPRESSOR TAKE 1 AND 1/2 TABLETS (75 MG) BY MOUTH 2 (TWO) TIMES DAILY.   Multi-Vitamins Tabs Take by mouth.   pantoprazole 40 MG tablet Commonly known  as: PROTONIX Take 40 mg by mouth 2 (two) times daily.   potassium chloride SA 20 MEQ tablet Commonly known as: KLOR-CON TAKE 1 TABLET EVERY DAY   predniSONE 5 MG tablet Commonly known as: DELTASONE TAKE 2 TABLETS (10 MG TOTAL) BY MOUTH ONCE DAILY   sildenafil 100 MG tablet Commonly known as: VIAGRA Take 1 tablet (100 mg total) by mouth daily as needed for erectile dysfunction.   tadalafil 5 MG tablet Commonly known as: CIALIS Take 1 tablet (5 mg total) by mouth daily. May increase to 10mg  as needed   warfarin 5 MG tablet Commonly known as: COUMADIN Take as directed by the anticoagulation clinic. If you are unsure how to take this medication, talk to your nurse or doctor. Original instructions: Take by mouth.  Allergies:  Allergies  Allergen Reactions  . Allopurinol Diarrhea  . Probenecid Other (See Comments) and Nausea Only    Other reaction(s): Headache    Family History: Family History  Problem Relation Age of Onset  . Leukemia Mother     Social History:  reports that he quit smoking about 16 years ago. His smoking use included cigarettes. He has a 20.00 pack-year smoking history. He has never used smokeless tobacco. He reports that he does not drink alcohol and does not use drugs.  ROS: Pertinent ROS in HPI  Physical Exam: BP (!) 149/86   Pulse 80   Ht 5\' 10"  (1.778 m)   Wt 253 lb (114.8 kg)   BMI 36.30 kg/m   Constitutional:  Well nourished. Alert and oriented, No acute distress. HEENT: [place] AT, mask in place.  Trachea midline Cardiovascular: No clubbing, cyanosis, or edema. Respiratory: Normal respiratory effort, no increased work of breathing. Neurologic: Grossly intact, no focal deficits, moving all 4 extremities. Psychiatric: Normal mood and affect.  Laboratory Data: Results for orders placed or performed in visit on 08/04/20  POCT INR  Result Value Ref Range   INR 3.0 2.0 - 3.0  I have reviewed the labs.   Pertinent Imaging:  CLINICAL  DATA:  Enlarged prostate gland with nodule at the RIGHT apex. 60 year old male.  EXAM: MR PROSTATE WITHOUT AND WITH CONTRAST  TECHNIQUE: Multiplanar multisequence MRI images were obtained of the pelvis centered about the prostate. Pre and post contrast images were obtained.  CONTRAST:  82mL GADAVIST GADOBUTROL 1 MMOL/ML IV SOLN  COMPARISON:  None.  FINDINGS: Prostate: No foci of restricted diffusion within the peripheral zone (series 6). No focal lesion within the peripheral zone on T2 weighted imaging.  The transitional zone is enlarged by capsulated nodules. No suspicious imaging characteristics.  Postcontrast imaging demonstrates no suspicious enhancement pattern.  Volume: 4.7 x 3.9 by 4.6 cm (volume = 44 cm^3)  Transcapsular spread:  Absent  Seminal vesicle involvement: Absent  Neurovascular bundle involvement: Absent  Pelvic adenopathy: 5 mm LEFT common iliac lymph node (image 7/series 3)  Bone metastasis: None  Other findings: None  IMPRESSION: 1. No high-grade carcinoma within the peripheral zone.  PI-RADS: 1 2. Enlarged nodular transitional zone most consistent benign prostate hypertrophy. PI-RADS: 2 3. Single small LEFT common iliac node.   Electronically Signed   By: Suzy Bouchard M.D.   On: 07/16/2020 09:40 I have personally reviewed the images and agree with radiologist interpretation.    Assessment & Plan:    1. Prostate nodule  -No evidence for high grade carcinoma on prostate MRI -explained to the patient that his prostate cancer risk percentages are 13% high grade cancer and 15% low grade cancer would be found if biopsy was performed today  2. Erectile dysfunction - SHIM score is 14 - would like to try another PDE5i's - a prescription sent for sildenafil 100 mg, on-demand-dosing to [facility] not tried yet  - discussed the side effects of testosterone therapy and patient would like to defer treatment at this  time   Follow up: Return in about 6 months (around 03/08/2021) for PSA, I PSS, SHIM and office visit -PSA prior .  These notes generated with voice recognition software. I apologize for typographical errors.   I, Ardyth Gal, am acting as a Education administrator for [employer].    [facility] [address]  [place], [place] [address] [phone]

## 2020-09-15 ENCOUNTER — Ambulatory Visit (INDEPENDENT_AMBULATORY_CARE_PROVIDER_SITE_OTHER): Payer: Medicare HMO

## 2020-09-15 ENCOUNTER — Other Ambulatory Visit: Payer: Self-pay

## 2020-09-15 DIAGNOSIS — Z5181 Encounter for therapeutic drug level monitoring: Secondary | ICD-10-CM

## 2020-09-15 DIAGNOSIS — I483 Typical atrial flutter: Secondary | ICD-10-CM

## 2020-09-15 LAB — POCT INR: INR: 4.1 — AB (ref 2.0–3.0)

## 2020-09-15 NOTE — Patient Instructions (Signed)
-   skip your warfarin tonight, then  - continue dosage of 1 tablet warfarin every day EXCEPT 1/2 tablet on  - Recheck 4 weeks

## 2020-09-25 ENCOUNTER — Other Ambulatory Visit: Payer: Self-pay | Admitting: Cardiovascular Disease

## 2020-09-25 DIAGNOSIS — I10 Essential (primary) hypertension: Secondary | ICD-10-CM

## 2020-09-30 ENCOUNTER — Other Ambulatory Visit: Payer: Self-pay | Admitting: Cardiovascular Disease

## 2020-10-05 ENCOUNTER — Ambulatory Visit: Payer: Medicare HMO | Admitting: Cardiovascular Disease

## 2020-10-13 ENCOUNTER — Ambulatory Visit (INDEPENDENT_AMBULATORY_CARE_PROVIDER_SITE_OTHER): Payer: Medicare HMO

## 2020-10-13 ENCOUNTER — Encounter: Payer: Self-pay | Admitting: Nurse Practitioner

## 2020-10-13 ENCOUNTER — Ambulatory Visit: Payer: Medicare HMO | Admitting: Nurse Practitioner

## 2020-10-13 ENCOUNTER — Other Ambulatory Visit: Payer: Self-pay

## 2020-10-13 VITALS — BP 148/90 | HR 69 | Ht 70.0 in | Wt 248.5 lb

## 2020-10-13 DIAGNOSIS — R42 Dizziness and giddiness: Secondary | ICD-10-CM

## 2020-10-13 DIAGNOSIS — I5043 Acute on chronic combined systolic (congestive) and diastolic (congestive) heart failure: Secondary | ICD-10-CM

## 2020-10-13 DIAGNOSIS — I4892 Unspecified atrial flutter: Secondary | ICD-10-CM | POA: Diagnosis not present

## 2020-10-13 DIAGNOSIS — R000 Tachycardia, unspecified: Secondary | ICD-10-CM | POA: Diagnosis not present

## 2020-10-13 DIAGNOSIS — I10 Essential (primary) hypertension: Secondary | ICD-10-CM

## 2020-10-13 DIAGNOSIS — Z5181 Encounter for therapeutic drug level monitoring: Secondary | ICD-10-CM

## 2020-10-13 DIAGNOSIS — I43 Cardiomyopathy in diseases classified elsewhere: Secondary | ICD-10-CM

## 2020-10-13 DIAGNOSIS — I483 Typical atrial flutter: Secondary | ICD-10-CM | POA: Diagnosis not present

## 2020-10-13 LAB — POCT INR: INR: 3.3 — AB (ref 2.0–3.0)

## 2020-10-13 NOTE — Patient Instructions (Signed)
Medication Instructions:   Your physician recommends that you continue on your current medications as directed. Please refer to the Current Medication list given to you today.  *If you need a refill on your cardiac medications before your next appointment, please call your pharmacy*   Lab Work: None ordered  If you have labs (blood work) drawn today and your tests are completely normal, you will receive your results only by: [name] MyChart Message (if you have MyChart) OR . A paper copy in the mail If you have any lab test that is abnormal or we need to change your treatment, we will call you to review the results.   Testing/Procedures: None ordered   Follow-Up: At [facility], you and your health needs are our priority.  As part of our continuing mission to provide you with exceptional heart care, we have created designated Provider Care Teams.  These Care Teams include your primary Cardiologist (physician) and Advanced Practice Providers (APPs -  Physician Assistants and Nurse Practitioners) who all work together to provide you with the care you need, when you need it.  We recommend signing up for the patient portal called "MyChart".  Sign up information is provided on this After Visit Summary.  MyChart is used to connect with patients for Virtual Visits (Telemedicine).  Patients are able to view lab/test results, encounter notes, upcoming appointments, etc.  Non-urgent messages can be sent to your provider as well.   To learn more about what you can do with MyChart, go to [email or web address].    Your next appointment:   1 year(s)  The format for your next appointment:   In Person  Provider:   You may see Ida Rogue, MD or one of the following Advanced Practice Providers on your designated Care Team:    Murray Hodgkins, NP  Christell Faith, PA-C  Marrianne Mood, PA-C  Cadence Kathlen Mody, [place]  Laurann Montana, NP    Other Instructions  In 2 weeks please send in a  MyChart with your current Blood Pressure Readings.

## 2020-10-13 NOTE — Patient Instructions (Signed)
-   skip your warfarin tonight, then  - continue dosage of 1 tablet warfarin every day EXCEPT 1/2 tablet on  - Recheck 4 weeks 

## 2020-10-13 NOTE — Progress Notes (Signed)
Office Visit    Patient Name: [name] Date of Encounter: 10/13/2020  Primary Care Provider:  Hughie Closs, PA-C Primary Cardiologist:  Devin Rogue, MD  Chief Complaint    60 year old male with a history of tachycardia induced cardiomyopathy in the setting of paroxysmal atrial flutter with subsequent improvement of LV function, hypertension, pulmonary sarcoidosis and fibrosis, discoid lupus, rheumatoid arthritis, GERD, and anemia, presents for follow-up related to paroxysmal atrial flutter.  Past Medical History    Past Medical History:  Diagnosis Date  . Anemia 10/2015  . Discoid lupus   . Gangrene of finger ([place])   . GERD (gastroesophageal reflux disease)   . Gout   . Hemolytic anemia ([place])   . History of nuclear stress test    a. 09/02/2014: no sig ischemia, GI uptake noted, no sig WMA, EF 48%, no EKG chanes concerning for ischemia, low risk scan    . HTN (hypertension)   . Normal coronary arteries    a. cardiac cath 12/03/2014: no significant CAD, right dominant system, LVEF >55%, no MR or AS  . Paroxysmal atrial flutter ([facility])    a. s/p successful TEE/DCCV on 08/28/2014-->was on xarelto, now on warfarin.  . Pulmonary fibrosis ([place])    a. not on home oxygen  . Pulmonary sarcoidosis ([place])   . RA (rheumatoid arthritis) ([place])   . 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 echo 07/2014: EF 20-25%, cannot exclude atrial flutter,  global HK, possible bicuspid Ao valve, mod reduced RV sys fxn, mild-mod aortic valve sclerosis/calcification, mod TR, mildly elevated RVSP; b. TEE 08/28/2014: EF 30-35%, no intracardic thrombus, mildly dilated LA/RA, mild TR, mild-mod aortic sclerosis w/o stenosis; c. echo 09/2014: EF 55-60%, select images w/ inf HK; d. 10/2015 Echo: EF 60-65%   Past Surgical History:  Procedure Laterality Date  . CARDIAC CATHETERIZATION N/A 12/03/2014   Procedure: Left Heart Cath and Coronary Angiography;  Surgeon: [place] Merritts, MD;  Location:  [place];  Service: Cardiovascular;  Laterality: N/A;  . COLONOSCOPY WITH PROPOFOL N/A 11/23/2015   Procedure: COLONOSCOPY WITH PROPOFOL;  Surgeon: [place] Lame, MD;  Location: [facility];  Service: Endoscopy;  Laterality: N/A;  . COLONOSCOPY WITH PROPOFOL N/A 05/04/2017   Procedure: COLONOSCOPY WITH PROPOFOL;  Surgeon: [place]in Lame, MD;  Location: [facility];  Service: Endoscopy;  Laterality: N/A;  . ESOPHAGOGASTRODUODENOSCOPY (EGD) WITH PROPOFOL N/A 11/23/2015   Procedure: ESOPHAGOGASTRODUODENOSCOPY (EGD) WITH PROPOFOL;  Surgeon: [place]in Lame, MD;  Location: [facility];  Service: Endoscopy;  Laterality: N/A;  . HAND SURGERY Left   . PERIPHERAL VASCULAR CATHETERIZATION N/A 11/17/2015   Procedure: Upper Extremity Angiography;  Surgeon: [place]in Hampshire, MD;  Location: [facility];  Service: Cardiovascular;  Laterality: N/A;  . POLYPECTOMY  05/04/2017   Procedure: POLYPECTOMY;  Surgeon: [facility] Lame, MD;  Location: [place] [facility];  Service: Endoscopy;;    Allergies  Allergies  Allergen Reactions  . Allopurinol Diarrhea  . Probenecid Other (See Comments) and Nausea Only    Other reaction(s): Headache    History of Present Illness    60 year old male with above complex past medical history including paroxysmal atrial flutter, tachycardia induced cardiomyopathy, hypertension, pulmonary sarcoidosis and fibrosis, discoid lupus, rheumatoid arthritis, GERD, and anemia.  He was initially found to be in atrial flutter in September 2016, at which time his EF was 20 to 25%.  Ischemic evaluation showed no significant ischemia.  He subsequently underwent TEE and cardioversion in April 2016 and.  Following cardioversion, EF improved to 60-65% (June 2017).  In July 2016, he was admitted with chest pain and underwent diagnostic catheterization, which showed no significant coronary artery disease.  In August 2017, he was found to be in atrial flutter and rapid ventricular  response.  He was started on amiodarone and converted to sinus rhythm.  Amiodarone was subsequently reduced and discontinued.  He has been chronically anticoagulated with warfarin and followed closely in our anticoagulation clinic.  He is also followed closely by pulmonology in the setting of sarcoidosis and pulmonary fibrosis, and is on home O2.  [name] was last seen in cardiology clinic in May 2021, at which time he was maintaining sinus rhythm and doing well from a cardiac standpoint.  He notes chronic dyspnea on exertion but says that he is trying to be more active with a goal of weight loss.  He had gotten up to 258 pounds earlier this year, and is down 10 pounds since then.  He is more closely watching what he is eating.  He denies chest pain or palpitations.  In April, he was diagnosed with vertigo and was treated with meclizine and subsequently antibiotics for concern related to a sinus infection.  He notes improvement in vertiginous symptoms but has been experiencing mild orthostasis when he stands up to quickly.  He does not routinely check his blood pressures at home but is hypertensive today.  He denies PND, orthopnea, syncope, or early satiety.  He does sometimes experience lower extremity swelling, especially after sitting for long periods.  Home Medications    Prior to Admission medications   Medication Sig Start Date End Date Taking? Authorizing Provider  allopurinol (ZYLOPRIM) 100 MG tablet Take 50 mg by mouth daily.  08/01/19   [provider]  diltiazem (CARDIZEM CD) 120 MG 24 hr capsule TAKE 1 CAPSULE EVERY DAY 09/30/20   Devin Merritts, MD  enalapril (VASOTEC) 20 MG tablet TAKE 1 TABLET EVERY DAY 08/16/20   Devin Merritts, MD  hydrochlorothiazide (HYDRODIURIL) 25 MG tablet TAKE 1 TABLET EVERY DAY 09/27/20   Devin Merritts, MD  metoprolol tartrate (LOPRESSOR) 50 MG tablet TAKE 1 AND 1/2 TABLETS (75 MG) BY MOUTH 2 (TWO) TIMES DAILY. 09/30/20   Devin Merritts, MD   Multiple Vitamin (MULTI-VITAMINS) TABS Take by mouth.    [provider]  pantoprazole (PROTONIX) 40 MG tablet Take 40 mg by mouth 2 (two) times daily.    [provider]  potassium chloride SA (KLOR-CON) 20 MEQ tablet TAKE 1 TABLET EVERY DAY 09/30/20   Gollan, Kathlene November, MD  predniSONE (DELTASONE) 5 MG tablet TAKE 2 TABLETS (10 MG TOTAL) BY MOUTH ONCE DAILY 06/07/20   [provider]  sildenafil (VIAGRA) 100 MG tablet Take 1 tablet (100 mg total) by mouth daily as needed for erectile dysfunction. 06/28/20   Zara Council A, PA-C  tadalafil (CIALIS) 5 MG tablet Take 1 tablet (5 mg total) by mouth daily. May increase to 10mg  as needed 05/25/20   Billey Co, MD  warfarin (COUMADIN) 5 MG tablet Take by mouth.    [provider]    Review of Systems    Chronic dyspnea on exertion, which has been stable.  Had vertigo earlier this year which seems to have resolved.  Does have mild orthostatic lightheadedness.  Occasionally notes lower extremity swelling, especially after sitting for long periods.  He denies chest pain, palpitations, PND, orthopnea, syncope, or early satiety.  All other systems reviewed  and are otherwise negative except as noted above.  Physical Exam    VS:  BP (!) 148/90 (BP Location: Left Arm, Patient Position: Sitting, Cuff Size: Normal)   Pulse 69   Ht 5\' 10"  (1.778 m)   Wt 248 lb 8 oz (112.7 kg)   SpO2 95%   BMI 35.66 kg/m  , BMI Body mass index is 35.66 kg/m.  Orthostatic VS for the past 24 hrs:  BP- Lying Pulse- Lying BP- Sitting Pulse- Sitting BP- Standing at 0 minutes Pulse- Standing at 0 minutes  10/13/20 1048 139/88 64 116/63 71 127/84 66    GEN: Obese, in no acute distress. HEENT: normal. Neck: Supple, no JVD, carotid bruits, or masses. Cardiac: RRR, no murmurs, rubs, or gallops. No clubbing, cyanosis, trace bilateral ankle edema.  Radials/DP/PT 2+ and equal bilaterally.  Respiratory:  Respirations regular and unlabored,  crackles at bilateral bases. GI: Obese, soft, nontender, nondistended, BS + x 4. MS: no deformity or atrophy. Skin: warm and dry, no rash. Neuro:  Strength and sensation are intact. Psych: Normal affect.  Accessory Clinical Findings    ECG personally reviewed by me today -regular sinus rhythm, 69, PVC, left axis deviation, left anterior fascicular block, LVH- no acute changes.  **Labs dated 05/11/2020 from [facility] Hemoglobin 13.8, hematocrit 46.3, WBC 12.8 Creatinine 1.0 AST 15, ALT 21 Lipids dated June 06, 2019: Total cholesterol 183, triglycerides 171, HDL 44, LDL 105  Assessment & Plan    1.  Paroxysmal atrial flutter: No palpitations over the past year and maintaining sinus rhythm on ECG today.  Previously on amiodarone but this was discontinued in the setting of pulmonary fibrosis.  He remains on beta-blocker and diltiazem therapy and is tolerating well.  He is on chronic warfarin and is followed closely in our anticoagulation clinic.  2.  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  He has been stable over the past year, with chronic stable dyspnea on exertion in the setting of pulmonary fibrosis.  He does occasionally note mild lower extremity swelling, especially after sitting for long periods.  He is on HCTZ.  He is also looking to buy compression socks.  He has only trace lower extremity swelling today.  Blood pressure is elevated today however, he does not know how it trends at home, and is mildly orthostatic and symptomatic on exam.  I've asked him to follow his BPs @ home.  He has been reducing salt in his diet and has been increasing activity with a focus on weight loss.  3.  Essential hypertension/Orhotstatic lightheadedness: Blood pressure elevated today at 148/90.  He has been having  some orthostatic symptoms, especially when getting up too quickly.  Orthostatic vital signs today notable for a drop from 139/88 while lying to 116/63 while sitting, associated with lightheadedness.  Pressures rose back to baseline with standing.  In that setting, I have recommended that he follow blood pressures at home over the next few weeks and report back with some numbers.  Regarding orthostasis, I have encouraged him to change positions more deliberately, and consider wearing compression socks.  4.  Pulmonary fibrosis: Followed closely by pulmonology.  He remains on oxygen therapy around-the-clock and notes stability.  5.  Disposition: Follow-up in 1 year or sooner if necessary.  He will contact us in a few weeks with blood pressures  if he is trending greater than [id number] systolic.  With orthostatic lightheadedness, would not be more aggressive with his antihypertensive regimen at this time.   Murray Hodgkins, NP 10/13/2020, 10:41 AM

## 2020-10-25 NOTE — Telephone Encounter (Signed)
Dosing options would be Eliquis 5 twice daily which we prefer Over Xarelto 20 daily He can check the prices on the 2 and go with either over warfarin

## 2020-10-27 ENCOUNTER — Ambulatory Visit (INDEPENDENT_AMBULATORY_CARE_PROVIDER_SITE_OTHER): Payer: Medicare HMO

## 2020-10-27 ENCOUNTER — Other Ambulatory Visit: Payer: Self-pay

## 2020-10-27 DIAGNOSIS — Z5181 Encounter for therapeutic drug level monitoring: Secondary | ICD-10-CM

## 2020-10-27 DIAGNOSIS — I483 Typical atrial flutter: Secondary | ICD-10-CM

## 2020-10-27 LAB — POCT INR: INR: 2.6 (ref 2.0–3.0)

## 2020-10-27 MED ORDER — APIXABAN 5 MG PO TABS: 5.0000 mg | ORAL_TABLET | Freq: Two times a day (BID) | ORAL | 1 refills | Status: DC

## 2020-10-27 NOTE — Progress Notes (Signed)
Pt was started on Eliquis for paroxysmal atrial flutter on October 27, 2020    Reviewed patients medication list.  Pt is not currently on any combined P-gp and strong CYP3A4 inhibitors/inducers (ketoconazole, traconazole, ritonavir, carbamazepine, phenytoin, rifampin, St. John's wort).  Reviewed labs.  SCr 1.0, Weight 112.7 kg, CrCl- 105.11.  Dose appropriate based on age, weight, and SCr.  Hgb and HCT Within Normal Limits  A full discussion of the nature of anticoagulants has been carried out.  A benefit/risk analysis has been presented to the patient, so that they understand the justification for choosing anticoagulation with Eliquis at this time.  The need for compliance is stressed.  Pt is aware to take the medication twice daily.  Side effects of potential bleeding are discussed, including unusual colored urine or stools, coughing up blood or coffee ground emesis, nose bleeds or serious fall or head trauma.  Discussed signs and symptoms of stroke. The patient should avoid any OTC items containing aspirin or ibuprofen.  Avoid alcohol consumption.   Call if any signs of abnormal bleeding.  Discussed financial obligations and resolved any difficulty in obtaining medication.  Next lab test in 6 months.

## 2020-10-27 NOTE — Patient Instructions (Signed)
-   have a large serving of greens today - NO MORE WARFARIN - tomorrow night, start taking Eliquis twice a day

## 2020-10-29 ENCOUNTER — Other Ambulatory Visit: Payer: Self-pay | Admitting: Cardiovascular Disease

## 2020-12-06 ENCOUNTER — Other Ambulatory Visit: Payer: Self-pay

## 2020-12-06 ENCOUNTER — Inpatient Hospital Stay: Payer: Medicare HMO | Attending: Internal Medicine

## 2020-12-06 ENCOUNTER — Encounter: Payer: Self-pay | Admitting: Internal Medicine

## 2020-12-06 ENCOUNTER — Inpatient Hospital Stay (HOSPITAL_BASED_OUTPATIENT_CLINIC_OR_DEPARTMENT_OTHER): Payer: Medicare HMO | Admitting: Internal Medicine

## 2020-12-06 DIAGNOSIS — D591 Autoimmune hemolytic anemia, unspecified: Secondary | ICD-10-CM | POA: Diagnosis present

## 2020-12-06 DIAGNOSIS — D594 Other nonautoimmune hemolytic anemias: Secondary | ICD-10-CM

## 2020-12-06 DIAGNOSIS — Z79899 Other long term (current) drug therapy: Secondary | ICD-10-CM | POA: Insufficient documentation

## 2020-12-06 DIAGNOSIS — Z7901 Long term (current) use of anticoagulants: Secondary | ICD-10-CM | POA: Diagnosis not present

## 2020-12-06 DIAGNOSIS — I4891 Unspecified atrial fibrillation: Secondary | ICD-10-CM | POA: Diagnosis not present

## 2020-12-06 LAB — BASIC METABOLIC PANEL
Anion gap: 10 (ref 5–15)
BUN: 21 mg/dL (ref 8–23)
CO2: 25 mmol/L (ref 22–32)
Calcium: 9.3 mg/dL (ref 8.9–10.3)
Chloride: 106 mmol/L (ref 98–111)
Creatinine, Ser: 1.06 mg/dL (ref 0.61–1.24)
GFR, Estimated: >60 mL/min (ref >60)
Glucose, Bld: 86 mg/dL (ref 70–99)
Potassium: 3.4 mmol/L — ABNORMAL LOW (ref 3.5–5.1)
Sodium: 141 mmol/L (ref 135–145)

## 2020-12-06 LAB — CBC WITH DIFFERENTIAL/PLATELET
Abs Immature Granulocytes: 0.25 10*3/uL — ABNORMAL HIGH (ref 0.00–0.07)
Basophils Absolute: 0.1 10*3/uL (ref 0.0–0.1)
Basophils Relative: 1 %
Eosinophils Absolute: 0.2 10*3/uL (ref 0.0–0.5)
Eosinophils Relative: 2 %
HCT: 42.7 % (ref 39.0–52.0)
Hemoglobin: 13 g/dL (ref 13.0–17.0)
Immature Granulocytes: 2 %
Lymphocytes Relative: 18 %
Lymphs Abs: 2.3 10*3/uL (ref 0.7–4.0)
MCH: 27.1 pg (ref 26.0–34.0)
MCHC: 30.4 g/dL (ref 30.0–36.0)
MCV: 89.1 fL (ref 80.0–100.0)
Monocytes Absolute: 1.3 10*3/uL — ABNORMAL HIGH (ref 0.1–1.0)
Monocytes Relative: 10 %
Neutro Abs: 8.8 10*3/uL — ABNORMAL HIGH (ref 1.7–7.7)
Neutrophils Relative %: 67 %
Platelets: 208 10*3/uL (ref 150–400)
RBC: 4.79 MIL/uL (ref 4.22–5.81)
RDW: 15.4 % (ref 11.5–15.5)
WBC: 12.9 10*3/uL — ABNORMAL HIGH (ref 4.0–10.5)
nRBC: 0 % (ref 0.0–0.2)

## 2020-12-06 LAB — LACTATE DEHYDROGENASE: LDH: 159 U/L (ref 98–192)

## 2020-12-06 NOTE — Progress Notes (Signed)
[facility] OFFICE PROGRESS NOTE  Patient Care Team: Vallery Sa as PCP - General (Physician Assistant) Minna Merritts, MD as PCP - Cardiology (Cardiology) Minna Merritts, MD as Consulting Physician (Cardiology)   Oncology History   No history exists.   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  male pleasant patient above history of Autoimmune hemolytic anemia- currently on prednisone 10 mg/CellCept 500 twice a day [also for Autoimmune/connective tissue disorder]- is here for follow-up.   Patient's appetite is good.  Denies any nausea vomiting.  Chronic mild joint pains.  Patient continues to be concerned about weight gain.  Review of Systems  Constitutional:  Negative for chills, diaphoresis, fever, malaise/fatigue and weight loss.  HENT:  Negative for nosebleeds and sore throat.   Eyes:  Negative for double vision.  Respiratory:  Negative for cough, hemoptysis, sputum production, shortness of breath and wheezing.   Cardiovascular:  Negative for chest pain, palpitations, orthopnea and leg swelling.  Gastrointestinal:  Negative for abdominal pain, blood in stool, constipation, diarrhea, heartburn, melena, nausea and vomiting.  Genitourinary:  Negative for dysuria, frequency and urgency.  Musculoskeletal:  Positive for back pain and joint pain.  Skin: Negative.  Negative for itching and rash.  Neurological:  Negative for dizziness, tingling, focal weakness, weakness and headaches.  Endo/Heme/Allergies:  Does not bruise/bleed easily.  Psychiatric/Behavioral:  Negative for  depression. The patient is not nervous/anxious and does not have insomnia.     PAST MEDICAL HISTORY :  Past Medical History:  Diagnosis Date   Anemia 10/2015   Discoid lupus    Gangrene of finger (HCC)    GERD (gastroesophageal reflux disease)    Gout    Hemolytic anemia (HCC)    History of nuclear stress test    a. 09/02/2014: no sig ischemia, GI uptake noted, no sig WMA, EF 48%, no EKG chanes concerning for ischemia, low risk scan     HTN (hypertension)    Normal coronary arteries    a. cardiac cath 12/03/2014: no significant CAD, right dominant system, LVEF >55%, no MR or AS   Paroxysmal atrial flutter (HCC)    a. s/p successful TEE/DCCV on 08/28/2014-->was on xarelto, now on warfarin.   Pulmonary fibrosis (HCC)    a. 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 echo 07/2014: EF 20-25%, cannot exclude atrial flutter,  global HK, possible bicuspid Ao valve, mod reduced RV sys fxn, mild-mod aortic valve sclerosis/calcification, mod TR, mildly elevated RVSP; b. TEE 08/28/2014: EF 30-35%, no intracardic thrombus, mildly dilated LA/RA, mild TR, mild-mod aortic sclerosis w/o stenosis; c. echo 09/2014: EF 55-60%, select images w/ inf HK; d. 10/2015 Echo: EF 60-65%    PAST SURGICAL HISTORY :   Past Surgical History:  Procedure Laterality Date   CARDIAC CATHETERIZATION N/A 12/03/2014   Procedure: Left Heart Cath and Coronary Angiography;  Surgeon: Minna Merritts, MD;  Location: [facility];  Service: Cardiovascular;  Laterality: N/A;   COLONOSCOPY WITH PROPOFOL N/A 11/23/2015   Procedure: COLONOSCOPY WITH PROPOFOL;  Surgeon: [name], MD;  Location:  [facility];  Service: Endoscopy;  Laterality: N/A;   COLONOSCOPY WITH PROPOFOL N/A 05/04/2017   Procedure: COLONOSCOPY WITH PROPOFOL;  Surgeon: [name], MD;  Location: [facility];  Service: Endoscopy;  Laterality: N/A;    ESOPHAGOGASTRODUODENOSCOPY (EGD) WITH PROPOFOL N/A 11/23/2015   Procedure: ESOPHAGOGASTRODUODENOSCOPY (EGD) WITH PROPOFOL;  Surgeon: [name], MD;  Location: [facility];  Service: Endoscopy;  Laterality: N/A;   HAND SURGERY Left    PERIPHERAL VASCULAR CATHETERIZATION N/A 11/17/2015   Procedure: Upper Extremity Angiography;  Surgeon: Wellington Hampshire, MD;  Location: [facility];  Service: Cardiovascular;  Laterality: N/A;   POLYPECTOMY  05/04/2017   Procedure: POLYPECTOMY;  Surgeon: [name], MD;  Location: [facility];  Service: Endoscopy;;    FAMILY HISTORY :   Family History  Problem Relation Age of Onset   Leukemia Mother     SOCIAL HISTORY:   Social History   Tobacco Use   Smoking status: Former    Packs/day: 1.00    Years: 20.00    Pack years: 20.00    Types: Cigarettes    Quit date: 08/30/2004    Years since quitting: 16.2   Smokeless tobacco: Never  Vaping Use   Vaping Use: Never used  Substance Use Topics   Alcohol use: No   Drug use: No    ALLERGIES:  is allergic to allopurinol and probenecid.  MEDICATIONS:  Current Outpatient Medications  Medication Sig Dispense Refill   albuterol (VENTOLIN HFA) 108 (90 Base) MCG/ACT inhaler Inhale into the lungs as needed.     apixaban (ELIQUIS) 5 MG TABS tablet Take 1 tablet (5 mg total) by mouth 2 (two) times daily. 180 tablet 1   diltiazem (CARDIZEM CD) 120 MG 24 hr capsule TAKE 1 CAPSULE EVERY DAY 90 capsule 0   enalapril (VASOTEC) 20 MG tablet TAKE 1 TABLET EVERY DAY 90 tablet 1   fluticasone (FLONASE) 50 MCG/ACT nasal spray Place into the nose as needed.     fluticasone-salmeterol (ADVAIR) 250-50 MCG/ACT AEPB Inhale into the lungs as needed.     hydrochlorothiazide (HYDRODIURIL) 25 MG tablet TAKE 1 TABLET EVERY DAY 90 tablet 0   ipratropium (ATROVENT) 0.03 % nasal spray Place into the nose as needed.     metoprolol tartrate (LOPRESSOR) 50 MG tablet TAKE 1 AND 1/2 TABLETS (75 MG) BY MOUTH 2 (TWO)  TIMES DAILY. 270 tablet 0   Multiple Vitamin (MULTI-VITAMINS) TABS Take by mouth.     pantoprazole (PROTONIX) 40 MG tablet Take 40 mg by mouth 2 (two) times daily.     potassium chloride SA (KLOR-CON) 20 MEQ tablet TAKE 1 TABLET EVERY DAY 90 tablet 0   predniSONE (DELTASONE) 5 MG tablet Take 10 mg by mouth daily with breakfast.     sildenafil (VIAGRA) 100 MG tablet Take 1 tablet (100 mg total) by mouth daily as needed for erectile dysfunction. 30 tablet 2   tadalafil (CIALIS) 5 MG tablet Take 1 tablet (5 mg total) by mouth daily. May increase to 10mg  as needed 30 tablet 1   No current facility-administered medications for this visit.    PHYSICAL EXAMINATION:   BP 140/82   Pulse 92   Temp 97.7 F (36.5 C)   Resp 20   Wt 256 lb 9.6 oz (116.4 kg)   SpO2 98%   BMI 36.82 kg/m   Filed Weights   12/06/20 1014  Weight: 256 lb 9.6 oz (116.4 kg)   Physical Exam HENT:     Head:  Normocephalic and atraumatic.     Mouth/Throat:     Pharynx: No oropharyngeal exudate.  Eyes:     Pupils: Pupils are equal, round, and reactive to light.  Cardiovascular:     Rate and Rhythm: Normal rate and regular rhythm.  Pulmonary:     Effort: Pulmonary effort is normal. No respiratory distress.     Breath sounds: Normal breath sounds. No wheezing.  Abdominal:     General: Bowel sounds are normal. There is no distension.     Palpations: Abdomen is soft. There is no mass.     Tenderness: There is no abdominal tenderness. There is no guarding or rebound.  Musculoskeletal:        General: No tenderness. Normal range of motion.     Cervical back: Normal range of motion and neck supple.  Skin:    General: Skin is warm.  Neurological:     Mental Status: He is alert and oriented to person, place, and time.  Psychiatric:        Mood and Affect: Affect normal.    LABORATORY DATA:  I have reviewed the data as listed    Component Value Date/Time   NA 141 12/06/2020 1001   NA 138 12/11/2014 1518   NA  137 08/27/2014 0403   K 3.4 (L) 12/06/2020 1001   K 4.1 08/27/2014 0403   CL 106 12/06/2020 1001   CL 108 08/27/2014 0403   CO2 25 12/06/2020 1001   CO2 26 08/27/2014 0403   GLUCOSE 86 12/06/2020 1001   GLUCOSE 94 08/27/2014 0403   BUN 21 12/06/2020 1001   BUN 24 12/11/2014 1518   BUN 16 08/27/2014 0403   CREATININE 1.06 12/06/2020 1001   CREATININE 0.85 08/27/2014 0403   CALCIUM 9.3 12/06/2020 1001   CALCIUM 8.6 (L) 08/27/2014 0403   PROT 6.9 11/25/2019 0958   ALBUMIN 3.7 11/25/2019 0958   AST 16 11/25/2019 0958   ALT 17 11/25/2019 0958   ALKPHOS 45 11/25/2019 0958   BILITOT 0.4 11/25/2019 0958   GFRNONAA >60 12/06/2020 1001   GFRNONAA >60 08/27/2014 0403   GFRAA >60 11/25/2019 0958   GFRAA >60 08/27/2014 0403    No results found for: SPEP, UPEP  Lab Results  Component Value Date   WBC 12.9 (H) 12/06/2020   NEUTROABS 8.8 (H) 12/06/2020   HGB 13.0 12/06/2020   HCT 42.7 12/06/2020   MCV 89.1 12/06/2020   PLT 208 12/06/2020      Chemistry      Component Value Date/Time   NA 141 12/06/2020 1001   NA 138 12/11/2014 1518   NA 137 08/27/2014 0403   K 3.4 (L) 12/06/2020 1001   K 4.1 08/27/2014 0403   CL 106 12/06/2020 1001   CL 108 08/27/2014 0403   CO2 25 12/06/2020 1001   CO2 26 08/27/2014 0403   BUN 21 12/06/2020 1001   BUN 24 12/11/2014 1518   BUN 16 08/27/2014 0403   CREATININE 1.06 12/06/2020 1001   CREATININE 0.85 08/27/2014 0403      Component Value Date/Time   CALCIUM 9.3 12/06/2020 1001   CALCIUM 8.6 (L) 08/27/2014 0403   ALKPHOS 45 11/25/2019 0958   AST 16 11/25/2019 0958   ALT 17 11/25/2019 0958   BILITOT 0.4 11/25/2019 0958       RADIOGRAPHIC STUDIES: I have personally reviewed the radiological images as listed and agreed with the findings in the report. No results found.   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  # Hemoglobin today 13.0 No evidence of hemolysis. STABLE.   # History of A. fib on Coumadin- STABLE.   # weight gain: Secondary steroids.  Discussed regarding cutting down on carbs; increasing green vegetables/also lean meat like chicken/fish etc.  Also discussed increasing physical activity/exercises.  Patient is motivated.  # DISPOSITION:[facility] #  H&H/LDH in 3 months [[facility]];  # follow up in 6 months;-MD/[facility]-CBC/LDH/BMP; Haptoglobin-Dr.B    Orders Placed This Encounter  Procedures    Hemoglobin and Hematocrit    Standing Status:   Future    Standing Expiration Date:   12/06/2021   Lactate dehydrogenase    Standing Status:   Future    Standing Expiration Date:   12/06/2021   CBC with Differential/Platelet    Standing Status:   Future    Standing Expiration Date:   12/06/2021   Lactate dehydrogenase    Standing Status:   Future    Standing Expiration Date:   6/38/4536   Basic metabolic panel    Standing Status:   Future    Standing Expiration Date:   12/06/2021   All questions were answered. The patient knows to call the clinic with any problems, questions or concerns.      Cammie Sickle, MD 12/06/2020 11:30 AM

## 2020-12-06 NOTE — Assessment & Plan Note (Addendum)
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  # Hemoglobin today 13.0 No evidence of hemolysis. STABLE.   # History of A. fib on Coumadin- STABLE.   # weight gain: Secondary steroids.  Discussed regarding cutting down on carbs; increasing green vegetables/also lean meat like chicken/fish etc.  Also discussed increasing physical activity/exercises.  Patient is motivated.  # DISPOSITION:[facility] #  H&H/LDH in 3 months [[facility]];  # follow up in 6 months;-MD/[facility]-CBC/LDH/BMP; Haptoglobin-Dr.B

## 2020-12-07 LAB — HAPTOGLOBIN: Haptoglobin: 210 mg/dL (ref 32–363)

## 2021-02-21 ENCOUNTER — Other Ambulatory Visit: Payer: Self-pay

## 2021-02-21 DIAGNOSIS — I10 Essential (primary) hypertension: Secondary | ICD-10-CM

## 2021-02-21 MED ORDER — DILTIAZEM HCL ER COATED BEADS 120 MG PO CP24: 120.0000 mg | ORAL_CAPSULE | Freq: Every day | ORAL | 1 refills | Status: DC

## 2021-02-21 MED ORDER — HYDROCHLOROTHIAZIDE 25 MG PO TABS: 25.0000 mg | ORAL_TABLET | Freq: Every day | ORAL | 1 refills | Status: DC

## 2021-02-21 MED ORDER — POTASSIUM CHLORIDE CRYS ER 20 MEQ PO TBCR: 20.0000 meq | EXTENDED_RELEASE_TABLET | Freq: Every day | ORAL | 1 refills | Status: DC

## 2021-02-21 MED ORDER — METOPROLOL TARTRATE 50 MG PO TABS: ORAL_TABLET | ORAL | 1 refills | Status: DC

## 2021-03-04 NOTE — Progress Notes (Signed)
09/06/2020 9:48 AM   [name] Aug 04, 1960 [id number]  Referring provider: Hughie Closs, PA-C No address on file  Chief Complaint  Patient presents with   Follow-up    [facility]mh follow-up    Urological history: 1. High risk hematuria -former smoker -no formal workup -incidental finding of 6-10 RBC's in 04/2020 -UA negative for micro heme.    2. ED -contributing factors of age, BPH, HTN, obesity, CHF, CVA, anticoagulants and former smoker -SHIM 12 -managed with sildenafil 100 mg daily, on-demand-dosing  3. Prostate nodule -PSA pending -incidental finding of a 1 cm nodule in the right apex is appreciated on 05/2020 exam -prostate MRI 06/2020 No high-grade carcinoma within the peripheral zone.  PI-RADS: 1.  Enlarged nodular transitional zone most consistent benign prostate hypertrophy. PI-RADS: 2 Single small LEFT common iliac node.  4. BPH with LU TS -prostate volume 44 cc on 2022 MRI  -I PSS 13/4 -PVR 0 mL in 05/2020  HPI: Devin Hallingis a 60y.o. male who presents today for a 6 month follow up.  He is drinking 64 ounces of water daily.  He is having urinary frequency (6 to 7 times) and nocturia x 1.  Patient denies any modifying or aggravating factors.  Patient denies any gross hematuria, dysuria or suprapubic/flank pain.  Patient denies any fevers, chills, nausea or vomiting.    He has also noticed some scrotal swelling over the last few months.    IPSS     Row Name 03/07/21 0900         International Prostate Symptom Score   How often have you had the sensation of not emptying your bladder? Less than half the time     How often have you had to urinate less than every two hours? More than half the time     How often have you found you stopped and started again several times when you urinated? About half the time     How often have you found it difficult to postpone urination? Not at All     How often have you had a weak urinary  stream? About half the time     How often have you had to strain to start urination? Not at All     How many times did you typically get up at night to urinate? 1 Time     Total IPSS Score 13           Quality of Life due to urinary symptoms   If you were to spend the rest of your life with your urinary condition just the way it is now how would you feel about that? Mostly Disatisfied              Score:  1-7 Mild 8-19 Moderate 20-35 Severe   He has not tried the sildenafil 100 mg, on-demand-dosing.     SHIM     Row Name 03/07/21 [phone]        SHIM: Over the last 6 months:   How do you rate your confidence that you could get and keep an erection? Moderate     When you had erections with sexual stimulation, how often were your erections hard enough for penetration (entering your partner)? A Few Times (much less than half the time)     During sexual intercourse, how often were you able to maintain your erection after you had penetrated (entered) your partner? A Few Times (much less than half  the time)     During sexual intercourse, how difficult was it to maintain your erection to completion of intercourse? Difficult     When you attempted sexual intercourse, how often was it satisfactory for you? A Few Times (much less than half the time)           SHIM Total Score   SHIM 12              Score: 1-7 Severe ED 8-11 Moderate ED 12-16 Mild-Moderate ED 17-21 Mild ED 22-25 No ED    PMH: Past Medical History:  Diagnosis Date   Anemia 10/2015   Discoid lupus    Gangrene of finger (HCC)    GERD (gastroesophageal reflux disease)    Gout    Hemolytic anemia (HCC)    History of nuclear stress test    a. 09/02/2014: no sig ischemia, GI uptake noted, no sig WMA, EF 48%, no EKG chanes concerning for ischemia, low risk scan     HTN (hypertension)    Normal coronary arteries    a. cardiac cath 12/03/2014: no significant CAD, right dominant system, LVEF >55%, no MR or AS    Paroxysmal atrial flutter (HCC)    a. s/p successful TEE/DCCV on 08/28/2014-->was on xarelto, now on warfarin.   Pulmonary fibrosis (HCC)    a. 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 echo 07/2014: EF 20-25%, cannot exclude atrial flutter,  global HK, possible bicuspid Ao valve, mod reduced RV sys fxn, mild-mod aortic valve sclerosis/calcification, mod TR, mildly elevated RVSP; b. TEE 08/28/2014: EF 30-35%, no intracardic thrombus, mildly dilated LA/RA, mild TR, mild-mod aortic sclerosis w/o stenosis; c. echo 09/2014: EF 55-60%, select images w/ inf HK; d. 10/2015 Echo: EF 60-65%    Surgical History: Past Surgical History:  Procedure Laterality Date   CARDIAC CATHETERIZATION N/A 12/03/2014   Procedure: Left Heart Cath and Coronary Angiography;  Surgeon: Minna Merritts, MD;  Location: [facility];  Service: Cardiovascular;  Laterality: N/A;   COLONOSCOPY WITH PROPOFOL N/A 11/23/2015   Procedure: COLONOSCOPY WITH PROPOFOL;  Surgeon: [name], MD;  Location: [facility];  Service: Endoscopy;  Laterality: N/A;   COLONOSCOPY WITH PROPOFOL N/A 05/04/2017   Procedure: COLONOSCOPY WITH PROPOFOL;  Surgeon: [name], MD;  Location: [facility];  Service: Endoscopy;  Laterality: N/A;   ESOPHAGOGASTRODUODENOSCOPY (EGD) WITH PROPOFOL N/A 11/23/2015   Procedure: ESOPHAGOGASTRODUODENOSCOPY (EGD) WITH PROPOFOL;  Surgeon: [name], MD;  Location: [facility];  Service: Endoscopy;  Laterality: N/A;   HAND SURGERY Left    PERIPHERAL VASCULAR CATHETERIZATION N/A 11/17/2015   Procedure: Upper Extremity Angiography;  Surgeon: Wellington Hampshire, MD;  Location: [facility];  Service: Cardiovascular;  Laterality: N/A;   POLYPECTOMY  05/04/2017   Procedure: POLYPECTOMY;  Surgeon: [name], MD;  Location: [facility];  Service: Endoscopy;;    Home Medications:  Allergies as of  03/07/2021       Reactions   Allopurinol Diarrhea   Probenecid Other (See Comments), Nausea Only   Other reaction(s): Headache        Medication List        Accurate as of March 07, 2021  9:48 AM. If you have any questions, ask your nurse or doctor.          albuterol 108 (90 Base) MCG/ACT inhaler Commonly known as: VENTOLIN HFA Inhale into  the lungs as needed.   apixaban 5 MG Tabs tablet Commonly known as: ELIQUIS Take 1 tablet (5 mg total) by mouth 2 (two) times daily.   diltiazem 120 MG 24 hr capsule Commonly known as: CARDIZEM CD Take 1 capsule (120 mg total) by mouth daily.   enalapril 20 MG tablet Commonly known as: VASOTEC TAKE 1 TABLET EVERY DAY   fluticasone 50 MCG/ACT nasal spray Commonly known as: FLONASE Place into the nose as needed.   fluticasone-salmeterol 250-50 MCG/ACT Aepb Commonly known as: ADVAIR Inhale into the lungs as needed.   hydrochlorothiazide 25 MG tablet Commonly known as: HYDRODIURIL Take 1 tablet (25 mg total) by mouth daily.   ipratropium 0.03 % nasal spray Commonly known as: ATROVENT Place into the nose as needed.   metoprolol tartrate 50 MG tablet Commonly known as: LOPRESSOR TAKE 1 AND 1/2 TABLETS (75 MG) BY MOUTH 2 (TWO) TIMES DAILY.   Multi-Vitamins Tabs Take by mouth.   pantoprazole 40 MG tablet Commonly known as: PROTONIX Take 40 mg by mouth 2 (two) times daily.   potassium chloride SA 20 MEQ tablet Commonly known as: KLOR-CON Take 1 tablet (20 mEq total) by mouth daily.   predniSONE 5 MG tablet Commonly known as: DELTASONE Take 10 mg by mouth daily with breakfast.   sildenafil 100 MG tablet Commonly known as: VIAGRA Take 1 tablet (100 mg total) by mouth daily as needed for erectile dysfunction.   tadalafil 5 MG tablet Commonly known as: CIALIS Take 1 tablet (5 mg total) by mouth daily. May increase to 65m as needed        Allergies:  Allergies  Allergen Reactions   Allopurinol Diarrhea    Probenecid Other (See Comments) and Nausea Only    Other reaction(s): Headache    Family History: Family History  Problem Relation Age of Onset   Leukemia Mother     Social History:  reports that he quit smoking about 16 years ago. His smoking use included cigarettes. He has a 20.00 pack-year smoking history. He has never used smokeless tobacco. He reports that he does not drink alcohol and does not use drugs.  ROS: Pertinent ROS in HPI  Physical Exam: BP (!) 150/89   Pulse 81   Ht 5' 10" (1.778 m)   Wt 252 lb (114.3 kg)   BMI 36.16 kg/m   Constitutional:  Well nourished. Alert and oriented, No acute distress. HEENT: [place] AT, mask in place.  Trachea midline Cardiovascular: No clubbing, cyanosis, or edema. Respiratory: Normal respiratory effort, no increased work of breathing. GU: No CVA tenderness.  No bladder fullness or masses.  Patient with circumcised phallus.  Urethral meatus is patent.  No penile discharge. No penile lesions or rashes. Scrotum without lesions, cysts, rashes and/or edema.  Testicles are located scrotally bilaterally.  No masses are appreciated in the left testicle. Left epididymis are normal.  Could not palpate the right testicle or right epididymis.   Rectal: Patient with  normal sphincter tone. Anus and perineum without scarring or rashes. No rectal masses are appreciated. Prostate is approximately 60 + grams, could only palpated the apex and midportion of the gland, no nodules are appreciated. Seminal vesicles could not be palpated.   Neurologic: Grossly intact, no focal deficits, moving all 4 extremities. Psychiatric: Normal mood and affect.   Laboratory Data: WBC (White Blood Cell Count) 4.1 - 10.2 10^3/uL 10.9 High    RBC (Red Blood Cell Count) 4.69 - 6.13 10^6/uL 4.62 Low  Hemoglobin 14.1 - 18.1 gm/dL 12.5 Low    Hematocrit 40.0 - 52.0 % 41.9   MCV (Mean Corpuscular Volume) 80.0 - 100.0 fl 90.7   MCH (Mean Corpuscular Hemoglobin) 27.0 - 31.2 pg 27.1    MCHC (Mean Corpuscular Hemoglobin Concentration) 32.0 - 36.0 gm/dL 29.8 Low    Platelet Count 150 - 450 10^3/uL 192   RDW-CV (Red Cell Distribution Width) 11.6 - 14.8 % 15.6 High    MPV (Mean Platelet Volume) 9.4 - 12.4 fl 11.2   Neutrophils 1.50 - 7.80 10^3/uL 8.51 High    Lymphocytes 1.00 - 3.60 10^3/uL 1.10   Monocytes 0.00 - 1.50 10^3/uL 0.97   Eosinophils 0.00 - 0.55 10^3/uL 0.13   Basophils 0.00 - 0.09 10^3/uL 0.06   Neutrophil % 32.0 - 70.0 % 78.0 High    Lymphocyte % 10.0 - 50.0 % 10.1   Monocyte % 4.0 - 13.0 % 8.9   Eosinophil % 1.0 - 5.0 % 1.2   Basophil% 0.0 - 2.0 % 0.6   Immature Granulocyte % <=0.7 % 1.2 High    Immature Granulocyte Count <=0.06 10^3/L 0.13 High    Resulting Agency  [facility]  Specimen Collected: 02/09/21 08:26 Last Resulted: 02/09/21 11:35  Received From: [facility]  Result Received: 02/21/21 14:23   Creatinine 0.7 - 1.3 mg/dL 1.0   Glomerular Filtration Rate (eGFR), MDRD Estimate >60 mL/min/1.73sq m 92   Resulting Agency  [facility] - [facility]  Specimen Collected: 02/09/21 08:26 Last Resulted: 02/09/21 13:17  Received From: [facility]  Result Received: 02/21/21 14:23   Uric Acid 4.4 - 7.6 mg/dL 9.4 High    Resulting Agency  [facility] - [facility]  Specimen Collected: 02/09/21 08:26 Last Resulted: 02/09/21 13:17  Received From: [facility]  Result Received: 02/21/21 14:23   Urinalysis Component     Latest Ref Rng & Units 03/07/2021  Color, Urine     YELLOW YELLOW  Appearance     CLEAR CLEAR  Specific Gravity, Urine     1.005 - 1.030 1.020  pH     5.0 - 8.0 5.0  Glucose, UA     NEGATIVE mg/dL NEGATIVE  Hgb urine dipstick     NEGATIVE TRACE (A)  Bilirubin Urine     NEGATIVE NEGATIVE  Ketones, ur     NEGATIVE mg/dL NEGATIVE  Protein     NEGATIVE mg/dL NEGATIVE  Nitrite     NEGATIVE NEGATIVE  Leukocytes,Ua     NEGATIVE NEGATIVE  Squamous Epithelial /  LPF     0 - 5 0-5  WBC, UA     0 - 5 WBC/hpf 0-5  RBC / HPF     0 - 5 RBC/hpf 0-5  Bacteria, UA     NONE SEEN FEW (A)  I have reviewed the labs.     Pertinent Imaging: N/A   Assessment & Plan:    1. Prostate nodule  -not appreciated on this exam -no evidence for high grade carcinoma on 2022 prostate MRI  2. Erectile dysfunction -sildenafil 100 mg, on-demand-dosing - has not tried yet   3. BPH with LUTS -PSA pending -DRE benign  -UA benign  -PVR < 300 cc -symptoms - frequency  -continue conservative management, avoiding bladder irritants and timed voiding's -he will try to reduce his fluids instead of starting an OAB medications  4. Right scrotal swelling -could not palpate the internal contacts of the right hemiscrotum -scrotal  US pending   Follow up: Return in about 6 months (around 09/05/2021) for IPSS, SHIM, PSA and exam.  These notes generated with voice recognition software. I apologize for typographical errors.  Zara Council, PA-C  [facility] [address]  [place], [place] [address] [phone]

## 2021-03-07 ENCOUNTER — Other Ambulatory Visit: Payer: Self-pay

## 2021-03-07 ENCOUNTER — Other Ambulatory Visit
Admission: RE | Admit: 2021-03-07 | Discharge: 2021-03-07 | Disposition: A | Discharge status: Home / Self Care | Payer: Medicare HMO | Attending: Urology | Admitting: Urology

## 2021-03-07 ENCOUNTER — Ambulatory Visit: Payer: Medicare HMO | Admitting: Urology

## 2021-03-07 ENCOUNTER — Encounter: Payer: Self-pay | Admitting: Urology

## 2021-03-07 VITALS — BP 150/89 | HR 81 | Ht 70.0 in | Wt 252.0 lb

## 2021-03-07 DIAGNOSIS — N402 Nodular prostate without lower urinary tract symptoms: Secondary | ICD-10-CM

## 2021-03-07 DIAGNOSIS — N529 Male erectile dysfunction, unspecified: Secondary | ICD-10-CM | POA: Diagnosis present

## 2021-03-07 DIAGNOSIS — N138 Other obstructive and reflux uropathy: Secondary | ICD-10-CM | POA: Insufficient documentation

## 2021-03-07 DIAGNOSIS — N433 Hydrocele, unspecified: Secondary | ICD-10-CM | POA: Diagnosis not present

## 2021-03-07 DIAGNOSIS — N401 Enlarged prostate with lower urinary tract symptoms: Secondary | ICD-10-CM | POA: Insufficient documentation

## 2021-03-07 LAB — PSA: Prostatic Specific Antigen: 2.7 ng/mL (ref 0.00–4.00)

## 2021-03-07 LAB — URINALYSIS, COMPLETE (UACMP) WITH MICROSCOPIC
Bilirubin Urine: NEGATIVE
Glucose, UA: NEGATIVE mg/dL
Ketones, ur: NEGATIVE mg/dL
Leukocytes,Ua: NEGATIVE
Nitrite: NEGATIVE
Protein, ur: NEGATIVE mg/dL
Specific Gravity, Urine: 1.02 (ref 1.005–1.030)
pH: 5 (ref 5.0–8.0)

## 2021-03-08 ENCOUNTER — Inpatient Hospital Stay: Payer: Medicare HMO | Attending: Internal Medicine | Admitting: Internal Medicine

## 2021-03-08 ENCOUNTER — Other Ambulatory Visit: Payer: Self-pay | Admitting: *Deleted

## 2021-03-08 DIAGNOSIS — D591 Autoimmune hemolytic anemia, unspecified: Secondary | ICD-10-CM | POA: Diagnosis not present

## 2021-03-08 DIAGNOSIS — D594 Other nonautoimmune hemolytic anemias: Secondary | ICD-10-CM

## 2021-03-08 LAB — BASIC METABOLIC PANEL
Anion gap: 8 (ref 5–15)
BUN: 21 mg/dL (ref 8–23)
CO2: 26 mmol/L (ref 22–32)
Calcium: 9.6 mg/dL (ref 8.9–10.3)
Chloride: 106 mmol/L (ref 98–111)
Creatinine, Ser: 1 mg/dL (ref 0.61–1.24)
GFR, Estimated: >60 mL/min (ref >60)
Glucose, Bld: 108 mg/dL — ABNORMAL HIGH (ref 70–99)
Potassium: 3.8 mmol/L (ref 3.5–5.1)
Sodium: 140 mmol/L (ref 135–145)

## 2021-03-08 LAB — LACTATE DEHYDROGENASE: LDH: 154 U/L (ref 98–192)

## 2021-03-08 LAB — HEMOGLOBIN: Hemoglobin: 12.6 g/dL — ABNORMAL LOW (ref 13.0–17.0)

## 2021-03-08 LAB — HEMATOCRIT: HCT: 40.9 % (ref 39.0–52.0)

## 2021-03-11 ENCOUNTER — Other Ambulatory Visit: Payer: Self-pay | Admitting: Otolaryngology

## 2021-03-11 DIAGNOSIS — J329 Chronic sinusitis, unspecified: Secondary | ICD-10-CM

## 2021-03-16 ENCOUNTER — Ambulatory Visit
Admission: RE | Admit: 2021-03-16 | Discharge: 2021-03-16 | Disposition: A | Discharge status: Home / Self Care | Payer: Medicare HMO | Source: Ambulatory Visit | Attending: Otolaryngology | Admitting: Otolaryngology

## 2021-03-16 ENCOUNTER — Other Ambulatory Visit: Payer: Self-pay

## 2021-03-16 ENCOUNTER — Ambulatory Visit
Admission: RE | Admit: 2021-03-16 | Discharge: 2021-03-16 | Disposition: A | Discharge status: Home / Self Care | Payer: Medicare HMO | Source: Ambulatory Visit | Attending: Urology | Admitting: Urology

## 2021-03-16 DIAGNOSIS — N433 Hydrocele, unspecified: Secondary | ICD-10-CM | POA: Insufficient documentation

## 2021-03-16 DIAGNOSIS — J329 Chronic sinusitis, unspecified: Secondary | ICD-10-CM | POA: Diagnosis present

## 2021-03-17 ENCOUNTER — Other Ambulatory Visit: Payer: Self-pay | Admitting: Cardiovascular Disease

## 2021-03-17 NOTE — Telephone Encounter (Signed)
Prescription refill request for Eliquis received. Indication: PAF Last office visit: 10/13/20  C Burge PA-C Scr: 1.00 on 03/08/21 Age: 60 Weight: 112.7  Based on above findings Eliquis 5mg  twice daily is the appropriate dose.  Refill approved.

## 2021-03-22 ENCOUNTER — Encounter: Payer: Self-pay | Admitting: Physician Assistant

## 2021-03-22 ENCOUNTER — Other Ambulatory Visit: Payer: Self-pay

## 2021-03-22 ENCOUNTER — Ambulatory Visit: Payer: Medicare HMO | Admitting: Physician Assistant

## 2021-03-22 VITALS — BP 180/95 | HR 71 | Ht 70.0 in | Wt 251.0 lb

## 2021-03-22 DIAGNOSIS — J8410 Pulmonary fibrosis, unspecified: Secondary | ICD-10-CM

## 2021-03-22 DIAGNOSIS — E785 Hyperlipidemia, unspecified: Secondary | ICD-10-CM

## 2021-03-22 DIAGNOSIS — R000 Tachycardia, unspecified: Secondary | ICD-10-CM | POA: Diagnosis not present

## 2021-03-22 DIAGNOSIS — I4892 Unspecified atrial flutter: Secondary | ICD-10-CM

## 2021-03-22 DIAGNOSIS — I43 Cardiomyopathy in diseases classified elsewhere: Secondary | ICD-10-CM

## 2021-03-22 DIAGNOSIS — I2510 Atherosclerotic heart disease of native coronary artery without angina pectoris: Secondary | ICD-10-CM

## 2021-03-22 DIAGNOSIS — I5043 Acute on chronic combined systolic (congestive) and diastolic (congestive) heart failure: Secondary | ICD-10-CM

## 2021-03-22 DIAGNOSIS — Z5181 Encounter for therapeutic drug level monitoring: Secondary | ICD-10-CM

## 2021-03-22 DIAGNOSIS — I493 Ventricular premature depolarization: Secondary | ICD-10-CM

## 2021-03-22 MED ORDER — ROSUVASTATIN CALCIUM 5 MG PO TABS: 5.0000 mg | ORAL_TABLET | Freq: Every day | ORAL | 3 refills | Status: DC

## 2021-03-22 MED ORDER — TORSEMIDE 20 MG PO TABS: 20.0000 mg | ORAL_TABLET | Freq: Every day | ORAL | 3 refills | Status: DC

## 2021-03-22 NOTE — Progress Notes (Signed)
Office Visit    Patient Name: [name] Date of Encounter: 03/22/2021  PCP:  Griffin, Devin M, [place]  Cardiologist:  Devin Rogue, MD  Advanced Practice Provider:  No care team member to display Electrophysiologist:  None :[id number]}   Chief Complaint    No chief complaint on file.   60 y.o. male with a history of tachycardia induced cardiomyopathy in the setting of paroxysmal atrial flutter with subsequent improvement of LV function, hypertension, pulmonary sarcoidosis and fibrosis, discoid lupus, rheumatoid arthritis, GERD, anemia, and who presents today for follow-up of paroxysmal atrial fibrillation.  Past Medical History    Past Medical History:  Diagnosis Date   Anemia 10/2015   Discoid lupus    Gangrene of finger (HCC)    GERD (gastroesophageal reflux disease)    Gout    Hemolytic anemia ([place])    History of nuclear stress test    a. 09/02/2014: no sig ischemia, GI uptake noted, no sig WMA, EF 48%, no EKG chanes concerning for ischemia, low risk scan     HTN (hypertension)    Normal coronary arteries    a. cardiac cath 12/03/2014: no significant CAD, right dominant system, LVEF >55%, no MR or AS   Paroxysmal atrial flutter (HCC)    a. s/p successful TEE/DCCV on 08/28/2014-->was on xarelto, now on warfarin.   Pulmonary fibrosis (HCC)    a. not on home oxygen   Pulmonary sarcoidosis (HCC)    RA (rheumatoid arthritis) ([place])    Tachycardia-induced cardiomyopathy ([place])    a. echo 07/2014: EF 20-25%, cannot exclude atrial flutter,  global HK, possible bicuspid Ao valve, mod reduced RV sys fxn, mild-mod aortic valve sclerosis/calcification, mod TR, mildly elevated RVSP; b. TEE 08/28/2014: EF 30-35%, no intracardic thrombus, mildly dilated LA/RA, mild TR, mild-mod aortic sclerosis w/o stenosis; c. echo 09/2014: EF 55-60%, select images w/ inf HK; d. 10/2015 Echo: EF 60-65%   Past Surgical History:  Procedure Laterality Date    CARDIAC CATHETERIZATION N/A 12/03/2014   Procedure: Left Heart Cath and Coronary Angiography;  Surgeon: Devin Merritts, MD;  Location: [facility];  Service: Cardiovascular;  Laterality: N/A;   COLONOSCOPY WITH PROPOFOL N/A 11/23/2015   Procedure: COLONOSCOPY WITH PROPOFOL;  Surgeon: Devin Lame, MD;  Location: [facility];  Service: Endoscopy;  Laterality: N/A;   COLONOSCOPY WITH PROPOFOL N/A 05/04/2017   Procedure: COLONOSCOPY WITH PROPOFOL;  Surgeon: Devin Lame, MD;  Location: [place];  Service: Endoscopy;  Laterality: N/A;   ESOPHAGOGASTRODUODENOSCOPY (EGD) WITH PROPOFOL N/A 11/23/2015   Procedure: ESOPHAGOGASTRODUODENOSCOPY (EGD) WITH PROPOFOL;  Surgeon: Devin Lame, MD;  Location: [facility];  Service: Endoscopy;  Laterality: N/A;   HAND SURGERY Left    PERIPHERAL VASCULAR CATHETERIZATION N/A 11/17/2015   Procedure: Upper Extremity Angiography;  Surgeon: Devin Hampshire, MD;  Location: [facility];  Service: Cardiovascular;  Laterality: N/A;   POLYPECTOMY  05/04/2017   Procedure: POLYPECTOMY;  Surgeon: Devin Lame, MD;  Location: [place];  Service: Endoscopy;;    Allergies  Allergies  Allergen Reactions   Allopurinol Diarrhea   Probenecid Other (See Comments) and Nausea Only    Other reaction(s): Headache    History of Present Illness    [name] is a 60 y.o. male with PMH as above.  He has history of paroxysmal atrial flutter, tachycardia induced cardiomyopathy, hypertension, pulmonary sarcoidosis and fibrosis, discoid lupus, rheumatoid arthritis, GERD, and anemia.  He was  initially found to be in atrial flutter 01/2015, at which time his EF was 20 to 25%.  Ischemic evaluation showed no significant ischemia.  He subsequently underwent TEE and cardioversion 08/2014 and following cardioversion, EF improved to 60 to 65% 10/2015.  In 11/2014, he was admitted with chest pain and underwent diagnostic catheterization, which showed no  significant coronary artery disease.  In 12/2015, he was found to be in atrial flutter and rapid ventricular response.  He was started on amiodarone and converted to sinus rhythm.  Amiodarone was subsequently reduced and discontinued.  He has been chronically anticoagulated with warfarin and followed closely in our anticoagulation clinic.  He is also closely followed by pulmonology in the setting of sarcoidosis and pulmonary fibrosis and on home oxygen.  He was last seen in the office 10/13/2020, at which time he noted chronic dyspnea on exertion.  He was trying to be more active with goal of weight loss.  He had gotten up to 258 pounds earlier that year and was down 10 pounds since then.  He is watching what he is eating.  He was diagnosed with vertigo earlier in the year and treated with meclizine and antibiotics with subsequent improvement in his vertigo but was experiencing mild orthostasis.  Today, 03/22/2021, he returns to clinic and notes that he is overall doing well from a cardiac standpoint.  No chest pain reported.  He reports his breathing/dyspnea is stable in the setting of his pulmonary fibrosis.  His BP has remained elevated at home and today in clinic at 180/95.  He reports that he is starting to walk again with a goal of weight loss.  He reports it is occasionally hard to bend over to tie his shoes but not because of shortness of breath but rather because of BMI.  He reports some dependent edema that improves when he puts his legs up at night.  Zip up compression stockings discussed.  He still feels dizzy if he gets up too fast.  No signs or symptoms of bleeding.  He reports today that his PCP wants to start him on a statin and he is interested in which statin would be recommended with recommendation for Crestor if a statin is started, as this is most tolerated among the statins.  Diet and lifestyle reviewed.  Home Medications   Current Outpatient Medications  Medication Instructions    albuterol (VENTOLIN HFA) 108 (90 Base) MCG/ACT inhaler Inhalation, As needed   diltiazem (CARDIZEM CD) 120 mg, Oral, Daily   ELIQUIS 5 MG TABS tablet TAKE 1 TABLET TWICE DAILY   enalapril (VASOTEC) 20 MG tablet TAKE 1 TABLET EVERY DAY   fluticasone (FLONASE) 50 MCG/ACT nasal spray Nasal, As needed   fluticasone-salmeterol (ADVAIR) 250-50 MCG/ACT AEPB Inhalation, As needed   hydrochlorothiazide (HYDRODIURIL) 25 mg, Oral, Daily   ipratropium (ATROVENT) 0.03 % nasal spray Nasal, As needed   metoprolol tartrate (LOPRESSOR) 50 MG tablet TAKE 1 AND 1/2 TABLETS (75 MG) BY MOUTH 2 (TWO) TIMES DAILY.   Multiple Vitamin (MULTI-VITAMINS) TABS Oral   pantoprazole (PROTONIX) 40 mg, Oral, 2 times daily   potassium chloride SA (KLOR-CON) 20 MEQ tablet 20 mEq, Oral, Daily   predniSONE (DELTASONE) 10 mg, Oral, Daily with breakfast   sildenafil (VIAGRA) 100 mg, Oral, Daily PRN   tadalafil (CIALIS) 5 mg, Oral, Daily, May increase to 10mg  as needed     Review of Systems    He denies chest pain, palpitations, dyspnea, pnd, orthopnea, n, v, dizziness, syncope, edema,  weight gain, or early satiety.  He reports difficulty bending over but not due to shortness of breath.  He reports occasional dizziness with position changes.  He reports dependent edema that improves with elevation of his legs.  He reports elevated BP.  All other systems reviewed and are otherwise negative except as noted above.  Physical Exam    VS:  BP (!) 180/95 (BP Location: Left Arm, Patient Position: Sitting, Cuff Size: Normal)   Pulse 71   Ht 5\' 10"  (1.778 m)   Wt 251 lb (113.9 kg)   SpO2 92% Comment: 3 Liter of Oxygen  BMI 36.01 kg/m  , BMI Body mass index is 36.01 kg/m. GEN: Well nourished, well developed, in no acute distress. HEENT: normal. Neck: Supple, no JVD, carotid bruits, or masses. Cardiac: IOE,7/0 systolic murmur, rubs, or gallops. No clubbing, cyanosis, 1+LEE on the left side and and moderate edema on the right side.   Radials/DP/PT 2+ and equal bilaterally.  Respiratory:  Respirations regular and unlabored, clear to auscultation bilaterally. GI: firm distended, BS + x 4. MS: no deformity or atrophy. Skin: warm and dry, no rash. Neuro:  Strength and sensation are intact. Psych: Normal affect.  Accessory Clinical Findings    ECG personally reviewed by me today -sinus rhythm with PACs, incomplete RBBB, LAFB, LVH, repolarization abnormalities, cannot rule out anterior infarct- no acute changes.  VITALS Reviewed today   Temp Readings from Last 3 Encounters:  12/06/20 97.7 F (36.5 C)  06/08/20 97.9 F (36.6 C) (Tympanic)  11/25/19 98.2 F (36.8 C) (Tympanic)   BP Readings from Last 3 Encounters:  03/07/21 (!) 150/89  12/06/20 140/82  10/13/20 (!) 148/90   Pulse Readings from Last 3 Encounters:  03/07/21 81  12/06/20 92  10/13/20 69    Wt Readings from Last 3 Encounters:  03/07/21 252 lb (114.3 kg)  12/06/20 256 lb 9.6 oz (116.4 kg)  10/13/20 248 lb 8 oz (112.7 kg)     LABS  reviewed today   Lab Results  Component Value Date   WBC 12.9 (H) 12/06/2020   HGB 12.6 (L) 03/08/2021   HCT 40.9 03/08/2021   MCV 89.1 12/06/2020   PLT 208 12/06/2020   Lab Results  Component Value Date   CREATININE 1.00 03/08/2021   BUN 21 03/08/2021   NA 140 03/08/2021   K 3.8 03/08/2021   CL 106 03/08/2021   CO2 26 03/08/2021   Lab Results  Component Value Date   ALT 17 11/25/2019   AST 16 11/25/2019   ALKPHOS 45 11/25/2019   BILITOT 0.4 11/25/2019   Lab Results  Component Value Date   CHOL 88 08/25/2014   HDL 24 (L) 08/25/2014   LDLCALC 39 08/25/2014   TRIG 124 08/25/2014    No results found for: HGBA1C Lab Results  Component Value Date   TSH 2.273 08/25/2014     STUDIES/PROCEDURES reviewed today   Echo 11/22/2015 - Left ventricle: The cavity size was normal. Systolic function was    normal. The estimated ejection fraction was in the range of 60%    to 65%. Wall motion was  normal; there were no regional wall    motion abnormalities. Left ventricular diastolic function    parameters were normal.  - Left atrium: The atrium was at the upper limits of normal in    size.  - Right ventricle: Systolic function was normal.  - Pulmonary arteries: Systolic pressure was within the normal    range.  Impressions:  -  Challenging image quality. Frequent APCs and PVCs.   LHC 11/2014 Right dominant coronary arterial system No significant coronary artery disease Normal LV gram, ejection fraction estimated at greater than 55% No significant MR, aortic valve stenosis   Etiology of his chest pain is likely atypical in nature Shortness of breath likely secondary to underlying pulmonary fibrosis Medical management recommended Left Heart  Left Ventricle The left ventricular size is normal.  The left ventricular systolic function is normal.  The left ventricular ejection fraction is 55-65% by visual estimate.  There are no wall motion abnormalities in the left ventricle.   Images not recorded as flouro button was pressed, no cine EF estimated at the time of flouro. No AS or significant MR  Mitral Valve There is no mitral valve stenosis and no mitral valve regurgitation.  Aortic Valve There is no aortic valve stenosis. No calcification found in the aortic valve. There is normal aortic valve motion.  Aorta The size of the ascending aorta is normal.   Coronary Diagrams  Diagnostic Dominance: Right   Assessment & Plan    Paroxysmal atrial flutter - Denies tachypalpitations but does report ongoing intermittent dizziness.  Maintaining NSR today.  Amiodarone was discontinued in the setting of pulmonary fibrosis.  Continue current Cardizem and Lopressor for rate control.  Continue warfarin for anticoagulation.  He is followed closely by our anticoagulation clinic.  Tachycardia induced cardiomyopathy Acute on chronic systolic congestive heart failure --Denies significant  symptoms of volume overload, though patient volume overloaded on exam.  EF previously 20 to 25% 08/2014 in the setting of atrial flutter with improvement to 60 to 65% by echo 10/2015.  He reports stable dyspnea in the setting of pulmonary fibrosis.  He reports dependent edema, especially after sitting for long periods.  Edema and abdominal distention noted on exam today.  Given his recent labs / Cr, discussed recommendation for loop diuretic going forward.  Discontinue HCTZ.  Start torsemide 20 mg daily.  Obtain BMET today and in 1 to 2 weeks.  We discussed compression stockings, including the zip up stockings.  BP today significantly elevated with hopes this will improve with diuresis.  Continue to follow BPs at home.  Reviewed salt and fluid restrictions.  Recommend increase activity and heart healthy diet/weight loss with diet and exercise.  Essential hypertension with history of orthostatic lightheadedness --BP elevated today at 180/95.  Suspect BP elevated in the setting of volume overload.  We will discontinue HCTZ and start torsemide 20 mg daily as outlined above.  BMET today and at RTC.  Given his orthostatic symptoms, recommended slow position changes.  He is looking into zip up compression stockings.  His orthostatic vitals today were relatively stable with a drop from SBP 169 to SBP 153 when moving from lying to sitting with associated dizziness.  Within 3 minutes of standing, SBP 172.  Recommend close follow-up to reassess BP and volume status in 1 to 2 weeks.    Pulmonary fibrosis - Reports stable dyspnea.  Continue to follow with pulmonology.  Continue oxygen therapy.  HLD -- He reports his PCP recommended a statin with patient request for a recommendation and Crestor 5 mg daily started with recommendation for repeat lipid and liver function in 6 to 8 weeks.  He will wait to start the Crestor until 2 weeks after that of the torsemide, so that he is able to tell if he reacts poorly to 1 of these  medications.  Medication changes: Discontinue HCTZ.  Start torsemide  20 mg daily.  Start Crestor 5 mg daily per patient request/report of PCP request Labs ordered: BMET, BNP today and on RTC.  Repeat lipid and liver function in 6 to 8 weeks Studies / Imaging ordered: None Disposition: RTC 2 to 4 weeks  *Please be aware that the above documentation was completed voice recognition software and may contain dictation errors.      Arvil Chaco, PA-C 03/22/2021

## 2021-03-22 NOTE — Patient Instructions (Signed)
Medication Instructions:  Your physician has recommended you make the following change in your medication:   STOP Hydrochlorothiazide START Torsemide 20 mg once a day START in 2 weeks (November 8th) Crestor 5 mg once daily   *If you need a refill on your cardiac medications before your next appointment, please call your pharmacy*   Lab Work: BMET & BNP today  If you have labs (blood work) drawn today and your tests are completely normal, you will receive your results only by: [place] (if you have MyChart) OR A paper copy in the mail If you have any lab test that is abnormal or we need to change your treatment, we will call you to review the results.   Testing/Procedures: None   Follow-Up: At [facility], you and your health needs are our priority.  As part of our continuing mission to provide you with exceptional heart care, we have created designated Provider Care Teams.  These Care Teams include your primary Cardiologist (physician) and Advanced Practice Providers (APPs -  Physician Assistants and Nurse Practitioners) who all work together to provide you with the care you need, when you need it.   Your next appointment:   2 week(s)  The format for your next appointment:   In Person  Provider:   You may see Ida Rogue, MD or one of the following Advanced Practice Providers on your designated Care Team:   Murray Hodgkins, NP Christell Faith, PA-C Marrianne Mood, PA-C Cadence [place], [place]

## 2021-03-23 ENCOUNTER — Telehealth: Payer: Self-pay | Admitting: Cardiovascular Disease

## 2021-03-23 LAB — BASIC METABOLIC PANEL
BUN/Creatinine Ratio: 17 (ref 10–24)
BUN: 16 mg/dL (ref 8–27)
CO2: 26 mmol/L (ref 20–29)
Calcium: 9.6 mg/dL (ref 8.6–10.2)
Chloride: 105 mmol/L (ref 96–106)
Creatinine, Ser: 0.96 mg/dL (ref 0.76–1.27)
Glucose: 86 mg/dL (ref 70–99)
Potassium: 3.6 mmol/L (ref 3.5–5.2)
Sodium: 145 mmol/L — ABNORMAL HIGH (ref 134–144)
eGFR: 90 mL/min/{1.73_m2} (ref >59)

## 2021-03-23 LAB — BRAIN NATRIURETIC PEPTIDE: BNP: 45.5 pg/mL (ref 0.0–100.0)

## 2021-03-23 MED ORDER — ROSUVASTATIN CALCIUM 5 MG PO TABS: 5.0000 mg | ORAL_TABLET | Freq: Every day | ORAL | 1 refills | Status: DC

## 2021-03-23 MED ORDER — TORSEMIDE 20 MG PO TABS: 20.0000 mg | ORAL_TABLET | Freq: Every day | ORAL | 1 refills | Status: DC

## 2021-03-23 NOTE — Telephone Encounter (Signed)
Patient calling  Had appt 10/25 States he would like the torsemide and rosuvastatin prescriptions sent to [facility] in [place] instead of [place] [facility] pharmacy Please resend

## 2021-03-23 NOTE — Telephone Encounter (Signed)
Requested Prescriptions   Signed Prescriptions Disp Refills   rosuvastatin (CRESTOR) 5 MG tablet 30 tablet 1    Sig: Take 1 tablet (5 mg total) by mouth daily.    Authorizing Provider: Minna Merritts    Ordering User: Raelene Bott, Rylen Swindler L   torsemide (DEMADEX) 20 MG tablet 30 tablet 1    Sig: Take 1 tablet (20 mg total) by mouth daily.    Authorizing Provider: Minna Merritts    Ordering User: Raelene Bott, Hommer Cunliffe L

## 2021-03-24 ENCOUNTER — Encounter: Payer: Self-pay | Admitting: Physician Assistant

## 2021-03-25 ENCOUNTER — Telehealth: Payer: Self-pay | Admitting: *Deleted

## 2021-03-25 DIAGNOSIS — I5043 Acute on chronic combined systolic (congestive) and diastolic (congestive) heart failure: Secondary | ICD-10-CM

## 2021-03-25 MED ORDER — TORSEMIDE 20 MG PO TABS: 10.0000 mg | ORAL_TABLET | Freq: Every day | ORAL | Status: DC

## 2021-03-25 NOTE — Telephone Encounter (Signed)
-----   Message from Arvil Chaco, [place] sent at 03/24/2021  4:09 PM EDT ----- Attempted to call patient to reassess his symptoms and deliver the below results but unable to reach: Labs show his potassium is on the lower side Fluid lab is improved from previous labs.  --Decrease to torsemide 10mg  daily  --Continue current potassium supplementation --Repeat BMET within the next week to ensure electrolytes at goal.  Hopefully, he is tolerating the medication change well and feeling better.

## 2021-03-25 NOTE — Telephone Encounter (Signed)
Spoke with pt. Notified of lab results and provider's recc.  Pt voiced understanding. He will:  --Decrease to torsemide 10mg  daily (pt will cut current dose in half for 10 mg daily) --Continue current potassium supplementation --Lab appt scheduled in our office in 1 week 04/01/21  Pt also reports that he is feeling "much better" Reports 7 lb weight loss since starting Torsemide at [facility] 10/25.  Pt also reports incr of Eliquis cost from ~$35 to $400.  Gave pt Eliquis 360 assistance number: [phone] ([email or web address]-Eliquis) Advised pt contact our office if they are unable to help re cost.  Pt very appreciative and has no further questions at this time.

## 2021-04-01 ENCOUNTER — Other Ambulatory Visit: Payer: Self-pay

## 2021-04-01 ENCOUNTER — Telehealth: Payer: Self-pay

## 2021-04-01 ENCOUNTER — Other Ambulatory Visit (INDEPENDENT_AMBULATORY_CARE_PROVIDER_SITE_OTHER): Payer: Medicare HMO

## 2021-04-01 DIAGNOSIS — I5043 Acute on chronic combined systolic (congestive) and diastolic (congestive) heart failure: Secondary | ICD-10-CM

## 2021-04-01 NOTE — Telephone Encounter (Signed)
Able to reach back out to [name], he reports unable to afford Eliquis, cost went up and also in the donut hole. Cost around $400 for 90 day supply for Eliquis.   Pt called Eliquis, he is unable to apply for any assistance d/t combine income of him and wife making too much money.  Advised on pharmacy recommendations of two options The only 2 options would be to switch to warfarin- would need to come in for finger sticks for monitoring The second option would be to switch to xarelto and sign up for the jansen select program. But the cost would still be $85/month or 240/90 days  Pt reports has been on Xarelto and warfarin in the past, he would not like to "deal with the hassle of Xarelto", afraid in time cost would change $85 a month for 30-day supply or the $240 for 90-day supply adds up and can't really afford d/t other income obligations.  Pt stated he would like to go back on warfarin, understands the routine maintenance of INR checks, but it would greatly help in cost.  Advised pt I would send message to Dr. Rockey Situ and our coumadin nurse [facility] a message to see if we may proceed with switching over and getting first appt with coumadin nurse.   Pt has about 2 weeks left of Eliquis supply. Advised will reach back out once Dr. Rockey Situ approves and [place], RN can see pt for first appt. [name] thankful and will await call back with plan of care.

## 2021-04-01 NOTE — Telephone Encounter (Signed)
Patient states he is unable to afford Eliquis and would like to go back on warfarin. He states he is in the donut hole and does not qualify for patient assistance. He states he has about a 2 week supply of Eliquis left and would like to discuss this before he runs out.

## 2021-04-01 NOTE — Telephone Encounter (Signed)
I hate that - he was so excited to be able to come off of warfarin last year.   Once he's ok'd to switch, he can overlap w/ Eliquis and warfarin for 3 days and have me check him on a Wednesday on day 4 or 5. He was previously on warfarin 5 mg, so I'm assuming Dr. Rockey Situ will have him resume that dosage.  He can go on my schedule at any time slot - he does not need a new pt appt.

## 2021-04-01 NOTE — Telephone Encounter (Signed)
The only 2 options would be to switch to warfarin- would need to come in for finger sticks for monitoring The second option would be to switch to xarelto and sign up for the jansen select program. But the cost would still be $85/month or 240/90 days

## 2021-04-02 LAB — BASIC METABOLIC PANEL
BUN/Creatinine Ratio: 27 — ABNORMAL HIGH (ref 10–24)
BUN: 34 mg/dL — ABNORMAL HIGH (ref 8–27)
CO2: 26 mmol/L (ref 20–29)
Calcium: 10.2 mg/dL (ref 8.6–10.2)
Chloride: 98 mmol/L (ref 96–106)
Creatinine, Ser: 1.24 mg/dL (ref 0.76–1.27)
Glucose: 88 mg/dL (ref 70–99)
Potassium: 4 mmol/L (ref 3.5–5.2)
Sodium: 144 mmol/L (ref 134–144)
eGFR: 66 mL/min/{1.73_m2} (ref >59)

## 2021-04-04 MED ORDER — WARFARIN SODIUM 5 MG PO TABS: 5.0000 mg | ORAL_TABLET | Freq: Every day | ORAL | 0 refills | Status: DC

## 2021-04-04 NOTE — Telephone Encounter (Signed)
Pt is on my schedule for this Wednesday, but he has not switched to warfarin yet, so an INR would not be appropriate this week. Left message for pt to call me at [facility] location today so that I can give him instructions on switching from Eliquis to warfarin and to r/s his appt. Provided my direct # to reach me.

## 2021-04-04 NOTE — Telephone Encounter (Signed)
Spoke w/ pt.  He reports that he has 2-3 weeks worth of Eliquis left, but no warfarin (he flushed them all when he switched to Eliquis). He will count to see how many Eliquis he has and let me know so that we can determine a start date for warfarin, 3 day overlap and then f/u w/ me 4-5 days after. He is appreciative and will send me a MyChart message w/ that info.

## 2021-04-04 NOTE — Addendum Note (Signed)
Addended by: Dede Query R on: 04/04/2021 10:43 AM   Modules accepted: Orders

## 2021-04-05 ENCOUNTER — Telehealth: Payer: Self-pay | Admitting: *Deleted

## 2021-04-05 DIAGNOSIS — Z79899 Other long term (current) drug therapy: Secondary | ICD-10-CM

## 2021-04-05 NOTE — Telephone Encounter (Signed)
-----   Message from Theora Gianotti, NP sent at 04/05/2021 10:37 AM EST ----- Electrolytes are normal.  Kidneys appear a little bit more dry since being switched to torsemide (notes indicate he is currently on 10 mg daily).  Ensure adequate hydration and recommend following up a basic metabolic panel in 2 weeks to assess stability.

## 2021-04-05 NOTE — Telephone Encounter (Signed)
Notified pt of lab results and provider's recc below.  Pt voiced understanding.  2 week BMET lab appt scheduled 11/21 at 9 AM.  Pt has no further questions at this time.

## 2021-04-06 NOTE — Progress Notes (Signed)
Cardiology Office Note:    Date:  04/07/2021   ID:  [name], DOB 09/14/1960, MRN [id number]  PCP:  Vallery Sa  [facility] Cardiologist:  [place] Electrophysiologist:  None   Referring MD: Hughie Closs, PA-C   Chief Complaint: 2 week follow-up  History of Present Illness:    [name] is a 60 y.o. male with a hx of tachycardia induced cardiomyopathy in the setting of paroxysmal aflutter with subsequent improvement of LV function, HTN, pulmonary sarcoidosis and fibrosis, discoid lupus, rheumatoid arthritis, GERD, anemia, and who presents today for follow-up of paroxysmal atrial fibrillation who presents for follow-up.   He was initially found to be in atrial flutter 01/2015, at which time his EF was 20 to 25%.  Ischemic evaluation showed no significant ischemia.  He subsequently underwent TEE and cardioversion 08/2014 and following cardioversion, EF improved to 60 to 65% 10/2015.  In 11/2014, he was admitted with chest pain and underwent diagnostic catheterization, which showed no significant coronary artery disease.  In 12/2015, he was found to be in atrial flutter and rapid ventricular response.  He was started on amiodarone and converted to sinus rhythm.  Amiodarone was subsequently reduced and discontinued.  He has been chronically anticoagulated with warfarin and followed closely in our anticoagulation clinic.  He is also closely followed by pulmonology in the setting of sarcoidosis and pulmonary fibrosis and on home oxygen.  He was last seen in the office 10/13/2020, at which time he noted chronic dyspnea on exertion.  He was trying to be more active with goal of weight loss.  He had gotten up to 258 pounds earlier that year and was down 10 pounds since then.  He is watching what he is eating.  He was diagnosed with vertigo earlier in the year and treated with meclizine and antibiotics with subsequent improvement in his vertigo but  was experiencing mild orthostasis.   Seen 03/22/21 ad was doing well. BP was high. Noted to be volume up and started on Torsemide.   Today, the patient reports left leg swelling is better. Has been using compression socks. DOE is better. Also note BP has improved at home, generally/70. BP today mildly elevated, however patient has not had medications today. No chest pain, orthopnea, pnd, palpitations. He is transitioning to warfarin from Eliquis. Denies orthostatic symptoms.   Past Medical History:  Diagnosis Date   Anemia 10/2015   Discoid lupus    Gangrene of finger (HCC)    GERD (gastroesophageal reflux disease)    Gout    Hemolytic anemia ([place])    History of nuclear stress test    a. 09/02/2014: no sig ischemia, GI uptake noted, no sig WMA, EF 48%, no EKG chanes concerning for ischemia, low risk scan     HTN (hypertension)    Normal coronary arteries    a. cardiac cath 12/03/2014: no significant CAD, right dominant system, LVEF >55%, no MR or AS   Paroxysmal atrial flutter (HCC)    a. s/p successful TEE/DCCV on 08/28/2014-->was on xarelto, now on warfarin.   Pulmonary fibrosis ([place])    a. not on home oxygen   Pulmonary sarcoidosis (HCC)    RA (rheumatoid arthritis) ([place])    Tachycardia-induced cardiomyopathy ([place])    a. echo 07/2014: EF 20-25%, cannot exclude atrial flutter,  global HK, possible bicuspid Ao valve, mod reduced RV sys fxn, mild-mod aortic valve sclerosis/calcification, mod TR, mildly elevated RVSP; b. TEE 08/28/2014: EF 30-35%, no  intracardic thrombus, mildly dilated LA/RA, mild TR, mild-mod aortic sclerosis w/o stenosis; c. echo 09/2014: EF 55-60%, select images w/ inf HK; d. 10/2015 Echo: EF 60-65%    Past Surgical History:  Procedure Laterality Date   CARDIAC CATHETERIZATION N/A 12/03/2014   Procedure: Left Heart Cath and Coronary Angiography;  Surgeon: Minna Merritts, MD;  Location: [place] [facility];  Service: Cardiovascular;  Laterality: N/A;   COLONOSCOPY WITH  PROPOFOL N/A 11/23/2015   Procedure: COLONOSCOPY WITH PROPOFOL;  Surgeon: [name], MD;  Location: [facility];  Service: Endoscopy;  Laterality: N/A;   COLONOSCOPY WITH PROPOFOL N/A 05/04/2017   Procedure: COLONOSCOPY WITH PROPOFOL;  Surgeon: [name], MD;  Location: [place];  Service: Endoscopy;  Laterality: N/A;   ESOPHAGOGASTRODUODENOSCOPY (EGD) WITH PROPOFOL N/A 11/23/2015   Procedure: ESOPHAGOGASTRODUODENOSCOPY (EGD) WITH PROPOFOL;  Surgeon: [name], MD;  Location: [facility] END[facility];  Service: Endoscopy;  Laterality: N/A;   HAND SURGERY Left    PERIPHERAL VASCULAR CATHETERIZATION N/A 11/17/2015   Procedure: Upper Extremity Angiography;  Surgeon: Wellington Hampshire, MD;  Location: [place] [facility];  Service: Cardiovascular;  Laterality: N/A;   POLYPECTOMY  05/04/2017   Procedure: POLYPECTOMY;  Surgeon: [name], MD;  Location: [facility];  Service: Endoscopy;;    Current Medications: Current Meds  Medication Sig   albuterol (VENTOLIN HFA) 108 (90 Base) MCG/ACT inhaler Inhale into the lungs as needed.   diltiazem (CARDIZEM CD) 120 MG 24 hr capsule Take 1 capsule (120 mg total) by mouth daily.   ELIQUIS 5 MG TABS tablet TAKE 1 TABLET TWICE DAILY   enalapril (VASOTEC) 20 MG tablet TAKE 1 TABLET EVERY DAY   fluticasone (FLONASE) 50 MCG/ACT nasal spray Place into the nose as needed.   fluticasone-salmeterol (ADVAIR) 250-50 MCG/ACT AEPB Inhale into the lungs as needed.   ipratropium (ATROVENT) 0.03 % nasal spray Place into the nose as needed.   metoprolol tartrate (LOPRESSOR) 50 MG tablet TAKE 1 AND 1/2 TABLETS (75 MG) BY MOUTH 2 (TWO) TIMES DAILY.   Multiple Vitamin (MULTI-VITAMINS) TABS Take by mouth.   mycophenolate (CELLCEPT) 500 MG tablet Take 2 tablets by mouth 2 (two) times daily.   pantoprazole (PROTONIX) 40 MG tablet Take 40 mg by mouth 2 (two) times daily.   potassium chloride SA (KLOR-CON) 20 MEQ tablet Take 1 tablet (20 mEq total) by mouth daily.    predniSONE (DELTASONE) 5 MG tablet Take 10 mg by mouth daily with breakfast.   rosuvastatin (CRESTOR) 5 MG tablet Take 1 tablet (5 mg total) by mouth daily.   sildenafil (VIAGRA) 100 MG tablet Take 1 tablet (100 mg total) by mouth daily as needed for erectile dysfunction.   tadalafil (CIALIS) 5 MG tablet Take 1 tablet (5 mg total) by mouth daily. May increase to 10mg  as needed   torsemide (DEMADEX) 20 MG tablet Take 0.5 tablets (10 mg total) by mouth daily.     Allergies:   Allopurinol and Probenecid   Social History   Socioeconomic History   Marital status: Married    Spouse name: Not on file   Number of children: Not on file   Years of education: Not on file   Highest education level: Not on file  Occupational History   Not on file  Tobacco Use   Smoking status: Former    Packs/day: 1.00    Years: 20.00    Pack years: 20.00    Types: Cigarettes    Quit date: 08/30/2004    Years  since quitting: 16.6   Smokeless tobacco: Never  Vaping Use   Vaping Use: Never used  Substance and Sexual Activity   Alcohol use: No   Drug use: No   Sexual activity: Yes    Birth control/protection: None  Other Topics Concern   Not on file  Social History Narrative   Not on file   Social Determinants of Health   Financial Resource Strain: Not on file  Food Insecurity: Not on file  Transportation Needs: Not on file  Physical Activity: Not on file  Stress: Not on file  Social Connections: Not on file     Family History: The patient's family history includes Leukemia in his mother.  ROS:   Please see the history of present illness.     All other systems reviewed and are negative.  EKGs/Labs/Other Studies Reviewed:    The following studies were reviewed today:  Echo 2021 INTERPRETATION  NORMAL LEFT VENTRICULAR SYSTOLIC FUNCTION  NORMAL RIGHT VENTRICULAR SYSTOLIC FUNCTION  MILD VALVULAR REGURGITATION (See above)  NO VALVULAR STENOSIS  TRIVIAL MR, TR  MILD PR  EF 55-60%    EKG:  EKG is  ordered today.  The ekg ordered today demonstrates NSR, 73bpm, PVC/PAC, IVCD, no acute changes.   Recent Labs: 12/06/2020: Platelets 208 03/08/2021: Hemoglobin 12.6 03/22/2021: BNP 45.5 04/01/2021: BUN 34; Creatinine, Ser 1.24; Potassium 4.0; Sodium 144  Recent Lipid Panel    Component Value Date/Time   CHOL 88 08/25/2014 0110   TRIG 124 08/25/2014 0110   HDL 24 (L) 08/25/2014 0110   VLDL 25 08/25/2014 0110   LDLCALC 39 08/25/2014 0110    Physical Exam:    VS:  BP 140/88 (BP Location: Left Arm, Patient Position: Sitting, Cuff Size: Normal)   Pulse 73   Ht 5\' 10"  (1.778 m)   Wt 246 lb 8 oz (111.8 kg)   BMI 35.37 kg/m     Wt Readings from Last 3 Encounters:  04/07/21 246 lb 8 oz (111.8 kg)  03/22/21 251 lb (113.9 kg)  03/07/21 252 lb (114.3 kg)     GEN:  Well nourished, well developed in no acute distress HEENT: Normal NECK: No JVD; No carotid bruits LYMPHATICS: No lymphadenopathy CARDIAC: RRR, no murmurs, rubs, gallops RESPIRATORY:  Clear to auscultation without rales, wheezing or rhonchi  ABDOMEN: Soft, non-tender, non-distended MUSCULOSKELETAL:  No edema; No deformity  SKIN: Warm and dry NEUROLOGIC:  Alert and oriented x 3 PSYCHIATRIC:  Normal affect   ASSESSMENT:    1. Tachycardia-induced cardiomyopathy (HCC)   2. Paroxysmal atrial flutter ([place])   3. Pulmonary fibrosis ([place])   4. Hyperlipidemia LDL goal <70   5. Orthostatic lightheadedness   6. Chronic systolic heart failure (HCC)    PLAN:    In order of problems listed above:  Paroxysmal aflutter EKG shows NSR, heart rate 73bpm. He is transitioning from Elqiuis to warfarin.  Continue rate control with Cardizem and Lopressor.   HTN BP today mildly elevated but has not had medicaitons today. He is on lisinopril, cardizem, torsemide. Reports improved BP at home, 120s/70s.  Tachycardia induced CM HFrEF Most recent echo showed LVEF 55-60% (echo at [facility]). Patient reports improvement of  lower leg swelling and shortness of breath on Torsemide 10 mg daily. Follow-up labs showed dehydration. Will re-check BMET today. Discussed CHF education in detail. Continue BB and enalapril.If labs show persistent dehydration, consider Torsemide every other day.   Pulmonary fibrosis Reports improved breathing on torsemide. Followed by pulmonology.   HLD Hasn't  started Crestor, recommend to start 5mg  daily. Re-check lipid panel/LFTs at follow-up.    Disposition: Follow up in 3 month(s) with MD/APP      Signed, Dionte Blaustein Ninfa Meeker, PA-C  04/07/2021 9:32 AM    [facility]

## 2021-04-07 ENCOUNTER — Encounter: Payer: Self-pay | Admitting: Medical

## 2021-04-07 ENCOUNTER — Other Ambulatory Visit: Payer: Self-pay

## 2021-04-07 ENCOUNTER — Ambulatory Visit: Payer: Medicare HMO | Admitting: Medical

## 2021-04-07 VITALS — BP 140/88 | HR 73 | Ht 70.0 in | Wt 246.5 lb

## 2021-04-07 DIAGNOSIS — R42 Dizziness and giddiness: Secondary | ICD-10-CM

## 2021-04-07 DIAGNOSIS — E785 Hyperlipidemia, unspecified: Secondary | ICD-10-CM

## 2021-04-07 DIAGNOSIS — I5022 Chronic systolic (congestive) heart failure: Secondary | ICD-10-CM

## 2021-04-07 DIAGNOSIS — I428 Other cardiomyopathies: Secondary | ICD-10-CM

## 2021-04-07 DIAGNOSIS — R000 Tachycardia, unspecified: Secondary | ICD-10-CM

## 2021-04-07 DIAGNOSIS — J8410 Pulmonary fibrosis, unspecified: Secondary | ICD-10-CM

## 2021-04-07 DIAGNOSIS — I4892 Unspecified atrial flutter: Secondary | ICD-10-CM

## 2021-04-07 DIAGNOSIS — I43 Cardiomyopathy in diseases classified elsewhere: Secondary | ICD-10-CM

## 2021-04-07 NOTE — Patient Instructions (Addendum)
Medication Instructions:  No changes at this time.  *If you need a refill on your cardiac medications before your next appointment, please call your pharmacy*   Lab Work: BMET today  Fasting Lipid & Liver panel needed prior to next appointment. Make sure not to eat or drink anything after midnight except sip of water with your medications.   If you have labs (blood work) drawn today and your tests are completely normal, you will receive your results only by: [place] (if you have MyChart) OR A paper copy in the mail If you have any lab test that is abnormal or we need to change your treatment, we will call you to review the results.   Testing/Procedures: None   Follow-Up: At [facility], you and your health needs are our priority.  As part of our continuing mission to provide you with exceptional heart care, we have created designated Provider Care Teams.  These Care Teams include your primary Cardiologist (physician) and Advanced Practice Providers (APPs -  Physician Assistants and Nurse Practitioners) who all work together to provide you with the care you need, when you need it.   Your next appointment:   3 month(s)  The format for your next appointment:   In Person  Provider:   Ida Rogue, MD or Cadence Kathlen Mody, [place]

## 2021-04-08 LAB — BASIC METABOLIC PANEL
BUN/Creatinine Ratio: 21 (ref 10–24)
BUN: 22 mg/dL (ref 8–27)
CO2: 26 mmol/L (ref 20–29)
Calcium: 9.6 mg/dL (ref 8.6–10.2)
Chloride: 103 mmol/L (ref 96–106)
Creatinine, Ser: 1.03 mg/dL (ref 0.76–1.27)
Glucose: 80 mg/dL (ref 70–99)
Potassium: 4.1 mmol/L (ref 3.5–5.2)
Sodium: 143 mmol/L (ref 134–144)
eGFR: 83 mL/min/{1.73_m2} (ref >59)

## 2021-04-14 ENCOUNTER — Other Ambulatory Visit: Payer: Self-pay | Admitting: Cardiovascular Disease

## 2021-04-14 NOTE — Telephone Encounter (Signed)
Patient calling to check on status  Has not taken the medication yet today and has felt a little better

## 2021-04-14 NOTE — Telephone Encounter (Signed)
Per mychart message - 11/16  [name] to P [facility] Triage (supporting Furth, Cadence H, PA-C)    Since I've been taking Rosuvastatin I'm experiencing side affects that are causing headaches,  stomach cramps,  bloating, bad diarrhea, muscle soreness/weakness.  [name].   Will wait to refill until mychart message is responded too.

## 2021-04-18 ENCOUNTER — Other Ambulatory Visit: Payer: Self-pay

## 2021-04-18 ENCOUNTER — Other Ambulatory Visit (INDEPENDENT_AMBULATORY_CARE_PROVIDER_SITE_OTHER): Payer: Medicare HMO

## 2021-04-18 DIAGNOSIS — J8410 Pulmonary fibrosis, unspecified: Secondary | ICD-10-CM

## 2021-04-18 DIAGNOSIS — E785 Hyperlipidemia, unspecified: Secondary | ICD-10-CM

## 2021-04-18 DIAGNOSIS — I4892 Unspecified atrial flutter: Secondary | ICD-10-CM

## 2021-04-18 DIAGNOSIS — R42 Dizziness and giddiness: Secondary | ICD-10-CM

## 2021-04-18 DIAGNOSIS — Z79899 Other long term (current) drug therapy: Secondary | ICD-10-CM

## 2021-04-18 DIAGNOSIS — I5022 Chronic systolic (congestive) heart failure: Secondary | ICD-10-CM

## 2021-04-18 DIAGNOSIS — R000 Tachycardia, unspecified: Secondary | ICD-10-CM

## 2021-04-19 LAB — HEPATIC FUNCTION PANEL
ALT: 16 IU/L (ref 0–44)
AST: 14 IU/L (ref 0–40)
Albumin: 4.1 g/dL (ref 3.8–4.8)
Alkaline Phosphatase: 53 IU/L (ref 44–121)
Bilirubin Total: 0.3 mg/dL (ref 0.0–1.2)
Bilirubin, Direct: 0.11 mg/dL (ref 0.00–0.40)
Total Protein: 6.6 g/dL (ref 6.0–8.5)

## 2021-04-19 LAB — LIPID PANEL
Chol/HDL Ratio: 3.5 ratio (ref 0.0–5.0)
Cholesterol, Total: 173 mg/dL (ref 100–199)
HDL: 49 mg/dL (ref >39)
LDL Chol Calc (NIH): 92 mg/dL (ref 0–99)
Triglycerides: 187 mg/dL — ABNORMAL HIGH (ref 0–149)
VLDL Cholesterol Cal: 32 mg/dL (ref 5–40)

## 2021-04-25 ENCOUNTER — Encounter: Payer: Self-pay | Admitting: Cardiovascular Disease

## 2021-04-26 ENCOUNTER — Other Ambulatory Visit: Payer: Medicare HMO

## 2021-04-26 ENCOUNTER — Other Ambulatory Visit: Payer: Self-pay | Admitting: Family Medicine

## 2021-04-26 ENCOUNTER — Other Ambulatory Visit: Payer: Self-pay

## 2021-04-26 DIAGNOSIS — Z136 Encounter for screening for cardiovascular disorders: Secondary | ICD-10-CM

## 2021-04-26 MED ORDER — WARFARIN SODIUM 5 MG PO TABS: 5.0000 mg | ORAL_TABLET | Freq: Every day | ORAL | 0 refills | Status: DC

## 2021-04-26 NOTE — Progress Notes (Signed)
[name], [place], [place]  You; [name], RN; [facility]; Minna Merritts, MD 2 hours ago (10:45 AM)   Recommend patient begin taking warfarin 5 mg daily starting on 04/28/21 along with his Eliquis.  Patient can then be seen for INR check on 05/04/21.  Do not recommend patient coming tomorrow for INR check as he has not started warfarin yet and also tested positive for Covid yesterday

## 2021-04-26 NOTE — Telephone Encounter (Signed)
Can you please advise the mychart message below.   Thank you!

## 2021-05-04 ENCOUNTER — Other Ambulatory Visit: Payer: Self-pay

## 2021-05-04 ENCOUNTER — Ambulatory Visit (INDEPENDENT_AMBULATORY_CARE_PROVIDER_SITE_OTHER): Payer: Medicare HMO

## 2021-05-04 DIAGNOSIS — I4892 Unspecified atrial flutter: Secondary | ICD-10-CM

## 2021-05-04 DIAGNOSIS — Z5181 Encounter for therapeutic drug level monitoring: Secondary | ICD-10-CM

## 2021-05-04 LAB — POCT INR: INR: 1.1 — AB (ref 2.0–3.0)

## 2021-05-04 NOTE — Patient Instructions (Signed)
-   CONTINUE YOUR ELIQUIS 5 MG TWICE A DAY UNTIL YOU RUN OUT - Start taking warfarin 5 mg every day  - Recheck INR next week

## 2021-05-10 ENCOUNTER — Other Ambulatory Visit: Payer: Self-pay

## 2021-05-10 ENCOUNTER — Ambulatory Visit
Admission: RE | Admit: 2021-05-10 | Discharge: 2021-05-10 | Disposition: A | Discharge status: Home / Self Care | Payer: Medicare HMO | Source: Ambulatory Visit | Attending: Family Medicine | Admitting: Family Medicine

## 2021-05-10 DIAGNOSIS — Z136 Encounter for screening for cardiovascular disorders: Secondary | ICD-10-CM | POA: Insufficient documentation

## 2021-05-11 ENCOUNTER — Ambulatory Visit (INDEPENDENT_AMBULATORY_CARE_PROVIDER_SITE_OTHER): Payer: Medicare HMO

## 2021-05-11 DIAGNOSIS — I4892 Unspecified atrial flutter: Secondary | ICD-10-CM | POA: Diagnosis not present

## 2021-05-11 DIAGNOSIS — Z5181 Encounter for therapeutic drug level monitoring: Secondary | ICD-10-CM

## 2021-05-11 LAB — POCT INR: INR: 3.2 — AB (ref 2.0–3.0)

## 2021-05-11 NOTE — Patient Instructions (Signed)
-   skip warfarin tonight, then - resume previous dosage of warfarin 1 tablet every day EXCEPT 1/2 TABLET ON MONDAYS & FRIDAYS - recheck in 3 weeks

## 2021-06-01 ENCOUNTER — Other Ambulatory Visit: Payer: Self-pay

## 2021-06-01 ENCOUNTER — Other Ambulatory Visit: Payer: Self-pay | Admitting: Cardiovascular Disease

## 2021-06-01 ENCOUNTER — Ambulatory Visit (INDEPENDENT_AMBULATORY_CARE_PROVIDER_SITE_OTHER): Payer: Medicare HMO

## 2021-06-01 DIAGNOSIS — Z5181 Encounter for therapeutic drug level monitoring: Secondary | ICD-10-CM

## 2021-06-01 DIAGNOSIS — I4892 Unspecified atrial flutter: Secondary | ICD-10-CM

## 2021-06-01 LAB — POCT INR: INR: 3.9 — AB (ref 2.0–3.0)

## 2021-06-01 NOTE — Patient Instructions (Signed)
-   skip warfarin tonight & tomorrow since you're still on prednisone - on Friday, resume dosage of warfarin 1 tablet every day EXCEPT 1/2 TABLET ON MONDAYS & FRIDAYS - recheck in 3 weeks [facility] [phone] - call if you are put on any antibiotics or steroids

## 2021-06-07 ENCOUNTER — Other Ambulatory Visit: Payer: Self-pay

## 2021-06-07 ENCOUNTER — Inpatient Hospital Stay: Payer: Medicare HMO | Attending: Internal Medicine

## 2021-06-07 ENCOUNTER — Encounter: Payer: Self-pay | Admitting: Internal Medicine

## 2021-06-07 ENCOUNTER — Inpatient Hospital Stay: Payer: Medicare HMO | Admitting: Internal Medicine

## 2021-06-07 DIAGNOSIS — R5383 Other fatigue: Secondary | ICD-10-CM | POA: Insufficient documentation

## 2021-06-07 DIAGNOSIS — I10 Essential (primary) hypertension: Secondary | ICD-10-CM | POA: Insufficient documentation

## 2021-06-07 DIAGNOSIS — L930 Discoid lupus erythematosus: Secondary | ICD-10-CM | POA: Diagnosis not present

## 2021-06-07 DIAGNOSIS — D594 Other nonautoimmune hemolytic anemias: Secondary | ICD-10-CM

## 2021-06-07 DIAGNOSIS — M549 Dorsalgia, unspecified: Secondary | ICD-10-CM | POA: Diagnosis not present

## 2021-06-07 DIAGNOSIS — D591 Autoimmune hemolytic anemia, unspecified: Secondary | ICD-10-CM | POA: Diagnosis present

## 2021-06-07 DIAGNOSIS — Z6836 Body mass index (BMI) 36.0-36.9, adult: Secondary | ICD-10-CM | POA: Insufficient documentation

## 2021-06-07 DIAGNOSIS — Z7901 Long term (current) use of anticoagulants: Secondary | ICD-10-CM | POA: Diagnosis not present

## 2021-06-07 DIAGNOSIS — Z8679 Personal history of other diseases of the circulatory system: Secondary | ICD-10-CM | POA: Diagnosis not present

## 2021-06-07 DIAGNOSIS — T380X5A Adverse effect of glucocorticoids and synthetic analogues, initial encounter: Secondary | ICD-10-CM | POA: Diagnosis not present

## 2021-06-07 DIAGNOSIS — Z79899 Other long term (current) drug therapy: Secondary | ICD-10-CM | POA: Insufficient documentation

## 2021-06-07 DIAGNOSIS — R635 Abnormal weight gain: Secondary | ICD-10-CM | POA: Diagnosis not present

## 2021-06-07 DIAGNOSIS — M069 Rheumatoid arthritis, unspecified: Secondary | ICD-10-CM | POA: Insufficient documentation

## 2021-06-07 DIAGNOSIS — M2550 Pain in unspecified joint: Secondary | ICD-10-CM | POA: Diagnosis not present

## 2021-06-07 DIAGNOSIS — Z7964 Long term (current) use of myelosuppressive agent: Secondary | ICD-10-CM | POA: Insufficient documentation

## 2021-06-07 DIAGNOSIS — J8410 Pulmonary fibrosis, unspecified: Secondary | ICD-10-CM | POA: Insufficient documentation

## 2021-06-07 LAB — FERRITIN: Ferritin: 27 ng/mL (ref 24–336)

## 2021-06-07 LAB — CBC WITH DIFFERENTIAL/PLATELET
Abs Immature Granulocytes: 0.22 10*3/uL — ABNORMAL HIGH (ref 0.00–0.07)
Basophils Absolute: 0.1 10*3/uL (ref 0.0–0.1)
Basophils Relative: 1 %
Eosinophils Absolute: 0.1 10*3/uL (ref 0.0–0.5)
Eosinophils Relative: 1 %
HCT: 40.2 % (ref 39.0–52.0)
Hemoglobin: 12.3 g/dL — ABNORMAL LOW (ref 13.0–17.0)
Immature Granulocytes: 2 %
Lymphocytes Relative: 11 %
Lymphs Abs: 1.2 10*3/uL (ref 0.7–4.0)
MCH: 27.6 pg (ref 26.0–34.0)
MCHC: 30.6 g/dL (ref 30.0–36.0)
MCV: 90.1 fL (ref 80.0–100.0)
Monocytes Absolute: 0.8 10*3/uL (ref 0.1–1.0)
Monocytes Relative: 8 %
Neutro Abs: 8.3 10*3/uL — ABNORMAL HIGH (ref 1.7–7.7)
Neutrophils Relative %: 77 %
Platelets: 186 10*3/uL (ref 150–400)
RBC: 4.46 MIL/uL (ref 4.22–5.81)
RDW: 15.7 % — ABNORMAL HIGH (ref 11.5–15.5)
WBC: 10.7 10*3/uL — ABNORMAL HIGH (ref 4.0–10.5)
nRBC: 0 % (ref 0.0–0.2)

## 2021-06-07 LAB — BASIC METABOLIC PANEL
Anion gap: 6 (ref 5–15)
BUN: 28 mg/dL — ABNORMAL HIGH (ref 8–23)
CO2: 27 mmol/L (ref 22–32)
Calcium: 9 mg/dL (ref 8.9–10.3)
Chloride: 104 mmol/L (ref 98–111)
Creatinine, Ser: 1.05 mg/dL (ref 0.61–1.24)
GFR, Estimated: >60 mL/min (ref >60)
Glucose, Bld: 85 mg/dL (ref 70–99)
Potassium: 3.9 mmol/L (ref 3.5–5.1)
Sodium: 137 mmol/L (ref 135–145)

## 2021-06-07 LAB — IRON AND TIBC
Iron: 63 ug/dL (ref 45–182)
Saturation Ratios: 21 % (ref 17.9–39.5)
TIBC: 307 ug/dL (ref 250–450)
UIBC: 244 ug/dL

## 2021-06-07 LAB — LACTATE DEHYDROGENASE: LDH: 163 U/L (ref 98–192)

## 2021-06-07 NOTE — Progress Notes (Signed)
[place] OFFICE PROGRESS NOTE  Patient Care Team: Vallery Sa as PCP - General (Physician Assistant) Minna Merritts, MD as Consulting Physician (Cardiology)   Oncology History   No history exists.   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  Ambulating independently.  [name] 61 y.o.  male pleasant patient above history of Autoimmune hemolytic anemia- currently on prednisone 10 mg/CellCept 500 twice a day [also for Autoimmune/connective tissue disorder]- is here for follow-up.   In the interim had a flareup of his rheumatoid arthritis needing steroid burst.  Patient denies any worsening shortness of breath.  Chronic mild fatigue.  Continues to be concerned about weight gain.   Review of Systems  Constitutional:  Negative for chills, diaphoresis, fever, malaise/fatigue and weight loss.  HENT:  Negative for nosebleeds and sore throat.   Eyes:  Negative for double vision.  Respiratory:  Negative for cough, hemoptysis, sputum production, shortness of breath and wheezing.   Cardiovascular:  Negative for chest pain, palpitations, orthopnea and leg swelling.  Gastrointestinal:  Negative for abdominal pain, blood in stool, constipation, diarrhea, heartburn, melena, nausea and vomiting.  Genitourinary:  Negative for dysuria, frequency and urgency.  Musculoskeletal:  Positive for back pain and joint pain.  Skin: Negative.  Negative for itching and rash.  Neurological:  Negative for dizziness, tingling, focal weakness, weakness and headaches.  Endo/Heme/Allergies:  Does not bruise/bleed easily.   Psychiatric/Behavioral:  Negative for depression. The patient is not nervous/anxious and does not have insomnia.     PAST MEDICAL HISTORY :  Past Medical History:  Diagnosis Date   Anemia 10/2015   Discoid lupus    Gangrene of finger (HCC)    GERD (gastroesophageal reflux disease)    Gout    Hemolytic anemia (HCC)    History of nuclear stress test    a. 09/02/2014: no sig ischemia, GI uptake noted, no sig WMA, EF 48%, no EKG chanes concerning for ischemia, low risk scan     HTN (hypertension)    Normal coronary arteries    a. cardiac cath 12/03/2014: no significant CAD, right dominant system, LVEF >55%, no MR or AS   Paroxysmal atrial flutter (HCC)    a. s/p successful TEE/DCCV on 08/28/2014-->was on xarelto, now on warfarin.   Pulmonary fibrosis (HCC)    a. not on home oxygen   Pulmonary sarcoidosis (HCC)    RA (rheumatoid arthritis) ([place])    Tachycardia-induced cardiomyopathy ([place])    a. echo 07/2014: EF 20-25%, cannot exclude atrial flutter,  global HK, possible bicuspid Ao valve, mod reduced RV sys fxn, mild-mod aortic valve sclerosis/calcification, mod TR, mildly elevated RVSP; b. TEE 08/28/2014: EF 30-35%, no intracardic thrombus, mildly dilated LA/RA, mild TR, mild-mod aortic sclerosis w/o stenosis; c. echo 09/2014: EF 55-60%, select images w/ inf HK; d. 10/2015 Echo: EF 60-65%    PAST SURGICAL HISTORY :   Past Surgical History:  Procedure Laterality Date   CARDIAC CATHETERIZATION N/A 12/03/2014   Procedure: Left Heart Cath and Coronary Angiography;  Surgeon: Minna Merritts, MD;  Location: [place] [facility];  Service: Cardiovascular;  Laterality: N/A;   COLONOSCOPY WITH PROPOFOL N/A 11/23/2015   Procedure: COLONOSCOPY WITH PROPOFOL;  Surgeon:  [name], MD;  Location: [facility];  Service: Endoscopy;  Laterality: N/A;   COLONOSCOPY WITH PROPOFOL N/A 05/04/2017   Procedure: COLONOSCOPY WITH PROPOFOL;  Surgeon: [name], MD;  Location: [place];  Service:  Endoscopy;  Laterality: N/A;   ESOPHAGOGASTRODUODENOSCOPY (EGD) WITH PROPOFOL N/A 11/23/2015   Procedure: ESOPHAGOGASTRODUODENOSCOPY (EGD) WITH PROPOFOL;  Surgeon: [name], MD;  Location: [facility];  Service: Endoscopy;  Laterality: N/A;   HAND SURGERY Left    PERIPHERAL VASCULAR CATHETERIZATION N/A 11/17/2015   Procedure: Upper Extremity Angiography;  Surgeon: Wellington Hampshire, MD;  Location: [place] [facility];  Service: Cardiovascular;  Laterality: N/A;   POLYPECTOMY  05/04/2017   Procedure: POLYPECTOMY;  Surgeon: [name], MD;  Location: [place];  Service: Endoscopy;;    FAMILY HISTORY :   Family History  Problem Relation Age of Onset   Leukemia Mother     SOCIAL HISTORY:   Social History   Tobacco Use   Smoking status: Former    Packs/day: 1.00    Years: 20.00    Pack years: 20.00    Types: Cigarettes    Quit date: 08/30/2004    Years since quitting: 16.7   Smokeless tobacco: Never  Vaping Use   Vaping Use: Never used  Substance Use Topics   Alcohol use: No   Drug use: No    ALLERGIES:  is allergic to allopurinol and probenecid.  MEDICATIONS:  Current Outpatient Medications  Medication Sig Dispense Refill   albuterol (VENTOLIN HFA) 108 (90 Base) MCG/ACT inhaler Inhale into the lungs as needed.     diltiazem (CARDIZEM CD) 120 MG 24 hr capsule Take 1 capsule (120 mg total) by mouth daily. 90 capsule 1   enalapril (VASOTEC) 20 MG tablet TAKE 1 TABLET EVERY DAY 90 tablet 1   fluticasone (FLONASE) 50 MCG/ACT nasal spray Place into the nose as needed.     fluticasone-salmeterol (ADVAIR) 250-50 MCG/ACT AEPB Inhale into the lungs as needed.     ipratropium (ATROVENT) 0.03 % nasal spray Place into the nose as needed.     metoprolol tartrate (LOPRESSOR) 50 MG tablet TAKE 1 AND 1/2 TABLETS (75 MG) BY MOUTH 2 (TWO) TIMES DAILY. 270 tablet 1   Multiple Vitamin (MULTI-VITAMINS) TABS Take by mouth.     mycophenolate (CELLCEPT) 500 MG tablet Take 2 tablets by  mouth 2 (two) times daily.     pantoprazole (PROTONIX) 40 MG tablet Take 40 mg by mouth 2 (two) times daily.     potassium chloride SA (KLOR-CON) 20 MEQ tablet Take 1 tablet (20 mEq total) by mouth daily. 90 tablet 1   predniSONE (DELTASONE) 5 MG tablet Take 10 mg by mouth daily with breakfast.     rosuvastatin (CRESTOR) 5 MG tablet Take 1 tablet (5 mg total) by mouth daily. 30 tablet 1   sildenafil (VIAGRA) 100 MG tablet Take 1 tablet (100 mg total) by mouth daily as needed for erectile dysfunction. 30 tablet 2   tadalafil (CIALIS) 5 MG tablet Take 1 tablet (5 mg total) by mouth daily. May increase to 10mg  as needed 30 tablet 1   torsemide (DEMADEX) 20 MG tablet TAKE 1 TABLET BY MOUTH EVERY DAY 30 tablet 1   warfarin (COUMADIN) 5 MG tablet Take 1 tablet (5 mg total) by mouth daily. Please start 12/1. Continue Eliquis. Appt 12/7 with coumadin clinic 30 tablet 0   No current facility-administered medications for this visit.    PHYSICAL EXAMINATION:   BP (!) 143/81 (  BP Location: Left Arm, Patient Position: Sitting, Cuff Size: Normal)    Pulse 76    Temp 97.8 F (36.6 C) (Tympanic)    Ht 5\' 10"  (1.778 m)    Wt 253 lb (114.8 kg)    SpO2 100%    BMI 36.30 kg/m   Filed Weights   06/07/21 1017  Weight: 253 lb (114.8 kg)   Physical Exam HENT:     Head: Normocephalic and atraumatic.     Mouth/Throat:     Pharynx: No oropharyngeal exudate.  Eyes:     Pupils: Pupils are equal, round, and reactive to light.  Cardiovascular:     Rate and Rhythm: Normal rate and regular rhythm.  Pulmonary:     Effort: Pulmonary effort is normal. No respiratory distress.     Breath sounds: Normal breath sounds. No wheezing.  Abdominal:     General: Bowel sounds are normal. There is no distension.     Palpations: Abdomen is soft. There is no mass.     Tenderness: There is no abdominal tenderness. There is no guarding or rebound.  Musculoskeletal:        General: No tenderness. Normal range of motion.      Cervical back: Normal range of motion and neck supple.  Skin:    General: Skin is warm.  Neurological:     Mental Status: He is alert and oriented to person, place, and time.  Psychiatric:        Mood and Affect: Affect normal.    LABORATORY DATA:  I have reviewed the data as listed    Component Value Date/Time   NA 137 06/07/2021 1001   NA 143 04/07/2021 0939   NA 137 08/27/2014 0403   K 3.9 06/07/2021 1001   K 4.1 08/27/2014 0403   CL 104 06/07/2021 1001   CL 108 08/27/2014 0403   CO2 27 06/07/2021 1001   CO2 26 08/27/2014 0403   GLUCOSE 85 06/07/2021 1001   GLUCOSE 94 08/27/2014 0403   BUN 28 (H) 06/07/2021 1001   BUN 22 04/07/2021 0939   BUN 16 08/27/2014 0403   CREATININE 1.05 06/07/2021 1001   CREATININE 0.85 08/27/2014 0403   CALCIUM 9.0 06/07/2021 1001   CALCIUM 8.6 (L) 08/27/2014 0403   PROT 6.6 04/18/2021 0908   ALBUMIN 4.1 04/18/2021 0908   AST 14 04/18/2021 0908   ALT 16 04/18/2021 0908   ALKPHOS 53 04/18/2021 0908   BILITOT 0.3 04/18/2021 0908   GFRNONAA >60 06/07/2021 1001   GFRNONAA >60 08/27/2014 0403   GFRAA >60 11/25/2019 0958   GFRAA >60 08/27/2014 0403    No results found for: SPEP, UPEP  Lab Results  Component Value Date   WBC 10.7 (H) 06/07/2021   NEUTROABS 8.3 (H) 06/07/2021   HGB 12.3 (L) 06/07/2021   HCT 40.2 06/07/2021   MCV 90.1 06/07/2021   PLT 186 06/07/2021      Chemistry      Component Value Date/Time   NA 137 06/07/2021 1001   NA 143 04/07/2021 0939   NA 137 08/27/2014 0403   K 3.9 06/07/2021 1001   K 4.1 08/27/2014 0403   CL 104 06/07/2021 1001   CL 108 08/27/2014 0403   CO2 27 06/07/2021 1001   CO2 26 08/27/2014 0403   BUN 28 (H) 06/07/2021 1001   BUN 22 04/07/2021 0939   BUN 16 08/27/2014 0403   CREATININE 1.05 06/07/2021 1001   CREATININE 0.85 08/27/2014 0403  Component Value Date/Time   CALCIUM 9.0 06/07/2021 1001   CALCIUM 8.6 (L) 08/27/2014 0403   ALKPHOS 53 04/18/2021 0908   AST 14 04/18/2021 0908    ALT 16 04/18/2021 0908   BILITOT 0.3 04/18/2021 0908       RADIOGRAPHIC STUDIES: I have personally reviewed the radiological images as listed and agreed with the findings in the report. No results found.   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  # Hemoglobin today 12-13 No evidence of hemolysis. STABLE. Check iron studies/ferritin today.   # History of A. fib on Coumadin- STABLE.   #Weight gain-secondary to steroids-counseled the patient weight loss. Pt is motivated.   # DISPOSITION: # check iron studies/ferritin # labs- cbc/LDH in 3 months # follow up in 6 months;-MD/-CBC/LDH/BMP; Haptoglobin-Dr.B    No orders of the defined types were placed in this encounter.  All questions were answered. The patient knows to call the clinic with any problems, questions or concerns.      Cammie Sickle, MD 06/07/2021 10:54 AM

## 2021-06-07 NOTE — Progress Notes (Unsigned)
erritin

## 2021-06-07 NOTE — Assessment & Plan Note (Addendum)
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  # Hemoglobin today 12-13 No evidence of hemolysis. STABLE. Check iron studies/ferritin today.   # History of A. fib on Coumadin- STABLE.   #Weight gain-secondary to steroids-counseled the patient weight loss. Pt is motivated.   # DISPOSITION: # check iron studies/ferritin # labs- cbc/LDH in 3 months # follow up in 6 months;-MD/-CBC/LDH/BMP; Haptoglobin-Dr.B

## 2021-06-13 ENCOUNTER — Other Ambulatory Visit: Payer: Self-pay

## 2021-06-13 MED ORDER — ROSUVASTATIN CALCIUM 5 MG PO TABS: 5.0000 mg | ORAL_TABLET | Freq: Every day | ORAL | 3 refills | Status: DC

## 2021-06-22 ENCOUNTER — Other Ambulatory Visit: Payer: Self-pay

## 2021-06-22 ENCOUNTER — Ambulatory Visit (INDEPENDENT_AMBULATORY_CARE_PROVIDER_SITE_OTHER): Payer: Medicare HMO

## 2021-06-22 DIAGNOSIS — Z5181 Encounter for therapeutic drug level monitoring: Secondary | ICD-10-CM | POA: Diagnosis not present

## 2021-06-22 DIAGNOSIS — I4892 Unspecified atrial flutter: Secondary | ICD-10-CM | POA: Diagnosis not present

## 2021-06-22 LAB — POCT INR: INR: 2.8 (ref 2.0–3.0)

## 2021-06-22 NOTE — Patient Instructions (Signed)
-   continue dosage of warfarin 1 tablet every day EXCEPT 1/2 TABLET ON MONDAYS & FRIDAYS - recheck in 4 weeks [facility] [phone] - call if you are put on any antibiotics or steroids

## 2021-06-23 ENCOUNTER — Other Ambulatory Visit: Payer: Self-pay | Admitting: Nurse Practitioner

## 2021-06-28 ENCOUNTER — Other Ambulatory Visit: Payer: Self-pay | Admitting: Medical

## 2021-07-05 ENCOUNTER — Other Ambulatory Visit: Payer: Self-pay | Admitting: Cardiovascular Disease

## 2021-07-05 NOTE — Telephone Encounter (Signed)
Refill Request.  

## 2021-07-05 NOTE — Telephone Encounter (Signed)
Request for warfarin refill: Last INR 2.8 on 06/22/21 Lasy OV on 04/07/21  Refill approved

## 2021-07-08 ENCOUNTER — Ambulatory Visit: Payer: Medicare HMO | Admitting: Cardiovascular Disease

## 2021-07-08 ENCOUNTER — Encounter: Payer: Self-pay | Admitting: Cardiovascular Disease

## 2021-07-08 ENCOUNTER — Other Ambulatory Visit: Payer: Self-pay

## 2021-07-08 VITALS — BP 138/82 | HR 84 | Ht 70.0 in | Wt 254.0 lb

## 2021-07-08 DIAGNOSIS — I428 Other cardiomyopathies: Secondary | ICD-10-CM

## 2021-07-08 DIAGNOSIS — E785 Hyperlipidemia, unspecified: Secondary | ICD-10-CM

## 2021-07-08 DIAGNOSIS — I4892 Unspecified atrial flutter: Secondary | ICD-10-CM

## 2021-07-08 DIAGNOSIS — I43 Cardiomyopathy in diseases classified elsewhere: Secondary | ICD-10-CM

## 2021-07-08 DIAGNOSIS — I5022 Chronic systolic (congestive) heart failure: Secondary | ICD-10-CM

## 2021-07-08 DIAGNOSIS — I10 Essential (primary) hypertension: Secondary | ICD-10-CM

## 2021-07-08 DIAGNOSIS — J8410 Pulmonary fibrosis, unspecified: Secondary | ICD-10-CM

## 2021-07-08 DIAGNOSIS — R000 Tachycardia, unspecified: Secondary | ICD-10-CM

## 2021-07-08 DIAGNOSIS — I483 Typical atrial flutter: Secondary | ICD-10-CM

## 2021-07-08 MED ORDER — TORSEMIDE 20 MG PO TABS: 10.0000 mg | ORAL_TABLET | Freq: Every day | ORAL | 3 refills | Status: DC

## 2021-07-08 NOTE — Patient Instructions (Addendum)
Medication Instructions:  No changes  If you need a refill on your cardiac medications before your next appointment, please call your pharmacy.   Lab work: No new labs needed  Testing/Procedures: No new testing needed  Follow-Up: At [facility], you and your health needs are our priority.  As part of our continuing mission to provide you with exceptional heart care, we have created designated Provider Care Teams.  These Care Teams include your primary Cardiologist (physician) and Advanced Practice Providers (APPs -  Physician Assistants and Nurse Practitioners) who all work together to provide you with the care you need, when you need it.  You will need a follow up appointment in 12 months  Providers on your designated Care Team:   Christopher Berge, NP Ryan Dunn, PA-C Cadence Furth, PA-C  COVID-19 Vaccine Information can be found at: [email or web address] For questions related to vaccine distribution or appointments, please email [email or web address] or call [phone].   

## 2021-07-08 NOTE — Progress Notes (Addendum)
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%  improved to 55-60% on follow up echo,  pulmonary fibrosis on home oxygen all the time,  HTN,  rheumatoid arthritis,  admission to [facility] from 7/5-12/03/14  cardiac catheterization For chest pain showing no significant CAD,   Left upper extremity angiogram 11/17/2015 showing 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 Raul Del, stable Feels his breathing is stable  Retired, Works in yard No regular exercise program  On several medications for rheumatoid arthritis Weight trending higher  On warfarin, interested in NOAC but has been too expensive CBC stable  Lab work reviewed Total chol 183, LDL 105  EKG personally reviewed by myself on todays visit Shows normal sinus rhythm rate 84 bpm poor R wave progression through the anterior precordial leads, left axis deviation, LAD  Other past medical history reviewed On August 2017 he was in atrial flutter with rapid ventricular rate on anticoagulation, started on amiodarone converted to normal sinus rhythm,  Amiodarone dose decreased  Prior history ofSores on fingers b/l, Severe symptoms on one of his fingers, nonhealing ulcerations Improved following vascularsurgery December 2017   presented to the hospital 11/22/2015 with weakness, fall, hemoglobin of 5.5 Guiac negative, GI was consult and colonoscopy and EGD revealed no evidence of bleeding, Further evaluation by hematology confirmed hemolytic anemia, started on high-dose prednisone He did have transfusion 4   seen on a regular basis by Dr. Jefm Bryant for his mixed connective tissue disease Last visit January 2017 per the notes   Previous 30 day monitor that showed 3 episodes of PVCs in a bigeminal manner. He was asymptomatic. Arrhythmia sometimes in the morning, other times in the early evening. Was not on a consistent basis.    admitted to [facility] in April of 2016 for 3 week history of increased dyspnea and palpitations and found to be in new onset atrial flutter.   TTE showed EF 20-25%, global HK, possible bicuspid aortic valve.    successful TEE/DCCV on 4/1. 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    Repeat TTE  showed improved EF of 55-60%, select images suggestive of hypokinesis of the inferior and posterior myocardium. LV diastolic parameters were normal. Left atrium was normal in size. RV function was normal. PASP was normal. He was advised further work up if he has symptoms.    On 11/27/14 while at work he suddenly developed onset of palpitations that led to increased SOB, especially with ambulation causing patient to leave work early. Patient checked his pulse and  found it to be in the 140's.Pulse went back into the 60's to 80's without intervention.   Recurrent chest pain 7/5 he was woken up around 2 AM with left shoulder pain that radiated to his left chest.   He presented to [facility] for evaluation. Troponin was found to be mildly  elevated and flated trending 0.08-->0.05-->0.06-->0.07.    He underwent cardiac cath 48 hours which showed right dominant system, no significant CAD, normal LV gram with EF >55%    the patient reports hand sensitivity to cold which started about 2 years ago and has been worsening. His fingers turn blue and her with significant pain when he is exposed to cold weather. He usually does better in the summertime. He developed an ulceration on the tip of the left small finger about 3 months ago which has gradually worsened with early gangrenous changes. No trauma.    he underwent upper extremity duplex which showed atypical flow in the ulnar artery and normal flow through the radial artery.  PMH:   has a past medical history of Anemia (10/2015), Discoid lupus, Gangrene of finger ([place]), GERD (gastroesophageal reflux disease), Gout, Hemolytic anemia ([place]), History of nuclear stress test, HTN (hypertension), Normal coronary arteries, Paroxysmal atrial flutter ([place]), Pulmonary fibrosis ([place]), Pulmonary sarcoidosis ([place]), RA (rheumatoid arthritis) ([place]), and Tachycardia-induced cardiomyopathy ([place]).  PSH:    Past Surgical History:  Procedure Laterality Date   CARDIAC CATHETERIZATION N/A 12/03/2014   Procedure: Left Heart Cath and Coronary Angiography;  Surgeon: Minna Merritts, MD;  Location: [place] [facility];  Service: Cardiovascular;  Laterality: N/A;   COLONOSCOPY WITH PROPOFOL N/A 11/23/2015   Procedure: COLONOSCOPY WITH PROPOFOL;  Surgeon: [name], MD;  Location: [facility];  Service: Endoscopy;  Laterality: N/A;   COLONOSCOPY WITH PROPOFOL N/A 05/04/2017   Procedure: COLONOSCOPY WITH PROPOFOL;  Surgeon: [name], MD;  Location: [place];  Service: Endoscopy;  Laterality: N/A;   ESOPHAGOGASTRODUODENOSCOPY (EGD) WITH PROPOFOL N/A 11/23/2015   Procedure: ESOPHAGOGASTRODUODENOSCOPY (EGD) WITH PROPOFOL;  Surgeon: [name], MD;  Location: [facility];  Service: Endoscopy;   Laterality: N/A;   HAND SURGERY Left    PERIPHERAL VASCULAR CATHETERIZATION N/A 11/17/2015   Procedure: Upper Extremity Angiography;  Surgeon: Wellington Hampshire, MD;  Location: [place] [facility];  Service: Cardiovascular;  Laterality: N/A;   POLYPECTOMY  05/04/2017   Procedure: POLYPECTOMY;  Surgeon: [name], MD;  [facility];  Service: Endoscopy;;    Current Outpatient Medications  Medication Sig Dispense Refill   albuterol (VENTOLIN HFA) 108 (90 Base) MCG/ACT inhaler Inhale into the lungs as needed.     azaTHIOprine (IMURAN) 50 MG tablet Take 50 mg by mouth daily.     diltiazem (CARDIZEM CD) 120 MG 24 hr capsule TAKE 1 CAPSULE EVERY DAY 90 capsule 0   enalapril (VASOTEC) 20 MG tablet TAKE 1 TABLET EVERY DAY 90 tablet 1   fluticasone (FLONASE) 50 MCG/ACT nasal spray Place into the nose as needed.     fluticasone-salmeterol (ADVAIR) 250-50 MCG/ACT AEPB Inhale into the lungs as needed.     hydroxychloroquine (PLAQUENIL) 200 MG tablet Take 200 mg by mouth 2 (two) times daily.     ipratropium (ATROVENT) 0.03 % nasal spray Place into the nose as needed.     metoprolol tartrate (LOPRESSOR) 50 MG tablet TAKE 1 AND 1/2 TABLETS (75 MG) BY MOUTH 2 (TWO) TIMES DAILY. 270 tablet 1   Multiple Vitamin (MULTI-VITAMINS) TABS Take by mouth.     pantoprazole (  PROTONIX) 40 MG tablet Take 40 mg by mouth 2 (two) times daily.     potassium chloride SA (KLOR-CON) 20 MEQ tablet Take 1 tablet (20 mEq total) by mouth daily. 90 tablet 1   predniSONE (DELTASONE) 5 MG tablet Take 10 mg by mouth daily with breakfast.     rosuvastatin (CRESTOR) 5 MG tablet Take 1 tablet (5 mg total) by mouth daily. 90 tablet 3   sildenafil (VIAGRA) 100 MG tablet Take 1 tablet (100 mg total) by mouth daily as needed for erectile dysfunction. 30 tablet 2   tadalafil (CIALIS) 5 MG tablet Take 1 tablet (5 mg total) by mouth daily. May increase to 10mg  as needed 30 tablet 1   torsemide (DEMADEX) 20 MG tablet Take 0.5  tablets (10 mg total) by mouth daily. 15 tablet 0   warfarin (COUMADIN) 5 MG tablet TAKE 1 TABLET (5 MG TOTAL) BY MOUTH DAILY. 90 tablet 1   No current facility-administered medications for this visit.     Allergies:   Allopurinol and Probenecid   Social History:  The patient  reports that he quit smoking about 16 years ago. His smoking use included cigarettes. He has a 20.00 pack-year smoking history. He has never used smokeless tobacco. He reports that he does not drink alcohol and does not use drugs.   Family History:   family history includes Leukemia in his mother.    Review of Systems: Review of Systems  Constitutional: Negative.   HENT: Negative.    Respiratory:  Positive for shortness of breath.   Cardiovascular: Negative.   Gastrointestinal: Negative.   Musculoskeletal: Negative.   Neurological: Negative.   Psychiatric/Behavioral: Negative.    All other systems reviewed and are negative.  PHYSICAL EXAM: VS:  BP 138/82    Pulse 84    Ht 5\' 10"  (1.778 m)    Wt 254 lb (115.2 kg)    SpO2 96%    BMI 36.45 kg/m  , BMI Body mass index is 36.45 kg/m. Constitutional:  oriented to person, place, and time. No distress.  On oxygen HENT:  Head: Grossly normal Eyes:  no discharge. No scleral icterus.  Neck: No JVD, no carotid bruits  Cardiovascular: Regular rate and rhythm, no murmurs appreciated Pulmonary/Chest: Clear to auscultation bilaterally, no wheezes or rails Abdominal: Soft.  no distension.  no tenderness.  Musculoskeletal: Normal range of motion Neurological:  normal muscle tone. Coordination normal. No atrophy Skin: Skin warm and dry Psychiatric: normal affect, pleasant  Recent Labs: 03/22/2021: BNP 45.5 04/18/2021: ALT 16 06/07/2021: BUN 28; Creatinine, Ser 1.05; Hemoglobin 12.3; Platelets 186; Potassium 3.9; Sodium 137    Lipid Panel Lab Results  Component Value Date   CHOL 173 04/18/2021   HDL 49 04/18/2021   LDLCALC 92 04/18/2021   TRIG 187 (H)  04/18/2021      Wt Readings from Last 3 Encounters:  07/08/21 254 lb (115.2 kg)  06/07/21 253 lb (114.8 kg)  04/07/21 246 lb 8 oz (111.8 kg)     ASSESSMENT AND PLAN:  Essential hypertension -  Blood pressure is well controlled on today's visit. No changes made to the medications.  Frequent PVCs - Plan: EKG 12-Lead continue beta blockers, asymptomatic, no further work-up For sx, could increase metoprolol to 100 BID  Shortness of breath - Plan: EKG 12-Lead Pulmonary fibrosis, followed by pulmonary on oxygen Feels sx are stable  Typical atrial flutter (HCC) amiodarone stopped secondary to underlying lung disease Tolerating anticoagulation  maintaining normal sinus rhythm Stable  Pulmonary fibrosis (HCC) Currently on oxygen by nasal cannula, on room air on today's visit Followed by Dr. Darlis Loan symptoms are stable Recommended weight loss to assist with shortness of breath on exertion  Encounter for anticoagulation discussion and counseling No recent bleeding, tolerating warfarin Discussed Pradaxa going generic in the next 6 months   Total encounter time more than 30 minutes  Greater than 50% was spent in counseling and coordination of care with the patient    Orders Placed This Encounter  Procedures   EKG 12-Lead     Signed, Esmond Plants, M.D., Ph.D. 07/08/2021  [place], [place]

## 2021-07-18 ENCOUNTER — Other Ambulatory Visit: Payer: Self-pay | Admitting: Nurse Practitioner

## 2021-07-20 ENCOUNTER — Other Ambulatory Visit: Payer: Self-pay

## 2021-07-20 ENCOUNTER — Ambulatory Visit (INDEPENDENT_AMBULATORY_CARE_PROVIDER_SITE_OTHER): Payer: Medicare HMO

## 2021-07-20 DIAGNOSIS — Z5181 Encounter for therapeutic drug level monitoring: Secondary | ICD-10-CM

## 2021-07-20 DIAGNOSIS — I4892 Unspecified atrial flutter: Secondary | ICD-10-CM | POA: Diagnosis not present

## 2021-07-20 LAB — POCT INR: INR: 2 (ref 2.0–3.0)

## 2021-07-20 NOTE — Patient Instructions (Addendum)
-   try not to have any greens in the next day or so - continue dosage of warfarin 1 tablet every day EXCEPT 1/2 TABLET ON MONDAYS & FRIDAYS - recheck in 5 weeks [facility] [phone] - call if you are put on any antibiotics or steroids  Ask Dr. Rockey Situ about the [facility] Device

## 2021-07-29 ENCOUNTER — Telehealth: Payer: Self-pay | Admitting: Cardiovascular Disease

## 2021-07-29 NOTE — Telephone Encounter (Signed)
Lab results are available for review in care everywhere. ? ?Prelim reviewed by RN: ? ?Cr 1.4 ?BUN 21 ?K+ 4.4 ? ?Message fwd to the patients Cardiologist Dr. Rockey Situ to review and advise. ? ?

## 2021-07-29 NOTE — Telephone Encounter (Signed)
Pt c/o medication issue: ? ?1. Name of Medication: torsemide  ? ?2. How are you currently taking this medication (dosage and times per day)? 10 mg po q d  ? ?3. Are you having a reaction (difficulty breathing--STAT)? no ? ?4. What is your medication issue? Pcp did labs and discussed patient should hydrate due to kidney function and discuss fluid pill dose with cardiology.  Patient lab results in epic from [place] drawn 07-28-21 . ? ?

## 2021-08-01 NOTE — Telephone Encounter (Signed)
Was able to return call to Mr. Ou, advised on Dr. Donivan Scull recommendations based on his labs ? ?I would not stop the torsemide 10 daily  ?Typically renal function runs fine just the most recent lab looks a little bit dry  ?So either increase fluid intake slightly more on days when not drinking much we will hold the torsemide that day  ?For the past year renal function has been fine so likely something happened recently, got a little bit dry  ?Thx  ?TG  ? ?Mr. Murchison verbalized understanding, will continue his torsemide and thankful for the return call, will call back with any further concerns.  ?

## 2021-08-24 ENCOUNTER — Other Ambulatory Visit: Payer: Self-pay

## 2021-08-24 ENCOUNTER — Ambulatory Visit: Payer: Medicare HMO

## 2021-08-24 DIAGNOSIS — I4892 Unspecified atrial flutter: Secondary | ICD-10-CM | POA: Diagnosis not present

## 2021-08-24 DIAGNOSIS — Z5181 Encounter for therapeutic drug level monitoring: Secondary | ICD-10-CM

## 2021-08-24 LAB — POCT INR: INR: 1.8 — AB (ref 2.0–3.0)

## 2021-08-24 NOTE — Patient Instructions (Signed)
-   take 1 whole tablet tonight, then ?- continue dosage of warfarin 1 tablet every day EXCEPT 1/2 [place] ?- recheck in 6 weeks ?Coumadin Clinic [phone] - call if you are put on any antibiotics or steroids ?

## 2021-09-04 NOTE — Progress Notes (Signed)
09/11/21 ?9:06 PM  ? ?[name] ?18-Jan-1961 ?[id number] ? ?Referring provider:  ?Hughie Closs, PA-C ?[place],  [place] [address] ? ?Chief Complaint  ?Patient presents with  ? Follow-up  ?  80mh follow-up  ? ?Urological history  ?1. High risk hematuria ?-former smoker ?-no formal workup ?-incidental finding of 6-10 RBC's in 04/2020 ?-follow up UA's x 2 negative for micro heme ?  ?2. ED ?-contributing factors of age, BPH, HTN, obesity, CHF, CVA, anticoagulants and former smoker ?-SHIM 8 ?-managed with sildenafil 100 mg daily, on-demand-dosing ?  ?3. Prostate nodule ?-PSA 2.90 in 08/2021 ?-incidental finding of a 1 cm nodule in the right apex is appreciated on 05/2020 exam ?-prostate MRI 06/2020 No high-grade carcinoma within the peripheral zone.  PI-RADS: 1.  Enlarged nodular transitional zone most consistent benign prostate hypertrophy. PI-RADS: 2 Single small LEFT common iliac node ?-nodule not appreciated on today's exam  ?  ?4. BPH with LU TS ?-prostate volume 44 cc on 2022 MRI  ?-I PSS 15/6 ? ?5. Right scrotal swelling  ?- appreciated on exam on 03/07/2021 ?- Scrotal ultrasound visualized bilateral hydroceles, right greater than left. ? ? ?HPI: ?Devin Tippsis a 61y.o.male who presents today for a 6 month follow-up. ? ?He is experiencing frequency, urgency, urge incontinence a weak stream and a split urinary stream.  He states the frequency occurs after he takes his Demadex around 4 to 6 in the evening which causes him to go about 3 times every hour.  He also has urge incontinence during these episodes as well.  He is experiencing nocturia x1.  He typically has a weak urinary stream and on occasion he will notice a split urinary stream.  ? ?He is drinking 60 ounces of water daily. ? ?This has been occurring since January when he started the medication. ? ?Patient denies any modifying or aggravating factors.  Patient denies any gross hematuria, dysuria or  suprapubic/flank pain.  Patient denies any fevers, chills, nausea or vomiting.   ? ? IPSS   ? ? RBucknerName 09/05/21 0900  ?  ?  ?  ? International Prostate Symptom Score  ? How often have you had the sensation of not emptying your bladder? Less than half the time    ? How often have you had to urinate less than every two hours? About half the time    ? How often have you found you stopped and started again several times when you urinated? About half the time    ? How often have you found it difficult to postpone urination? Not at All    ? How often have you had a weak urinary stream? More than half the time    ? How often have you had to strain to start urination? Less than 1 in 5 times    ? How many times did you typically get up at night to urinate? 2 Times    ? Total IPSS Score 15    ?  ? Quality of Life due to urinary symptoms  ? If you were to spend the rest of your life with your urinary condition just the way it is now how would you feel about that? Terrible    ? ?  ?  ? ?  ? ? ?Score:  ?1-7 Mild ?8-19 Moderate ?20-35 Severe ? ?He is not sexually active at this time and has not tried his Viagra consistently. When he would take Viagra he  noticed his heart rate increased. He intermittently has spontaneous erections.  ? ? ? SHIM   ? ? [place] Name 09/05/21 0936  ?  ?  ?  ? SHIM: Over the last 6 months:  ? How do you rate your confidence that you could get and keep an erection? Low    ? When you had erections with sexual stimulation, how often were your erections hard enough for penetration (entering your partner)? Almost Never or Never    ? During sexual intercourse, how often were you able to maintain your erection after you had penetrated (entered) your partner? Almost Never or Never    ? During sexual intercourse, how difficult was it to maintain your erection to completion of intercourse? Very Difficult    ? When you attempted sexual intercourse, how often was it satisfactory for you? A Few Times (much less than  half the time)    ?  ? SHIM Total Score  ? SHIM 8    ? ?  ?  ? ?  ? ? ?PMH: ?Past Medical History:  ?Diagnosis Date  ? Anemia 10/2015  ? Discoid lupus   ? Gangrene of finger ([place])   ? GERD (gastroesophageal reflux disease)   ? Gout   ? Hemolytic anemia (HCC)   ? History of nuclear stress test   ? a. 09/02/2014: no sig ischemia, GI uptake noted, no sig WMA, EF 48%, no EKG chanes concerning for ischemia, low risk scan    ? HTN (hypertension)   ? Normal coronary arteries   ? a. cardiac cath 12/03/2014: no significant CAD, right dominant system, LVEF >55%, no MR or AS  ? Paroxysmal atrial flutter ([place])   ? a. s/p successful TEE/DCCV on 08/28/2014-->was on xarelto, now on warfarin.  ? Pulmonary fibrosis ([place])   ? a. not on home oxygen  ? Pulmonary sarcoidosis ([place])   ? RA (rheumatoid arthritis) ([place])   ? Tachycardia-induced cardiomyopathy ([place])   ? a. echo 07/2014: EF 20-25%, cannot exclude atrial flutter,  global HK, possible bicuspid Ao valve, mod reduced RV sys fxn, mild-mod aortic valve sclerosis/calcification, mod TR, mildly elevated RVSP; b. TEE 08/28/2014: EF 30-35%, no intracardic thrombus, mildly dilated LA/RA, mild TR, mild-mod aortic sclerosis w/o stenosis; c. echo 09/2014: EF 55-60%, select images w/ inf HK; d. 10/2015 Echo: EF 60-65%  ? ? ?Surgical History: ?Past Surgical History:  ?Procedure Laterality Date  ? CARDIAC CATHETERIZATION N/A 12/03/2014  ? Procedure: Left Heart Cath and Coronary Angiography;  Surgeon: Minna Merritts, MD;  Location: [place] [facility];  Service: Cardiovascular;  Laterality: N/A;  ? COLONOSCOPY WITH PROPOFOL N/A 11/23/2015  ? Procedure: COLONOSCOPY WITH PROPOFOL;  Surgeon: [name], MD;  Location: [facility];  Service: Endoscopy;  Laterality: N/A;  ? COLONOSCOPY WITH PROPOFOL N/A 05/04/2017  ? Procedure: COLONOSCOPY WITH PROPOFOL;  Surgeon: [name], MD;  Location: [place];  Service: Endoscopy;  Laterality: N/A;  ? ESOPHAGOGASTRODUODENOSCOPY (EGD) WITH PROPOFOL N/A  11/23/2015  ? Procedure: ESOPHAGOGASTRODUODENOSCOPY (EGD) WITH PROPOFOL;  Surgeon: [name], MD;  Location: [facility];  Service: Endoscopy;  Laterality: N/A;  ? HAND SURGERY Left   ? PERIPHERAL VASCULAR CATHETERIZATION N/A 11/17/2015  ? Procedure: Upper Extremity Angiography;  Surgeon: Wellington Hampshire, MD;  Location: [place] [facility];  Service: Cardiovascular;  Laterality: N/A;  ? POLYPECTOMY  05/04/2017  ? Procedure: POLYPECTOMY;  Surgeon: [name], MD;  Location: [place];  Service: Endoscopy;;  ? ? ?Home Medications:  ?Allergies  as of 09/05/2021   ? ?   Reactions  ? Allopurinol Diarrhea  ? Probenecid Other (See Comments), Nausea Only  ? Other reaction(s): Headache  ? ?  ? ?  ?Medication List  ?  ? ?  ? Accurate as of September 05, 2021 11:59 PM. If you have any questions, ask your nurse or doctor.  ?  ?  ? ?  ? ?albuterol 108 (90 Base) MCG/ACT inhaler ?Commonly known as: VENTOLIN HFA ?Inhale into the lungs as needed. ?  ?azaTHIOprine 50 MG tablet ?Commonly known as: IMURAN ?Take 50 mg by mouth daily. ?  ?diltiazem 120 MG 24 hr capsule ?Commonly known as: CARDIZEM CD ?TAKE 1 CAPSULE EVERY DAY ?  ?enalapril 20 MG tablet ?Commonly known as: VASOTEC ?TAKE 1 TABLET EVERY DAY ?  ?fluticasone 50 MCG/ACT nasal spray ?Commonly known as: FLONASE ?Place into the nose as needed. ?  ?fluticasone-salmeterol 250-50 MCG/ACT Aepb ?Commonly known as: ADVAIR ?Inhale into the lungs as needed. ?  ?hydroxychloroquine 200 MG tablet ?Commonly known as: PLAQUENIL ?Take 200 mg by mouth 2 (two) times daily. ?  ?ipratropium 0.03 % nasal spray ?Commonly known as: ATROVENT ?Place into the nose as needed. ?  ?metoprolol tartrate 50 MG tablet ?Commonly known as: LOPRESSOR ?TAKE 1 AND 1/2 TABLETS TWICE DAILY ?  ?Multi-Vitamins Tabs ?Take by mouth. ?  ?pantoprazole 40 MG tablet ?Commonly known as: PROTONIX ?Take 40 mg by mouth 2 (two) times daily. ?  ?potassium chloride SA 20 MEQ tablet ?Commonly known as: KLOR-CON M ?TAKE 1  TABLET EVERY DAY ?  ?predniSONE 5 MG tablet ?Commonly known as: DELTASONE ?Take 10 mg by mouth daily with breakfast. ?  ?rosuvastatin 5 MG tablet ?Commonly known as: CRESTOR ?Take 1 tablet (5 mg total) by mouth daily. ?

## 2021-09-05 ENCOUNTER — Other Ambulatory Visit
Admission: RE | Admit: 2021-09-05 | Discharge: 2021-09-05 | Disposition: A | Discharge status: Home / Self Care | Payer: Medicare HMO | Attending: Urology | Admitting: Urology

## 2021-09-05 ENCOUNTER — Ambulatory Visit: Payer: Medicare HMO | Admitting: Urology

## 2021-09-05 ENCOUNTER — Encounter: Payer: Self-pay | Admitting: Urology

## 2021-09-05 ENCOUNTER — Other Ambulatory Visit: Payer: Self-pay | Admitting: *Deleted

## 2021-09-05 VITALS — BP 130/85 | HR 83 | Ht 70.0 in | Wt 248.0 lb

## 2021-09-05 DIAGNOSIS — N138 Other obstructive and reflux uropathy: Secondary | ICD-10-CM | POA: Insufficient documentation

## 2021-09-05 DIAGNOSIS — N529 Male erectile dysfunction, unspecified: Secondary | ICD-10-CM | POA: Diagnosis not present

## 2021-09-05 DIAGNOSIS — N401 Enlarged prostate with lower urinary tract symptoms: Secondary | ICD-10-CM | POA: Diagnosis present

## 2021-09-05 DIAGNOSIS — N402 Nodular prostate without lower urinary tract symptoms: Secondary | ICD-10-CM

## 2021-09-05 LAB — PSA: Prostatic Specific Antigen: 2.9 ng/mL (ref 0.00–4.00)

## 2021-09-06 ENCOUNTER — Inpatient Hospital Stay: Payer: Medicare HMO | Attending: Internal Medicine

## 2021-09-06 DIAGNOSIS — D591 Autoimmune hemolytic anemia, unspecified: Secondary | ICD-10-CM | POA: Diagnosis not present

## 2021-09-06 DIAGNOSIS — D594 Other nonautoimmune hemolytic anemias: Secondary | ICD-10-CM

## 2021-09-06 LAB — CBC WITH DIFFERENTIAL/PLATELET
Abs Immature Granulocytes: 0.08 10*3/uL — ABNORMAL HIGH (ref 0.00–0.07)
Basophils Absolute: 0.1 10*3/uL (ref 0.0–0.1)
Basophils Relative: 1 %
Eosinophils Absolute: 0.1 10*3/uL (ref 0.0–0.5)
Eosinophils Relative: 1 %
HCT: 43.8 % (ref 39.0–52.0)
Hemoglobin: 13.2 g/dL (ref 13.0–17.0)
Immature Granulocytes: 1 %
Lymphocytes Relative: 12 %
Lymphs Abs: 1.2 10*3/uL (ref 0.7–4.0)
MCH: 26.5 pg (ref 26.0–34.0)
MCHC: 30.1 g/dL (ref 30.0–36.0)
MCV: 87.8 fL (ref 80.0–100.0)
Monocytes Absolute: 0.6 10*3/uL (ref 0.1–1.0)
Monocytes Relative: 6 %
Neutro Abs: 7.6 10*3/uL (ref 1.7–7.7)
Neutrophils Relative %: 79 %
Platelets: 202 10*3/uL (ref 150–400)
RBC: 4.99 MIL/uL (ref 4.22–5.81)
RDW: 15.2 % (ref 11.5–15.5)
WBC: 9.6 10*3/uL (ref 4.0–10.5)
nRBC: 0 % (ref 0.0–0.2)

## 2021-09-06 LAB — LACTATE DEHYDROGENASE: LDH: 157 U/L (ref 98–192)

## 2021-09-13 DIAGNOSIS — M1A09X0 Idiopathic chronic gout, multiple sites, without tophus (tophi): Secondary | ICD-10-CM | POA: Insufficient documentation

## 2021-09-14 ENCOUNTER — Ambulatory Visit: Payer: Medicare HMO | Admitting: Urology

## 2021-09-14 ENCOUNTER — Encounter: Payer: Self-pay | Admitting: Urology

## 2021-09-14 VITALS — BP 150/86 | HR 70 | Ht 70.0 in | Wt 248.0 lb

## 2021-09-14 DIAGNOSIS — N529 Male erectile dysfunction, unspecified: Secondary | ICD-10-CM

## 2021-09-14 DIAGNOSIS — N402 Nodular prostate without lower urinary tract symptoms: Secondary | ICD-10-CM | POA: Diagnosis not present

## 2021-09-14 DIAGNOSIS — Z125 Encounter for screening for malignant neoplasm of prostate: Secondary | ICD-10-CM

## 2021-09-14 DIAGNOSIS — R350 Frequency of micturition: Secondary | ICD-10-CM

## 2021-09-14 DIAGNOSIS — R3915 Urgency of urination: Secondary | ICD-10-CM

## 2021-09-14 DIAGNOSIS — R399 Unspecified symptoms and signs involving the genitourinary system: Secondary | ICD-10-CM

## 2021-09-14 MED ORDER — SILDENAFIL CITRATE 50 MG PO TABS: 50.0000 mg | ORAL_TABLET | Freq: Every day | ORAL | 11 refills | Status: DC | PRN

## 2021-09-14 MED ORDER — TAMSULOSIN HCL 0.4 MG PO CAPS: 0.4000 mg | ORAL_CAPSULE | Freq: Every day | ORAL | 11 refills | Status: DC

## 2021-09-14 NOTE — Progress Notes (Signed)
? ?  09/14/2021 ?10:21 AM  ? ?[name] ?01/24/1961 ?[id number] ? ?Reason for visit: Urinary symptoms, ED, prostate nodule ? ?HPI: ?61 year old male with a number of comorbidities including obesity with BMI of 36, A-fib on Coumadin, and high-dose diuretics, history of prostate nodule with normal prostate MRI and normal PSA, who was originally scheduled for cystoscopy today by Zara Council, PA. ? ?We had a long conversation today and he ultimately opted to defer cystoscopy. ? ?His primary urinary complaints are urgency and frequency when he takes his torsemide.  He denies any gross hematuria or dysuria.  I personally viewed and interpreted the prostate MRI that shows a 44 g prostate with no suspicious lesions, normal-appearing bladder on the MRI.  His urinary symptoms seem very closely tied to his torsemide dosing.  He is interested in starting with a trial of Flomax to see if this can improve some of his urinary symptoms, but I had a very frank conversation about the diuretic effect on urination, as well as his obesity contributing to the urinary symptoms.  He is agreeable to this plan, and will consider cystoscopy in the future if worsening urinary symptoms.   ? ?He also has problems with erections, and has been hesitant to try his medications with his atrial fibrillation.  Reassurance was provided that the only major contraindication to PDE5 inhibitors would be nitroglycerin, which she does not take.  He is interested in a trial of the sildenafil, and he thinks the Cialis gave him more of a headache.  Risks and benefits discussed. ? ?-Trial of Flomax 0.4 mg nightly, behavioral strategies also discussed regarding bladder irritants and weight loss  ?-Trial of sildenafil 50 to 100 mg on demand for ED ?-Can continue PSA screening every 2 years based on guidelines ?-RTC 3 months PVR and symptom check ? ?Billey Co, MD ? ?[facility] ?[address] ?[place], [place] [address] ?[phone] ? ? ?

## 2021-09-14 NOTE — Patient Instructions (Addendum)
Sildenafil Tablets (Erectile Dysfunction) ?What is this medication? ?SILDENAFIL (sil DEN a fil) treats erectile dysfunction (ED). It works by increasing blood flow to the penis, which helps to maintain an erection. ?This medicine may be used for other purposes; ask your health care provider or pharmacist if you have questions. ?COMMON BRAND NAME(S): Viagra ?What should I tell my care team before I take this medication? ?They need to know if you have any of these conditions: ?Bleeding disorders ?Eye or vision problems, including a rare inherited eye disease called retinitis pigmentosa ?Anatomical deformation of the penis, Peyronie's disease, or history of priapism (painful and prolonged erection) ?Heart disease, angina, a history of heart attack, irregular heartbeats, or other heart problems ?High or low blood pressure ?History of blood diseases, like sickle cell anemia or leukemia ?History of stomach bleeding ?Kidney disease ?Liver disease ?Stroke ?An unusual or allergic reaction to sildenafil, other medications, foods, dyes, or preservatives ?Pregnant or trying to get pregnant ?Breast-feeding ?How should I use this medication? ?Take this medication by mouth with a glass of water. Follow the directions on the prescription label. The dose is usually taken 1 hour before sexual activity. You should not take the dose more than once per day. Do not take your medication more often than directed. ?Talk to your care team about the use of this medication in children. This medication is not used in children for this condition. ?Overdosage: If you think you have taken too much of this medicine contact a poison control center or emergency room at once. ?NOTE: This medicine is only for you. Do not share this medicine with others. ?What if I miss a dose? ?This does not apply. Do not take double or extra doses. ?What may interact with this medication? ?Do not take this medication with any of the following: ?Cisapride ?Nitrates  like amyl nitrite, isosorbide dinitrate, isosorbide mononitrate, nitroglycerin ?Riociguat ?This medication may also interact with the following: ?Antiviral medications for HIV or AIDS ?Bosentan ?Certain medications for benign prostatic hyperplasia (BPH) ?Certain medications for blood pressure ?Certain medications for fungal infections like ketoconazole and itraconazole ?Cimetidine ?Erythromycin ?Rifampin ?This list may not describe all possible interactions. Give your health care provider a list of all the medicines, herbs, non-prescription drugs, or dietary supplements you use. Also tell them if you smoke, drink alcohol, or use illegal drugs. Some items may interact with your medicine. ?What should I watch for while using this medication? ?If you notice any changes in your vision while taking this medication, call your care team as soon as possible. Stop using this medication and call your care team right away if you have a loss of sight in one or both eyes. ?Contact your care team right away if you have an erection that lasts longer than 4 hours or if it becomes painful. This may be a sign of a serious problem and must be treated right away to prevent permanent damage. ?If you experience symptoms of nausea, dizziness, chest pain or arm pain upon initiation of sexual activity after taking this medication, you should refrain from further activity and call your care team as soon as possible. ?Do not drink alcohol to excess (examples, 5 glasses of wine or 5 shots of whiskey) when taking this medication. When taken in excess, alcohol can increase your chances of getting a headache or getting dizzy, increasing your heart rate or lowering your blood pressure. ?Using this medication does not protect you or your partner against HIV infection (the virus that causes AIDS)  or other sexually transmitted diseases. ?What side effects may I notice from receiving this medication? ?Side effects that you should report to your care  team as soon as possible: ?Allergic reactions--skin rash, itching, hives, swelling of the face, lips, tongue, or throat ?Hearing loss or ringing in ears ?Heart attack--pain or tightness in the chest, shoulders, arms, or jaw, nausea, shortness of breath, cold or clammy skin, feeling faint or lightheaded ?Heart rhythm changes--fast or irregular heartbeat, dizziness, feeling faint or lightheaded, chest pain, trouble breathing ?Low blood pressure--dizziness, feeling faint or lightheaded, blurry vision ?New or worsening shortness of breath ?Prolonged or painful erection ?Stroke--sudden numbness or weakness of the face, arm, or leg, trouble speaking, confusion, trouble walking, loss of balance or coordination, dizziness, severe headache, change in vision ?Sudden vision loss in one or both eyes ?Side effects that usually do not require medical attention (report to your care team if they continue or are bothersome): ?Facial flushing or redness ?Headache ?Nosebleed ?Runny or stuffy nose ?Trouble sleeping ?Upset stomach ?This list may not describe all possible side effects. Call your doctor for medical advice about side effects. You may report side effects to FDA at 1-800-FDA-1088. ?Where should I keep my medication? ?Keep out of reach of children and pets. ?Store at room temperature between 15 and 30 degrees C (59 and 86 degrees F). Throw away any unused medication after the expiration date. ?NOTE: This sheet is a summary. It may not cover all possible information. If you have questions about this medicine, talk to your doctor, pharmacist, or health care provider. ?? 2023 Elsevier/Gold Standard (2020-07-09 00:00:00) ?Benign Prostatic Hyperplasia ? ?Benign prostatic hyperplasia (BPH) is an enlarged prostate gland that is caused by the normal aging process. The prostate may get bigger as a man gets older. The condition is not caused by cancer. The prostate is a walnut-sized gland that is involved in the production of semen. It  is located in front of the rectum and below the bladder. The bladder stores urine. The urethra carries stored urine out of the body. ?An enlarged prostate can press on the urethra. This can make it harder to pass urine. The buildup of urine in the bladder can cause infection. Back pressure and infection may progress to bladder damage and kidney (renal) failure. ?What are the causes? ?This condition is part of the normal aging process. However, not all men develop problems from this condition. If the prostate enlarges away from the urethra, urine flow will not be blocked. If it enlarges toward the urethra and compresses it, there will be problems passing urine. ?What increases the risk? ?This condition is more likely to develop in men older than 50 years. ?What are the signs or symptoms? ?Symptoms of this condition include: ?Getting up often during the night to urinate. ?Needing to urinate frequently during the day. ?Difficulty starting urine flow. ?Decrease in size and strength of your urine stream. ?Leaking (dribbling) after urinating. ?Inability to pass urine. This needs immediate treatment. ?Inability to completely empty your bladder. ?Pain when you pass urine. This is more common if there is also an infection. ?Urinary tract infection (UTI). ?How is this diagnosed? ?This condition is diagnosed based on your medical history, a physical exam, and your symptoms. Tests will also be done, such as: ?A post-void bladder scan. This measures any amount of urine that may remain in your bladder after you finish urinating. ?A digital rectal exam. In a rectal exam, your health care provider checks your prostate by putting a lubricated,  gloved finger into your rectum to feel the back of your prostate gland. This exam detects the size of your gland and any abnormal lumps or growths. ?An exam of your urine (urinalysis). ?A prostate specific antigen (PSA) screening. This is a blood test used to screen for prostate cancer. ?An  ultrasound. This test uses sound waves to electronically produce a picture of your prostate gland. ?Your health care provider may refer you to a specialist in kidney and prostate diseases (urologist). ?How

## 2021-10-05 ENCOUNTER — Ambulatory Visit (INDEPENDENT_AMBULATORY_CARE_PROVIDER_SITE_OTHER): Payer: Medicare HMO

## 2021-10-05 DIAGNOSIS — I4892 Unspecified atrial flutter: Secondary | ICD-10-CM | POA: Diagnosis not present

## 2021-10-05 DIAGNOSIS — Z5181 Encounter for therapeutic drug level monitoring: Secondary | ICD-10-CM | POA: Diagnosis not present

## 2021-10-05 LAB — POCT INR: INR: 1.7 — AB (ref 2.0–3.0)

## 2021-10-05 NOTE — Patient Instructions (Signed)
-   INCREASE TO 1 tablet every day EXCEPT 1/2 TABLET ON MONDAYS & FRIDAYS ?- recheck in 6 weeks ?[facility] [phone] - call if you are put on any antibiotics or steroids ?

## 2021-11-16 ENCOUNTER — Ambulatory Visit (INDEPENDENT_AMBULATORY_CARE_PROVIDER_SITE_OTHER): Payer: Medicare HMO

## 2021-11-16 DIAGNOSIS — I4892 Unspecified atrial flutter: Secondary | ICD-10-CM

## 2021-11-16 DIAGNOSIS — Z5181 Encounter for therapeutic drug level monitoring: Secondary | ICD-10-CM

## 2021-11-16 LAB — POCT INR: INR: 2.1 (ref 2.0–3.0)

## 2021-11-16 NOTE — Patient Instructions (Signed)
-   CONTINUE 1 tablet every day EXCEPT 1/2 TABLET ON MONDAYS & FRIDAYS - recheck in 6 weeks [facility] [phone] - call if you are put on any antibiotics or steroids

## 2021-11-23 ENCOUNTER — Other Ambulatory Visit
Admission: RE | Admit: 2021-11-23 | Discharge: 2021-11-23 | Disposition: A | Discharge status: Home / Self Care | Payer: Medicare HMO | Source: Ambulatory Visit | Attending: Specialist | Admitting: Specialist

## 2021-11-23 DIAGNOSIS — R0609 Other forms of dyspnea: Secondary | ICD-10-CM | POA: Insufficient documentation

## 2021-11-23 LAB — SEDIMENTATION RATE: Sed Rate: 11 mm/hr (ref 0–20)

## 2021-11-24 ENCOUNTER — Other Ambulatory Visit
Admission: RE | Admit: 2021-11-24 | Discharge: 2021-11-24 | Disposition: A | Discharge status: Home / Self Care | Payer: Medicare HMO | Source: Ambulatory Visit | Attending: Specialist | Admitting: Specialist

## 2021-11-24 DIAGNOSIS — R0609 Other forms of dyspnea: Secondary | ICD-10-CM | POA: Diagnosis present

## 2021-11-24 LAB — BRAIN NATRIURETIC PEPTIDE: B Natriuretic Peptide: 40 pg/mL (ref 0.0–100.0)

## 2021-11-25 ENCOUNTER — Other Ambulatory Visit: Payer: Self-pay | Admitting: Specialist

## 2021-11-25 DIAGNOSIS — R0609 Other forms of dyspnea: Secondary | ICD-10-CM

## 2021-11-25 DIAGNOSIS — J849 Interstitial pulmonary disease, unspecified: Secondary | ICD-10-CM

## 2021-12-02 ENCOUNTER — Ambulatory Visit
Admission: RE | Admit: 2021-12-02 | Discharge: 2021-12-02 | Disposition: A | Discharge status: Home / Self Care | Payer: Medicare HMO | Source: Ambulatory Visit | Attending: Specialist | Admitting: Specialist

## 2021-12-02 DIAGNOSIS — R0609 Other forms of dyspnea: Secondary | ICD-10-CM

## 2021-12-02 DIAGNOSIS — J849 Interstitial pulmonary disease, unspecified: Secondary | ICD-10-CM

## 2021-12-06 ENCOUNTER — Encounter: Payer: Self-pay | Admitting: Internal Medicine

## 2021-12-06 ENCOUNTER — Inpatient Hospital Stay: Payer: Medicare HMO | Admitting: Internal Medicine

## 2021-12-06 ENCOUNTER — Inpatient Hospital Stay: Payer: Medicare HMO | Attending: Internal Medicine

## 2021-12-06 VITALS — BP 120/80 | HR 69 | Temp 97.6°F | Resp 16 | Wt 252.0 lb

## 2021-12-06 DIAGNOSIS — D594 Other nonautoimmune hemolytic anemias: Secondary | ICD-10-CM

## 2021-12-06 DIAGNOSIS — Z7901 Long term (current) use of anticoagulants: Secondary | ICD-10-CM | POA: Diagnosis not present

## 2021-12-06 DIAGNOSIS — Z79899 Other long term (current) drug therapy: Secondary | ICD-10-CM | POA: Diagnosis not present

## 2021-12-06 DIAGNOSIS — D591 Autoimmune hemolytic anemia, unspecified: Secondary | ICD-10-CM | POA: Diagnosis present

## 2021-12-06 DIAGNOSIS — Z7952 Long term (current) use of systemic steroids: Secondary | ICD-10-CM | POA: Diagnosis not present

## 2021-12-06 DIAGNOSIS — I4891 Unspecified atrial fibrillation: Secondary | ICD-10-CM | POA: Diagnosis not present

## 2021-12-06 LAB — CBC WITH DIFFERENTIAL/PLATELET
Abs Immature Granulocytes: 0.15 10*3/uL — ABNORMAL HIGH (ref 0.00–0.07)
Basophils Absolute: 0 10*3/uL (ref 0.0–0.1)
Basophils Relative: 0 %
Eosinophils Absolute: 0.1 10*3/uL (ref 0.0–0.5)
Eosinophils Relative: 1 %
HCT: 39.8 % (ref 39.0–52.0)
Hemoglobin: 12.1 g/dL — ABNORMAL LOW (ref 13.0–17.0)
Immature Granulocytes: 2 %
Lymphocytes Relative: 17 %
Lymphs Abs: 1.5 10*3/uL (ref 0.7–4.0)
MCH: 26.5 pg (ref 26.0–34.0)
MCHC: 30.4 g/dL (ref 30.0–36.0)
MCV: 87.1 fL (ref 80.0–100.0)
Monocytes Absolute: 1 10*3/uL (ref 0.1–1.0)
Monocytes Relative: 11 %
Neutro Abs: 6.3 10*3/uL (ref 1.7–7.7)
Neutrophils Relative %: 69 %
Platelets: 167 10*3/uL (ref 150–400)
RBC: 4.57 MIL/uL (ref 4.22–5.81)
RDW: 15.9 % — ABNORMAL HIGH (ref 11.5–15.5)
WBC: 9.1 10*3/uL (ref 4.0–10.5)
nRBC: 0 % (ref 0.0–0.2)

## 2021-12-06 LAB — BASIC METABOLIC PANEL
Anion gap: 6 (ref 5–15)
BUN: 23 mg/dL (ref 8–23)
CO2: 26 mmol/L (ref 22–32)
Calcium: 8.7 mg/dL — ABNORMAL LOW (ref 8.9–10.3)
Chloride: 107 mmol/L (ref 98–111)
Creatinine, Ser: 1.35 mg/dL — ABNORMAL HIGH (ref 0.61–1.24)
GFR, Estimated: 59 mL/min — ABNORMAL LOW (ref >60)
Glucose, Bld: 91 mg/dL (ref 70–99)
Potassium: 3.8 mmol/L (ref 3.5–5.1)
Sodium: 139 mmol/L (ref 135–145)

## 2021-12-06 LAB — LACTATE DEHYDROGENASE: LDH: 163 U/L (ref 98–192)

## 2021-12-06 NOTE — Progress Notes (Unsigned)
Pt in for follow up, denies any concerns today. 

## 2021-12-06 NOTE — Assessment & Plan Note (Addendum)
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  # Hemoglobin today 12-13 No evidence of hemolysis. STABLE; JAN 2023- Ferritin- 27; iron sat- 21%-recommend gentle iron.   # GFR- 59-monitor for now.  # History of A. fib on Coumadin- STABLE.   # DISPOSITION: # follow up in mid FEB 2024--MD/-CBC/LDH/BMP; iron studies;ferritin--Dr.B

## 2021-12-06 NOTE — Patient Instructions (Signed)
#  Recommend gentle iron 1 pill a day; should not upset your stomach or cause constipation.  Talk to the pharmacist if you can find it/it is over-the-counter.  

## 2021-12-06 NOTE — Progress Notes (Signed)
[facility] OFFICE PROGRESS NOTE  Patient Care Team: Vallery Sa as PCP - General (Physician Assistant) Minna Merritts, MD as Consulting Physician (Cardiology) Cammie Sickle, MD as Consulting Physician (Oncology)   Oncology History   No history exists.   # July 2017 AUTOIMMUNE HEMOLYTIC ANEMIA [Hb-4-5]; ; s/p Solumedrol 1gm/day x3; Prednisone '90mg'$ /day; on taper.; Prednisone '10mg'$ / cellcept 500 BID[Dr.kernodle]  # CONNECTIVE TISSUE/AUTOIMMUNE DISORDER [Dr.Kenodl; A.fib on xarelto; EGD/[facility]-NEG [July 2017]; Aug 2018- coumadin [sec to cost]  # s/p left hand peri-arterial sympathetetcomy [ [facility]; [place]]   INTERVAL HISTORY: Alone.  Ambulating independently.  [name] 61 y.o.  male pleasant patient above history of Autoimmune hemolytic anemia- currently on prednisone 10 mg/CellCept 500 twice a day [also for Autoimmune/connective tissue disorder]- is here for follow-up.   Patient denies any worsening shortness of breath.  Patient follows up with Dr. Raul Del for pulmonary fibrosis.  Recent CT scan improved.  Chronic mild fatigue.   Review of Systems  Constitutional:  Negative for chills, diaphoresis, fever, malaise/fatigue and weight loss.  HENT:  Negative for nosebleeds and sore throat.   Eyes:  Negative for double vision.  Respiratory:  Negative for cough, hemoptysis, sputum production, shortness of breath and wheezing.   Cardiovascular:  Negative for chest pain, palpitations, orthopnea and leg swelling.  Gastrointestinal:  Negative for abdominal pain, blood in stool, constipation, diarrhea, heartburn, melena, nausea and vomiting.  Genitourinary:  Negative for dysuria, frequency and urgency.  Musculoskeletal:  Positive for back pain and joint pain.  Skin: Negative.  Negative for itching and rash.  Neurological:  Negative for dizziness, tingling, focal weakness, weakness and headaches.  Endo/Heme/Allergies:  Does not  bruise/bleed easily.  Psychiatric/Behavioral:  Negative for depression. The patient is not nervous/anxious and does not have insomnia.      PAST MEDICAL HISTORY :  Past Medical History:  Diagnosis Date   Anemia 10/2015   Discoid lupus    Gangrene of finger (HCC)    GERD (gastroesophageal reflux disease)    Gout    Hemolytic anemia (HCC)    History of nuclear stress test    a. 09/02/2014: no sig ischemia, GI uptake noted, no sig WMA, EF 48%, no EKG chanes concerning for ischemia, low risk scan     HTN (hypertension)    Normal coronary arteries    a. cardiac cath 12/03/2014: no significant CAD, right dominant system, LVEF >55%, no MR or AS   Paroxysmal atrial flutter (HCC)    a. s/p successful TEE/DCCV on 08/28/2014-->was on xarelto, now on warfarin.   Pulmonary fibrosis (HCC)    a. 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)    a. echo 07/2014: EF 20-25%, cannot exclude atrial flutter,  global HK, possible bicuspid Ao valve, mod reduced RV sys fxn, mild-mod aortic valve sclerosis/calcification, mod TR, mildly elevated RVSP; b. TEE 08/28/2014: EF 30-35%, no intracardic thrombus, mildly dilated LA/RA, mild TR, mild-mod aortic sclerosis w/o stenosis; c. echo 09/2014: EF 55-60%, select images w/ inf HK; d. 10/2015 Echo: EF 60-65%    PAST SURGICAL HISTORY :   Past Surgical History:  Procedure Laterality Date   CARDIAC CATHETERIZATION N/A 12/03/2014   Procedure: Left Heart Cath and Coronary Angiography;  Surgeon: Minna Merritts, MD;  Location: [facility];  Service: Cardiovascular;  Laterality: N/A;   COLONOSCOPY WITH PROPOFOL N/A 11/23/2015   Procedure: COLONOSCOPY WITH PROPOFOL;  Surgeon: [name], MD;  Location: [facility] ENDOSCOPY;  Service: Endoscopy;  Laterality: N/A;   COLONOSCOPY WITH PROPOFOL N/A 05/04/2017   Procedure: COLONOSCOPY WITH PROPOFOL;  Surgeon: [name], MD;  Location: [facility];  Service: Endoscopy;  Laterality: N/A;   ESOPHAGOGASTRODUODENOSCOPY (EGD) WITH PROPOFOL N/A 11/23/2015   Procedure: ESOPHAGOGASTRODUODENOSCOPY (EGD) WITH PROPOFOL;  Surgeon: [name], MD;  Location: [facility] ENDOSCOPY;  Service: Endoscopy;  Laterality: N/A;   HAND SURGERY Left    PERIPHERAL VASCULAR CATHETERIZATION N/A 11/17/2015   Procedure: Upper Extremity Angiography;  Surgeon: Wellington Hampshire, MD;  Location: [facility];  Service: Cardiovascular;  Laterality: N/A;   POLYPECTOMY  05/04/2017   Procedure: POLYPECTOMY;  Surgeon: [name], MD;  Location: [facility];  Service: Endoscopy;;    FAMILY HISTORY :   Family History  Problem Relation Age of Onset   Leukemia Mother     SOCIAL HISTORY:   Social History   Tobacco Use   Smoking status: Former    Packs/day: 1.00    Years: 20.00    Total pack years: 20.00    Types: Cigarettes    Quit date: 08/30/2004    Years since quitting: 17.2    Passive exposure: Past   Smokeless tobacco: Never  Vaping Use   Vaping Use: Never used  Substance Use Topics   Alcohol use: No   Drug use: No    ALLERGIES:  is allergic to allopurinol and probenecid.  MEDICATIONS:  Current Outpatient Medications  Medication Sig Dispense Refill   albuterol (VENTOLIN HFA) 108 (90 Base) MCG/ACT inhaler Inhale into the lungs as needed.     azaTHIOprine (IMURAN) 50 MG tablet Take 50 mg by mouth daily.     diltiazem (CARDIZEM CD) 120 MG 24 hr capsule TAKE 1 CAPSULE EVERY DAY 90 capsule 0   enalapril (VASOTEC) 20 MG tablet TAKE 1 TABLET EVERY DAY 90 tablet 1   febuxostat (ULORIC) 40 MG tablet Take by mouth.     fluticasone (FLONASE) 50 MCG/ACT nasal spray Place into the nose as needed.     fluticasone-salmeterol (ADVAIR) 250-50 MCG/ACT AEPB Inhale into the lungs as needed.     hydroxychloroquine (PLAQUENIL) 200 MG tablet Take 1 tablet by mouth 2 (two) times daily.     ipratropium (ATROVENT) 0.03 % nasal spray Place into the nose as needed.      metoprolol tartrate (LOPRESSOR) 50 MG tablet TAKE 1 AND 1/2 TABLETS TWICE DAILY 270 tablet 3   Multiple Vitamin (MULTI-VITAMINS) TABS Take by mouth.     pantoprazole (PROTONIX) 40 MG tablet Take 40 mg by mouth 2 (two) times daily.     potassium chloride SA (KLOR-CON M) 20 MEQ tablet TAKE 1 TABLET EVERY DAY 90 tablet 3   predniSONE (DELTASONE) 5 MG tablet Take 10 mg by mouth daily with breakfast.     rosuvastatin (CRESTOR) 5 MG tablet Take 1 tablet (5 mg total) by mouth daily. 90 tablet 3   sildenafil (VIAGRA) 50 MG tablet Take 1-2 tablets (50-100 mg total) by mouth daily as needed for erectile dysfunction. 30 tablet 11   tadalafil (CIALIS) 5 MG tablet Take 1 tablet (5 mg total) by mouth daily. May increase to '10mg'$  as needed 30 tablet 1   tamsulosin (FLOMAX) 0.4 MG CAPS capsule Take 1 capsule (0.4 mg total) by mouth daily. 30 capsule 11   torsemide (DEMADEX) 20 MG tablet Take 0.5 tablets (10 mg total) by mouth daily. 45 tablet 3   warfarin (COUMADIN)  5 MG tablet TAKE 1 TABLET (5 MG TOTAL) BY MOUTH DAILY. 90 tablet 1   sildenafil (VIAGRA) 100 MG tablet Take 1 tablet (100 mg total) by mouth daily as needed for erectile dysfunction. (Patient not taking: Reported on 12/06/2021) 30 tablet 2   No current facility-administered medications for this visit.    PHYSICAL EXAMINATION:   BP 120/80 (BP Location: Left Arm, Patient Position: Sitting)   Pulse 69   Temp 97.6 F (36.4 C) (Tympanic)   Resp 16   Wt 252 lb (114.3 kg)   SpO2 100%   BMI 36.16 kg/m   Filed Weights   12/06/21 1116  Weight: 252 lb (114.3 kg)   Physical Exam HENT:     Head: Normocephalic and atraumatic.     Mouth/Throat:     Pharynx: No oropharyngeal exudate.  Eyes:     Pupils: Pupils are equal, round, and reactive to light.  Cardiovascular:     Rate and Rhythm: Normal rate and regular rhythm.  Pulmonary:     Effort: Pulmonary effort is normal. No respiratory distress.     Breath sounds: Normal breath sounds. No  wheezing.  Abdominal:     General: Bowel sounds are normal. There is no distension.     Palpations: Abdomen is soft. There is no mass.     Tenderness: There is no abdominal tenderness. There is no guarding or rebound.  Musculoskeletal:        General: No tenderness. Normal range of motion.     Cervical back: Normal range of motion and neck supple.  Skin:    General: Skin is warm.  Neurological:     Mental Status: He is alert and oriented to person, place, and time.  Psychiatric:        Mood and Affect: Affect normal.     LABORATORY DATA:  I have reviewed the data as listed    Component Value Date/Time   NA 139 12/06/2021 1018   NA 143 04/07/2021 0939   NA 137 08/27/2014 0403   K 3.8 12/06/2021 1018   K 4.1 08/27/2014 0403   CL 107 12/06/2021 1018   CL 108 08/27/2014 0403   CO2 26 12/06/2021 1018   CO2 26 08/27/2014 0403   GLUCOSE 91 12/06/2021 1018   GLUCOSE 94 08/27/2014 0403   BUN 23 12/06/2021 1018   BUN 22 04/07/2021 0939   BUN 16 08/27/2014 0403   CREATININE 1.35 (H) 12/06/2021 1018   CREATININE 0.85 08/27/2014 0403   CALCIUM 8.7 (L) 12/06/2021 1018   CALCIUM 8.6 (L) 08/27/2014 0403   PROT 6.6 04/18/2021 0908   ALBUMIN 4.1 04/18/2021 0908   AST 14 04/18/2021 0908   ALT 16 04/18/2021 0908   ALKPHOS 53 04/18/2021 0908   BILITOT 0.3 04/18/2021 0908   GFRNONAA 59 (L) 12/06/2021 1018   GFRNONAA >60 08/27/2014 0403   GFRAA >60 11/25/2019 0958   GFRAA >60 08/27/2014 0403    No results found for: "SPEP", "UPEP"  Lab Results  Component Value Date   WBC 9.1 12/06/2021   NEUTROABS 6.3 12/06/2021   HGB 12.1 (L) 12/06/2021   HCT 39.8 12/06/2021   MCV 87.1 12/06/2021   PLT 167 12/06/2021      Chemistry      Component Value Date/Time   NA 139 12/06/2021 1018   NA 143 04/07/2021 0939   NA 137 08/27/2014 0403   K 3.8 12/06/2021 1018   K 4.1 08/27/2014 0403   CL 107 12/06/2021 1018  CL 108 08/27/2014 0403   CO2 26 12/06/2021 1018   CO2 26 08/27/2014 0403    BUN 23 12/06/2021 1018   BUN 22 04/07/2021 0939   BUN 16 08/27/2014 0403   CREATININE 1.35 (H) 12/06/2021 1018   CREATININE 0.85 08/27/2014 0403      Component Value Date/Time   CALCIUM 8.7 (L) 12/06/2021 1018   CALCIUM 8.6 (L) 08/27/2014 0403   ALKPHOS 53 04/18/2021 0908   AST 14 04/18/2021 0908   ALT 16 04/18/2021 0908   BILITOT 0.3 04/18/2021 0908       RADIOGRAPHIC STUDIES: I have personally reviewed the radiological images as listed and agreed with the findings in the report. No results found.   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  # Hemoglobin today 12-13 No evidence of hemolysis. STABLE; JAN 2023- Ferritin- 27; iron sat- 21%-recommend gentle iron.   # GFR- 59-monitor for now.  # History of A. fib on Coumadin- STABLE.   # DISPOSITION: # follow up in mid FEB 2024--MD/-CBC/LDH/BMP; iron studies;ferritin--Dr.B    Orders Placed This Encounter  Procedures   CBC with Differential/Platelet    Standing Status:   Future    Standing Expiration Date:   12/07/2022   Lactate dehydrogenase    Standing Status:   Future    Standing Expiration Date:   12/07/2022   Iron and TIBC    Standing Status:   Future    Standing Expiration Date:   12/07/2022   Ferritin    Standing Status:   Future    Standing Expiration Date:   5/46/5035   Basic metabolic panel    Standing Status:   Future    Standing Expiration Date:   12/07/2022   All questions were answered. The patient knows to call the clinic with any problems, questions or concerns.      Cammie Sickle, MD 12/07/2021 7:46 PM

## 2021-12-07 LAB — HAPTOGLOBIN: Haptoglobin: 114 mg/dL (ref 32–363)

## 2021-12-10 ENCOUNTER — Other Ambulatory Visit: Payer: Self-pay | Admitting: Nurse Practitioner

## 2021-12-13 ENCOUNTER — Encounter: Payer: Self-pay | Admitting: Urology

## 2021-12-13 ENCOUNTER — Ambulatory Visit: Payer: Medicare HMO | Admitting: Urology

## 2021-12-13 VITALS — BP 139/80 | HR 89 | Ht 70.0 in | Wt 250.0 lb

## 2021-12-13 DIAGNOSIS — N529 Male erectile dysfunction, unspecified: Secondary | ICD-10-CM

## 2021-12-13 DIAGNOSIS — N138 Other obstructive and reflux uropathy: Secondary | ICD-10-CM | POA: Diagnosis not present

## 2021-12-13 DIAGNOSIS — Z125 Encounter for screening for malignant neoplasm of prostate: Secondary | ICD-10-CM

## 2021-12-13 DIAGNOSIS — N401 Enlarged prostate with lower urinary tract symptoms: Secondary | ICD-10-CM

## 2021-12-13 NOTE — Progress Notes (Signed)
   12/13/2021 8:27 AM   [name] May 29, 1961 [id number]  Reason for visit: Follow up urinary symptoms, ED, prostate nodule, PSA screening, low testosterone  HPI: 61 year old male with a number of comorbidities including obesity with BMI of 36, A-fib on Coumadin, high-dose diuretics, and history of a prostate nodule with a normal prostate MRI and normal PSA.  He previously was scheduled for cystoscopy by Zara Council, PA, but he ultimately deferred.  Prostate MRI showed a 44 g prostate with no suspicious lesions and a normal-appearing bladder.  At our last visit, his primary urinary complaints were urgency and frequency when he takes his torsemide.  He ultimately opted for a trial of Flomax and behavioral strategies were discussed regarding his urination.  He feels the Flomax has made a significant improvement in the urination with improvement in his stream and flow, and some improvement in the urgency/frequency.  Symptoms continue to be associated with torsemide use.  We again reviewed behavioral strategies including weight loss, avoiding bladder irritants, timing of torsemide use, and continuing Flomax.  PVR today is normal at 0 mL.  He also opted for a trial of sildenafil 50 to 100 mg on demand for ED at our last visit.  He has not yet used this medication.  We again reviewed risks and benefits of this medication, and avoiding taking it with big meals.  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  We discussed options including testosterone replacement, Clomid, or behavioral strategies.  He would like to start with behavioral strategies including exercise, weight lifting, weight loss.  Consider repeat testosterone and follow-up.  Continue Flomax and Viagra, has refills for the year RTC 1 year PVR and symptom check, sooner if problems  Billey Co, MD  [facility] [address], [place], [place] [address] [phone]

## 2021-12-15 ENCOUNTER — Other Ambulatory Visit: Payer: Self-pay

## 2021-12-15 DIAGNOSIS — N401 Enlarged prostate with lower urinary tract symptoms: Secondary | ICD-10-CM

## 2021-12-15 DIAGNOSIS — N138 Other obstructive and reflux uropathy: Secondary | ICD-10-CM

## 2021-12-15 MED ORDER — TAMSULOSIN HCL 0.4 MG PO CAPS: 0.4000 mg | ORAL_CAPSULE | Freq: Every day | ORAL | 3 refills | Status: DC

## 2021-12-28 ENCOUNTER — Ambulatory Visit (INDEPENDENT_AMBULATORY_CARE_PROVIDER_SITE_OTHER): Payer: Medicare HMO

## 2021-12-28 DIAGNOSIS — Z5181 Encounter for therapeutic drug level monitoring: Secondary | ICD-10-CM

## 2021-12-28 DIAGNOSIS — I4892 Unspecified atrial flutter: Secondary | ICD-10-CM | POA: Diagnosis not present

## 2021-12-28 LAB — POCT INR: INR: 2.3 (ref 2.0–3.0)

## 2021-12-28 NOTE — Patient Instructions (Signed)
-   CONTINUE 1 tablet every day EXCEPT 1/2 TABLET ON MONDAYS & FRIDAYS - recheck in 8 weeks [facility] [phone] - call if you are put on any antibiotics or steroids

## 2022-01-07 ENCOUNTER — Other Ambulatory Visit: Payer: Self-pay | Admitting: Cardiovascular Disease

## 2022-01-12 ENCOUNTER — Other Ambulatory Visit: Payer: Self-pay | Admitting: Cardiovascular Disease

## 2022-01-12 NOTE — Telephone Encounter (Signed)
Refill request

## 2022-02-17 ENCOUNTER — Telehealth: Payer: Self-pay | Admitting: Cardiovascular Disease

## 2022-02-17 NOTE — Telephone Encounter (Signed)
Pt c/o medication issue:  1. Name of Medication: warfarin (COUMADIN) 5 MG tablet  2. How are you currently taking this medication (dosage and times per day)? As prescribed   3. Are you having a reaction (difficulty breathing--STAT)? No   4. What is your medication issue? Patient is calling stating he was started on the antibiotic doxycycline hyclate 100 mg 1 tablet twice daily. He is wanting to know if adjustments to his coumadin need to be made due to this. Please advise.

## 2022-02-17 NOTE — Telephone Encounter (Signed)
Returned call to the pt and confirmed that he was started on doxy '100mg'$  bid x 10 days. Advised pt that the med can interact with warfarin causing the INR to increase. Advised pt to take 1/2 tablet of warfarin tomorrow the continue normal dose since we are unable to check INR over the weekend and have some leafy veggies on Sunday or Monday to prevent the INR the increasing. Pt will keep Wednesday appt.

## 2022-02-22 ENCOUNTER — Ambulatory Visit: Payer: Medicare HMO | Attending: Cardiovascular Disease

## 2022-02-22 DIAGNOSIS — Z5181 Encounter for therapeutic drug level monitoring: Secondary | ICD-10-CM

## 2022-02-22 DIAGNOSIS — I4892 Unspecified atrial flutter: Secondary | ICD-10-CM | POA: Diagnosis not present

## 2022-02-22 LAB — POCT INR: INR: 2 (ref 2.0–3.0)

## 2022-02-22 NOTE — Patient Instructions (Addendum)
-   CONTINUE 1 tablet every day EXCEPT 1/2 TABLET ON MONDAYS & FRIDAYS;  EAT GREENS EVERY OTHER DAY WHILE ON ANTIBIOTIC. - recheck in 7 weeks [facility] [phone] - call if you are put on any antibiotics or steroids

## 2022-02-23 ENCOUNTER — Other Ambulatory Visit: Payer: Self-pay | Admitting: *Deleted

## 2022-02-23 MED ORDER — TORSEMIDE 20 MG PO TABS: 10.0000 mg | ORAL_TABLET | Freq: Every day | ORAL | 2 refills | Status: DC

## 2022-03-21 ENCOUNTER — Telehealth: Payer: Self-pay | Admitting: Cardiovascular Disease

## 2022-03-21 DIAGNOSIS — D849 Immunodeficiency, unspecified: Secondary | ICD-10-CM | POA: Insufficient documentation

## 2022-03-21 DIAGNOSIS — R7303 Prediabetes: Secondary | ICD-10-CM | POA: Insufficient documentation

## 2022-03-21 NOTE — Telephone Encounter (Signed)
Patient stated he is starting antibiotics and would like to get advice on taking this medication.

## 2022-03-26 LAB — HEMOGLOBIN A1C: Hemoglobin A1C: 5.9

## 2022-03-29 ENCOUNTER — Ambulatory Visit: Payer: Medicare HMO | Attending: Cardiovascular Disease

## 2022-03-29 DIAGNOSIS — I4892 Unspecified atrial flutter: Secondary | ICD-10-CM

## 2022-03-29 DIAGNOSIS — Z5181 Encounter for therapeutic drug level monitoring: Secondary | ICD-10-CM

## 2022-03-29 LAB — POCT INR: INR: 2.2 (ref 2.0–3.0)

## 2022-03-29 NOTE — Patient Instructions (Signed)
-   CONTINUE 1 tablet every day EXCEPT 1/2 TABLET ON MONDAYS & FRIDAYS;   - recheck in 7 weeks [facility] [phone] - call if you are put on any antibiotics or steroids

## 2022-03-30 ENCOUNTER — Telehealth: Payer: Self-pay | Admitting: Cardiovascular Disease

## 2022-03-30 NOTE — Telephone Encounter (Signed)
   Pre-operative Risk Assessment    Patient Name: [name]  DOB: [birth year] MRN: [id number]      Request for Surgical Clearance    Procedure:  left Maxillary Antostomy w/tissue removal, left anterior ethmoidectomy, image guided sinus surgery.  Date of Surgery:  Clearance TBD                                 Surgeon:  Carloyn Manner, MD Surgeon's Group or Practice Name:  [facility] Phone number:  [phone] Fax number:  [phone]   Type of Clearance Requested: stop Coumadin 5 days prior and 5 days post     Type of Anesthesia:  General    Additional requests/questions:    Signed, Maxwell Caul   03/30/2022, 2:55 PM

## 2022-04-05 NOTE — Telephone Encounter (Signed)
Patient with diagnosis of atrial fibriliation on Warfarin for anticoagulation.    Procedure: left Maxillary Antostomy w/tissue removal, left anterior ethmoidectomy, image guided sinus surgery   Date of procedure: TBD   CHA2DS2-VASc Score = 5   This indicates a 7.2% annual risk of stroke. The patient's score is based upon: CHF History: 1 HTN History: 1 Diabetes History: 0 Stroke History: 2 Vascular Disease History: 1 Age Score: 0 Gender Score: 0    CrCl 88 mL/min (adj BW) (based on SrCr 1.1 on 03/10/2022) Platelet count 174 (03/10/2022)   Our problem list does not has CVA/TIA listed. Found CVA on active problem list on one of the [facility] notes from (03/22/2022) also notes from 04/05/2022 mention "CVA - 07/08/2016"   Per office protocol, patient can hold warfarin for 5 days prior to the procedure.   Patient will need bridging with Lovenox (enoxaparin) around procedure. Bridging will be directed by Coumadin clinic.  Will send it to the Dr. Ida Rogue for final input  **This guidance is not considered finalized until pre-operative APP has relayed final recommendations.**

## 2022-04-07 ENCOUNTER — Telehealth: Payer: Self-pay | Admitting: *Deleted

## 2022-04-07 NOTE — Telephone Encounter (Signed)
Left message for tele pre op appt

## 2022-04-07 NOTE — Telephone Encounter (Signed)
Pt called back and has been scheduled for tele pre op appt 04/12/22 @ 2:20. Med rec and consent are done.

## 2022-04-07 NOTE — Telephone Encounter (Signed)
Pt called back and has been scheduled for tele pre op appt 04/12/22 @ 2:20. Med rec and consent are done.     Patient Consent for Virtual Visit        [name] has provided verbal consent on 04/07/2022 for a virtual visit (video or telephone).   CONSENT FOR VIRTUAL VISIT FOR:  [name]  By participating in this virtual visit I agree to the following:  I hereby voluntarily request, consent and authorize [facility] and its employed or contracted physicians, physician assistants, nurse practitioners or other licensed health care professionals (the Practitioner), to provide me with telemedicine health care services (the "Services") as deemed necessary by the treating Practitioner. I acknowledge and consent to receive the Services by the Practitioner via telemedicine. I understand that the telemedicine visit will involve communicating with the Practitioner through live audiovisual communication technology and the disclosure of certain medical information by electronic transmission. I acknowledge that I have been given the opportunity to request an in-person assessment or other available alternative prior to the telemedicine visit and am voluntarily participating in the telemedicine visit.  I understand that I have the right to withhold or withdraw my consent to the use of telemedicine in the course of my care at any time, without affecting my right to future care or treatment, and that the Practitioner or I may terminate the telemedicine visit at any time. I understand that I have the right to inspect all information obtained and/or recorded in the course of the telemedicine visit and may receive copies of available information for a reasonable fee.  I understand that some of the potential risks of receiving the Services via telemedicine include:  Delay or interruption in medical evaluation due to technological equipment failure or disruption; Information  transmitted may not be sufficient (e.g. poor resolution of images) to allow for appropriate medical decision making by the Practitioner; and/or  In rare instances, security protocols could fail, causing a breach of personal health information.  Furthermore, I acknowledge that it is my responsibility to provide information about my medical history, conditions and care that is complete and accurate to the best of my ability. I acknowledge that Practitioner's advice, recommendations, and/or decision may be based on factors not within their control, such as incomplete or inaccurate data provided by me or distortions of diagnostic images or specimens that may result from electronic transmissions. I understand that the practice of medicine is not an exact science and that Practitioner makes no warranties or guarantees regarding treatment outcomes. I acknowledge that a copy of this consent can be made available to me via my patient portal ([facility]), or I can request a printed copy by calling the office of [facility].    I understand that my insurance will be billed for this visit.   I have read or had this consent read to me. I understand the contents of this consent, which adequately explains the benefits and risks of the Services being provided via telemedicine.  I have been provided ample opportunity to ask questions regarding this consent and the Services and have had my questions answered to my satisfaction. I give my informed consent for the services to be provided through the use of telemedicine in my medical care

## 2022-04-07 NOTE — Telephone Encounter (Signed)
Primary Cardiologist:Timothy Rockey Situ, MD   Preoperative team, please contact this patient and set up a phone call appointment for further preoperative risk assessment. Please obtain consent and complete medication review. Thank you for your help.   I confirm that guidance regarding antiplatelet and oral anticoagulation therapy has been completed and, if necessary, noted below.   Emmaline Life, NP-C  04/07/2022, 6:34 AM [address] Office [phone] Fax [phone]

## 2022-04-08 ENCOUNTER — Emergency Department: Payer: Medicare HMO

## 2022-04-08 ENCOUNTER — Other Ambulatory Visit: Payer: Self-pay

## 2022-04-08 ENCOUNTER — Emergency Department
Admission: EM | Admit: 2022-04-08 | Discharge: 2022-04-08 | Disposition: A | Discharge status: Home / Self Care | Payer: Medicare HMO | Attending: Emergency Medicine | Admitting: Emergency Medicine

## 2022-04-08 DIAGNOSIS — R61 Generalized hyperhidrosis: Secondary | ICD-10-CM | POA: Insufficient documentation

## 2022-04-08 DIAGNOSIS — R42 Dizziness and giddiness: Secondary | ICD-10-CM | POA: Diagnosis not present

## 2022-04-08 DIAGNOSIS — Z8679 Personal history of other diseases of the circulatory system: Secondary | ICD-10-CM | POA: Diagnosis not present

## 2022-04-08 DIAGNOSIS — R002 Palpitations: Secondary | ICD-10-CM | POA: Diagnosis not present

## 2022-04-08 DIAGNOSIS — R55 Syncope and collapse: Secondary | ICD-10-CM | POA: Insufficient documentation

## 2022-04-08 LAB — CBC WITH DIFFERENTIAL/PLATELET
Abs Immature Granulocytes: 0.02 10*3/uL (ref 0.00–0.07)
Basophils Absolute: 0 10*3/uL (ref 0.0–0.1)
Basophils Relative: 1 %
Eosinophils Absolute: 0.1 10*3/uL (ref 0.0–0.5)
Eosinophils Relative: 1 %
HCT: 43 % (ref 39.0–52.0)
Hemoglobin: 13.4 g/dL (ref 13.0–17.0)
Immature Granulocytes: 0 %
Lymphocytes Relative: 13 %
Lymphs Abs: 0.9 10*3/uL (ref 0.7–4.0)
MCH: 26.9 pg (ref 26.0–34.0)
MCHC: 31.2 g/dL (ref 30.0–36.0)
MCV: 86.2 fL (ref 80.0–100.0)
Monocytes Absolute: 0.9 10*3/uL (ref 0.1–1.0)
Monocytes Relative: 13 %
Neutro Abs: 4.9 10*3/uL (ref 1.7–7.7)
Neutrophils Relative %: 72 %
Platelets: 198 10*3/uL (ref 150–400)
RBC: 4.99 MIL/uL (ref 4.22–5.81)
RDW: 14.6 % (ref 11.5–15.5)
WBC: 6.8 10*3/uL (ref 4.0–10.5)
nRBC: 0 % (ref 0.0–0.2)

## 2022-04-08 LAB — BASIC METABOLIC PANEL
Anion gap: 10 (ref 5–15)
BUN: 20 mg/dL (ref 8–23)
CO2: 21 mmol/L — ABNORMAL LOW (ref 22–32)
Calcium: 9 mg/dL (ref 8.9–10.3)
Chloride: 109 mmol/L (ref 98–111)
Creatinine, Ser: 1.25 mg/dL — ABNORMAL HIGH (ref 0.61–1.24)
GFR, Estimated: >60 mL/min (ref >60)
Glucose, Bld: 105 mg/dL — ABNORMAL HIGH (ref 70–99)
Potassium: 3.6 mmol/L (ref 3.5–5.1)
Sodium: 140 mmol/L (ref 135–145)

## 2022-04-08 LAB — TROPONIN I (HIGH SENSITIVITY): Troponin I (High Sensitivity): 17 ng/L (ref <18)

## 2022-04-08 NOTE — ED Triage Notes (Signed)
Pt was at [place] with his wife when he had a sudden onset of chest pain and dizziness. Pt felt as he was going to "fall out", He made it to his car. Pt continued to go to another store. Pt then decided to go to the hospital. He was shopping at [place] then decide to call 911. Pt advised his HR has been racing all day. Pt has HX of AFIB. Pt took extra of his prescribed AFIB meds at home this morning. Pt is currently in no acute distress.

## 2022-04-08 NOTE — ED Provider Triage Note (Signed)
Emergency Medicine Provider Triage Evaluation Note  [name] , a 61 y.o. male  was evaluated in triage.  Pt complains of chest pain that began while at [facility]. Patient was waiting in car, but had to use the bathroom and when he was walking he developed dizziness and had to lean against a pole. Reports his heart has been racing all day. Has been taking more of his afib meds than usual because of the feeling of palpitations and reports his cardiologist said this was ok. Reports "weird feeling in my chest." No radiation.     Review of Systems  Positive: Chest pain, dizziness Negative: Sob, fever  Physical Exam  BP 114/77   Pulse 96   Temp 97.8 F (36.6 C) (Oral)   Resp 20   Ht '5\' 10"'$  (1.778 m)   Wt 113.4 kg   SpO2 94%   BMI 35.87 kg/m  Gen:   Awake, no distress   Resp:  Normal effort  MSK:   Moves extremities without difficulty  Other:    Medical Decision Making  Medically screening exam initiated at 5:59 PM.  Appropriate orders placed.  [name] was informed that the remainder of the evaluation will be completed by another provider, this initial triage assessment does not replace that evaluation, and the importance of remaining in the ED until their evaluation is complete.     Devin Old, PA-C 04/08/22 1803

## 2022-04-08 NOTE — ED Notes (Signed)
DC instructions given verbally and in writing, understanding voiced, signature obtained.  Pt left in stable condition with spouse.

## 2022-04-08 NOTE — ED Provider Notes (Signed)
[facility] Provider Note   Event Date/Time   First MD Initiated Contact with Patient 04/08/22 2209     (approximate) History  Chest Pain  HPI [name] is a 61 y.o. male with a stated past medical history of paroxysmal atrial fibrillation who presents for an episode of near syncope today as well as palpitations and diaphoresis.  Patient states that when he woke up this morning he noticed his heart rate to be elevated to 101 and therefore took multiple extra doses of his home metoprolol as instructed by his cardiologist, Dr. Rockey Situ.  Patient states that despite taking these extra doses of medication he continued to experience palpitations while he was running errands.  Patient states that he got up to go inside to the store he felt acutely worse in terms of lightheadedness, diaphoresis, palpitations.  Patient states that he felt like he was going to lose consciousness and therefore called EMS for transportation to the emergency department.  Patient states that the symptoms resolved over approximately 1 hour of his emergency department course and he denies any palpitations, lightheadedness, or any other complaints at this time. ROS: Patient currently denies any vision changes, tinnitus, difficulty speaking, facial droop, sore throat, chest pain, shortness of breath, abdominal pain, nausea/vomiting/diarrhea, dysuria, or weakness/numbness/paresthesias in any extremity   Physical Exam  Triage Vital Signs: ED Triage Vitals  Enc Vitals Group     BP 04/08/22 1752 114/77     Pulse Rate 04/08/22 1752 96     Resp 04/08/22 1752 20     Temp 04/08/22 1752 97.8 F (36.6 C)     Temp Source 04/08/22 1752 Oral     SpO2 04/08/22 1752 94 %     Weight 04/08/22 1752 250 lb (113.4 kg)     Height 04/08/22 1752 '5\' 10"'$  (1.778 m)     Head Circumference --      Peak Flow --      Pain Score 04/08/22 1802 6     Pain Loc --      Pain Edu? --      Excl. in [place]? --    Most  recent vital signs: Vitals:   04/08/22 2253 04/08/22 2256  BP:  112/70  Pulse: 67   Resp: 18   Temp: 98.2 F (36.8 C)   SpO2: 97%    General: Awake, oriented x4. CV:  Good peripheral perfusion.  Resp:  Normal effort.  Abd:  No distention.  Other:  Middle-aged overweight African-American male laying in bed in no acute distress ED Results / Procedures / Treatments  Labs (all labs ordered are listed, but only abnormal results are displayed) Labs Reviewed  BASIC METABOLIC PANEL - Abnormal; Notable for the following components:      Result Value   CO2 21 (*)    Glucose, Bld 105 (*)    Creatinine, Ser 1.25 (*)    All other components within normal limits  CBC WITH DIFFERENTIAL/PLATELET  TROPONIN I (HIGH SENSITIVITY)  TROPONIN I (HIGH SENSITIVITY)   EKG ED ECG REPORT I, Naaman Plummer, the attending physician, personally viewed and interpreted this ECG. Date: 04/08/2022 EKG Time: 1757 Rate: 107 Rhythm: Tachycardic sinus rhythm QRS Axis: normal Intervals: Bifascicular block ST/T Wave abnormalities: normal Narrative Interpretation: Tachycardic sinus rhythm with bifascicular block.  No evidence of acute ischemia RADIOLOGY ED MD interpretation: 2 view chest x-ray interpreted by me shows no evidence of acute abnormalities including no pneumonia, pneumothorax, or widened mediastinum -Agree with  radiology assessment Official radiology report(s): DG Chest 2 View  Result Date: 04/08/2022 CLINICAL DATA:  Chest pain. Also with dizziness. Heart racing all day. EXAM: CHEST - 2 VIEW COMPARISON:  11/27/2019.  CT, 12/02/2021. FINDINGS: Cardiac silhouette is mildly enlarged. No mediastinal or hilar masses. No evidence of adenopathy. Irregular interstitial thickening is noted, mostly in the lower lungs extending to the mid lungs, doubt significant change. No lung consolidation. No pleural effusion or pneumothorax. Skeletal structures are intact. IMPRESSION: 1. No acute cardiopulmonary disease.  2. Findings of interstitial lung disease without significant change compared to the prior studies, particularly the more recent prior chest CT. Electronically Signed   By: Lajean Manes M.D.   On: 04/08/2022 18:39   PROCEDURES: Critical Care performed: No .1-3 Lead EKG Interpretation  Performed by: Naaman Plummer, MD Authorized by: Naaman Plummer, MD     Interpretation: normal     ECG rate:  67   ECG rate assessment: normal     Rhythm: sinus rhythm     Ectopy: none     Conduction: normal    MEDICATIONS ORDERED IN ED: Medications - No data to display IMPRESSION / MDM / [facility] / ED COURSE  I reviewed the triage vital signs and the nursing notes.                             The patient is on the cardiac monitor to evaluate for evidence of arrhythmia and/or significant heart rate changes. Patient's presentation is most consistent with acute presentation with potential threat to life or bodily function. Patient presents with complaints of syncope/presyncope ED Workup:  CBC, BMP, Troponin, BNP, ECG, CXR Differential diagnosis includes HF, ICH, seizure, stroke, HOCM, ACS, aortic dissection, malignant arrhythmia, or GI bleed. Findings: No evidence of acute laboratory abnormalities.  Troponin negative x1 EKG: No e/o STEMI. No evidence of Brugadas sign, delta wave, epsilon wave, significantly prolonged QTc, or malignant arrhythmia.  Patient's lightheadedness is likely secondary to his paroxysmal atrial fibrillation.  Patient was encouraged to follow-up with his cardiologist, Dr. Rockey Situ within the next 1-3 days for further evaluation and management  Disposition: Discharge. Patient is at baseline at this time. Return precautions expressed and understood in person. Advised follow up with primary care provider or clinic physician in next 24 hours.   FINAL CLINICAL IMPRESSION(S) / ED DIAGNOSES   Final diagnoses:  Palpitations  History of atrial fibrillation  Episodic  lightheadedness   Rx / DC Orders   ED Discharge Orders          Ordered    Ambulatory referral to Cardiology       Comments: If you have not heard from the Cardiology office within the next 72 hours please call [phone].   04/08/22 2248           Note:  This document was prepared using Dragon voice recognition software and may include unintentional dictation errors.   Naaman Plummer, MD 04/08/22 [phone]

## 2022-04-08 NOTE — ED Notes (Signed)
FIRST NURSE: Pt to ER via EMS with sudden onset 9/10 CP described as ache.  Also reported dizziness.  Symptoms  have subsided.  '324mg'$  ASA #18ga R AC, 168/103, 100, 94%

## 2022-04-10 NOTE — Progress Notes (Unsigned)
Cardiology Office Note    Date:  04/11/2022   ID:  [name], DOB 1960-07-24, MRN [id number]  PCP:  Ashley Akin, MD  Cardiologist:  Ida Rogue, MD  Electrophysiologist:  None   Chief Complaint: ED follow-up  History of Present Illness:   [name] is a 61 y.o. male with history of atrial flutter on warfarin secondary to cost with tachycardia mediated cardiomyopathy with subsequent improvement in LV systolic function, chronic hypoxic respiratory failure on supplemental oxygen pulmonary sarcoidosis and fibrosis, discoid lupus, rheumatoid arthritis, HTN, anemia, and GERD who presents for ED follow-up as outlined below.  He was initially found to be in atrial flutter in 2016, at which time his EF was 20 to 25%.  Ischemic evaluation was negative.  He subsequently underwent TEE guided DCCV in 08/2014.  TEE at that time showed an EF of 30 to 35% with mild to moderate aortic valve sclerosis without evidence of stenosis or (tricuspid aortic valve).  Outpatient cardiac monitoring showed sinus rhythm with frequent PVCs and no further evidence of atrial flutter.  Following cardioversion, echo in 09/2014 showed an EF of 55 to 60%.   LHC in 11/2014 showed no significant CAD with an EF greater than 55%.  He was admitted to the hospital on 10/2015 with weakness and fall with a hemoglobin of 5.5 requiring multiple units of PRBC.  GI evaluation was unrevealing.  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  Most recent echo from 10/2015 showed an EF of 60 to 65%, no regional wall motion abnormalities, normal LV diastolic function parameters, normal RV systolic function and PASP.  During the study, frequent PACs and PVCs were noted.  He was last seen in our office in 06/2021 and was without symptoms of angina or decompensation.  He had been treated with 2 separate antibiotics for approximately 2 weeks leading up to his below ED visit for URI with cough  productive of green sputum.  With his URI, he noted an increase in fatigue.  He was seen in the ED on 04/08/2022 near syncope and palpitations.  Upon waking up that morning, he noted his heart rate was elevated around 101 bpm.  His elevated heart rate persisted despite his usual 75 mg of metoprolol.  In this setting, he took 2 additional 50 mg metoprolol.  While in [place], he developed sudden onset of palpitations with associated near syncope and sweating.  There was some slight increase in his underlying shortness of breath.  No frank chest pain.  No syncope.  EMS was called where he reports having had stable to elevated BP.  Symptoms subsequently resolved in the ED.  Vitals in the ED showed a BP 114/77, heart rate 96 bpm, respirations 20, afebrile 4% on room air.  High-sensitivity troponin negative.  EKG shows sinus tachycardia, 107 bpm, PACs, and a bifascicular block.  Chest x-ray was without acute cardiopulmonary process with findings consistent with interstitial lung disease without significant change when compared to prior studies.  Outpatient follow-up was recommended.  Since the above, he has not had any further tachypalpitations or near syncope.  He reports today is the first day where he is beginning to feel like he is trending back to his baseline.  He notes chronic mild lower extremity swelling.  No progressive orthopnea.  No angina or progressive dyspnea.  He remains adherent to his warfarin without symptoms concerning for bleeding.   Labs independently reviewed: 03/2022 - Hgb 13.4, PLT 198, potassium 3.6,  BUN 20, serum creatinine 1.25, INR 2.2 02/2022 - albumin 4.1, AST/ALT normal, A1c 5.9 07/2021 - TC 173, TG 100, HDL 44, LDL 109 07/2014 - TSH normal  Past Medical History:  Diagnosis Date   Anemia 10/2015   Discoid lupus    Gangrene of finger (HCC)    GERD (gastroesophageal reflux disease)    Gout    Hemolytic anemia ([place])    History of nuclear stress test    a. 09/02/2014: no sig  ischemia, GI uptake noted, no sig WMA, EF 48%, no EKG chanes concerning for ischemia, low risk scan     HTN (hypertension)    Normal coronary arteries    a. cardiac cath 12/03/2014: no significant CAD, right dominant system, LVEF >55%, no MR or AS   Paroxysmal atrial flutter (HCC)    a. s/p successful TEE/DCCV on 08/28/2014-->was on xarelto, now on warfarin.   Pulmonary fibrosis (HCC)    a. not on home oxygen   Pulmonary sarcoidosis (HCC)    RA (rheumatoid arthritis) ([place])    Tachycardia-induced cardiomyopathy ([place])    a. echo 07/2014: EF 20-25%, cannot exclude atrial flutter,  global HK, possible bicuspid Ao valve, mod reduced RV sys fxn, mild-mod aortic valve sclerosis/calcification, mod TR, mildly elevated RVSP; b. TEE 08/28/2014: EF 30-35%, no intracardic thrombus, mildly dilated LA/RA, mild TR, mild-mod aortic sclerosis w/o stenosis; c. echo 09/2014: EF 55-60%, select images w/ inf HK; d. 10/2015 Echo: EF 60-65%    Past Surgical History:  Procedure Laterality Date   CARDIAC CATHETERIZATION N/A 12/03/2014   Procedure: Left Heart Cath and Coronary Angiography;  Surgeon: Minna Merritts, MD;  Location: [facility];  Service: Cardiovascular;  Laterality: N/A;   COLONOSCOPY WITH PROPOFOL N/A 11/23/2015   Procedure: COLONOSCOPY WITH PROPOFOL;  Surgeon: [name], MD;  Location: [facility];  Service: Endoscopy;  Laterality: N/A;   COLONOSCOPY WITH PROPOFOL N/A 05/04/2017   Procedure: COLONOSCOPY WITH PROPOFOL;  Surgeon: [name], MD;  Location: [facility];  Service: Endoscopy;  Laterality: N/A;   ESOPHAGOGASTRODUODENOSCOPY (EGD) WITH PROPOFOL N/A 11/23/2015   Procedure: ESOPHAGOGASTRODUODENOSCOPY (EGD) WITH PROPOFOL;  Surgeon: [name], MD;  Location: [facility];  Service: Endoscopy;  Laterality: N/A;   HAND SURGERY Left    PERIPHERAL VASCULAR CATHETERIZATION N/A 11/17/2015   Procedure: Upper Extremity Angiography;  Surgeon: Wellington Hampshire, MD;  Location: [facility];  Service: Cardiovascular;  Laterality: N/A;   POLYPECTOMY  05/04/2017   Procedure: POLYPECTOMY;  Surgeon: [name], MD;  Location: [facility];  Service: Endoscopy;;    Current Medications: Current Meds  Medication Sig   albuterol (VENTOLIN HFA) 108 (90 Base) MCG/ACT inhaler Inhale into the lungs as needed.   atorvastatin (LIPITOR) 80 MG tablet Take 80 mg by mouth daily.   azaTHIOprine (IMURAN) 50 MG tablet Take 50 mg by mouth daily.   colchicine 0.6 MG tablet Take by mouth.   [employer] NASAL ALLERGY SPRAY 55 MCG/ACT AERO nasal inhaler SMARTSIG:2 Spray(s) Both Nares Daily   diltiazem (CARDIZEM CD) 120 MG 24 hr capsule TAKE 1 CAPSULE EVERY DAY   enalapril (VASOTEC) 20 MG tablet TAKE 1 TABLET EVERY DAY   febuxostat (ULORIC) 40 MG tablet Take by mouth.   fluticasone (FLONASE) 50 MCG/ACT nasal spray Place into the nose as needed.   fluticasone-salmeterol (ADVAIR) 250-50 MCG/ACT AEPB Inhale into the lungs daily.   hydroxychloroquine (PLAQUENIL) 200 MG tablet Take 1 tablet by mouth 2 (two) times daily.   metoprolol tartrate (  LOPRESSOR) 50 MG tablet TAKE 1 AND 1/2 TABLETS TWICE DAILY   Multiple Vitamin (MULTI-VITAMINS) TABS Take by mouth.   mycophenolate (CELLCEPT) 500 MG tablet Take 2 tablets by mouth 2 (two) times daily.   pantoprazole (PROTONIX) 40 MG tablet Take 40 mg by mouth 2 (two) times daily.   potassium chloride SA (KLOR-CON M) 20 MEQ tablet TAKE 1 TABLET EVERY DAY   predniSONE (DELTASONE) 5 MG tablet Take 5 mg by mouth daily with breakfast.   sildenafil (VIAGRA) 50 MG tablet Take 1-2 tablets (50-100 mg total) by mouth daily as needed for erectile dysfunction.   tamsulosin (FLOMAX) 0.4 MG CAPS capsule Take 1 capsule (0.4 mg total) by mouth daily.   torsemide (DEMADEX) 20 MG tablet Take 0.5 tablets (10 mg total) by mouth daily.   warfarin (COUMADIN) 5 MG tablet TAKE 1 TABLET (5 MG TOTAL) BY MOUTH DAILY.    Allergies:   Allopurinol and Probenecid   Social History    Socioeconomic History   Marital status: Married    Spouse name: Not on file   Number of children: Not on file   Years of education: Not on file   Highest education level: Not on file  Occupational History   Not on file  Tobacco Use   Smoking status: Former    Packs/day: 1.00    Years: 20.00    Total pack years: 20.00    Types: Cigarettes    Quit date: 08/30/2004    Years since quitting: 17.6    Passive exposure: Past   Smokeless tobacco: Never  Vaping Use   Vaping Use: Never used  Substance and Sexual Activity   Alcohol use: No   Drug use: No   Sexual activity: Yes    Birth control/protection: None  Other Topics Concern   Not on file  Social History Narrative   Not on file   Social Determinants of Health   Financial Resource Strain: Not on file  Food Insecurity: Not on file  Transportation Needs: Not on file  Physical Activity: Not on file  Stress: Not on file  Social Connections: Not on file     Family History:  The patient's family history includes Leukemia in his mother.  ROS:   12-point review of systems is negative unless otherwise noted in the HPI   EKGs/Labs/Other Studies Reviewed:    Studies reviewed were summarized above. The additional studies were reviewed today:  2D echo 11/22/2015: - Left ventricle: The cavity size was normal. Systolic function was    normal. The estimated ejection fraction was in the range of 60%    to 65%. Wall motion was normal; there were no regional wall    motion abnormalities. Left ventricular diastolic function    parameters were normal.  - Left atrium: The atrium was at the upper limits of normal in    size.  - Right ventricle: Systolic function was normal.  - Pulmonary arteries: Systolic pressure was within the normal    range.   Impressions:   - Challenging image quality. Frequent APCs and PVCs.  __________  Upper extremity angiography 11/17/2015: 1. 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  2. Beading appearance of left ulnar and radial arteries suggestive of a systemic process (No improvement with NTG). ? Vasculitis.    Recommendations: Although the ulnar artery is occluded distally, the fingers are getting normal flow via the radial artery with an  intact hand arch. Thus, there is no benefit of opening the ulnar artery which is very small anyway with what seems to be a systemic process. ( I reviewed the angiogram with Dr. Trula Slade for a second opinion) . Continue wound care. Recommend rheumatology consult. __________  Upper extremity arterial duplex 11/04/2015: Summary:  The ulnar artery appears small with atypical waveform. Subclavian,  axillary, brachial, and radial arteries of the left upper extremity  appear patent with normal waveforms. Arterial flow noted in left  palmar arch.  __________  Outpatient cardiac monitoring 11/2014: Shows episodes of PVCs in a bigeminal manner. On 7/20, 7/22, 7/25, 12/27/14 He was asymptomatic.  Otherwise no other significant arrhythmia __________  [facility] 12/03/2014: Right dominant coronary arterial system No significant coronary artery disease Normal LV gram, ejection fraction estimated at greater than 55% No significant MR, aortic valve stenosis   Etiology of his chest pain is likely atypical in nature Shortness of breath likely secondary to underlying pulmonary fibrosis Medical management recommended __________  2D echo 10/05/2014: - Left ventricle: The cavity size was normal. Systolic function was    normal. The estimated ejection fraction was in the range of 55%    to 60%. Select images suggestive of hypokinesis of the inferior    and posterior myocardium. Left ventricular diastolic function    parameters were normal.  - Left atrium: The atrium was normal in size.  - Right ventricle: Systolic function was normal.  - Pulmonary arteries: Systolic pressure was within the normal     range.  __________  EKG:  EKG is ordered today given recent ED EKG and with planned Zio patch.    Recent Labs: 04/18/2021: ALT 16 11/24/2021: B Natriuretic Peptide 40.0 04/08/2022: BUN 20; Creatinine, Ser 1.25; Hemoglobin 13.4; Platelets 198; Potassium 3.6; Sodium 140  Recent Lipid Panel    Component Value Date/Time   CHOL 173 04/18/2021 0908   CHOL 88 08/25/2014 0110   TRIG 187 (H) 04/18/2021 0908   TRIG 124 08/25/2014 0110   HDL 49 04/18/2021 0908   HDL 24 (L) 08/25/2014 0110   CHOLHDL 3.5 04/18/2021 0908   VLDL 25 08/25/2014 0110   LDLCALC 92 04/18/2021 0908   LDLCALC 39 08/25/2014 0110    PHYSICAL EXAM:    VS:  BP (!) 142/102 (BP Location: Left Arm, Patient Position: Sitting, Cuff Size: Normal)   Pulse 93   Ht '5\' 10"'$  (1.778 m)   Wt 249 lb 12.8 oz (113.3 kg)   SpO2 96%   BMI 35.84 kg/m   BMI: Body mass index is 35.84 kg/m.  Physical Exam Vitals reviewed.  Constitutional:      Appearance: He is well-developed.  HENT:     Head: Normocephalic and atraumatic.  Eyes:     General:        Right eye: No discharge.        Left eye: No discharge.  Neck:     Vascular: No JVD.  Cardiovascular:     Rate and Rhythm: Normal rate and regular rhythm.     Heart sounds: Normal heart sounds, S1 normal and S2 normal. Heart sounds not distant. No midsystolic click and no opening snap. No murmur heard.    No friction rub.  Pulmonary:     Effort: Pulmonary effort is normal. No respiratory distress.     Breath sounds: Normal breath sounds. No decreased breath sounds, wheezing or rales.  Chest:     Chest wall: No tenderness.  Abdominal:     General:  There is no distension.  Musculoskeletal:     Cervical back: Normal range of motion.     Comments: Mild bilateral lower extremity edema.  Skin:    General: Skin is warm and dry.     Nails: There is no clubbing.  Neurological:     Mental Status: He is alert and oriented to person, place, and time.  Psychiatric:        Speech:  Speech normal.        Behavior: Behavior normal.        Thought Content: Thought content normal.        Judgment: Judgment normal.     Wt Readings from Last 3 Encounters:  04/11/22 249 lb 12.8 oz (113.3 kg)  04/08/22 250 lb (113.4 kg)  12/13/21 250 lb (113.4 kg)     ASSESSMENT & PLAN:   Palpitations/paroxysmal atrial flutter: Maintaining sinus rhythm with Lopressor and Cardizem CD.  Place Zio patch.  CHA2DS2-VASc at least 3 (CHF, HTN, vascular disease).  He remains on warfarin secondary to financial constraints.  Lightheadedness: Possibly in the setting of his tachypalpitations, though cannot exclude some transient hypotension in the setting of taking 2 additional metoprolol on the morning of his symptoms for a total of 175 mg that morning.  Zio patch as outlined above.  Hemodynamically stable in the office today.  History of tachy-mediated cardiomyopathy: He appears euvolemic and well compensated.  Most recent echo from 2017 again demonstrated preserved LV systolic function.  Update echo to evaluate for any new structural abnormalities/redevelopment of cardiomyopathy.  He remains on Lopressor and low-dose torsemide.  Frequent PVCs: Zio patch as outlined above.  Chronic hypoxic respiratory failure with pulmonary sarcoidosis and fibrosis: Remains on supplemental oxygen.  Follow-up with pulmonology as directed.  Elevated blood pressure: Blood pressure is typically reasonably controlled.  Likely in the setting of his recent URI and stress surrounding his visit today.  He remains on a diltiazem and metoprolol.  Continue to monitor.    Disposition: F/u with Dr. Rockey Situ or an APP in 2 months.   Medication Adjustments/Labs and Tests Ordered: Current medicines are reviewed at length with the patient today.  Concerns regarding medicines are outlined above. Medication changes, Labs and Tests ordered today are summarized above and listed in the Patient Instructions accessible in Encounters.    Signed, Christell Faith, PA-C 04/11/2022 1:58 PM     [address] [place], [place] [id number] [phone]

## 2022-04-11 ENCOUNTER — Ambulatory Visit (INDEPENDENT_AMBULATORY_CARE_PROVIDER_SITE_OTHER): Payer: Medicare HMO

## 2022-04-11 ENCOUNTER — Ambulatory Visit: Payer: Medicare HMO | Attending: Physician Assistant | Admitting: Physician Assistant

## 2022-04-11 ENCOUNTER — Encounter: Payer: Self-pay | Admitting: Physician Assistant

## 2022-04-11 VITALS — BP 142/102 | HR 93 | Ht 70.0 in | Wt 249.8 lb

## 2022-04-11 DIAGNOSIS — R42 Dizziness and giddiness: Secondary | ICD-10-CM | POA: Diagnosis not present

## 2022-04-11 DIAGNOSIS — I4892 Unspecified atrial flutter: Secondary | ICD-10-CM

## 2022-04-11 DIAGNOSIS — N179 Acute kidney failure, unspecified: Secondary | ICD-10-CM

## 2022-04-11 DIAGNOSIS — R002 Palpitations: Secondary | ICD-10-CM

## 2022-04-11 DIAGNOSIS — I43 Cardiomyopathy in diseases classified elsewhere: Secondary | ICD-10-CM

## 2022-04-11 DIAGNOSIS — J8410 Pulmonary fibrosis, unspecified: Secondary | ICD-10-CM

## 2022-04-11 DIAGNOSIS — I428 Other cardiomyopathies: Secondary | ICD-10-CM

## 2022-04-11 DIAGNOSIS — J9611 Chronic respiratory failure with hypoxia: Secondary | ICD-10-CM

## 2022-04-11 DIAGNOSIS — I493 Ventricular premature depolarization: Secondary | ICD-10-CM

## 2022-04-11 DIAGNOSIS — R000 Tachycardia, unspecified: Secondary | ICD-10-CM | POA: Diagnosis not present

## 2022-04-11 NOTE — Patient Instructions (Signed)
Medication Instructions:  No changes at this time.   *If you need a refill on your cardiac medications before your next appointment, please call your pharmacy*   Lab Work: None  If you have labs (blood work) drawn today and your tests are completely normal, you will receive your results only by: [place] (if you have MyChart) OR A paper copy in the mail If you have any lab test that is abnormal or we need to change your treatment, we will call you to review the results.   Testing/Procedures: Your physician has requested that you have an echocardiogram. Echocardiography is a painless test that uses sound waves to create images of your heart. It provides your doctor with information about the size and shape of your heart and how well your heart's [place] and valves are working. This procedure takes approximately one hour. There are no restrictions for this procedure. Please do NOT wear cologne, perfume, aftershave, or lotions (deodorant is allowed). Please arrive 15 minutes prior to your appointment time.  Your physician has recommended that you wear a Zio monitor.   This monitor is a medical device that records the heart's electrical activity. Doctors most often use these monitors to diagnose arrhythmias. Arrhythmias are problems with the speed or rhythm of the heartbeat. The monitor is a small device applied to your chest. You can wear one while you do your normal daily activities. While wearing this monitor if you have any symptoms to push the button and record what you felt. Once you have worn this monitor for the period of time provider prescribed (Usually 14 days), you will return the monitor device in the postage paid box. Once it is returned they will download the data collected and provide [place] with a report which the provider will then review and we will call you with those results. Important tips:  Avoid showering during the first 24 hours of wearing the monitor. Avoid  excessive sweating to help maximize wear time. Do not submerge the device, no hot tubs, and no swimming pools. Keep any lotions or oils away from the patch. After 24 hours you may shower with the patch on. Take brief showers with your back facing the shower head.  Do not remove patch once it has been placed because that will interrupt data and decrease adhesive wear time. Push the button when you have any symptoms and write down what you were feeling. Once you have completed wearing your monitor, remove and place into box which has postage paid and place in your outgoing mailbox.  If for some reason you have misplaced your box then call our office and we can provide another box and/or mail it off for you.      Follow-Up: At [facility], you and your health needs are our priority.  As part of our continuing mission to provide you with exceptional heart care, we have created designated Provider Care Teams.  These Care Teams include your primary Cardiologist (physician) and Advanced Practice Providers (APPs -  Physician Assistants and Nurse Practitioners) who all work together to provide you with the care you need, when you need it.   Your next appointment:   2 month(s)  The format for your next appointment:   In Person  Provider:   Ida Rogue, MD or Christell Faith, PA-C        Important Information About Sugar

## 2022-04-12 ENCOUNTER — Ambulatory Visit: Payer: Medicare HMO | Attending: Internal Medicine | Admitting: Physician Assistant

## 2022-04-12 DIAGNOSIS — Z01810 Encounter for preprocedural cardiovascular examination: Secondary | ICD-10-CM | POA: Diagnosis not present

## 2022-04-12 NOTE — Progress Notes (Signed)
Virtual Visit via Telephone Note   Because of Aquila Delaughter Henes's co-morbid illnesses, he is at least at moderate risk for complications without adequate follow up.  This format is felt to be most appropriate for this patient at this time.  The patient did not have access to video technology/had technical difficulties with video requiring transitioning to audio format only (telephone).  All issues noted in this document were discussed and addressed.  No physical exam could be performed with this format.  Please refer to the patient's chart for his consent to telehealth for [facility].  Evaluation Performed:  Preoperative cardiovascular risk assessment _____________   Date:  04/12/2022   Patient ID:  [name], DOB [birth year], MRN [id number] Patient Location:  Home Provider location:   Office  Primary Care Provider:  Ashley Akin, MD Primary Cardiologist:  Ida Rogue, MD  Chief Complaint / Patient Profile   61 y.o. y/o male with a h/o atrial flutter on warfarin secondary to cost with tachycardia mediated cardiomyopathy with subsequent improvement in LV systolic function, chronic hypoxic respiratory failure on supplemental oxygen, pulmonary sarcoidosis and fibrosis, discoid lupus, rheumatoid arthritis, hypertension, anemia, and GERD who is pending left maxillary antostomy with tissue removal and left anterior ethmoidectomy, image guided sinus surgery presents today for telephonic preoperative cardiovascular risk assessment.  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 09/02/2014: no sig ischemia, GI uptake noted, no sig WMA, EF 48%, no EKG chanes concerning for ischemia, low risk scan     HTN (hypertension)    Normal coronary arteries    a. cardiac cath  12/03/2014: no significant CAD, right dominant system, LVEF >55%, no MR or AS   Paroxysmal atrial flutter (HCC)    a. s/p successful TEE/DCCV on 08/28/2014-->was on xarelto, now on warfarin.   Pulmonary fibrosis (HCC)    a. not on home oxygen   Pulmonary sarcoidosis (HCC)    RA (rheumatoid arthritis) ([place])    Tachycardia-induced cardiomyopathy ([place])    a. echo 07/2014: EF 20-25%, cannot exclude atrial flutter,  global HK, possible bicuspid Ao valve, mod reduced RV sys fxn, mild-mod aortic valve sclerosis/calcification, mod TR, mildly elevated RVSP; b. TEE 08/28/2014: EF 30-35%, no intracardic thrombus, mildly dilated LA/RA, mild TR, mild-mod aortic sclerosis w/o stenosis; c. echo 09/2014: EF 55-60%, select images w/ inf HK; d. 10/2015 Echo: EF 60-65%   Past Surgical History:  Procedure Laterality Date   CARDIAC CATHETERIZATION N/A 12/03/2014   Procedure: Left Heart Cath and Coronary Angiography;  Surgeon: Minna Merritts, MD;  Location: [place] [facility];  Service: Cardiovascular;  Laterality: N/A;   COLONOSCOPY WITH PROPOFOL N/A 11/23/2015   Procedure: COLONOSCOPY WITH PROPOFOL;  Surgeon: [name], MD;  Location: [facility];  Service: Endoscopy;  Laterality: N/A;   COLONOSCOPY WITH PROPOFOL N/A 05/04/2017   Procedure: COLONOSCOPY WITH PROPOFOL;  Surgeon: [name], MD;  Location: [place];  Service: Endoscopy;  Laterality: N/A;   ESOPHAGOGASTRODUODENOSCOPY (EGD) WITH PROPOFOL N/A 11/23/2015   Procedure: ESOPHAGOGASTRODUODENOSCOPY (EGD) WITH PROPOFOL;  Surgeon: [name], MD;  Location: [facility];  Service: Endoscopy;  Laterality: N/A;   HAND SURGERY Left    PERIPHERAL VASCULAR CATHETERIZATION N/A 11/17/2015   Procedure:  Upper Extremity Angiography;  Surgeon: Wellington Hampshire, MD;  Location: [place] [facility];  Service: Cardiovascular;  Laterality: N/A;   POLYPECTOMY  05/04/2017   Procedure: POLYPECTOMY;  Surgeon: [name], MD;  Location: [place];  Service:  Endoscopy;;    Allergies  Allergies  Allergen Reactions   Allopurinol Diarrhea   Probenecid Other (See Comments) and Nausea Only    Other reaction(s): Headache    History of Present Illness    Blade Scheff is a 61 y.o. male who presents via audio/video conferencing for a telehealth visit today.  Pt was last seen in cardiology clinic on 04/11/22 by Christell Faith, PA-C.  At that time Jonuel Butterfield was seen due to an ED visit.  He had a near syncopal episode on 04/08/2022 with palpitations.  Per the patient he has not had any more of these elevated heart rates or any further syncope.  The patient is now pending procedure as outlined above. Since his last visit, he has been doing okay from a heart standpoint.  He has an echocardiogram scheduled the beginning of December and a ZIO monitor is currently being mailed to him.  We discussed the importance of following through with his cardiac work-up prior to surgery.  He states that there currently is not a date for surgery on the books.  From an activity standpoint, he is able to walk without difficulty and is able to do all of his own indoor housework as well as a flight of stairs.  For this reason, he is scored a 5.07 METS on the DASI.  This exceeds the 4 METS minimum requirement.  Echocardiogram and Zio monitor ordered.   Per Dr. Rockey Situ, he is okay to hold warfarin for 5 days with Lovenox bridge.  He has his INR checked at the [facility] Coumadin clinic.  They would need to arrange for his Lovenox bridge and go over instructions.   Home Medications    Prior to Admission medications   Medication Sig Start Date End Date Taking? Authorizing Provider  albuterol (VENTOLIN HFA) 108 (90 Base) MCG/ACT inhaler Inhale into the lungs as needed. 09/27/20   [provider]  atorvastatin (LIPITOR) 80 MG tablet Take 80 mg by mouth daily. 12/01/21   [provider]  azaTHIOprine (IMURAN) 50 MG tablet Take 50 mg by mouth daily.  06/22/21   [provider]  colchicine 0.6 MG tablet Take by mouth. 11/10/21   [provider]  [employer] NASAL ALLERGY SPRAY 55 MCG/ACT AERO nasal inhaler SMARTSIG:2 Spray(s) Both Nares Daily 12/01/21   [provider]  diltiazem (CARDIZEM CD) 120 MG 24 hr capsule TAKE 1 CAPSULE EVERY DAY 12/12/21   Theora Gianotti, NP  enalapril (VASOTEC) 20 MG tablet TAKE 1 TABLET EVERY DAY 01/09/22   Minna Merritts, MD  febuxostat (ULORIC) 40 MG tablet Take by mouth. 09/13/21   [provider]  fluticasone (FLONASE) 50 MCG/ACT nasal spray Place into the nose as needed. 10/04/20   [provider]  fluticasone-salmeterol (ADVAIR) 250-50 MCG/ACT AEPB Inhale into the lungs daily. 09/27/20   [provider]  hydroxychloroquine (PLAQUENIL) 200 MG tablet Take 1 tablet by mouth 2 (two) times daily. 09/05/21   [provider]  metoprolol tartrate (LOPRESSOR) 50 MG tablet TAKE 1 AND 1/2 TABLETS TWICE DAILY 07/18/21   Theora Gianotti, NP  Multiple Vitamin (MULTI-VITAMINS) TABS Take by mouth.    [provider]  mycophenolate (CELLCEPT) 500 MG tablet Take 2 tablets  by mouth 2 (two) times daily. 11/09/21   [provider]  pantoprazole (PROTONIX) 40 MG tablet Take 40 mg by mouth 2 (two) times daily.    [provider]  potassium chloride SA (KLOR-CON M) 20 MEQ tablet TAKE 1 TABLET EVERY DAY 07/18/21   Theora Gianotti, NP  predniSONE (DELTASONE) 5 MG tablet Take 5 mg by mouth daily with breakfast. 06/07/20   [provider]  sildenafil (VIAGRA) 50 MG tablet Take 1-2 tablets (50-100 mg total) by mouth daily as needed for erectile dysfunction. 09/14/21   Billey Co, MD  tamsulosin (FLOMAX) 0.4 MG CAPS capsule Take 1 capsule (0.4 mg total) by mouth daily. 12/15/21   Billey Co, MD  torsemide (DEMADEX) 20 MG tablet Take 0.5 tablets (10 mg total) by mouth daily. 02/23/22   Minna Merritts, MD  warfarin  (COUMADIN) 5 MG tablet TAKE 1 TABLET (5 MG TOTAL) BY MOUTH DAILY. 01/12/22   Minna Merritts, MD    Physical Exam    Vital Signs:  Perle Gibbon does not have vital signs available for review today.  Given telephonic nature of communication, physical exam is limited. AAOx3. NAD. Normal affect.  Speech and respirations are unlabored.  Accessory Clinical Findings    None  Assessment & Plan    1.  Preoperative Cardiovascular Risk Assessment:  Mr. Zawadzki perioperative risk of a major cardiac event is 0.4% according to the Revised Cardiac Risk Index (RCRI).  Therefore, he is at low risk for perioperative complications.   His functional capacity is good at 5.07 METs according to the [facility] Activity Status Index (DASI). Recommendations: He will need to complete full workup for syncopal event including zio monitor and echocardiogram before clearance can be granted.   Antiplatelet and/or Anticoagulation Recommendations:  Coumadin can by held for 5 days prior to surgery.  Please resume post op when felt to be safe. Lovenox bridge is required.   A copy of this note will be routed to requesting surgeon.  Time:   Today, I have spent 15 minutes with the patient with telehealth technology discussing medical history, symptoms, and management plan.     Elgie Collard, PA-C  04/12/2022, 2:23 PM

## 2022-04-14 ENCOUNTER — Telehealth: Payer: Self-pay

## 2022-04-14 DIAGNOSIS — R002 Palpitations: Secondary | ICD-10-CM | POA: Diagnosis not present

## 2022-04-14 NOTE — Telephone Encounter (Signed)
Lpmtcb and discuss upcoming surgery/Lovenox Bridging.

## 2022-04-17 ENCOUNTER — Telehealth: Payer: Self-pay

## 2022-04-17 NOTE — Telephone Encounter (Signed)
I spoke to the patient and he will keep me informed when procedure is scheduled.

## 2022-04-17 NOTE — Telephone Encounter (Signed)
Lpmtcb and discuss procedure/holding Coumadin.

## 2022-04-17 NOTE — Telephone Encounter (Signed)
DONE

## 2022-05-02 ENCOUNTER — Ambulatory Visit: Payer: Medicare HMO | Attending: Physician Assistant

## 2022-05-02 DIAGNOSIS — R002 Palpitations: Secondary | ICD-10-CM | POA: Diagnosis not present

## 2022-05-02 DIAGNOSIS — R000 Tachycardia, unspecified: Secondary | ICD-10-CM

## 2022-05-02 DIAGNOSIS — I43 Cardiomyopathy in diseases classified elsewhere: Secondary | ICD-10-CM | POA: Diagnosis not present

## 2022-05-02 DIAGNOSIS — I428 Other cardiomyopathies: Secondary | ICD-10-CM | POA: Diagnosis not present

## 2022-05-02 LAB — ECHOCARDIOGRAM COMPLETE
AR max vel: 2.52 cm2
AV Area VTI: 2.82 cm2
AV Area mean vel: 2.73 cm2
AV Mean grad: 6.3 mmHg
AV Peak grad: 10.2 mmHg
Ao pk vel: 1.6 m/s
Area-P 1/2: 3.97 cm2
S' Lateral: 3.3 cm
Single Plane A4C EF: 71.7 %

## 2022-05-02 NOTE — Telephone Encounter (Signed)
Patient was in to have echocardiogram today and was asking about status of clearance. I informed that his test will need to be read by a provider, and then someone should be reaching out to him regarding the next steps for his clearance.

## 2022-05-02 NOTE — Telephone Encounter (Signed)
I will send message to the ordering provider for the echo, to see if she is able to clear the pt. Pt was seen by Nicholes Rough, [facility] 04/12/22 and ordered an echo for pre op clearance.

## 2022-05-04 NOTE — Telephone Encounter (Signed)
Called the requesting office and could not leave a voice message. Will try calling again

## 2022-05-04 NOTE — Telephone Encounter (Signed)
Echo shows a preserved LV systolic function with aortic sclerosis without evidence of stenosis.  We will need to await patient's Zio patch prior to providing final preoperative cardiac risk stratification.

## 2022-05-12 ENCOUNTER — Telehealth: Payer: Self-pay | Admitting: *Deleted

## 2022-05-12 DIAGNOSIS — I493 Ventricular premature depolarization: Secondary | ICD-10-CM

## 2022-05-12 DIAGNOSIS — I43 Cardiomyopathy in diseases classified elsewhere: Secondary | ICD-10-CM

## 2022-05-12 DIAGNOSIS — R42 Dizziness and giddiness: Secondary | ICD-10-CM

## 2022-05-12 DIAGNOSIS — I428 Other cardiomyopathies: Secondary | ICD-10-CM

## 2022-05-12 DIAGNOSIS — I4892 Unspecified atrial flutter: Secondary | ICD-10-CM

## 2022-05-12 DIAGNOSIS — R002 Palpitations: Secondary | ICD-10-CM

## 2022-05-12 MED ORDER — METOPROLOL TARTRATE 100 MG PO TABS: 100.0000 mg | ORAL_TABLET | Freq: Two times a day (BID) | ORAL | 3 refills | Status: DC

## 2022-05-12 NOTE — Telephone Encounter (Signed)
-----   Message from Rise Mu, PA-C sent at 05/12/2022  4:45 PM EST ----- Zio patch showed a predominant rhythm of sinus with an average rate of 64 bpm (range 37 to 210 bpm), 344 episodes of SVT (fast heart rate coming from the top portion of the heart) with the fastest interval lasting 6 beats at a rate of 210 bpm and the longest interval lasting 8 beats.  Frequent PACs representing an 11.5% burden, occasional atrial couplets, occasional PVCs representing a 1.7% burden.  2 patient triggered events were associated with PACs.  Recommendations: -Increase Lopressor to 100 mg twice daily -Please place future orders for BMP, magnesium, and TSH (diagnosis SVT) -Follow-up as previously planned next month to reassess symptoms on titrated metoprolol

## 2022-05-12 NOTE — Telephone Encounter (Signed)
Reviewed results and recommendations with patient and he verbalized understanding with no further questions. Discussed labs and location to have those done. Advised I would send My Chart message with that information. He verbalized understanding with no further questions at this time.

## 2022-05-15 ENCOUNTER — Other Ambulatory Visit: Payer: Self-pay | Admitting: Otolaryngology

## 2022-05-15 DIAGNOSIS — J329 Chronic sinusitis, unspecified: Secondary | ICD-10-CM

## 2022-05-17 ENCOUNTER — Ambulatory Visit: Payer: Medicare HMO | Attending: Cardiovascular Disease

## 2022-05-17 ENCOUNTER — Other Ambulatory Visit: Payer: Self-pay

## 2022-05-17 DIAGNOSIS — Z5181 Encounter for therapeutic drug level monitoring: Secondary | ICD-10-CM

## 2022-05-17 DIAGNOSIS — I4892 Unspecified atrial flutter: Secondary | ICD-10-CM

## 2022-05-17 LAB — POCT INR: INR: 2.1 (ref 2.0–3.0)

## 2022-05-17 NOTE — Patient Instructions (Signed)
-   CONTINUE 1 tablet every day EXCEPT 1/2 TABLET ON MONDAYS & FRIDAYS;   - recheck in 7 weeks Coumadin Clinic [phone] - call if you are put on any antibiotics or steroids

## 2022-05-31 ENCOUNTER — Encounter: Payer: Self-pay | Admitting: *Deleted

## 2022-06-02 ENCOUNTER — Ambulatory Visit
Admission: RE | Admit: 2022-06-02 | Discharge: 2022-06-02 | Disposition: A | Discharge status: Home / Self Care | Payer: Medicare HMO | Source: Ambulatory Visit | Attending: Otolaryngology | Admitting: Otolaryngology

## 2022-06-02 DIAGNOSIS — J329 Chronic sinusitis, unspecified: Secondary | ICD-10-CM

## 2022-06-12 NOTE — Progress Notes (Unsigned)
Cardiology Office Note  Date:  06/13/2022   ID:  [name], DOB 1960/12/06, MRN [id number]  PCP:  Ashley Akin, MD   Chief Complaint  Patient presents with   2 month follow up     Patient c/o A-fib spells & rapid heart beats more frequently.  Medications reviewed by the patient verbally.      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,  Surgery 05/10/2016 for flow to hands hospitalization for hemolytic anemia, shortness of breath He presents today for follow-up of his atrial flutter, shortness of breath symptoms  Last seen in clinic by myself February 2023 Seen by one of our providers November 2023  Echo December 2023 Ejection fraction 55 to 60%  Amiodarone previously held for underlying lung disease  ABX for sinus issues, followed by ENT Noted elevated heart rate at times, rates into the 120 range on his watch  Zio monitor discussed from 11/23 Normal sinus rhythm Patient had a min HR of 37 bpm, max HR of 210 bpm, and avg HR of 64 bpm.    344 Supraventricular Tachycardia runs occurred, the run with the fastest interval lasting 6 beats with a max rate of 210 bpm, the longest lasting 8 beats with an avg rate of 123 bpm.    Isolated SVEs were frequent (11.5%, [id number]), SVE Couplets were occasional (2.6%, 29528), and SVE Triplets were rare (<1.0%, 4132).    Isolated VEs were occasional (1.7%, 22282), VE Couplets were rare (<1.0%, 27), and VE Triplets were rare (<1.0%, 1).  Ventricular Bigeminy and Trigeminy were present.   Patient  triggered events (2), associated with rare PACs  Denies significant leg swelling, no chest pain concerning for angina Tolerating warfarin, not on Eliquis secondary to cost  Prednisone being weaned by rheumatology "Stable lungs" On oxygen, 2 l on portable generator  Pulmonary fibrosis followed by Dr. Raul Del, stable Did not wear his oxygen today  Retired, sedentary  Lab work reviewed Total chol 183, LDL 105  EKG personally reviewed by myself on todays visit Normal sinus rhythm rate 84 bpm, short runs atrial tachycardia, PACs, rare PVC, poor R wave progression to the anterior precordial leads, nonspecific T wave abnormality  Other past medical history reviewed On August 2017 he was in atrial flutter with rapid ventricular rate on anticoagulation, started on amiodarone converted to normal sinus rhythm, Amiodarone dose decreased  Prior history ofSores on fingers b/l, Severe symptoms on one of his fingers, nonhealing ulcerations Improved following vascularsurgery December 2017   presented to the hospital 11/22/2015 with weakness, fall, hemoglobin of 5.5 Guiac negative, GI was consult and colonoscopy and EGD revealed no evidence of bleeding, Further evaluation by hematology confirmed hemolytic anemia, started on high-dose prednisone He did have transfusion 4   seen on a regular basis by Dr. Jefm Bryant for his mixed connective tissue disease Last visit January 2017 per the notes   Previous 30 day monitor that showed 3 episodes of PVCs in a bigeminal manner. He was asymptomatic. Arrhythmia sometimes in the morning, other times in the early evening.  Was not on a consistent basis.    admitted to [facility] in April of 2016 for 3 week history of increased dyspnea and palpitations and found to be in new onset atrial flutter.   TTE showed EF 20-25%, global HK, possible bicuspid aortic valve.    successful TEE/DCCV on 4/1. TEE showed EF 30-35%, no intracardiac thrombus seen, mildly dilated left  atrium, mild to moderate aortic aortic sclerosis with evidence of stenosis.    Repeat TTE  showed improved EF of 55-60%, select images suggestive of hypokinesis of the inferior and posterior myocardium. LV diastolic parameters were normal. Left atrium was normal in size. RV function was normal. PASP was normal. He was advised further work up if he has symptoms.    On 11/27/14 while at work he suddenly developed onset of palpitations that led to increased SOB, especially with ambulation causing patient to leave work early. Patient checked his pulse and found it to be in the 140's.Pulse went back into the 60's to 80's without intervention.   Recurrent chest pain 7/5 he was woken up around 2 AM with left shoulder pain that radiated to his left chest.   He presented to [facility] for evaluation. Troponin was found to be mildly elevated and flated trending 0.08-->0.05-->0.06-->0.07.    He underwent cardiac cath 48 hours which showed right dominant system, no significant CAD, normal LV gram with EF >55%    the patient reports hand sensitivity to cold which started about 2 years ago and has been worsening. His fingers turn blue and her with significant pain when he is exposed to cold weather. He usually does better in the summertime. He developed an ulceration on the tip of the left small finger about 3 months ago which has gradually worsened with early gangrenous changes. No trauma.    he underwent upper extremity duplex which showed atypical flow in the ulnar artery and normal flow through the radial artery.  PMH:   has a past medical history of Anemia (10/2015), Discoid lupus, Gangrene of finger ([place]), GERD (gastroesophageal reflux disease), Gout, Hemolytic anemia ([place]), History of nuclear stress test, HTN (hypertension), Normal coronary arteries, Paroxysmal atrial flutter ([place]), Pulmonary fibrosis ([place]), Pulmonary sarcoidosis ([place]), RA (rheumatoid arthritis) ([place]), and Tachycardia-induced cardiomyopathy  ([place]).  PSH:    Past Surgical History:  Procedure Laterality Date   CARDIAC CATHETERIZATION N/A 12/03/2014   Procedure: Left Heart Cath and Coronary Angiography;  Surgeon: Minna Merritts, MD;  Location: [place] [facility];  Service: Cardiovascular;  Laterality: N/A;   COLONOSCOPY WITH PROPOFOL N/A 11/23/2015   Procedure: COLONOSCOPY WITH PROPOFOL;  Surgeon: [name], MD;  Location: [facility];  Service: Endoscopy;  Laterality: N/A;   COLONOSCOPY WITH PROPOFOL N/A 05/04/2017   Procedure: COLONOSCOPY WITH PROPOFOL;  Surgeon: [name], MD;  Location: [place];  Service: Endoscopy;  Laterality: N/A;   ESOPHAGOGASTRODUODENOSCOPY (EGD) WITH PROPOFOL N/A 11/23/2015   Procedure: ESOPHAGOGASTRODUODENOSCOPY (EGD) WITH PROPOFOL;  Surgeon: [name], MD;  Location: [facility];  Service: Endoscopy;  Laterality: N/A;   HAND SURGERY Left    PERIPHERAL VASCULAR CATHETERIZATION N/A 11/17/2015   Procedure: Upper Extremity Angiography;  Surgeon: Wellington Hampshire, MD;  Location: [place] [facility];  Service: Cardiovascular;  Laterality: N/A;   POLYPECTOMY  05/04/2017   Procedure: POLYPECTOMY;  Surgeon: [name], MD;  Location: [facility];  Service: Endoscopy;;    Current Outpatient Medications  Medication Sig Dispense Refill   albuterol (VENTOLIN HFA) 108 (90 Base) MCG/ACT inhaler Inhale  into the lungs as needed.     atorvastatin (LIPITOR) 80 MG tablet Take 80 mg by mouth daily.     azaTHIOprine (IMURAN) 50 MG tablet Take 50 mg by mouth daily.     cefdinir (OMNICEF) 300 MG capsule Take 300 mg by mouth 2 (two) times daily.     colchicine 0.6 MG tablet Take 0.6 mg by mouth daily.     [employer]S NASAL ALLERGY SPRAY 55 MCG/ACT AERO nasal inhaler SMARTSIG:2 Spray(s) Both Nares Daily     febuxostat (ULORIC) 40 MG tablet Take 40 mg by mouth daily.     fluticasone (FLONASE) 50 MCG/ACT nasal spray Place into the nose as needed.     fluticasone-salmeterol (ADVAIR) 250-50 MCG/ACT AEPB  Inhale into the lungs daily.     hydrocortisone 2.5 % cream Apply 1 Application topically 2 (two) times daily.     hydroxychloroquine (PLAQUENIL) 200 MG tablet Take 1 tablet by mouth 2 (two) times daily.     ketoconazole (NIZORAL) 2 % shampoo Apply topically 3 (three) times a week.     metoprolol tartrate (LOPRESSOR) 100 MG tablet Take 1 tablet (100 mg total) by mouth 2 (two) times daily. 180 tablet 3   Multiple Vitamin (MULTI-VITAMINS) TABS Take by mouth.     mycophenolate (CELLCEPT) 500 MG tablet Take 2 tablets by mouth 2 (two) times daily.     pantoprazole (PROTONIX) 40 MG tablet Take 40 mg by mouth 2 (two) times daily.     potassium chloride SA (KLOR-CON M) 20 MEQ tablet TAKE 1 TABLET EVERY DAY 90 tablet 3   predniSONE (DELTASONE) 5 MG tablet Take 5 mg by mouth daily with breakfast.     sildenafil (VIAGRA) 50 MG tablet Take 1-2 tablets (50-100 mg total) by mouth daily as needed for erectile dysfunction. 30 tablet 11   tamsulosin (FLOMAX) 0.4 MG CAPS capsule Take 1 capsule (0.4 mg total) by mouth daily. 90 capsule 3   torsemide (DEMADEX) 20 MG tablet Take 0.5 tablets (10 mg total) by mouth daily. 45 tablet 2   warfarin (COUMADIN) 5 MG tablet TAKE 1 TABLET (5 MG TOTAL) BY MOUTH DAILY. 90 tablet 1   diltiazem (CARDIZEM CD) 120 MG 24 hr capsule Take 2 capsules (240 mg total) by mouth daily. 180 capsule 1   enalapril (VASOTEC) 20 MG tablet Take 1 tablet (20 mg total) by mouth 2 (two) times daily. 180 tablet 3   No current facility-administered medications for this visit.     Allergies:   Allopurinol and Probenecid   Social History:  The patient  reports that he quit smoking about 17 years ago. His smoking use included cigarettes. He has a 20.00 pack-year smoking history. He has been exposed to tobacco smoke. He has never used smokeless tobacco. He reports that he does not drink alcohol and does not use drugs.   Family History:   family history includes Leukemia in his mother.    Review of  Systems: Review of Systems  Constitutional: Negative.   HENT: Negative.    Respiratory:  Positive for shortness of breath.   Cardiovascular: Negative.   Gastrointestinal: Negative.   Musculoskeletal: Negative.   Neurological: Negative.   Psychiatric/Behavioral: Negative.    All other systems reviewed and are negative.   PHYSICAL EXAM: VS:  BP 132/80 (BP Location: Left Arm, Patient Position: Sitting, Cuff Size: Normal)   Pulse 84   Ht '5\' 10"'$  (1.778 m)   Wt 255 lb (115.7 kg)   SpO2 94%  BMI 36.59 kg/m  , BMI Body mass index is 36.59 kg/m. Constitutional:  oriented to person, place, and time. No distress.  HENT:  Head: Grossly normal Eyes:  no discharge. No scleral icterus.  Neck: No JVD, no carotid bruits  Cardiovascular: Regular rate and rhythm, no murmurs appreciated, ectopy appreciated Pulmonary/Chest: Clear to auscultation bilaterally, no wheezes or rails Abdominal: Soft.  no distension.  no tenderness.  Musculoskeletal: Normal range of motion Neurological:  normal muscle tone. Coordination normal. No atrophy Skin: Skin warm and dry Psychiatric: normal affect, pleasant   Recent Labs: 11/24/2021: B Natriuretic Peptide 40.0 04/08/2022: BUN 20; Creatinine, Ser 1.25; Hemoglobin 13.4; Platelets 198; Potassium 3.6; Sodium 140    Lipid Panel Lab Results  Component Value Date   CHOL 173 04/18/2021   HDL 49 04/18/2021   LDLCALC 92 04/18/2021   TRIG 187 (H) 04/18/2021      Wt Readings from Last 3 Encounters:  06/13/22 255 lb (115.7 kg)  04/11/22 249 lb 12.8 oz (113.3 kg)  04/08/22 250 lb (113.4 kg)     ASSESSMENT AND PLAN:  Essential hypertension -  Recommend he double his diltiazem ER 120 up to twice daily for rhythm issues as below  Frequent PVCs - Plan: EKG 12-Lead Continue metoprolol tartrate 100 twice daily Increase diltiazem ER 120 twice daily for tachyarrhythmia  Shortness of breath - Plan: EKG 12-Lead Pulmonary fibrosis, followed by pulmonary on  oxygen, feels he is stable  Typical atrial flutter (HCC) amiodarone stopped secondary to underlying lung disease Tolerating anticoagulation Feels he is having paroxysmal tachycardia Increase diltiazem ER up to 120 twice daily If dose is too high may need to decrease down to diltiazem ER 180 daily  Pulmonary fibrosis ([place]) On room air today Followed by Dr. Raul Del Weaning prednisone  Encounter for anticoagulation discussion and counseling Tolerating warfarin, NOAC was too expensive   Total encounter time more than 30 minutes  Greater than 50% was spent in counseling and coordination of care with the patient    Orders Placed This Encounter  Procedures   EKG 12-Lead     Signed, Esmond Plants, M.D., Ph.D. 06/13/2022  [place], [place]

## 2022-06-13 ENCOUNTER — Ambulatory Visit: Payer: Medicare HMO | Attending: Cardiovascular Disease | Admitting: Cardiovascular Disease

## 2022-06-13 ENCOUNTER — Telehealth: Payer: Self-pay | Admitting: Cardiovascular Disease

## 2022-06-13 ENCOUNTER — Encounter: Payer: Self-pay | Admitting: Cardiovascular Disease

## 2022-06-13 VITALS — BP 132/80 | HR 84 | Ht 70.0 in | Wt 255.0 lb

## 2022-06-13 DIAGNOSIS — I428 Other cardiomyopathies: Secondary | ICD-10-CM | POA: Diagnosis not present

## 2022-06-13 DIAGNOSIS — I493 Ventricular premature depolarization: Secondary | ICD-10-CM

## 2022-06-13 DIAGNOSIS — I4892 Unspecified atrial flutter: Secondary | ICD-10-CM | POA: Diagnosis not present

## 2022-06-13 DIAGNOSIS — I10 Essential (primary) hypertension: Secondary | ICD-10-CM

## 2022-06-13 DIAGNOSIS — J8410 Pulmonary fibrosis, unspecified: Secondary | ICD-10-CM

## 2022-06-13 DIAGNOSIS — R000 Tachycardia, unspecified: Secondary | ICD-10-CM | POA: Diagnosis not present

## 2022-06-13 DIAGNOSIS — I483 Typical atrial flutter: Secondary | ICD-10-CM

## 2022-06-13 DIAGNOSIS — I43 Cardiomyopathy in diseases classified elsewhere: Secondary | ICD-10-CM

## 2022-06-13 DIAGNOSIS — J9611 Chronic respiratory failure with hypoxia: Secondary | ICD-10-CM

## 2022-06-13 MED ORDER — DILTIAZEM HCL ER COATED BEADS 120 MG PO CP24: 120.0000 mg | ORAL_CAPSULE | Freq: Two times a day (BID) | ORAL | 1 refills | Status: DC

## 2022-06-13 MED ORDER — DILTIAZEM HCL ER COATED BEADS 120 MG PO CP24: 240.0000 mg | ORAL_CAPSULE | Freq: Every day | ORAL | 1 refills | Status: DC

## 2022-06-13 MED ORDER — ENALAPRIL MALEATE 20 MG PO TABS: 20.0000 mg | ORAL_TABLET | Freq: Two times a day (BID) | ORAL | 3 refills | Status: DC

## 2022-06-13 MED ORDER — ENALAPRIL MALEATE 20 MG PO TABS: 20.0000 mg | ORAL_TABLET | Freq: Two times a day (BID) | ORAL | 0 refills | Status: DC

## 2022-06-13 NOTE — Telephone Encounter (Signed)
Pt informed a 30 day supply has been sent to the requesting pharmacy. Pt verbalized understanding.

## 2022-06-13 NOTE — Patient Instructions (Signed)
Medication Instructions:  Please increase the diltiazem ER up to 120 mg twice a day  If you need a refill on your cardiac medications before your next appointment, please call your pharmacy.   Lab work: No new labs needed  Testing/Procedures: No new testing needed  Follow-Up: At [facility], you and your health needs are our priority.  As part of our continuing mission to provide you with exceptional heart care, we have created designated Provider Care Teams.  These Care Teams include your primary Cardiologist (physician) and Advanced Practice Providers (APPs -  Physician Assistants and Nurse Practitioners) who all work together to provide you with the care you need, when you need it.  You will need a follow up appointment in 12 months  Providers on your designated Care Team:   Murray Hodgkins, NP Christell Faith, PA-C Cadence Kathlen Mody, [place]  COVID-19 Vaccine Information can be found at: [email or web address] For questions related to vaccine distribution or appointments, please email [email or web address] or call [phone].

## 2022-06-13 NOTE — Addendum Note (Signed)
Addended by: Raelene Bott, Brandee Markin L on: 06/13/2022 10:47 AM   Modules accepted: Orders

## 2022-06-13 NOTE — Telephone Encounter (Signed)
Pt c/o medication issue:  1. Name of Medication:   2. How are you currently taking this medication (dosage and times per day)? enalapril (VASOTEC) 20 MG tablet   3. Are you having a reaction (difficulty breathing--STAT)? No  4. What is your medication issue? Pt is requesting enough sent in to:  [id number] [place] [place] Since he has been without 3 days.  He says it was sent to [facility], but would like enough to cover until he receive it through them.

## 2022-06-15 ENCOUNTER — Telehealth: Payer: Self-pay | Admitting: Cardiovascular Disease

## 2022-06-15 NOTE — Telephone Encounter (Signed)
   Pre-operative Risk Assessment    Patient Name: [name]  DOB: [birth year] MRN: [id number]      Request for Surgical Clearance    Procedure:   Left maxillary antrostomy with tissue removal, left anterior ethmoidectomy, IGSS  Date of Surgery:  Clearance TBD                                 Surgeon:  Dr [place] Group or Practice Name:  [facility] Phone number:  [phone] Fax number:  [phone]   Type of Clearance Requested:   - Pharmacy:  Hold Warfarin (Coumadin) instructions pre-op and post-op   Type of Anesthesia:  General    Additional requests/questions:    Manfred Arch   06/15/2022, 2:59 PM

## 2022-06-19 ENCOUNTER — Other Ambulatory Visit: Payer: Self-pay | Admitting: *Deleted

## 2022-06-19 MED ORDER — METOPROLOL TARTRATE 100 MG PO TABS: 100.0000 mg | ORAL_TABLET | Freq: Two times a day (BID) | ORAL | 3 refills | Status: DC

## 2022-06-19 NOTE — Telephone Encounter (Signed)
   Patient Name: [name]  DOB: 1961/02/09 MRN: [id number]  Primary Cardiologist: Ida Rogue, MD  Clinical pharmacists have reviewed the patient's past medical history, labs, and current medications as part of preoperative protocol coverage. The following recommendations have been made:  Patient with diagnosis of afib on warfarin for anticoagulation.     Procedure: Left maxillary antrostomy with tissue removal, left anterior ethmoidectomy, IGSS  Date of procedure: TBD    CHA2DS2-VASc Score = 3  This indicates a 3.2% annual risk of stroke. The patient's score is based upon: CHF History: 1 HTN History: 1 Diabetes History: 0 Stroke History: 0 Vascular Disease History: 1 Age Score: 0 Gender Score: 0    Of note there is CVA on his problem list from [place], however patient was seen 07/06/16 by neurology for numbness in face. He has known trigeminal neuralgia. MRI of brain was ordered to rule out CVA, but does not appear it was ever done.    Per office protocol, patient can hold warfarin for 5 days prior to procedure.     Patient will NOT need bridging with Lovenox (enoxaparin) around procedure.  Please resume warfarin as soon as possible postprocedure, at the discretion of the surgeon.   I will route this recommendation to the requesting party via Epic fax function and remove from pre-op pool.  Please call with questions.  Lenna Sciara, NP 06/19/2022, 12:04 PM

## 2022-06-19 NOTE — Telephone Encounter (Signed)
Patient with diagnosis of afib on warfarin for anticoagulation.    Procedure: Left maxillary antrostomy with tissue removal, left anterior ethmoidectomy, IGSS  Date of procedure: TBD   CHA2DS2-VASc Score = 3   This indicates a 3.2% annual risk of stroke. The patient's score is based upon: CHF History: 1 HTN History: 1 Diabetes History: 0 Stroke History: 0 Vascular Disease History: 1 Age Score: 0 Gender Score: 0      Of note there is CVA on his problem list from [place], however patient was seen 07/06/16 by neurology for numbness in face. He has known trigeminal neuralgia. MRI of brain was ordered to rule out CVA, but does not appear it was ever done.   Per office protocol, patient can hold warfarin for 5 days prior to procedure.    Patient will NOT need bridging with Lovenox (enoxaparin) around procedure.  **This guidance is not considered finalized until pre-operative APP has relayed final recommendations.**

## 2022-06-27 ENCOUNTER — Telehealth: Payer: Self-pay

## 2022-06-27 NOTE — Telephone Encounter (Signed)
Secure chat message conversation  Alexander Bergeron Good Morning, we work with [facility], we have a mutual pt Mr. [name] that we are trying to get scheduled for surgery with Dr. Pryor Ochoa. We did receive the cardiac clearance to hold warfarin 5 days pre-op, Dr.Vaught is requesting post-op medication instruction, would you be able to help US obtain this?  9:17AM  Alvis Lemmings, RN I presume we are talking about the warfarin post procedure.? We typically don't advise regarding medications post op as that is up to the discretion of the performing physician. Will add Dr. Rockey Situ on here to see what his thoughts are though.  9:23AM  Minna Merritts, MD We probably need to have this addressed in a epic note and have it officially documented. Can [facility] send [place] a epic note with what the question is whether it is medications or warfarin start up after surgery.  9:46AM  Alexander Bergeron Absolutely, we faxed a clearance with requested post-op instructions on 1/18 and 1/22 but I can refax it today can I confirm [phone] is the correct fax number 10:22AM  Midge Aver, RMA I did find this in the 06/15/2022 telephone note from our preop team.  Per office protocol, patient can hold warfarin for 5 days prior to procedure.   Patient will NOT need bridging with Lovenox (enoxaparin) around procedure. Please resume warfarin as soon as possible postprocedure, at the discretion of the surgeon.   I will route this recommendation to the requesting party via Epic fax function and remove from pre-op pool.  Please call with questions.  Lenna Sciara, NP 06/19/2022, 12:04 PM   Dr. Rockey Situ would like to know if there is anything that you need further clarified? Thank you! 10:29AM  Hi, This is Musician. Several Questions here and sorry for confusion. 1) Note from 03/30/2022 states hold coumadin for 5 days and need Lovenox bridge that was going to be handled by Coumadin clinic. 2) The  instructions are Pre-op. I am asking for POST-operatively on how long we can hold anticoagulation. This is the sticking point for the patient as if we have to restart immediately he will be at high risk of post-operative bleeding. Thanks.  11:05AM  Minna Merritts, MD I think the consensus on recent follow-up was that he does not need Lovenox bridge preprocedure as listing of prior stroke was likely inaccurate (note 06/15/22). For the most part has been maintaining normal sinus rhythm,. Recent medication changes in the office for better rate and rhythm control. Post procedure, we can avoid the complications of Lovenox bridging and would start warfarin when you feel risk of bleeding has moderated. We would defer the restart to your discretion if that is okay.  11:31AM

## 2022-06-28 NOTE — Telephone Encounter (Signed)
[name] Perfect thank you we made Dr.Vaught aware- does pt still need upcoming apt on 2/7? 10:36 AM  [name] Good morning [name]! [name] (in the coumadin clinic) said he will still need to see him that day for an INR check even though he will not need the Lovenox bridge so we are going to have him keep the appointment. Thank you! Have a good day!  10:44 AM  [name] thank you 11:06 MA

## 2022-07-05 ENCOUNTER — Ambulatory Visit: Payer: Medicare HMO | Attending: Cardiovascular Disease

## 2022-07-05 DIAGNOSIS — Z5181 Encounter for therapeutic drug level monitoring: Secondary | ICD-10-CM | POA: Diagnosis not present

## 2022-07-05 DIAGNOSIS — I4892 Unspecified atrial flutter: Secondary | ICD-10-CM

## 2022-07-05 LAB — POCT INR: INR: 1.7 — AB (ref 2.0–3.0)

## 2022-07-05 NOTE — Patient Instructions (Signed)
-  Sinus surgery 2/27; HOLDING 2/22-2/26;  TAKE 2 TABLETS TODAY ONLY THEN - CONTINUE 1 tablet every day EXCEPT 1/2 TABLET ON MONDAYS & FRIDAYS;   - recheck in 4 weeks Coumadin Clinic [phone] - call if you are put on any antibiotics or steroids

## 2022-07-11 ENCOUNTER — Inpatient Hospital Stay: Payer: Medicare HMO

## 2022-07-11 ENCOUNTER — Inpatient Hospital Stay: Payer: Medicare HMO | Attending: Internal Medicine | Admitting: Internal Medicine

## 2022-07-11 ENCOUNTER — Encounter: Payer: Self-pay | Admitting: Internal Medicine

## 2022-07-11 VITALS — BP 145/84 | HR 73 | Temp 97.5°F | Resp 18 | Wt 254.0 lb

## 2022-07-11 DIAGNOSIS — M069 Rheumatoid arthritis, unspecified: Secondary | ICD-10-CM | POA: Diagnosis not present

## 2022-07-11 DIAGNOSIS — I10 Essential (primary) hypertension: Secondary | ICD-10-CM | POA: Diagnosis not present

## 2022-07-11 DIAGNOSIS — J8410 Pulmonary fibrosis, unspecified: Secondary | ICD-10-CM | POA: Insufficient documentation

## 2022-07-11 DIAGNOSIS — D869 Sarcoidosis, unspecified: Secondary | ICD-10-CM | POA: Diagnosis not present

## 2022-07-11 DIAGNOSIS — J9610 Chronic respiratory failure, unspecified whether with hypoxia or hypercapnia: Secondary | ICD-10-CM | POA: Insufficient documentation

## 2022-07-11 DIAGNOSIS — Z87891 Personal history of nicotine dependence: Secondary | ICD-10-CM | POA: Insufficient documentation

## 2022-07-11 DIAGNOSIS — D594 Other nonautoimmune hemolytic anemias: Secondary | ICD-10-CM | POA: Diagnosis not present

## 2022-07-11 DIAGNOSIS — I4891 Unspecified atrial fibrillation: Secondary | ICD-10-CM | POA: Insufficient documentation

## 2022-07-11 DIAGNOSIS — Z79631 Long term (current) use of antimetabolite agent: Secondary | ICD-10-CM | POA: Diagnosis not present

## 2022-07-11 DIAGNOSIS — Z7951 Long term (current) use of inhaled steroids: Secondary | ICD-10-CM | POA: Insufficient documentation

## 2022-07-11 DIAGNOSIS — Z79899 Other long term (current) drug therapy: Secondary | ICD-10-CM | POA: Insufficient documentation

## 2022-07-11 DIAGNOSIS — Z79624 Long term (current) use of inhibitors of nucleotide synthesis: Secondary | ICD-10-CM | POA: Diagnosis not present

## 2022-07-11 DIAGNOSIS — Z7901 Long term (current) use of anticoagulants: Secondary | ICD-10-CM | POA: Diagnosis not present

## 2022-07-11 DIAGNOSIS — Z7952 Long term (current) use of systemic steroids: Secondary | ICD-10-CM | POA: Insufficient documentation

## 2022-07-11 DIAGNOSIS — D591 Autoimmune hemolytic anemia, unspecified: Secondary | ICD-10-CM | POA: Diagnosis present

## 2022-07-11 LAB — BASIC METABOLIC PANEL
Anion gap: 9 (ref 5–15)
BUN: 22 mg/dL (ref 8–23)
CO2: 25 mmol/L (ref 22–32)
Calcium: 9.2 mg/dL (ref 8.9–10.3)
Chloride: 103 mmol/L (ref 98–111)
Creatinine, Ser: 1.12 mg/dL (ref 0.61–1.24)
GFR, Estimated: >60 mL/min (ref >60)
Glucose, Bld: 100 mg/dL — ABNORMAL HIGH (ref 70–99)
Potassium: 3.9 mmol/L (ref 3.5–5.1)
Sodium: 137 mmol/L (ref 135–145)

## 2022-07-11 LAB — CBC WITH DIFFERENTIAL/PLATELET
Abs Immature Granulocytes: 0.08 10*3/uL — ABNORMAL HIGH (ref 0.00–0.07)
Basophils Absolute: 0 10*3/uL (ref 0.0–0.1)
Basophils Relative: 0 %
Eosinophils Absolute: 0.1 10*3/uL (ref 0.0–0.5)
Eosinophils Relative: 1 %
HCT: 45.6 % (ref 39.0–52.0)
Hemoglobin: 14.1 g/dL (ref 13.0–17.0)
Immature Granulocytes: 1 %
Lymphocytes Relative: 10 %
Lymphs Abs: 1 10*3/uL (ref 0.7–4.0)
MCH: 26.7 pg (ref 26.0–34.0)
MCHC: 30.9 g/dL (ref 30.0–36.0)
MCV: 86.2 fL (ref 80.0–100.0)
Monocytes Absolute: 0.7 10*3/uL (ref 0.1–1.0)
Monocytes Relative: 7 %
Neutro Abs: 7.9 10*3/uL — ABNORMAL HIGH (ref 1.7–7.7)
Neutrophils Relative %: 81 %
Platelets: 152 10*3/uL (ref 150–400)
RBC: 5.29 MIL/uL (ref 4.22–5.81)
RDW: 15.3 % (ref 11.5–15.5)
WBC: 9.7 10*3/uL (ref 4.0–10.5)
nRBC: 0 % (ref 0.0–0.2)

## 2022-07-11 LAB — IRON AND TIBC
Iron: 129 ug/dL (ref 45–182)
Saturation Ratios: 37 % (ref 17.9–39.5)
TIBC: 347 ug/dL (ref 250–450)
UIBC: 218 ug/dL

## 2022-07-11 LAB — FERRITIN: Ferritin: 15 ng/mL — ABNORMAL LOW (ref 24–336)

## 2022-07-11 LAB — LACTATE DEHYDROGENASE: LDH: 141 U/L (ref 98–192)

## 2022-07-11 NOTE — Progress Notes (Signed)
Patient denies new problems/concerns today.    Scheduled for sinus surgery 07/25/22

## 2022-07-11 NOTE — Progress Notes (Signed)
[facility] OFFICE PROGRESS NOTE  Patient Care Team: Ashley Akin, MD as PCP - General (Internal Medicine) Rockey Situ Kathlene November, MD as PCP - Cardiology (Cardiology) Minna Merritts, MD as Consulting Physician (Cardiology) Cammie Sickle, MD as Consulting Physician (Oncology)   Oncology History   No history exists.   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  Ambulating independently.  Devin Jerry89.o.  male pleasant patient above history of Autoimmune hemolytic anemia- currently on prednisone 10 mg/CellCept 500 twice a day [also for Autoimmune/connective tissue disorder]- is here for follow-up.   Patient denies new problems/concerns today.  Scheduled for sinus surgery 07/25/22  Patient denies any worsening shortness of breath.  Patient follows up with Dr. FlRaul Delor pulmonary fibrosis.  Recent CT scan improved.  Chronic mild fatigue.   Review of Systems  Constitutional:  Negative for chills, diaphoresis, fever, malaise/fatigue and weight loss.  HENT:  Negative for nosebleeds and sore throat.   Eyes:  Negative for double vision.  Respiratory:  Negative for cough, hemoptysis, sputum production, shortness of breath and wheezing.   Cardiovascular:  Negative for chest pain, palpitations, orthopnea and leg swelling.  Gastrointestinal:  Negative for abdominal pain, blood in stool, constipation, diarrhea, heartburn, melena, nausea and vomiting.  Genitourinary:  Negative for dysuria, frequency and urgency.  Musculoskeletal:  Positive for back pain and joint pain.  Skin: Negative.  Negative for itching and  rash.  Neurological:  Negative for dizziness, tingling, focal weakness, weakness and headaches.  Endo/Heme/Allergies:  Does not bruise/bleed easily.  Psychiatric/Behavioral:  Negative for depression. The patient is not nervous/anxious and does not have insomnia.      PAST MEDICAL HISTORY :  Past Medical History:  Diagnosis Date   Anemia 10/2015   Discoid lupus    Gangrene of finger (HCC)    GERD (gastroesophageal reflux disease)    Gout    Hemolytic anemia (HCC)    History of nuclear stress test    a. 09/02/2014: no sig ischemia, GI uptake noted, no sig WMA, EF 48%, no EKG chanes concerning for ischemia, low risk scan     HTN (hypertension)    Normal coronary arteries    a. cardiac cath 12/03/2014: no significant CAD, right dominant system, LVEF >55%, no MR or AS   Paroxysmal atrial flutter (HCC)    a. s/p successful TEE/DCCV on 08/28/2014-->was on xarelto, now on warfarin.   Pulmonary fibrosis ([facility]   a. not on home oxygen   Pulmonary sarcoidosis (HCC)    RA (rheumatoid arthritis) ([facility]   Tachycardia-induced cardiomyopathy ([facility]   a. echo 07/2014: EF 20-25%, cannot exclude atrial flutter,  global HK, possible bicuspid Ao valve, mod reduced RV sys fxn, mild-mod aortic valve sclerosis/calcification, mod TR, mildly elevated RVSP; b. TEE 08/28/2014: EF 30-35%, no intracardic thrombus, mildly dilated LA/RA, mild TR, mild-mod aortic sclerosis w/o stenosis; c. echo 09/2014: EF 55-60%, select images w/ inf HK; d. 10/2015 Echo: EF 60-65%    PAST SURGICAL HISTORY :   Past Surgical History:  Procedure Laterality Date   CARDIAC CATHETERIZATION N/A 12/03/2014   Procedure: Left Heart Cath and Coronary Angiography;  Surgeon: TiMinna MerrittsMD;  [facility];  Service: Cardiovascular;  Laterality: N/A;   COLONOSCOPY WITH PROPOFOL N/A 11/23/2015   Procedure: COLONOSCOPY WITH PROPOFOL;  Surgeon: [name], MD;  Location: [facility];  Service: Endoscopy;  Laterality: N/A;    COLONOSCOPY WITH PROPOFOL N/A 05/04/2017   Procedure: COLONOSCOPY WITH PROPOFOL;  Surgeon: [name], MD;  Location: [facility];  Service: Endoscopy;  Laterality: N/A;   ESOPHAGOGASTRODUODENOSCOPY (EGD) WITH PROPOFOL N/A 11/23/2015   Procedure: ESOPHAGOGASTRODUODENOSCOPY (EGD) WITH PROPOFOL;  Surgeon: [name], MD;  Location: [facility];  Service: Endoscopy;  Laterality: N/A;   HAND SURGERY Left    PERIPHERAL VASCULAR CATHETERIZATION N/A 11/17/2015   Procedure: Upper Extremity Angiography;  Surgeon: Wellington Hampshire, MD;  Location: [facility];  Service: Cardiovascular;  Laterality: N/A;   POLYPECTOMY  05/04/2017   Procedure: POLYPECTOMY;  Surgeon: [name], MD;  Location: [facility];  Service: Endoscopy;;    FAMILY HISTORY :   Family History  Problem Relation Age of Onset   Leukemia Mother     SOCIAL HISTORY:   Social History   Tobacco Use   Smoking status: Former    Packs/day: 1.00    Years: 20.00    Total pack years: 20.00    Types: Cigarettes    Quit date: 08/30/2004    Years since quitting: 17.8    Passive exposure: Past   Smokeless tobacco: Never  Vaping Use   Vaping Use: Never used  Substance Use Topics   Alcohol use: No   Drug use: No    ALLERGIES:  is allergic to allopurinol and probenecid.  MEDICATIONS:  Current Outpatient Medications  Medication Sig Dispense Refill   albuterol (VENTOLIN HFA) 108 (90 Base) MCG/ACT inhaler Inhale into the lungs as needed.     atorvastatin (LIPITOR) 80 MG tablet Take 80 mg by mouth daily.     azaTHIOprine (IMURAN) 50 MG tablet Take 50 mg by mouth daily.     colchicine 0.6 MG tablet Take 0.6 mg by mouth daily.     [facility] NASAL ALLERGY SPRAY 55 MCG/ACT AERO nasal inhaler SMARTSIG:2 Spray(s) Both Nares Daily     diltiazem (CARDIZEM CD) 120 MG 24 hr capsule Take 1 capsule (120 mg total) by mouth 2 (two) times daily. 180 capsule 1   enalapril (VASOTEC) 20 MG tablet Take 1 tablet (20 mg total) by mouth 2  (two) times daily. 60 tablet 0   febuxostat (ULORIC) 40 MG tablet Take 40 mg by mouth daily.     fluticasone (FLONASE) 50 MCG/ACT nasal spray Place into the nose as needed.     fluticasone-salmeterol (ADVAIR) 250-50 MCG/ACT AEPB Inhale into the lungs daily.     hydrocortisone 2.5 % cream Apply 1 Application topically 2 (two) times daily.     hydroxychloroquine (PLAQUENIL) 200 MG tablet Take 1 tablet by mouth 2 (two) times daily.     ketoconazole (NIZORAL) 2 % shampoo Apply topically 3 (three) times a week.     metoprolol tartrate (LOPRESSOR) 100 MG tablet Take 1 tablet (100 mg total) by mouth 2 (two) times daily. 180 tablet 3   Multiple Vitamin (MULTI-VITAMINS) TABS Take by mouth.     mycophenolate (CELLCEPT) 500 MG tablet Take 2 tablets by mouth 2 (two) times daily.     pantoprazole (PROTONIX) 40 MG tablet Take 40 mg by mouth 2 (two) times daily.     potassium chloride SA (KLOR-CON M) 20 MEQ tablet TAKE 1 TABLET EVERY DAY 90 tablet 3   predniSONE (DELTASONE)  5 MG tablet Take 10 mg by mouth daily with breakfast.     sildenafil (VIAGRA) 50 MG tablet Take 1-2 tablets (50-100 mg total) by mouth daily as needed for erectile dysfunction. 30 tablet 11   tamsulosin (FLOMAX) 0.4 MG CAPS capsule Take 1 capsule (0.4 mg total) by mouth daily. 90 capsule 3   torsemide (DEMADEX) 20 MG tablet Take 0.5 tablets (10 mg total) by mouth daily. 45 tablet 2   warfarin (COUMADIN) 5 MG tablet TAKE 1 TABLET (5 MG TOTAL) BY MOUTH DAILY. 90 tablet 1   cefdinir (OMNICEF) 300 MG capsule Take 300 mg by mouth 2 (two) times daily. (Patient not taking: Reported on 07/11/2022)     No current facility-administered medications for this visit.    PHYSICAL EXAMINATION:   BP (!) 145/84 (BP Location: Left Arm, Patient Position: Sitting)   Pulse 73   Temp (!) 97.5 F (36.4 C) (Tympanic)   Resp 18   Wt 254 lb (115.2 kg)   BMI 36.45 kg/m   Filed Weights   07/11/22 1300  Weight: 254 lb (115.2 kg)   Physical Exam HENT:      Head: Normocephalic and atraumatic.     Mouth/Throat:     Pharynx: No oropharyngeal exudate.  Eyes:     Pupils: Pupils are equal, round, and reactive to light.  Cardiovascular:     Rate and Rhythm: Normal rate and regular rhythm.  Pulmonary:     Effort: Pulmonary effort is normal. No respiratory distress.     Breath sounds: Normal breath sounds. No wheezing.  Abdominal:     General: Bowel sounds are normal. There is no distension.     Palpations: Abdomen is soft. There is no mass.     Tenderness: There is no abdominal tenderness. There is no guarding or rebound.  Musculoskeletal:        General: No tenderness. Normal range of motion.     Cervical back: Normal range of motion and neck supple.  Skin:    General: Skin is warm.  Neurological:     Mental Status: He is alert and oriented to person, place, and time.  Psychiatric:        Mood and Affect: Affect normal.     LABORATORY DATA:  I have reviewed the data as listed    Component Value Date/Time   NA 140 04/08/2022 1804   NA 143 04/07/2021 0939   NA 137 08/27/2014 0403   K 3.6 04/08/2022 1804   K 4.1 08/27/2014 0403   CL 109 04/08/2022 1804   CL 108 08/27/2014 0403   CO2 21 (L) 04/08/2022 1804   CO2 26 08/27/2014 0403   GLUCOSE 105 (H) 04/08/2022 1804   GLUCOSE 94 08/27/2014 0403   BUN 20 04/08/2022 1804   BUN 22 04/07/2021 0939   BUN 16 08/27/2014 0403   CREATININE 1.25 (H) 04/08/2022 1804   CREATININE 0.85 08/27/2014 0403   CALCIUM 9.0 04/08/2022 1804   CALCIUM 8.6 (L) 08/27/2014 0403   PROT 6.6 04/18/2021 0908   ALBUMIN 4.1 04/18/2021 0908   AST 14 04/18/2021 0908   ALT 16 04/18/2021 0908   ALKPHOS 53 04/18/2021 0908   BILITOT 0.3 04/18/2021 0908   GFRNONAA >60 04/08/2022 1804   GFRNONAA >60 08/27/2014 0403   GFRAA >60 11/25/2019 0958   GFRAA >60 08/27/2014 0403    No results found for: "SPEP", "UPEP"  Lab Results  Component Value Date   WBC 9.7 07/11/2022   NEUTROABS 7.9 (H)  07/11/2022   HGB  14.1 07/11/2022   HCT 45.6 07/11/2022   MCV 86.2 07/11/2022   PLT 152 07/11/2022      Chemistry      Component Value Date/Time   NA 140 04/08/2022 1804   NA 143 04/07/2021 0939   NA 137 08/27/2014 0403   K 3.6 04/08/2022 1804   K 4.1 08/27/2014 0403   CL 109 04/08/2022 1804   CL 108 08/27/2014 0403   CO2 21 (L) 04/08/2022 1804   CO2 26 08/27/2014 0403   BUN 20 04/08/2022 1804   BUN 22 04/07/2021 0939   BUN 16 08/27/2014 0403   CREATININE 1.25 (H) 04/08/2022 1804   CREATININE 0.85 08/27/2014 0403      Component Value Date/Time   CALCIUM 9.0 04/08/2022 1804   CALCIUM 8.6 (L) 08/27/2014 0403   ALKPHOS 53 04/18/2021 0908   AST 14 04/18/2021 0908   ALT 16 04/18/2021 0908   BILITOT 0.3 04/18/2021 0908       RADIOGRAPHIC STUDIES: I have personally reviewed the radiological images as listed and agreed with the findings in the report. No results found.   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  # Hemoglobin today 14.No evidence of hemolysis. JAN 2023- Ferritin- 27; iron sat- 21%-recommend gentle iron. stable  # GFR- 59-monitor for now.stable  # Chronic respiratory failure- pulmonary fibrosis/sarcoidosis[Dr.Fleming]- on Home O2- stable.   #f A. fib on Coumadin- stable  # DISPOSITION: # follow up in 6 months MD/-CBC/LDH/BMP; iron studies;ferritin--Dr.B    No orders of the defined types were placed in this encounter.  All questions were answered. The patient knows to call the clinic with any problems, questions or concerns.      Cammie Sickle, MD 07/11/2022 1:28 PM

## 2022-07-11 NOTE — Assessment & Plan Note (Signed)
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  # Hemoglobin today 14.No evidence of hemolysis. JAN 2023- Ferritin- 27; iron sat- 21%-recommend gentle iron. stable  # GFR- 59-monitor for now.stable  # Chronic respiratory failure- pulmonary fibrosis/sarcoidosis[Dr.Fleming]- on Home O2- stable.   #f A. fib on Coumadin- stable  # DISPOSITION: # follow up in 6 months MD/-CBC/LDH/BMP; iron studies;ferritin--Dr.B

## 2022-07-14 ENCOUNTER — Telehealth: Payer: Self-pay | Admitting: *Deleted

## 2022-07-14 NOTE — Telephone Encounter (Signed)
-----   Message from Cammie Sickle, MD sent at 07/12/2022  2:19 PM EST ----- FYI- ---   Hi Mr. Hauger iron levels are slightly low.  I would recommend that you take iron pill once a day as discussed.  Also since your last colonoscopy was in 2018; I would recommend follow-up with GI doctor re: to find out why your iron levels are running low.  Please call us if you need a referral to a GI doctor. Call us if any questions. Thank you, GB

## 2022-07-19 ENCOUNTER — Other Ambulatory Visit: Payer: Self-pay | Admitting: Nurse Practitioner

## 2022-07-25 ENCOUNTER — Encounter: Admission: RE | Payer: Self-pay | Source: Home / Self Care

## 2022-07-25 ENCOUNTER — Ambulatory Visit: Admission: RE | Admit: 2022-07-25 | Payer: Medicare HMO | Source: Home / Self Care | Admitting: Otolaryngology

## 2022-07-25 SURGERY — SINUS SURGERY, WITH IMAGING GUIDANCE
Anesthesia: General | Laterality: Left

## 2022-08-02 ENCOUNTER — Other Ambulatory Visit: Payer: Self-pay | Admitting: Urology

## 2022-08-02 ENCOUNTER — Ambulatory Visit: Payer: Medicare HMO | Attending: Cardiovascular Disease

## 2022-08-02 DIAGNOSIS — I4892 Unspecified atrial flutter: Secondary | ICD-10-CM | POA: Diagnosis not present

## 2022-08-02 DIAGNOSIS — Z5181 Encounter for therapeutic drug level monitoring: Secondary | ICD-10-CM | POA: Diagnosis not present

## 2022-08-02 LAB — POCT INR: INR: 1.8 — AB (ref 2.0–3.0)

## 2022-08-02 NOTE — Patient Instructions (Signed)
Description   Take 1.5 tablet today, then start taking 1 tablet every day EXCEPT 1/2 TABLET ONLY ON MONDAYS.  Recheck in 4 weeks Coumadin Clinic [phone] - call if you are put on any antibiotics or steroids

## 2022-08-29 ENCOUNTER — Encounter: Payer: Self-pay | Admitting: Internal Medicine

## 2022-08-29 ENCOUNTER — Ambulatory Visit (INDEPENDENT_AMBULATORY_CARE_PROVIDER_SITE_OTHER): Payer: Medicare HMO | Admitting: Internal Medicine

## 2022-08-29 VITALS — BP 126/78 | HR 68 | Temp 98.1°F | Ht 70.0 in | Wt 251.2 lb

## 2022-08-29 DIAGNOSIS — R7303 Prediabetes: Secondary | ICD-10-CM

## 2022-08-29 DIAGNOSIS — J320 Chronic maxillary sinusitis: Secondary | ICD-10-CM | POA: Diagnosis not present

## 2022-08-29 DIAGNOSIS — M351 Other overlap syndromes: Secondary | ICD-10-CM

## 2022-08-29 DIAGNOSIS — M1A09X0 Idiopathic chronic gout, multiple sites, without tophus (tophi): Secondary | ICD-10-CM

## 2022-08-29 DIAGNOSIS — J8410 Pulmonary fibrosis, unspecified: Secondary | ICD-10-CM

## 2022-08-29 DIAGNOSIS — D591 Autoimmune hemolytic anemia, unspecified: Secondary | ICD-10-CM

## 2022-08-29 DIAGNOSIS — Z560 Unemployment, unspecified: Secondary | ICD-10-CM | POA: Diagnosis not present

## 2022-08-29 DIAGNOSIS — N401 Enlarged prostate with lower urinary tract symptoms: Secondary | ICD-10-CM | POA: Diagnosis not present

## 2022-08-29 DIAGNOSIS — I4821 Permanent atrial fibrillation: Secondary | ICD-10-CM

## 2022-08-29 DIAGNOSIS — N138 Other obstructive and reflux uropathy: Secondary | ICD-10-CM

## 2022-08-29 DIAGNOSIS — J329 Chronic sinusitis, unspecified: Secondary | ICD-10-CM | POA: Insufficient documentation

## 2022-08-29 DIAGNOSIS — E782 Mixed hyperlipidemia: Secondary | ICD-10-CM | POA: Insufficient documentation

## 2022-08-29 MED ORDER — CEPHALEXIN 500 MG PO CAPS: 500.0000 mg | ORAL_CAPSULE | Freq: Four times a day (QID) | ORAL | 0 refills | Status: AC

## 2022-08-29 MED ORDER — ATORVASTATIN CALCIUM 20 MG PO TABS: 20.0000 mg | ORAL_TABLET | Freq: Every day | ORAL | 1 refills | Status: DC

## 2022-08-29 NOTE — Assessment & Plan Note (Signed)
Stable symptoms on Advair and Albuterol

## 2022-08-29 NOTE — Assessment & Plan Note (Signed)
Followed by Dr. Posey Pronto On prednisone daily, Imuran, Plaquenil and Cellcept

## 2022-08-29 NOTE — Progress Notes (Signed)
Date:  08/29/2022   Name:  [name]   DOB:  [birth year]   MRN:  [id number]   Chief Complaint: New Patient (Initial Visit) and Nasal Congestion [employer]. Sinus Headache, and pressure. Started 6 days ago. No cough, no sob, and no fever.)  Sinus Problem This is a new problem. The current episode started in the past 7 days. There has been no fever. The pain is mild. Associated symptoms include congestion, sinus pressure and a sore throat. Pertinent negatives include no chills, coughing, headaches or shortness of breath.    Lab Results  Component Value Date   NA 137 07/11/2022   K 3.9 07/11/2022   CO2 25 07/11/2022   GLUCOSE 100 (H) 07/11/2022   BUN 22 07/11/2022   CREATININE 1.12 07/11/2022   CALCIUM 9.2 07/11/2022   EGFR 83 04/07/2021   GFRNONAA >60 07/11/2022   Lab Results  Component Value Date   CHOL 173 04/18/2021   HDL 49 04/18/2021   LDLCALC 92 04/18/2021   TRIG 187 (H) 04/18/2021   CHOLHDL 3.5 04/18/2021   Lab Results  Component Value Date   TSH 2.273 08/25/2014   Lab Results  Component Value Date   HGBA1C 5.9 03/26/2022   Lab Results  Component Value Date   WBC 9.7 07/11/2022   HGB 14.1 07/11/2022   HCT 45.6 07/11/2022   MCV 86.2 07/11/2022   PLT 152 07/11/2022   Lab Results  Component Value Date   ALT 16 04/18/2021   AST 14 04/18/2021   ALKPHOS 53 04/18/2021   BILITOT 0.3 04/18/2021   No results found for: "25OHVITD2", "25OHVITD3", "VD25OH"   Review of Systems  Constitutional:  Negative for chills, fatigue and fever.  HENT:  Positive for congestion, sinus pressure and sore throat. Negative for facial swelling and postnasal drip.   Respiratory:  Positive for chest tightness and wheezing. Negative for cough and shortness of breath.   Cardiovascular:  Negative for chest pain, palpitations and leg swelling.  Gastrointestinal:  Negative for abdominal pain, constipation and diarrhea.  Genitourinary:  Negative for frequency and  urgency.  Neurological:  Negative for dizziness and headaches.  Psychiatric/Behavioral:  Negative for dysphoric mood and sleep disturbance. The patient is not nervous/anxious.     Patient Active Problem List   Diagnosis Date Noted   Not currently working due to disabled status 08/29/2022   BPH with obstruction/lower urinary tract symptoms 08/29/2022   Chronic sinusitis 08/29/2022   Mixed hyperlipidemia 08/29/2022   Autoimmune hemolytic anemia 08/29/2022   Immunosuppressed status 03/21/2022   Prediabetes 03/21/2022   Idiopathic chronic gout of multiple sites without tophus 09/13/2021   Systemic lupus erythematosus (SLE) in adult 07/23/2017   Vitamin D deficiency 07/23/2017   Polyp of sigmoid colon    Carpal tunnel syndrome 08/08/2016   Degenerative disc disease 08/08/2016   Sarcoidosis 04/19/2016   Permanent atrial fibrillation 03/21/2016   Hernia, hiatal    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/2015   Normal coronary arteries    Typical atrial flutter    Tachycardia-induced cardiomyopathy    Essential hypertension    Pulmonary fibrosis    Nontoxic uninodular goiter 04/20/2014   Mixed connective tissue disease 02/03/2014   Mediastinal adenopathy 11/02/2013    Allergies  Allergen Reactions   Allopurinol Diarrhea   Probenecid Other (See Comments) and Nausea Only    Other reaction(s): Headache    Past Surgical History:  Procedure Laterality Date   CARDIAC CATHETERIZATION N/A 12/03/2014   Procedure: Left Heart Cath  and Coronary Angiography;  Surgeon: Minna Merritts, MD;  Location: [facility];  Service: Cardiovascular;  Laterality: N/A;   COLONOSCOPY WITH PROPOFOL N/A 11/23/2015   Procedure: COLONOSCOPY WITH PROPOFOL;  Surgeon: [name], MD;  Location: [facility];  Service: Endoscopy;  Laterality: N/A;   COLONOSCOPY WITH PROPOFOL N/A 05/04/2017   Procedure: COLONOSCOPY WITH PROPOFOL;  Surgeon: [name], MD;  Location: [facility];  Service: Endoscopy;   Laterality: N/A;   ESOPHAGOGASTRODUODENOSCOPY (EGD) WITH PROPOFOL N/A 11/23/2015   Procedure: ESOPHAGOGASTRODUODENOSCOPY (EGD) WITH PROPOFOL;  Surgeon: [name], MD;  Location: [facility];  Service: Endoscopy;  Laterality: N/A;   HAND SURGERY Left    PERIPHERAL VASCULAR CATHETERIZATION N/A 11/17/2015   Procedure: Upper Extremity Angiography;  Surgeon: Wellington Hampshire, MD;  Location: [facility];  Service: Cardiovascular;  Laterality: N/A;   POLYPECTOMY  05/04/2017   Procedure: POLYPECTOMY;  Surgeon: [name], MD;  Location: [facility];  Service: Endoscopy;;    Social History   Tobacco Use   Smoking status: Former    Packs/day: 1.00    Years: 20.00    Additional pack years: 0.00    Total pack years: 20.00    Types: Cigarettes    Quit date: 08/21/1998    Years since quitting: 24.0    Passive exposure: Past   Smokeless tobacco: Never  Vaping Use   Vaping Use: Never used  Substance Use Topics   Alcohol use: No   Drug use: No     Medication list has been reviewed and updated.  Current Meds  Medication Sig   albuterol (VENTOLIN HFA) 108 (90 Base) MCG/ACT inhaler Inhale into the lungs as needed.   azaTHIOprine (IMURAN) 50 MG tablet Take 50 mg by mouth daily.   cephALEXin (KEFLEX) 500 MG capsule Take 1 capsule (500 mg total) by mouth 4 (four) times daily for 10 days.   colchicine 0.6 MG tablet Take 0.6 mg by mouth daily.   [employer] NASAL ALLERGY SPRAY 55 MCG/ACT AERO nasal inhaler SMARTSIG:2 Spray(s) Both Nares Daily   diltiazem (CARDIZEM CD) 120 MG 24 hr capsule Take 1 capsule (120 mg total) by mouth 2 (two) times daily.   enalapril (VASOTEC) 20 MG tablet Take 1 tablet (20 mg total) by mouth 2 (two) times daily.   febuxostat (ULORIC) 40 MG tablet Take 40 mg by mouth daily.   fluticasone (FLONASE) 50 MCG/ACT nasal spray Place into the nose as needed.   fluticasone-salmeterol (ADVAIR) 250-50 MCG/ACT AEPB Inhale into the lungs daily.   hydrocortisone 2.5 %  cream Apply 1 Application topically 2 (two) times daily.   hydroxychloroquine (PLAQUENIL) 200 MG tablet Take 1 tablet by mouth 2 (two) times daily.   ketoconazole (NIZORAL) 2 % shampoo Apply topically 3 (three) times a week.   metoprolol tartrate (LOPRESSOR) 100 MG tablet Take 1 tablet (100 mg total) by mouth 2 (two) times daily.   Multiple Vitamin (MULTI-VITAMINS) TABS Take by mouth.   mycophenolate (CELLCEPT) 500 MG tablet Take 2 tablets by mouth 2 (two) times daily.   pantoprazole (PROTONIX) 40 MG tablet Take 40 mg by mouth 2 (two) times daily.   potassium chloride SA (KLOR-CON M) 20 MEQ tablet TAKE 1 TABLET EVERY DAY   predniSONE (DELTASONE) 5 MG tablet Take 10 mg by mouth daily with breakfast.   sildenafil (VIAGRA) 50 MG tablet Take 1-2 tablets (50-100 mg total) by mouth daily as needed for erectile dysfunction.   tamsulosin (FLOMAX) 0.4 MG CAPS capsule Take  1 capsule (0.4 mg total) by mouth daily.   torsemide (DEMADEX) 20 MG tablet Take 0.5 tablets (10 mg total) by mouth daily.   warfarin (COUMADIN) 5 MG tablet TAKE 1 TABLET (5 MG TOTAL) BY MOUTH DAILY.   [DISCONTINUED] atorvastatin (LIPITOR) 80 MG tablet Take 80 mg by mouth daily.       08/29/2022    2:44 PM  GAD 7 : Generalized Anxiety Score  Nervous, Anxious, on Edge 1  Control/stop worrying 0  Worry too much - different things 1  Trouble relaxing 0  Restless 0  Easily annoyed or irritable 0  Afraid - awful might happen 0  Total GAD 7 Score 2  Anxiety Difficulty Not difficult at all       08/29/2022    2:44 PM  Depression screen PHQ 2/9  Decreased Interest 0  Down, Depressed, Hopeless 3  PHQ - 2 Score 3  Altered sleeping 0  Tired, decreased energy 0  Change in appetite 0  Feeling bad or failure about yourself  0  Trouble concentrating 0  Moving slowly or fidgety/restless 0  Suicidal thoughts 0  PHQ-9 Score 3  Difficult doing work/chores Not difficult at all    BP Readings from Last 3 Encounters:  08/29/22 126/78   07/11/22 (!) 145/84  06/13/22 132/80    Physical Exam Constitutional:      Appearance: He is well-developed. He is obese.  HENT:     Right Ear: Tympanic membrane, ear canal and external ear normal. Tympanic membrane is not erythematous or retracted.     Left Ear: Tympanic membrane, ear canal and external ear normal. Tympanic membrane is not erythematous or retracted.     Nose:     Right Sinus: Maxillary sinus tenderness present. No frontal sinus tenderness.     Left Sinus: Maxillary sinus tenderness present. No frontal sinus tenderness.     Mouth/Throat:     Mouth: No oral lesions.     Pharynx: Uvula midline. Posterior oropharyngeal erythema present. No oropharyngeal exudate.  Cardiovascular:     Rate and Rhythm: Normal rate. Rhythm irregular.     Heart sounds: Normal heart sounds. No murmur heard. Pulmonary:     Breath sounds: Normal breath sounds. Decreased air movement present. No wheezing, rhonchi or rales.  Musculoskeletal:     Cervical back: Normal range of motion.  Lymphadenopathy:     Cervical: No cervical adenopathy.  Neurological:     Mental Status: He is alert and oriented to person, place, and time.     Wt Readings from Last 3 Encounters:  08/29/22 251 lb 3.2 oz (113.9 kg)  07/11/22 254 lb (115.2 kg)  06/13/22 255 lb (115.7 kg)    BP 126/78   Pulse 68   Temp 98.1 F (36.7 C) (Oral)   Ht 5\' 10"  (1.778 m)   Wt 251 lb 3.2 oz (113.9 kg)   SpO2 93%   BMI 36.04 kg/m   Assessment and Plan:  Problem List Items Addressed This Visit       Cardiovascular and Mediastinum   Permanent atrial fibrillation    Followed by Cardiology On warfarin - adjusted by warfarin clinic No bleeding issues - cancelled sinus surgery due to concerns about coming of warfarin      Relevant Medications   atorvastatin (LIPITOR) 20 MG tablet     Respiratory   Chronic sinusitis - Primary    Dr. Pryor Ochoa - surgery recommended but cancelled due to concerns about being off of  warfarin  He will continue Flonase and antihistamines      Relevant Medications   cephALEXin (KEFLEX) 500 MG capsule   Pulmonary fibrosis    Stable symptoms on Advair and Albuterol        Genitourinary   BPH with obstruction/lower urinary tract symptoms    Currently on Flomax with improved BPH sx PSA and DRE monitored by Urology        Other   Autoimmune hemolytic anemia   Idiopathic chronic gout of multiple sites without tophus    On colchicine and Uloric with good control Also improved with diet changes      Mixed connective tissue disease    Followed by Dr. Posey Pronto On prednisone daily, Imuran, Plaquenil and Cellcept      Mixed hyperlipidemia    Last LDL 107 but in view of HTN/CAD he should be on a statin. Atorvastatin 80 mg prescribed but he never took it. He agrees to try it - will start 20 mg daily and follow up for CPX      Relevant Medications   atorvastatin (LIPITOR) 20 MG tablet   Not currently working due to disabled status   Prediabetes    Managed with diet alone Likely exacerbated by chronic prednisone use Lab Results  Component Value Date   HGBA1C 5.9 03/26/2022         Return in about 4 months (around 12/29/2022) for CPX.   Partially dictated using [place], any errors are not intentional.  Glean Hess, MD [place], [place]

## 2022-08-29 NOTE — Assessment & Plan Note (Signed)
Managed with diet alone Likely exacerbated by chronic prednisone use Lab Results  Component Value Date   HGBA1C 5.9 03/26/2022

## 2022-08-29 NOTE — Assessment & Plan Note (Signed)
Last LDL 107 but in view of HTN/CAD he should be on a statin. Atorvastatin 80 mg prescribed but he never took it. He agrees to try it - will start 20 mg daily and follow up for CPX

## 2022-08-29 NOTE — Assessment & Plan Note (Signed)
Currently on Flomax with improved BPH sx PSA and DRE monitored by Urology

## 2022-08-29 NOTE — Assessment & Plan Note (Signed)
Followed by Cardiology On warfarin - adjusted by warfarin clinic No bleeding issues - cancelled sinus surgery due to concerns about coming of warfarin

## 2022-08-29 NOTE — Assessment & Plan Note (Signed)
Dr. Pryor Ochoa - surgery recommended but cancelled due to concerns about being off of warfarin He will continue Flonase and antihistamines

## 2022-08-29 NOTE — Assessment & Plan Note (Signed)
On colchicine and Uloric with good control Also improved with diet changes

## 2022-08-30 ENCOUNTER — Ambulatory Visit: Payer: Medicare HMO | Attending: Cardiovascular Disease

## 2022-08-30 DIAGNOSIS — Z5181 Encounter for therapeutic drug level monitoring: Secondary | ICD-10-CM | POA: Diagnosis not present

## 2022-08-30 DIAGNOSIS — I4892 Unspecified atrial flutter: Secondary | ICD-10-CM | POA: Diagnosis not present

## 2022-08-30 LAB — POCT INR: INR: 2.8 (ref 2.0–3.0)

## 2022-08-30 NOTE — Patient Instructions (Signed)
Description   Continue on same dosage 1 tablet every day EXCEPT 1/2 TABLET ONLY ON MONDAYS.  Recheck in 5 weeks [facility] [phone] - call if you are put on any antibiotics or steroids

## 2022-09-07 ENCOUNTER — Other Ambulatory Visit: Payer: Self-pay | Admitting: Cardiovascular Disease

## 2022-09-07 NOTE — Telephone Encounter (Signed)
Refill request for warfarin:  Last INR was 2.8 on 08/30/22 Next INR due 10/04/22 LOV was 06/13/22  Concha Se MD  Refill approved.

## 2022-09-07 NOTE — Telephone Encounter (Signed)
Refill request

## 2022-09-30 ENCOUNTER — Other Ambulatory Visit: Payer: Self-pay | Admitting: Cardiovascular Disease

## 2022-10-02 NOTE — Telephone Encounter (Signed)
last visit  06/13/2022--You will need a follow up appointment in 12 months  next visit--none

## 2022-10-04 ENCOUNTER — Ambulatory Visit: Payer: Medicare HMO | Attending: Cardiovascular Disease | Admitting: *Deleted

## 2022-10-04 ENCOUNTER — Other Ambulatory Visit: Payer: Self-pay | Admitting: Urology

## 2022-10-04 DIAGNOSIS — N138 Other obstructive and reflux uropathy: Secondary | ICD-10-CM

## 2022-10-04 DIAGNOSIS — Z5181 Encounter for therapeutic drug level monitoring: Secondary | ICD-10-CM | POA: Diagnosis not present

## 2022-10-04 DIAGNOSIS — I4892 Unspecified atrial flutter: Secondary | ICD-10-CM

## 2022-10-04 LAB — POCT INR: INR: 2.9 (ref 2.0–3.0)

## 2022-10-04 NOTE — Patient Instructions (Signed)
Continue on same dosage 1 tablet every day EXCEPT 1/2 TABLET ONLY ON MONDAYS.  Recheck in 6 weeks [facility] [phone] - call if you are put on any antibiotics or steroids

## 2022-10-11 ENCOUNTER — Telehealth: Payer: Self-pay | Admitting: Internal Medicine

## 2022-10-11 NOTE — Telephone Encounter (Signed)
Contacted [name] to schedule their annual wellness visit. Appointment made for 10/26/2022.  [name]; Care Guide Ambulatory Clinical Support [facility] Direct Dial: [phone]

## 2022-10-22 ENCOUNTER — Ambulatory Visit
Admission: EM | Admit: 2022-10-22 | Discharge: 2022-10-22 | Disposition: A | Discharge status: Home / Self Care | Payer: Medicare HMO | Attending: Physician Assistant | Admitting: Physician Assistant

## 2022-10-22 ENCOUNTER — Ambulatory Visit (INDEPENDENT_AMBULATORY_CARE_PROVIDER_SITE_OTHER): Payer: Medicare HMO

## 2022-10-22 ENCOUNTER — Telehealth: Payer: Self-pay | Admitting: Physician Assistant

## 2022-10-22 ENCOUNTER — Encounter: Payer: Self-pay | Admitting: Internal Medicine

## 2022-10-22 DIAGNOSIS — M25511 Pain in right shoulder: Secondary | ICD-10-CM | POA: Diagnosis not present

## 2022-10-22 DIAGNOSIS — S62396A Other fracture of fifth metacarpal bone, right hand, initial encounter for closed fracture: Secondary | ICD-10-CM | POA: Diagnosis not present

## 2022-10-22 DIAGNOSIS — S61411A Laceration without foreign body of right hand, initial encounter: Secondary | ICD-10-CM

## 2022-10-22 DIAGNOSIS — M79642 Pain in left hand: Secondary | ICD-10-CM

## 2022-10-22 DIAGNOSIS — S80211A Abrasion, right knee, initial encounter: Secondary | ICD-10-CM | POA: Diagnosis not present

## 2022-10-22 DIAGNOSIS — W19XXXA Unspecified fall, initial encounter: Secondary | ICD-10-CM

## 2022-10-22 NOTE — ED Triage Notes (Signed)
Pt states that he fell this morning, injured right side of body. Complaint shoulder, hand and knee.

## 2022-10-22 NOTE — Telephone Encounter (Signed)
error 

## 2022-10-22 NOTE — ED Provider Notes (Signed)
[facility]    CSN: [id number] Arrival date & time: 10/22/22  0813      History   Chief Complaint Chief Complaint  Patient presents with  . Fall    HPI [name] is a 62 y.o. male with history of lupus, atrial flutter/fibrillation, cardiomyopathy, rheumatoid arthritis, pulmonary sarcoidosis, pulmonary fibrosis, hypertension, gout, GERD, anemia and long-term use of anticoagulants.  Patient presents today for injuries related to an accidental fall that occurred this morning.  He says he went to pick up trash off his lawn when he stepped back on the driveway he lost his balance and fall forward.  He reports putting both of his hands out to brace his fall.  He did not sustain a head injury or loss of consciousness.  He has been having right shoulder, hand and knee pain. Injuries occurred about 1.5 hours ago.  Patient has swelling of the right hand with skin tear of palm, abrasion of right knee, swelling and bruising of left hand about the thumb and right shoulder pain.  Patient reports most pain of his hands.  He does have full range of motion of his shoulder.  Wounds have not been cleaned yet. Up to date with tetanus.  HPI  Past Medical History:  Diagnosis Date  . Anemia 10/2015  . Discoid lupus   . 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 Gangrene of finger (HCC)   . GERD (gastroesophageal reflux disease)   . Gout   . Hemolytic anemia (HCC)   . History of nuclear stress test    a. 09/02/2014: no sig ischemia, GI uptake noted, no sig WMA, EF 48%, no EKG chanes concerning for ischemia, low risk scan    . HTN (hypertension)   . Normal coronary arteries    a. cardiac cath 12/03/2014: no significant CAD, right dominant system, LVEF >55%, no MR or AS  . Paroxysmal atrial flutter (HCC)    a. s/p successful TEE/DCCV on 08/28/2014-->was on xarelto, now on warfarin.  . Pulmonary fibrosis (HCC)    a. not on home oxygen  . Pulmonary sarcoidosis (HCC)   . RA  (rheumatoid arthritis) (HCC)   . 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 Also on cellcept 500 BID [for connective tissue disorder]- also discussed that this should also help his hemolytic anemia.      # History of A. Fib- on Xare  . Tachycardia-induced cardiomyopathy (HCC)    a. echo 07/2014: EF 20-25%, cannot exclude atrial flutter,  global HK, possible bicuspid Ao valve, mod reduced RV sys fxn, mild-mod aortic valve sclerosis/calcification, mod TR, mildly elevated RVSP; b. TEE 08/28/2014: EF 30-35%, no intracardic thrombus, mildly dilated LA/RA, mild TR, mild-mod aortic sclerosis w/o stenosis; c. echo 09/2014: EF 55-60%, select images w/ inf HK; d. 10/2015 Echo: EF 60-65%  . Vasculitis (HCC) 04/03/2016    Patient Active Problem List   Diagnosis Date Noted  . Not currently working due to disabled status 08/29/2022  . BPH with obstruction/lower urinary tract symptoms 08/29/2022  . Chronic sinusitis 08/29/2022  . Mixed hyperlipidemia 08/29/2022  . 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  . Immunosuppressed status (HCC) 03/21/2022  . Prediabetes 03/21/2022  . Idiopathic chronic gout of multiple sites without tophus 09/13/2021  . Systemic lupus erythematosus (SLE) in adult (  HCC) 07/23/2017  . Vitamin D deficiency 07/23/2017  . Polyp of sigmoid colon   . Carpal tunnel syndrome 08/08/2016  . Degenerative disc disease 08/08/2016  . Sarcoidosis 04/19/2016  . Permanent atrial fibrillation (HCC) 03/21/2016  . Hernia, hiatal   . 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 Normal coronary arteries   . Typical atrial flutter (HCC)   . Tachycardia-induced cardiomyopathy (HCC)   . Essential hypertension   . Pulmonary fibrosis (HCC)   . Nontoxic uninodular goiter  04/20/2014  . Mixed connective tissue disease (HCC) 02/03/2014  . Mediastinal adenopathy 11/02/2013    Past Surgical History:  Procedure Laterality Date  . CARDIAC CATHETERIZATION N/A 12/03/2014   Procedure: Left Heart Cath and Coronary Angiography;  Surgeon: Antonieta Iba, MD;  Location: [facility];  Service: Cardiovascular;  Laterality: N/A;  . COLONOSCOPY WITH PROPOFOL N/A 11/23/2015   Procedure: COLONOSCOPY WITH PROPOFOL;  Surgeon: Midge Minium, MD;  Location: [facility];  Service: Endoscopy;  Laterality: N/A;  . COLONOSCOPY WITH PROPOFOL N/A 05/04/2017   Procedure: COLONOSCOPY WITH PROPOFOL;  Surgeon: Midge Minium, MD;  Location: [facility];  Service: Endoscopy;  Laterality: N/A;  . ESOPHAGOGASTRODUODENOSCOPY (EGD) WITH PROPOFOL N/A 11/23/2015   Procedure: ESOPHAGOGASTRODUODENOSCOPY (EGD) WITH PROPOFOL;  Surgeon: Midge Minium, MD;  Location: [facility];  Service: Endoscopy;  Laterality: N/A;  . HAND SURGERY Left   . PERIPHERAL VASCULAR CATHETERIZATION N/A 11/17/2015   Procedure: Upper Extremity Angiography;  Surgeon: Iran Ouch, MD;  Location: [facility];  Service: Cardiovascular;  Laterality: N/A;  . POLYPECTOMY  05/04/2017   Procedure: POLYPECTOMY;  Surgeon: Midge Minium, MD;  Location: [facility];  Service: Endoscopy;;       Home Medications    Prior to Admission medications   Medication Sig Start Date End Date Taking? Authorizing Provider  albuterol (VENTOLIN HFA) 108 (90 Base) MCG/ACT inhaler Inhale into the lungs as needed. 09/27/20  Yes [provider]  atorvastatin (LIPITOR) 20 MG tablet Take 1 tablet (20 mg total) by mouth daily. 08/29/22  Yes Reubin Milan, MD  azaTHIOprine (IMURAN) 50 MG tablet Take 50 mg by mouth daily. 06/22/21  Yes [provider]  colchicine 0.6 MG tablet Take 0.6 mg by mouth daily. 11/10/21  Yes [provider]  [employer] NASAL ALLERGY SPRAY 55 MCG/ACT AERO nasal inhaler  SMARTSIG:2 Spray(s) Both Nares Daily 12/01/21  Yes [provider]  diltiazem (CARDIZEM CD) 120 MG 24 hr capsule Take 1 capsule (120 mg total) by mouth 2 (two) times daily. 06/13/22  Yes Antonieta Iba, MD  enalapril (VASOTEC) 20 MG tablet Take 1 tablet (20 mg total) by mouth 2 (two) times daily. 06/13/22  Yes Gollan, Tollie Pizza, MD  febuxostat (ULORIC) 40 MG tablet Take 40 mg by mouth daily. 09/13/21  Yes [provider]  fluticasone (FLONASE) 50 MCG/ACT nasal spray Place into the nose as needed. 10/04/20  Yes [provider]  fluticasone-salmeterol (ADVAIR) 250-50 MCG/ACT AEPB Inhale into the lungs daily. 09/27/20  Yes [provider]  hydrocortisone 2.5 % cream Apply 1 Application topically 2 (two) times daily. 05/04/22  Yes [provider]  hydroxychloroquine (PLAQUENIL) 200 MG tablet Take 1 tablet by mouth 2 (two) times daily. 09/05/21  Yes [provider]  ketoconazole (NIZORAL) 2 % shampoo Apply topically 3 (three) times a week. 05/04/22  Yes [provider]  metoprolol tartrate (LOPRESSOR) 100 MG tablet Take 1 tablet (100 mg total) by mouth 2 (two) times daily. 06/19/22 06/14/23  Yes Antonieta Iba, MD  Multiple Vitamin (MULTI-VITAMINS) TABS Take by mouth.   Yes [provider]  mycophenolate (CELLCEPT) 500 MG tablet Take 2 tablets by mouth 2 (two) times daily. 11/09/21  Yes [provider]  pantoprazole (PROTONIX) 40 MG tablet Take 40 mg by mouth 2 (two) times daily.   Yes [provider]  potassium chloride SA (KLOR-CON M) 20 MEQ tablet TAKE 1 TABLET EVERY DAY 07/19/22  Yes Gollan, Tollie Pizza, MD  sildenafil (VIAGRA) 50 MG tablet Take 1-2 tablets (50-100 mg total) by mouth daily as needed for erectile dysfunction. 09/14/21  Yes Sondra Come, MD  tamsulosin (FLOMAX) 0.4 MG CAPS capsule TAKE 1 CAPSULE BY MOUTH EVERY DAY 10/04/22  Yes Sondra Come, MD  torsemide (DEMADEX) 20 MG tablet TAKE 1/2 TABLET EVERY DAY  10/02/22  Yes Gollan, Tollie Pizza, MD  warfarin (COUMADIN) 5 MG tablet TAKE 1 TABLET (5 MG TOTAL) BY MOUTH DAILY. 09/07/22  Yes Antonieta Iba, MD  predniSONE (DELTASONE) 5 MG tablet Take 10 mg by mouth daily with breakfast. 06/07/20   [provider]    Family History Family History  Problem Relation Age of Onset  . Leukemia Mother     Social History Social History   Tobacco Use  . Smoking status: Former    Packs/day: 1.00    Years: 20.00    Additional pack years: 0.00    Total pack years: 20.00    Types: Cigarettes    Quit date: 08/21/1998    Years since quitting: 24.1    Passive exposure: Past  . Smokeless tobacco: Never  Vaping Use  . Vaping Use: Never used  Substance Use Topics  . Alcohol use: No  . Drug use: No     Allergies   Allopurinol and Probenecid   Review of Systems Review of Systems  Constitutional:  Negative for fatigue.  Eyes:  Negative for photophobia and visual disturbance.  Respiratory:  Negative for shortness of breath.   Cardiovascular:  Negative for chest pain and palpitations.  Gastrointestinal:  Negative for abdominal distention and vomiting.  Musculoskeletal:  Positive for arthralgias and joint swelling. Negative for back pain and neck pain.  Skin:  Positive for color change and wound.  Neurological:  Negative for dizziness, syncope, facial asymmetry, speech difficulty, weakness, light-headedness, numbness and headaches.     Physical Exam Triage Vital Signs ED Triage Vitals [10/22/22 0821]  Enc Vitals Group     BP      Pulse      Resp      Temp      Temp src      SpO2      Weight 245 lb (111.1 kg)     Height      Head Circumference      Peak Flow      Pain Score 8     Pain Loc      Pain Edu?      Excl. in GC?    No data found.  Updated Vital Signs BP 132/72 (BP Location: Left Arm)   Pulse 68   Temp 97.8 F (36.6 C) (Oral)   Resp 18   Wt 245 lb (111.1 kg)   SpO2 96%   BMI 35.15 kg/m   Physical Exam Vitals  and nursing note reviewed.  Constitutional:      General: He is not in acute distress.    Appearance: Normal appearance. He is well-developed. He is not ill-appearing.  HENT:     Head: Normocephalic and atraumatic.     Nose: Nose normal.     Mouth/Throat:     Mouth: Mucous membranes are moist.     Pharynx: Oropharynx is clear.  Eyes:     General: No scleral icterus.    Extraocular Movements: Extraocular movements intact.     Conjunctiva/sclera: Conjunctivae normal.     Pupils: Pupils are equal, round, and reactive to light.  Cardiovascular:     Rate and Rhythm: Normal rate and regular rhythm.     Heart sounds: Normal heart sounds.  Pulmonary:     Effort: Pulmonary effort is normal. No respiratory distress.     Breath sounds: Normal breath sounds.  Musculoskeletal:     Cervical back: Neck supple.     Comments: RIGHT HAND: There is moderate swelling of the dorsal hand about the fourth and fifth metacarpals and tenderness in this area.  Mild swelling over the distal wrist without tenderness.  Good pulses.  Skin flap/skin tear of palm.  LEFT HAND: There is moderate swelling and ecchymosis of the palm and base of thumb and tenderness in this area as well.  Good pulses and strength.  RIGHT KNEE: No swelling but there is a large superficial abrasion of the anterior knee.  No bony tenderness.  Full range of motion of knee.  RIGHT SHOULDER: No swelling or deformity.  Tenderness over the [facility] joint and proximal humerus.  Full range of motion of shoulder.  Good strength.  Skin:    General: Skin is warm and dry.     Capillary Refill: Capillary refill takes less than 2 seconds.  Neurological:     General: No focal deficit present.     Mental Status: He is alert and oriented to person, place, and time. Mental status is at baseline.     Motor: No weakness.     Coordination: Coordination normal.     Gait: Gait normal.  Psychiatric:        Mood and Affect: Mood normal.        Behavior: Behavior  normal.     UC Treatments / Results  Labs (all labs ordered are listed, but only abnormal results are displayed) Labs Reviewed - No data to display  EKG   Radiology DG Hand Complete Left  Result Date: 10/22/2022 CLINICAL DATA:  Multiple injuries. Fall. Left hand pain on pad of hand. Bilateral hand abrasions. EXAM: LEFT HAND - COMPLETE 3+ VIEW COMPARISON:  None Available. FINDINGS: There is 5 mm ulnar negative variance. There is moderate downsloping of the distal medial radius towards the distal ulna. Minimal distal radioulnar joint space narrowing and peripheral spurring. Mild-to-moderate thumb carpometacarpal joint space narrowing, subchondral sclerosis, and peripheral osteophytosis. Mild triscaphe joint space narrowing and peripheral degenerative spurring. There is chronic appearing attenuation of the distal medial tip of the distal phalanx of the fifth finger with attenuation of the adjacent distal medial soft tissues, favored to represent the sequela of remote injury. Recommend clinical correlation for acute trauma in this region. Mild second through fifth DIP joint space narrowing diffusely. IMPRESSION: 1. Likely old bone defect at the distal tuft of the distal phalanx of the fifth finger with adjacent likely chronic soft tissue injury. Recommend clinical correlation for acute trauma in this region. No definite acute fracture. 2. Ulnar negative variance with moderate downsloping of the distal medial radius towards the distal ulna. 3. Mild-to-moderate thumb carpometacarpal and mild triscaphe osteoarthritis. Electronically Signed   By: Neita Garnet  M.D.   On: 10/22/2022 09:46   DG Hand Complete Right  Result Date: 10/22/2022 CLINICAL DATA:  Patient fell this morning. Multiple injuries. Right hand swelling with pain around the little finger. EXAM: RIGHT HAND - COMPLETE 3+ VIEW COMPARISON:  None Available. FINDINGS: Three views study shows a comminuted fracture involving the head of the fifth  metacarpal. Fracture is not well seen on the lateral projection given superimposition of bony anatomy but dominant fracture fragment appears displaced by 2-3 mm on oblique imaging. No other acute fracture evident. No subluxation or dislocation. IMPRESSION: Comminuted fracture involving the head of the fifth metacarpal. Electronically Signed   By: Kennith Center M.D.   On: 10/22/2022 09:24   DG Shoulder Right  Result Date: 10/22/2022 CLINICAL DATA:  Multiple falls.  Complains of shoulder pain. EXAM: RIGHT SHOULDER - 2+ VIEW COMPARISON:  None Available. FINDINGS: No signs of acute fracture or dislocation. Narrowing of the acromiohumeral interval is identified which may reflect sequelae of chronic rotator cuff injury the visualized right ribs appear intact. IMPRESSION: 1. No acute findings. 2. Narrowing of the acromiohumeral interval may reflect sequelae of chronic rotator cuff injury. Electronically Signed   By: Signa Kell M.D.   On: 10/22/2022 09:23    Procedures Laceration Repair  Date/Time: 10/22/2022 10:42 AM  Performed by: Shirlee Latch, PA-C Authorized by: Shirlee Latch, PA-C   Consent:    Consent obtained:  Verbal   Consent given by:  Patient   Risks discussed:  Infection and pain Universal protocol:    Patient identity confirmed:  Verbally with patient and arm band Anesthesia:    Anesthesia method:  None Laceration details:    Location:  Hand   Hand location:  R palm   Length (cm):  1.5 Exploration:    Contaminated: no   Treatment:    Amount of cleaning:  Standard   Irrigation solution:  Sterile saline   Visualized foreign bodies/material removed: no   Skin repair:    Repair method:  Tissue adhesive Approximation:    Approximation:  Close Repair type:    Repair type:  Simple Post-procedure details:    Dressing:  Non-adherent dressing   Procedure completion:  Tolerated well, no immediate complications  (including critical care time)  Medications Ordered in  UC Medications - No data to display  Initial Impression / Assessment and Plan / UC Course  I have reviewed the triage vital signs and the nursing notes.  Pertinent labs & imaging results that were available during my care of the patient were reviewed by me and considered in my medical decision making (see chart for details).   62 year old male with history of lupus, sarcoidosis, cardiomyopathy, hypertension, atrial flutter/fibrillation on long-term use of anticoagulants presents for multiple injuries following an accidental fall this morning.  He reports pain in his right hand, right shoulder and right knee.  He also has wounds and abrasions.  See chart.  Denies head injury or loss of consciousness.  All wounds cleaned thoroughly with antiseptic.  Flap laceration of right hand soaked and cleaned.  Able to pull the flap over and applied Dermabond to repair laceration.  Covered with nonadherent pad.  X-rays of hands and right shoulder obtained today.  X-ray right hand shows comminuted fracture of the right fifth metacarpal.  No other fractures identified.  Discussed all results with patient.  He was placed in a ulnar gutter splint.  Reviewed RICE guidelines and splint care guidelines.  Patient advised to take Tylenol  as needed for pain relief.  Advised to clean all wounds with soap and water except for the one on his right hand as it has been repaired with Dermabond.  Patient given contact information for orthopedics.  Patient denies head injury or loss of conscious.  Reports a brief episode of dizziness in the urgent care which resolved before he left urgent care.  Advised him that if he does have any headaches, vision changes, continued or worsening dizziness, chest pain, numbness/tingling or weakness to call 911 or go immediately to the emergency department.   Final Clinical Impressions(s) / UC Diagnoses   Final diagnoses:  Closed nondisplaced fracture of other part of fifth metacarpal bone of  right hand, initial encounter  Laceration of right hand without foreign body, initial encounter  Abrasion of right knee, initial encounter  Acute pain of right shoulder  Fall, initial encounter     Discharge Instructions      -You have a fracture of the right hand. Wear the splint like a cast and do not take it off or get it wet.  -Elevate extremity and take Tylenol for pain -I have used a skin adhesive to glue the flap laceration of your right hand. It will come off on its own in a few days. Just leave it in place -Clean your other wounds with soap and water and change bandages regularly. -I know you did not hit your head, but if you do have headaches, vision changes, vomiting, increased dizziness, etc please go to ER for head CT.  You have a condition requiring you to follow up with Orthopedics so please call one of the following office for appointment:   [facility] [address] [place], [place] [address] Phone: [phone]  [facility] [address], [place], [place] [address] Phone: [phone]      ED Prescriptions   None    I have reviewed the PDMP during this encounter.   Shirlee Latch, PA-C 10/22/22 1045

## 2022-10-22 NOTE — Discharge Instructions (Addendum)
-  You have a fracture of the right hand. Wear the splint like a cast and do not take it off or get it wet.  -Elevate extremity and take Tylenol for pain -I have used a skin adhesive to glue the flap laceration of your right hand. It will come off on its own in a few days. Just leave it in place -Clean your other wounds with soap and water and change bandages regularly. -I know you did not hit your head, but if you do have headaches, vision changes, vomiting, increased dizziness, etc please go to ER for head CT.  You have a condition requiring you to follow up with Orthopedics so please call one of the following office for appointment:   [facility] [address] [place], [place] [address] Phone: [phone]  [facility] [address], [place], [place] [address] Phone: [phone]

## 2022-10-26 ENCOUNTER — Ambulatory Visit (INDEPENDENT_AMBULATORY_CARE_PROVIDER_SITE_OTHER): Payer: Medicare HMO

## 2022-10-26 VITALS — Ht 70.0 in | Wt 251.0 lb

## 2022-10-26 DIAGNOSIS — Z0000 Encounter for general adult medical examination without abnormal findings: Secondary | ICD-10-CM

## 2022-10-26 NOTE — Progress Notes (Signed)
I connected with  Newt Minion on 10/26/22 by a audio enabled telemedicine application and verified that I am speaking with the correct person using two identifiers.  Patient Location: Home  Provider Location: Office/Clinic  I discussed the limitations of evaluation and management by telemedicine. The patient expressed understanding and agreed to proceed.  Subjective:   [name] is a 62 y.o. male who presents for Medicare Annual/Subsequent preventive examination.  Review of Systems     Cardiac Risk Factors include: advanced age (>36men, >22 women);hypertension;male gender;obesity (BMI >30kg/m2);sedentary lifestyle     Objective:    Today's Vitals   10/26/22 0859  PainSc: 5    There is no height or weight on file to calculate BMI.     10/26/2022    9:05 AM 07/11/2022    1:00 PM 04/08/2022    6:02 PM 12/06/2021   11:06 AM 06/07/2021   10:14 AM 12/06/2020   10:13 AM 06/08/2020   10:35 AM  Advanced Directives  Does Patient Have a Medical Advance Directive? No No No No No No No  Would patient like information on creating a medical advance directive? No - Patient declined No - Patient declined   No - Patient declined No - Patient declined No - Guardian declined    Current Medications (verified) Outpatient Encounter Medications as of 10/26/2022  Medication Sig   albuterol (VENTOLIN HFA) 108 (90 Base) MCG/ACT inhaler Inhale into the lungs as needed.   atorvastatin (LIPITOR) 20 MG tablet Take 1 tablet (20 mg total) by mouth daily.   azaTHIOprine (IMURAN) 50 MG tablet Take 50 mg by mouth daily.   colchicine 0.6 MG tablet Take 0.6 mg by mouth daily.   [employer] NASAL ALLERGY SPRAY 55 MCG/ACT AERO nasal inhaler SMARTSIG:2 Spray(s) Both Nares Daily   diltiazem (CARDIZEM CD) 120 MG 24 hr capsule Take 1 capsule (120 mg total) by mouth 2 (two) times daily.   enalapril (VASOTEC) 20 MG tablet Take 1 tablet (20 mg total) by mouth 2 (two) times daily.   febuxostat (ULORIC)  40 MG tablet Take 40 mg by mouth daily.   fluticasone (FLONASE) 50 MCG/ACT nasal spray Place into the nose as needed.   fluticasone-salmeterol (ADVAIR) 250-50 MCG/ACT AEPB Inhale into the lungs daily.   hydrocortisone 2.5 % cream Apply 1 Application topically 2 (two) times daily.   hydroxychloroquine (PLAQUENIL) 200 MG tablet Take 1 tablet by mouth 2 (two) times daily.   ketoconazole (NIZORAL) 2 % shampoo Apply topically 3 (three) times a week.   metoprolol tartrate (LOPRESSOR) 100 MG tablet Take 1 tablet (100 mg total) by mouth 2 (two) times daily.   Multiple Vitamin (MULTI-VITAMINS) TABS Take by mouth.   mycophenolate (CELLCEPT) 500 MG tablet Take 2 tablets by mouth 2 (two) times daily.   pantoprazole (PROTONIX) 40 MG tablet Take 40 mg by mouth 2 (two) times daily.   potassium chloride SA (KLOR-CON M) 20 MEQ tablet TAKE 1 TABLET EVERY DAY   predniSONE (DELTASONE) 5 MG tablet Take 10 mg by mouth daily with breakfast.   sildenafil (VIAGRA) 50 MG tablet Take 1-2 tablets (50-100 mg total) by mouth daily as needed for erectile dysfunction.   tamsulosin (FLOMAX) 0.4 MG CAPS capsule TAKE 1 CAPSULE BY MOUTH EVERY DAY   torsemide (DEMADEX) 20 MG tablet TAKE 1/2 TABLET EVERY DAY   warfarin (COUMADIN) 5 MG tablet TAKE 1 TABLET (5 MG TOTAL) BY MOUTH DAILY.   No facility-administered encounter medications on file as of 10/26/2022.  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 09/02/2014: no sig ischemia, GI uptake noted, no sig WMA, EF 48%, no EKG chanes concerning for ischemia, low risk scan     HTN (hypertension)    Normal coronary arteries    a. cardiac cath 12/03/2014: no significant CAD, right dominant system, LVEF >55%, no  MR or AS   Paroxysmal atrial flutter (HCC)    a. s/p successful TEE/DCCV on 08/28/2014-->was on xarelto, now on warfarin.   Pulmonary fibrosis (HCC)    a. 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 Also on cellcept 500 BID [for connective tissue disorder]- also discussed that this should also help his hemolytic anemia.      # History of A. Fib- on Xare   Tachycardia-induced cardiomyopathy (HCC)    a. echo 07/2014: EF 20-25%, cannot exclude atrial flutter,  global HK, possible bicuspid Ao valve, mod reduced RV sys fxn, mild-mod aortic valve sclerosis/calcification, mod TR, mildly elevated RVSP; b. TEE 08/28/2014: EF 30-35%, no intracardic thrombus, mildly dilated LA/RA, mild TR, mild-mod aortic sclerosis w/o stenosis; c. echo 09/2014: EF 55-60%, select images w/ inf HK; d. 10/2015 Echo: EF 60-65%   Vasculitis (HCC) 04/03/2016   Past Surgical History:  Procedure Laterality Date   CARDIAC CATHETERIZATION N/A 12/03/2014   Procedure: Left Heart Cath and Coronary Angiography;  Surgeon: Antonieta Iba, MD;  Location: [facility];  Service: Cardiovascular;  Laterality: N/A;   COLONOSCOPY WITH PROPOFOL N/A 11/23/2015   Procedure: COLONOSCOPY WITH PROPOFOL;  Surgeon: Midge Minium, MD;  Location: [facility] END[facility];  Service: Endoscopy;  Laterality: N/A;   COLONOSCOPY WITH PROPOFOL N/A 05/04/2017   Procedure: COLONOSCOPY WITH PROPOFOL;  Surgeon: Midge Minium, MD;  Location: [facility];  Service: Endoscopy;  Laterality: N/A;   ESOPHAGOGASTRODUODENOSCOPY (EGD) WITH PROPOFOL N/A 11/23/2015   Procedure: ESOPHAGOGASTRODUODENOSCOPY (EGD) WITH PROPOFOL;  Surgeon: Midge Minium,  MD;  Location: [facility] ENDOSCOPY;  Service: Endoscopy;  Laterality: N/A;   HAND SURGERY Left    PERIPHERAL VASCULAR CATHETERIZATION N/A 11/17/2015   Procedure: Upper Extremity Angiography;  Surgeon: Iran Ouch, MD;  Location: [facility];  Service: Cardiovascular;  Laterality: N/A;   POLYPECTOMY  05/04/2017   Procedure: POLYPECTOMY;  Surgeon: Midge Minium, MD;  Location: [facility];  Service: Endoscopy;;   Family History  Problem Relation Age of Onset   Leukemia Mother    Social History   Socioeconomic History   Marital status: Married    Spouse name: Not on file   Number of children: Not on file   Years of education: Not on file   Highest education level: Not on file  Occupational History   Not on file  Tobacco Use   Smoking status: Former    Packs/day: 1.00    Years: 20.00    Additional pack years: 0.00  Total pack years: 20.00    Types: Cigarettes    Quit date: 08/21/1998    Years since quitting: 24.1    Passive exposure: Past   Smokeless tobacco: Never  Vaping Use   Vaping Use: Never used  Substance and Sexual Activity   Alcohol use: No   Drug use: No   Sexual activity: Yes    Birth control/protection: None  Other Topics Concern   Not on file  Social History Narrative   Not on file   Social Determinants of Health   Financial Resource Strain: Low Risk  (10/26/2022)   Overall Financial Resource Strain (CARDIA)    Difficulty of Paying Living Expenses: Not hard at all  Food Insecurity: No Food Insecurity (10/26/2022)   Hunger Vital Sign    Worried About Running Out of Food in the Last Year: Never true    Ran Out of Food in the Last Year: Never true  Transportation Needs: No Transportation Needs (10/26/2022)   PRAPARE - Administrator, Civil Service (Medical): No    Lack of Transportation (Non-Medical): No  Physical Activity: Insufficiently Active (10/26/2022)   Exercise Vital Sign    Days of Exercise per Week: 2 days    Minutes of  Exercise per Session: 20 min  Stress: No Stress Concern Present (10/26/2022)   [employer] of Occupational Health - Occupational Stress Questionnaire    Feeling of Stress : Only a little  Social Connections: Moderately Isolated (10/26/2022)   Social Connection and Isolation Panel [NHANES]    Frequency of Communication with Friends and Family: More than three times a week    Frequency of Social Gatherings with Friends and Family: Twice a week    Attends Religious Services: Never    Database administrator or Organizations: No    Attends Engineer, structural: Never    Marital Status: Married    Tobacco Counseling Counseling given: Not Answered   Clinical Intake:  Pre-visit preparation completed: Yes  Pain : 0-10 Pain Score: 5  Pain Type: Acute pain Pain Location: Hand Pain Radiating Towards: pinky finger broken in 3 places     Nutritional Risks: None Diabetes: No  How often do you need to have someone help you when you read instructions, pamphlets, or other written materials from your doctor or pharmacy?: 1 - Never  Diabetic?no  Interpreter Needed?: No  Information entered by :: Kennedy Bucker, LPN   Activities of Daily Living    10/26/2022    9:07 AM 10/23/2022    7:36 AM  In your present state of health, do you have any difficulty performing the following activities:  Hearing? 0 0  Vision? 0 0  Difficulty concentrating or making decisions? 0 0  Walking or climbing stairs? 0 0  Dressing or bathing? 0 0  Doing errands, shopping? 0 0  Preparing Food and eating ? N Y  Using the Toilet? N Y  In the past six months, have you accidently leaked urine? N N  Do you have problems with loss of bowel control? N Y  Managing your Medications? N N  Managing your Finances? N N  Housekeeping or managing your Housekeeping? N Y    Patient Care Team: Reubin Milan, MD as PCP - General (Internal Medicine) Antonieta Iba, MD as PCP - Cardiology  (Cardiology) Antonieta Iba, MD as Consulting Physician (Cardiology) Earna Coder, MD as Consulting Physician (Oncology) Patterson Hammersmith, MD as Consulting Physician (Rheumatology) Ned Clines  E, MD as Referring Physician (Pulmonary Disease) Jesusita Oka, MD as Referring Physician (Dermatology) Bud Face, MD as Referring Physician (Otolaryngology) Sondra Come, MD as Consulting Physician (Urology)  Indicate any recent Medical Services you may have received from other than [facility] providers in the past year (date may be approximate).     Assessment:   This is a routine wellness examination for Mitsuo.  Hearing/Vision screen Hearing Screening - Comments:: No aids Vision Screening - Comments:: Wears glasses- [facility]  Dietary issues and exercise activities discussed: Current Exercise Habits: Home exercise routine, Type of exercise: walking, Time (Minutes): 20, Frequency (Times/Week): 2, Weekly Exercise (Minutes/Week): 40, Intensity: Mild   Goals Addressed             This Visit's Progress    DIET - EAT MORE FRUITS AND VEGETABLES         Depression Screen    10/26/2022    9:03 AM 08/29/2022    2:44 PM  PHQ 2/9 Scores  PHQ - 2 Score 0 3  PHQ- 9 Score 0 3    Fall Risk    10/26/2022    9:05 AM 10/23/2022    7:36 AM 08/29/2022    2:44 PM  Fall Risk   Falls in the past year? 1 1 0  Number falls in past yr: 0 0 0  Injury with Fall? 1 1 0  Risk for fall due to : History of fall(s)  No Fall Risks  Follow up Falls evaluation completed;Falls prevention discussed  Falls evaluation completed    FALL RISK PREVENTION PERTAINING TO THE HOME:  Any stairs in or around the home? Yes  If so, are there any without handrails? No  Home free of loose throw rugs in walkways, pet beds, electrical cords, etc? Yes  Adequate lighting in your home to reduce risk of falls? Yes   ASSISTIVE DEVICES UTILIZED TO PREVENT FALLS:  Life alert? No  Use of a cane,  walker or w/c? No  Grab bars in the bathroom? No  Shower chair or bench in shower? Yes  Elevated toilet seat or a handicapped toilet? Yes   Cognitive Function:        10/26/2022    9:11 AM  6CIT Screen  What Year? 0 points  What month? 0 points  What time? 0 points  Count back from 20 0 points  Months in reverse 0 points  Repeat phrase 0 points  Total Score 0 points    Immunizations Immunization History  Administered Date(s) Administered   COVID-19, mRNA, vaccine(Comirnaty)12 years and older 02/10/2022   Influenza, Quadrivalent, Recombinant, Inj, Pf 02/11/2022   Influenza,inj,Quad PF,6+ Mos 02/03/2020   Influenza-Unspecified 02/25/2021   PFIZER Comirnaty(Gray Top)Covid-19 Tri-Sucrose Vaccine 02/28/2020, 10/16/2020, 02/25/2021   PFIZER(Purple Top)SARS-COV-2 Vaccination 08/08/2019, 09/02/2019, 02/28/2020   PNEUMOCOCCAL CONJUGATE-20 02/11/2022   Pfizer Covid-19 Vaccine Bivalent Booster 25yrs & up 02/12/2021   Respiratory Syncytial Virus Vaccine,Recomb Aduvanted(Arexvy) 02/11/2022   Tdap 03/17/2016, 02/12/2021    TDAP status: Up to date  Flu Vaccine status: Up to date  Pneumococcal vaccine status: Up to date  Covid-19 vaccine status: Completed vaccines  Qualifies for Shingles Vaccine? Yes   Zostavax completed No   Shingrix Completed?: No.    Education has been provided regarding the importance of this vaccine. Patient has been advised to call insurance company to determine out of pocket expense if they have not yet received this vaccine. Advised may also receive vaccine at local pharmacy or Health Dept. Verbalized acceptance  and understanding.  Screening Tests Health Maintenance  Topic Date Due   HIV Screening  Never done   Hepatitis C Screening  Never done   Zoster Vaccines- Shingrix (1 of 2) Never done   COVID-19 Vaccine (8 - 2023-24 season) 04/07/2022   INFLUENZA VACCINE  12/28/2022   Medicare Annual Wellness (AWV)  10/26/2023   Colonoscopy  03/11/2030    DTaP/Tdap/Td (3 - Td or Tdap) 02/13/2031   Pneumococcal Vaccine 27-53 Years old  Aged Out   HPV VACCINES  Aged Out    Health Maintenance  Health Maintenance Due  Topic Date Due   HIV Screening  Never done   Hepatitis C Screening  Never done   Zoster Vaccines- Shingrix (1 of 2) Never done   COVID-19 Vaccine (8 - 2023-24 season) 04/07/2022    Colorectal cancer screening: Type of screening: Colonoscopy. Completed 03/11/20. Repeat every 10 years  Lung Cancer Screening: (Low Dose CT Chest recommended if Age 13-80 years, 30 pack-year currently smoking OR have quit w/in 15years.) does not qualify.   Additional Screening:  Hepatitis C Screening: does qualify; Completed no  Vision Screening: Recommended annual ophthalmology exams for early detection of glaucoma and other disorders of the eye. Is the patient up to date with their annual eye exam?  Yes  Who is the provider or what is the name of the office in which the patient attends annual eye exams? [facility] If pt is not established with a provider, would they like to be referred to a provider to establish care? No .   Dental Screening: Recommended annual dental exams for proper oral hygiene  Community Resource Referral / Chronic Care Management: CRR required this visit?  No   CCM required this visit?  No      Plan:     I have personally reviewed and noted the following in the patient's chart:   Medical and social history Use of alcohol, tobacco or illicit drugs  Current medications and supplements including opioid prescriptions. Patient is not currently taking opioid prescriptions. Functional ability and status Nutritional status Physical activity Advanced directives List of other physicians Hospitalizations, surgeries, and ER visits in previous 12 months Vitals Screenings to include cognitive, depression, and falls Referrals and appointments  In addition, I have reviewed and discussed with patient certain  preventive protocols, quality metrics, and best practice recommendations. A written personalized care plan for preventive services as well as general preventive health recommendations were provided to patient.     Hal Hope, LPN   [id number]/61/0960   Nurse Notes: none

## 2022-10-26 NOTE — Patient Instructions (Signed)
Mr. Leonetti , Thank you for taking time to come for your Medicare Wellness Visit. I appreciate your ongoing commitment to your health goals. Please review the following plan we discussed and let me know if I can assist you in the future.   These are the goals we discussed:  Goals      DIET - EAT MORE FRUITS AND VEGETABLES        This is a list of the screening recommended for you and due dates:  Health Maintenance  Topic Date Due   HIV Screening  Never done   Hepatitis C Screening  Never done   Zoster (Shingles) Vaccine (1 of 2) Never done   COVID-19 Vaccine (8 - 2023-24 season) 04/07/2022   Flu Shot  12/28/2022   Medicare Annual Wellness Visit  10/26/2023   Colon Cancer Screening  03/11/2030   DTaP/Tdap/Td vaccine (3 - Td or Tdap) 02/13/2031   Pneumococcal Vaccination  Aged Out   HPV Vaccine  Aged Out    Advanced directives: no  Conditions/risks identified: none  Next appointment: Follow up in one year for your annual wellness visit 11/01/23 @ 8:45 am by phone  Preventive Care 40-64 Years, Male Preventive care refers to lifestyle choices and visits with your health care provider that can promote health and wellness. What does preventive care include? A yearly physical exam. This is also called an annual well check. Dental exams once or twice a year. Routine eye exams. Ask your health care provider how often you should have your eyes checked. Personal lifestyle choices, including: Daily care of your teeth and gums. Regular physical activity. Eating a healthy diet. Avoiding tobacco and drug use. Limiting alcohol use. Practicing safe sex. Taking low-dose aspirin every day starting at age 64. What happens during an annual well check? The services and screenings done by your health care provider during your annual well check will depend on your age, overall health, lifestyle risk factors, and family history of disease. Counseling  Your health care provider may ask you  questions about your: Alcohol use. Tobacco use. Drug use. Emotional well-being. Home and relationship well-being. Sexual activity. Eating habits. Work and work Astronomer. Screening  You may have the following tests or measurements: Height, weight, and BMI. Blood pressure. Lipid and cholesterol levels. These may be checked every 5 years, or more frequently if you are over 79 years old. Skin check. Lung cancer screening. You may have this screening every year starting at age 70 if you have a 30-pack-year history of smoking and currently smoke or have quit within the past 15 years. Fecal occult blood test (FOBT) of the stool. You may have this test every year starting at age 31. Flexible sigmoidoscopy or colonoscopy. You may have a sigmoidoscopy every 5 years or a colonoscopy every 10 years starting at age 20. Prostate cancer screening. Recommendations will vary depending on your family history and other risks. Hepatitis C blood test. Hepatitis B blood test. Sexually transmitted disease (STD) testing. Diabetes screening. This is done by checking your blood sugar (glucose) after you have not eaten for a while (fasting). You may have this done every 1-3 years. Discuss your test results, treatment options, and if necessary, the need for more tests with your health care provider. Vaccines  Your health care provider may recommend certain vaccines, such as: Influenza vaccine. This is recommended every year. Tetanus, diphtheria, and acellular pertussis (Tdap, Td) vaccine. You may need a Td booster every 10 years. Zoster vaccine. You may  need this after age 7. Pneumococcal 13-valent conjugate (PCV13) vaccine. You may need this if you have certain conditions and have not been vaccinated. Pneumococcal polysaccharide (PPSV23) vaccine. You may need one or two doses if you smoke cigarettes or if you have certain conditions. Talk to your health care provider about which screenings and vaccines you  need and how often you need them. This information is not intended to replace advice given to you by your health care provider. Make sure you discuss any questions you have with your health care provider. Document Released: 06/11/2015 Document Revised: 02/02/2016 Document Reviewed: 03/16/2015 Elsevier Interactive Patient Education  2017 [employer].  Fall Prevention in the Home Falls can cause injuries. They can happen to people of all ages. There are many things you can do to make your home safe and to help prevent falls. What can I do on the outside of my home? Regularly fix the edges of walkways and driveways and fix any cracks. Remove anything that might make you trip as you walk through a door, such as a raised step or threshold. Trim any bushes or trees on the path to your home. Use bright outdoor lighting. Clear any walking paths of anything that might make someone trip, such as rocks or tools. Regularly check to see if handrails are loose or broken. Make sure that both sides of any steps have handrails. Any raised decks and porches should have guardrails on the edges. Have any leaves, snow, or ice cleared regularly. Use sand or salt on walking paths during winter. Clean up any spills in your garage right away. This includes oil or grease spills. What can I do in the bathroom? Use night lights. Install grab bars by the toilet and in the tub and shower. Do not use towel bars as grab bars. Use non-skid mats or decals in the tub or shower. If you need to sit down in the shower, use a plastic, non-slip stool. Keep the floor dry. Clean up any water that spills on the floor as soon as it happens. Remove soap buildup in the tub or shower regularly. Attach bath mats securely with double-sided non-slip rug tape. Do not have throw rugs and other things on the floor that can make you trip. What can I do in the bedroom? Use night lights. Make sure that you have a light by your bed that is  easy to reach. Do not use any sheets or blankets that are too big for your bed. They should not hang down onto the floor. Have a firm chair that has side arms. You can use this for support while you get dressed. Do not have throw rugs and other things on the floor that can make you trip. What can I do in the kitchen? Clean up any spills right away. Avoid walking on wet floors. Keep items that you use a lot in easy-to-reach places. If you need to reach something above you, use a strong step stool that has a grab bar. Keep electrical cords out of the way. Do not use floor polish or wax that makes floors slippery. If you must use wax, use non-skid floor wax. Do not have throw rugs and other things on the floor that can make you trip. What can I do with my stairs? Do not leave any items on the stairs. Make sure that there are handrails on both sides of the stairs and use them. Fix handrails that are broken or loose. Make sure that handrails  are as long as the stairways. Check any carpeting to make sure that it is firmly attached to the stairs. Fix any carpet that is loose or worn. Avoid having throw rugs at the top or bottom of the stairs. If you do have throw rugs, attach them to the floor with carpet tape. Make sure that you have a light switch at the top of the stairs and the bottom of the stairs. If you do not have them, ask someone to add them for you. What else can I do to help prevent falls? Wear shoes that: Do not have high heels. Have rubber bottoms. Are comfortable and fit you well. Are closed at the toe. Do not wear sandals. If you use a stepladder: Make sure that it is fully opened. Do not climb a closed stepladder. Make sure that both sides of the stepladder are locked into place. Ask someone to hold it for you, if possible. Clearly mark and make sure that you can see: Any grab bars or handrails. First and last steps. Where the edge of each step is. Use tools that help you  move around (mobility aids) if they are needed. These include: Canes. Walkers. Scooters. Crutches. Turn on the lights when you go into a dark area. Replace any light bulbs as soon as they burn out. Set up your furniture so you have a clear path. Avoid moving your furniture around. If any of your floors are uneven, fix them. If there are any pets around you, be aware of where they are. Review your medicines with your doctor. Some medicines can make you feel dizzy. This can increase your chance of falling. Ask your doctor what other things that you can do to help prevent falls. This information is not intended to replace advice given to you by your health care provider. Make sure you discuss any questions you have with your health care provider. Document Released: 03/11/2009 Document Revised: 10/21/2015 Document Reviewed: 06/19/2014 Elsevier Interactive Patient Education  2017 [employer].

## 2022-10-31 ENCOUNTER — Telehealth: Payer: Self-pay

## 2022-10-31 ENCOUNTER — Telehealth: Payer: Self-pay | Admitting: Cardiovascular Disease

## 2022-10-31 NOTE — Telephone Encounter (Signed)
Pt c/o medication issue:  1. Name of Medication:  warfarin (COUMADIN) 5 MG tablet  2. How are you currently taking this medication (dosage and times per day)?   3. Are you having a reaction (difficulty breathing--STAT)?   4. What is your medication issue?   Patient states he is taking antibiotics, Bactrim and Kaflex, and he would like to know if he needs to change his Warfarin dose. Please advise.

## 2022-10-31 NOTE — Telephone Encounter (Signed)
Pt c/o medication issue:  1. Name of Medication:   warfarin (COUMADIN) 5 MG tablet   2. How are you currently taking this medication (dosage and times per day)?   3. Are you having a reaction (difficulty breathing--STAT)?   4. What is your medication issue?    Patient stated he has questions regarding taking his Coumadin medication.

## 2022-10-31 NOTE — Telephone Encounter (Signed)
I spoke to the patient and reviewed Coumadin instructions.  He verbalized understanding

## 2022-10-31 NOTE — Telephone Encounter (Signed)
I spoke to patient who was started on Bactrim and Keflex on 6/3 so I suggested him taking 0.5 tablet of Coumadin today, Thursday, Saturday and Monday.  I will see him 6/12 in the [facility].

## 2022-11-01 ENCOUNTER — Telehealth: Payer: Self-pay | Admitting: Cardiovascular Disease

## 2022-11-01 ENCOUNTER — Telehealth: Payer: Self-pay

## 2022-11-01 NOTE — Telephone Encounter (Signed)
I spoke to the patient and discussed Coumadin schedule.  He verbalized understanding.

## 2022-11-01 NOTE — Telephone Encounter (Signed)
Pt c/o medication issue:  1. Name of Medication: warfarin (COUMADIN) 5 MG tablet   2. How are you currently taking this medication (dosage and times per day)?  TAKE 1 TABLET (5 MG TOTAL) BY MOUTH DAILY.     3. Are you having a reaction (difficulty breathing--STAT)? No  4. What is your medication issue? Pt would like a call back from nurse Casimiro Needle regarding his medication. He needs to know about the dosage since he's decided not to take the antibiotic. Please advise

## 2022-11-02 ENCOUNTER — Other Ambulatory Visit: Payer: Self-pay | Admitting: Cardiovascular Disease

## 2022-11-07 ENCOUNTER — Other Ambulatory Visit: Payer: Self-pay | Admitting: Nephrology

## 2022-11-07 ENCOUNTER — Ambulatory Visit: Payer: Medicare HMO | Admitting: Internal Medicine

## 2022-11-07 DIAGNOSIS — N179 Acute kidney failure, unspecified: Secondary | ICD-10-CM

## 2022-11-13 ENCOUNTER — Ambulatory Visit
Admission: RE | Admit: 2022-11-13 | Discharge: 2022-11-13 | Disposition: A | Discharge status: Home / Self Care | Payer: Medicare HMO | Source: Ambulatory Visit | Attending: Nephrology | Admitting: Nephrology

## 2022-11-13 DIAGNOSIS — N179 Acute kidney failure, unspecified: Secondary | ICD-10-CM | POA: Insufficient documentation

## 2022-11-15 ENCOUNTER — Ambulatory Visit: Payer: Medicare HMO | Attending: Cardiovascular Disease

## 2022-11-15 ENCOUNTER — Ambulatory Visit: Payer: Medicare HMO | Admitting: Nurse Practitioner

## 2022-11-15 ENCOUNTER — Encounter: Payer: Self-pay | Admitting: Nurse Practitioner

## 2022-11-15 DIAGNOSIS — Z5181 Encounter for therapeutic drug level monitoring: Secondary | ICD-10-CM

## 2022-11-15 DIAGNOSIS — I4892 Unspecified atrial flutter: Secondary | ICD-10-CM | POA: Diagnosis not present

## 2022-11-15 LAB — POCT INR: INR: 3.4 — AB (ref 2.0–3.0)

## 2022-11-15 NOTE — Progress Notes (Deleted)
Office Visit    Patient Name: [name] Date of Encounter: 11/15/2022  Primary Care Provider:  Reubin Milan, MD Primary Cardiologist:  Julien Nordmann, MD  Chief Complaint    62 y.o. 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 03/2022 Echo: Asc Ao 40mm.   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 09/02/2014: no sig ischemia, GI uptake noted, no sig WMA, EF 48%, no EKG chanes concerning for ischemia, low risk scan     HTN (hypertension)    Normal coronary arteries    a. cardiac cath 12/03/2014: no significant CAD, right dominant system, LVEF >55%, no MR or AS   Paroxysmal atrial flutter (HCC)    a. s/p successful TEE/DCCV on 08/28/2014-->was on xarelto, now on warfarin.   PSVT (paroxysmal supraventricular tachycardia)    a. 03/2022 Zio: Predominantly sinus rhythm at 64 (37-210).  344 SVT runs (fastest 210 x 6 beat, longest 8 beats), 11.5% PAC burden, 1.7% PVC burden.   Pulmonary fibrosis (HCC)    a. 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 Also on cellcept 500 BID [for connective tissue disorder]- also discussed that this should  also help his hemolytic anemia.      # History of A. Fib- on Xare   Tachycardia-induced cardiomyopathy (HCC)    a. 07/2014 Echo: EF 20-25%, global HK; b. TEE 08/28/2014: EF 30-35%, no intracardic thrombus, mildly dilated LA/RA, mild TR, mild-mod aortic sclerosis w/o stenosis; c. 09/2014 Echo: EF 55-60%, select images w/ inf HK; d. 10/2015 Echo: EF 60-65%; e. 03/2022 Echo: EF 55-60%, no rwma, GrI DD, nl RV fxn, AoV sclerosis. asc Ao 40mm.   Vasculitis (HCC) 04/03/2016   Past Surgical History:  Procedure Laterality Date   CARDIAC CATHETERIZATION N/A 12/03/2014   Procedure: Left Heart Cath and Coronary Angiography;  Surgeon: Antonieta Iba, MD;  Location: [facility];  Service: Cardiovascular;  Laterality: N/A;   COLONOSCOPY WITH PROPOFOL N/A 11/23/2015   Procedure: COLONOSCOPY WITH PROPOFOL;  Surgeon: Midge Minium, MD;  Location: [facility];  Service: Endoscopy;  Laterality: N/A;   COLONOSCOPY WITH PROPOFOL N/A 05/04/2017   Procedure: COLONOSCOPY WITH PROPOFOL;  Surgeon: Midge Minium, MD;  Location: [facility];  Service: Endoscopy;  Laterality: N/A;   ESOPHAGOGASTRODUODENOSCOPY (EGD) WITH PROPOFOL N/A 11/23/2015   Procedure: ESOPHAGOGASTRODUODENOSCOPY (EGD) WITH PROPOFOL;  Surgeon: Midge Minium, MD;  Location: [facility] END[facility];  Service: Endoscopy;  Laterality: N/A;   HAND SURGERY Left    PERIPHERAL VASCULAR CATHETERIZATION N/A 11/17/2015   Procedure: Upper Extremity Angiography;  Surgeon: Iran Ouch, MD;  Location: [facility];  Service:  Cardiovascular;  Laterality: N/A;   POLYPECTOMY  05/04/2017   Procedure: POLYPECTOMY;  Surgeon: Midge Minium, MD;  Location: [facility];  Service: Endoscopy;;    Allergies  Allergies  Allergen Reactions   Allopurinol Diarrhea   Probenecid Other (See Comments) and Nausea Only    Other reaction(s): Headache    History of Present Illness      62 y.o. y/o male with the above past medical history including paroxysmal  atrial flutter, tachycardia induced cardiomyopathy, hypertension, pulmonary sarcoidosis and fibrosis, discoid lupus, rheumatoid arthritis, GERD, anemia.  He was initially found in atrial flutter in September 2016, which time his EF was 20 to 25%.  Ischemic evaluation showed no significant ischemia.  In the setting of chest pain, he underwent diagnostic catheterization in July 2016, which showed no significant coronary disease.  He subsequently went TEE and cardioversion in 2016 and following cardioversion, EF improved to 65 to 65% (June 2017).  In August 2017, he had recurrent atrial flutter with RVR.  He was placed on amiodarone and converted to sinus rhythm.  Amiodarone was subsequently reduced and then discontinued in the setting of pulmonary fibrosis.  He has been chronically anticoagulated with warfarin.  In November 2023, he was noted to have frequent PVCs during office visit.  Subsequent monitoring showed predominantly sinus rhythm with 344 SVT runs, 11.5% PAC burden, and 1.7% PVC burden.  Echocardiogram showed EF of 55 to 60% with grade 1 diastolic dysfunction, aortic sclerosis, and 40 mm ascending aorta.  Beta-blocker therapy was titrated.   Mr. Gohn was last seen in cardiology clinic in January 2024, at which time he reported occasional elevations in heart rates into the 120 range on his watch.  Heart rate/rhythm was stable that day.  Diltiazem was increased to 120 mg daily and he was continued on metoprolol 100 mg twice daily. Home Medications    Current Outpatient Medications  Medication Sig Dispense Refill   albuterol (VENTOLIN HFA) 108 (90 Base) MCG/ACT inhaler Inhale into the lungs as needed.     atorvastatin (LIPITOR) 20 MG tablet Take 1 tablet (20 mg total) by mouth daily. 90 tablet 1   azaTHIOprine (IMURAN) 50 MG tablet Take 50 mg by mouth daily.     colchicine 0.6 MG tablet Take 0.6 mg by mouth daily.     [employer] NASAL ALLERGY SPRAY 55 MCG/ACT AERO nasal inhaler SMARTSIG:2 Spray(s) Both  Nares Daily     diltiazem (CARDIZEM CD) 120 MG 24 hr capsule TAKE 1 CAPSULE TWICE DAILY 180 capsule 1   enalapril (VASOTEC) 20 MG tablet Take 1 tablet (20 mg total) by mouth 2 (two) times daily. 60 tablet 0   febuxostat (ULORIC) 40 MG tablet Take 40 mg by mouth daily.     fluticasone (FLONASE) 50 MCG/ACT nasal spray Place into the nose as needed.     fluticasone-salmeterol (ADVAIR) 250-50 MCG/ACT AEPB Inhale into the lungs daily.     hydrocortisone 2.5 % cream Apply 1 Application topically 2 (two) times daily.     hydroxychloroquine (PLAQUENIL) 200 MG tablet Take 1 tablet by mouth 2 (two) times daily.     ketoconazole (NIZORAL) 2 % shampoo Apply topically 3 (three) times a week.     metoprolol tartrate (LOPRESSOR) 100 MG tablet Take 1 tablet (100 mg total) by mouth 2 (two) times daily. 180 tablet 3   Multiple Vitamin (MULTI-VITAMINS) TABS Take by mouth.     mycophenolate (CELLCEPT) 500 MG tablet Take 2 tablets by mouth 2 (two) times daily.  pantoprazole (PROTONIX) 40 MG tablet Take 40 mg by mouth 2 (two) times daily.     potassium chloride SA (KLOR-CON M) 20 MEQ tablet TAKE 1 TABLET EVERY DAY 90 tablet 3   predniSONE (DELTASONE) 5 MG tablet Take 10 mg by mouth daily with breakfast.     sildenafil (VIAGRA) 50 MG tablet Take 1-2 tablets (50-100 mg total) by mouth daily as needed for erectile dysfunction. 30 tablet 11   tamsulosin (FLOMAX) 0.4 MG CAPS capsule TAKE 1 CAPSULE BY MOUTH EVERY DAY 90 capsule 3   torsemide (DEMADEX) 20 MG tablet TAKE 1/2 TABLET EVERY DAY 45 tablet 2   warfarin (COUMADIN) 5 MG tablet TAKE 1 TABLET (5 MG TOTAL) BY MOUTH DAILY. 90 tablet 1   No current facility-administered medications for this visit.     Review of Systems    ***.  All other systems reviewed and are otherwise negative except as noted above.    Physical Exam    VS:  There were no vitals taken for this visit. , BMI There is no height or weight on file to calculate BMI.     GEN: Well nourished,  well developed, in no acute distress. HEENT: normal. Neck: Supple, no JVD, carotid bruits, or masses. Cardiac: RRR, no murmurs, rubs, or gallops. No clubbing, cyanosis, edema.  Radials 2+/PT 2+ and equal bilaterally.  Respiratory:  Respirations regular and unlabored, clear to auscultation bilaterally. GI: Soft, nontender, nondistended, BS + x 4. MS: no deformity or atrophy. Skin: warm and dry, no rash. Neuro:  Strength and sensation are intact. Psych: Normal affect.  Accessory Clinical Findings    ECG personally reviewed by me today -    *** - no acute changes.  Lab Results  Component Value Date   WBC 9.7 07/11/2022   HGB 14.1 07/11/2022   HCT 45.6 07/11/2022   MCV 86.2 07/11/2022   PLT 152 07/11/2022   Lab Results  Component Value Date   CREATININE 1.12 07/11/2022   BUN 22 07/11/2022   NA 137 07/11/2022   K 3.9 07/11/2022   CL 103 07/11/2022   CO2 25 07/11/2022   Lab Results  Component Value Date   ALT 16 04/18/2021   AST 14 04/18/2021   ALKPHOS 53 04/18/2021   BILITOT 0.3 04/18/2021   Lab Results  Component Value Date   CHOL 173 04/18/2021   HDL 49 04/18/2021   LDLCALC 92 04/18/2021   TRIG 187 (H) 04/18/2021   CHOLHDL 3.5 04/18/2021    Lab Results  Component Value Date   HGBA1C 5.9 03/26/2022    Assessment & Plan    1.  ***   Nicolasa Ducking, NP 11/15/2022, 1:38 PM

## 2022-11-15 NOTE — Patient Instructions (Signed)
HOLD TONIGHT ONLY THEN Continue on same dosage 1 tablet every day EXCEPT 1/2 TABLET ONLY ON MONDAYS.  Recheck in 6 weeks Coumadin Clinic [phone] - call if you are put on any antibiotics or steroids

## 2022-11-16 ENCOUNTER — Encounter: Payer: Self-pay | Admitting: Nurse Practitioner

## 2022-11-17 ENCOUNTER — Other Ambulatory Visit: Payer: Self-pay

## 2022-11-17 DIAGNOSIS — E782 Mixed hyperlipidemia: Secondary | ICD-10-CM

## 2022-11-17 MED ORDER — ATORVASTATIN CALCIUM 20 MG PO TABS
20.0000 mg | ORAL_TABLET | Freq: Every day | ORAL | 0 refills | Status: DC
Start: 2022-11-17 — End: 2022-11-29

## 2022-11-17 MED ORDER — WARFARIN SODIUM 5 MG PO TABS: 5.0000 mg | ORAL_TABLET | Freq: Every day | ORAL | 1 refills | Status: DC

## 2022-11-27 ENCOUNTER — Other Ambulatory Visit: Payer: Self-pay | Admitting: Urology

## 2022-11-27 DIAGNOSIS — N401 Enlarged prostate with lower urinary tract symptoms: Secondary | ICD-10-CM

## 2022-11-28 NOTE — Progress Notes (Unsigned)
Office Visit    Patient Name: [name] Date of Encounter: 11/29/2022  Primary Care Provider:  Reubin Milan, MD Primary Cardiologist:  Julien Nordmann, MD  Chief Complaint  62 year old male with past medical history of atrial flutter on warfarin secondary to cost with tachycardia mediated cardiomyopathy with subsequent improvement in LV systolic function, chronic hypoxic respiratory failure on supplemental oxygen, pulmonary sarcoidosis and fibrosis, discoid lupus, rheumatoid arthritis, hypertension, anemia and GERD.  He presents for follow-up today regarding atrial flutter. Past Medical History    Past Medical History:  Diagnosis Date   Acquired dilation of ascending aorta and aortic root (HCC)    a. 03/2022 Echo: Asc Ao 40mm.   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 09/02/2014: no sig ischemia, GI uptake noted, no sig WMA, EF 48%, no EKG chanes concerning for ischemia, low risk scan     HTN (hypertension)    Normal coronary arteries    a. cardiac cath 12/03/2014: no significant CAD, right dominant system, LVEF >55%, no MR or AS   Paroxysmal atrial flutter (HCC)    a. s/p successful TEE/DCCV on 08/28/2014-->was on xarelto, now on warfarin.   PSVT (paroxysmal supraventricular tachycardia)    a. 03/2022 Zio: Predominantly sinus rhythm at 64 (37-210).  344 SVT runs (fastest 210 x 6 beat, longest 8 beats), 11.5% PAC burden, 1.7% PVC burden.   Pulmonary fibrosis (HCC)    a. 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 Also on cellcept 500 BID [for connective tissue disorder]- also discussed that this should also help his hemolytic anemia.      # History of A. Fib- on Xare   Tachycardia-induced cardiomyopathy (HCC)    a. 07/2014 Echo: EF 20-25%, global HK; b. TEE 08/28/2014: EF 30-35%, no intracardic thrombus, mildly dilated LA/RA, mild TR, mild-mod aortic sclerosis w/o stenosis; c. 09/2014 Echo: EF 55-60%, select images w/ inf HK; d. 10/2015 Echo: EF 60-65%; e. 03/2022 Echo: EF 55-60%, no rwma, GrI DD, nl RV fxn, AoV sclerosis. asc Ao 40mm.   Vasculitis (HCC) 04/03/2016   Past Surgical History:  Procedure Laterality Date   CARDIAC CATHETERIZATION N/A 12/03/2014   Procedure: Left Heart Cath and Coronary Angiography;  Surgeon: Antonieta Iba, MD;  Location: [facility] INVASIVE CV LAB;  Service: Cardiovascular;  Laterality: N/A;   COLONOSCOPY WITH PROPOFOL N/A 11/23/2015   Procedure: COLONOSCOPY WITH PROPOFOL;  Surgeon: Midge Minium, MD;  Location: [facility] ENDOSCOPY;  Service: Endoscopy;  Laterality: N/A;   COLONOSCOPY WITH PROPOFOL N/A 05/04/2017   Procedure: COLONOSCOPY WITH PROPOFOL;  Surgeon: Midge Minium, MD;  Location: [facility];  Service: Endoscopy;  Laterality: N/A;   ESOPHAGOGASTRODUODENOSCOPY (EGD) WITH PROPOFOL N/A 11/23/2015   Procedure: ESOPHAGOGASTRODUODENOSCOPY (EGD) WITH PROPOFOL;  Surgeon: Midge Minium, MD;  Location: [facility] ENDOSCOPY;  Service: Endoscopy;  Laterality: N/A;   HAND SURGERY Left    PERIPHERAL VASCULAR CATHETERIZATION N/A 11/17/2015   Procedure: Upper Extremity Angiography;  Surgeon: Iran Ouch, MD;  Location: [facility] INVASIVE CV LAB;  Service: Cardiovascular;  Laterality: N/A;   POLYPECTOMY  05/04/2017   Procedure: POLYPECTOMY;  Surgeon: Midge Minium, MD;  Location: [facility];  Service: Endoscopy;;   Allergies Allergies  Allergen Reactions   Allopurinol Diarrhea   Probenecid Nausea Only and Other (See Comments)     Other reaction(s): Headache  Other Reaction(s): Other (see comments)   Labs/Other Studies Reviewed    The following studies were reviewed today: Cardiac Studies & Procedures   CARDIAC CATHETERIZATION  CARDIAC CATHETERIZATION 12/03/2014  Narrative Right dominant coronary arterial system No significant coronary artery disease Normal LV gram, ejection fraction estimated at greater than 55% No significant MR, aortic valve stenosis  Etiology of his chest pain is likely atypical in nature Shortness of breath likely secondary to underlying pulmonary fibrosis Medical management recommended  Findings Coronary Findings Diagnostic  Dominance: Right  No diagnostic findings have been documented. Intervention  No interventions have been documented.     ECHOCARDIOGRAM  ECHOCARDIOGRAM COMPLETE 05/02/2022  Narrative ECHOCARDIOGRAM REPORT    Patient Name:   [name] Stalker Date of Exam: 05/02/2022 Medical Rec #:  [id number]               Height:       70.0 in Accession #:    [id number]              Weight:       249.8 lb Date of Birth:  August 26, 1960               BSA:          2.294 m Patient Age:    61 years                BP:           138/78 mmHg Patient Gender: M                       HR:           71 bpm. Exam Location:  [place]  Procedure: 2D Echo, Cardiac Doppler and Color Doppler  Indications:    R00.2 Palpitations; R00.0 Tachycardia; I42.80 Non-ischemic cardiomyopathy  History:        Patient has prior history of Echocardiogram examinations, most recent 10/28/2015. Cardiomyopathy, Arrythmias:Atrial Flutter, Tachycardia and PVC; Risk Factors:Hypertension and Former Smoker.  Sonographer:    Dondra Prader RVT Referring Phys: [id number] Raymon Mutton DUNN  IMPRESSIONS   1. Left ventricular ejection fraction, by estimation, is 55 to 60%. The left ventricle has normal function. The left ventricle has no regional wall motion abnormalities. Left ventricular diastolic parameters  are consistent with Grade I diastolic dysfunction (impaired relaxation). 2. Right ventricule is not well visualized, grossly systolic function appears mildly reduced. The right ventricular size is mildly enlarged. Tricuspid regurgitation signal is inadequate for assessing PA pressure. 3. Left atrial size was mildly dilated. 4. The mitral valve is normal in structure. No evidence of mitral valve regurgitation. No evidence of mitral stenosis. 5. The aortic valve has an indeterminant number of cusps. There is mild calcification of the aortic valve. Aortic valve regurgitation is not visualized. Aortic valve sclerosis/calcification is present, without any evidence of aortic stenosis. Aortic valve area, by VTI measures 2.82 cm. 6. There is mild dilatation of the ascending aorta, measuring 40 mm. 7. The inferior vena cava is normal in size with greater than 50% respiratory variability, suggesting right atrial pressure  of 3 mmHg.  Comparison(s): Echo from 10/2015 showed an EF of 60 to 65%, no regional wall motion abnormalities, normal LV diastolic function parameters, normal RV systolic function and PASP. During the study, frequent PACs and PVCs were noted.  FINDINGS Left Ventricle: Left ventricular ejection fraction, by estimation, is 55 to 60%. The left ventricle has normal function. The left ventricle has no regional wall motion abnormalities. The left ventricular internal cavity size was normal in size. There is no left ventricular hypertrophy. Left ventricular diastolic parameters are consistent with Grade I diastolic dysfunction (impaired relaxation).  Right Ventricle: The right ventricular size is mildly enlarged. No increase in right ventricular wall thickness. Right ventricular systolic function is mildly reduced. Tricuspid regurgitation signal is inadequate for assessing PA pressure.  Left Atrium: Left atrial size was mildly dilated.  Right Atrium: Right atrial size was normal in  size.  Pericardium: There is no evidence of pericardial effusion.  Mitral Valve: The mitral valve is normal in structure. No evidence of mitral valve regurgitation. No evidence of mitral valve stenosis.  Tricuspid Valve: The tricuspid valve is normal in structure. Tricuspid valve regurgitation is not demonstrated. No evidence of tricuspid stenosis.  Aortic Valve: The aortic valve has an indeterminant number of cusps. There is mild calcification of the aortic valve. Aortic valve regurgitation is not visualized. Aortic valve sclerosis/calcification is present, without any evidence of aortic stenosis. Aortic valve mean gradient measures 6.3 mmHg. Aortic valve peak gradient measures 10.2 mmHg. Aortic valve area, by VTI measures 2.82 cm.  Pulmonic Valve: The pulmonic valve was normal in structure. Pulmonic valve regurgitation is not visualized. No evidence of pulmonic stenosis.  Aorta: The aortic root is normal in size and structure. There is mild dilatation of the ascending aorta, measuring 40 mm.  Venous: The inferior vena cava is normal in size with greater than 50% respiratory variability, suggesting right atrial pressure of 3 mmHg.  IAS/Shunts: No atrial level shunt detected by color flow Doppler.   LEFT VENTRICLE PLAX 2D LVIDd:         4.80 cm      Diastology LVIDs:         3.30 cm      LV e' medial:    8.81 cm/s LV PW:         1.40 cm      LV E/e' medial:  10.3 LV IVS:        1.00 cm      LV e' lateral:   10.20 cm/s LVOT diam:     2.30 cm      LV E/e' lateral: 8.9 LV SV:         89 LV SV Index:   39 LVOT Area:     4.15 cm  LV Volumes (MOD) LV vol d, MOD A4C: 138.0 ml LV vol s, MOD A4C: 39.1 ml LV SV MOD A4C:     138.0 ml  RIGHT VENTRICLE             IVC RV S prime:     15.80 cm/s  IVC diam: 1.20 cm TAPSE (M-mode): 2.3 cm  LEFT ATRIUM             Index        RIGHT ATRIUM           Index LA diam:        3.50 cm 1.53 cm/m   RA Area:     20.80 cm LA Vol (A2C):   51.4  ml  22.40 ml/m  RA Volume:   57.60 ml  25.11 ml/m LA Vol (A4C):   61.9 ml 26.96 ml/m LA Biplane Vol: 49.4 ml 21.53 ml/m AORTIC VALVE                     PULMONIC VALVE AV Area (Vmax):    2.52 cm      PV Vmax:       0.73 m/s AV Area (Vmean):   2.73 cm      PV Peak grad:  2.1 mmHg AV Area (VTI):     2.82 cm AV Vmax:           160.00 cm/s AV Vmean:          120.000 cm/s AV VTI:            0.317 m AV Peak Grad:      10.2 mmHg AV Mean Grad:      6.3 mmHg LVOT Vmax:         96.93 cm/s LVOT Vmean:        78.800 cm/s LVOT VTI:          0.215 m LVOT/AV VTI ratio: 0.68  AORTA Ao Root diam: 3.50 cm Ao Asc diam:  4.00 cm  MITRAL VALVE MV Area (PHT): 3.97 cm    SHUNTS MV Decel Time: 191 msec    Systemic VTI:  0.22 m MV E velocity: 91.00 cm/s  Systemic Diam: 2.30 cm MV A velocity: 98.00 cm/s MV E/A ratio:  0.93  Julien Nordmann MD Electronically signed by Julien Nordmann MD Signature Date/Time: 05/02/2022/6:06:51 PM    Final    MONITORS  LONG TERM MONITOR (3-14 DAYS) 05/12/2022  Narrative Event monitor Patch Wear Time:  13 days and 18 hours (2023-11-17T12:44:42-0500 to 2023-12-01T07:29:42-0500)  Normal sinus rhythm Patient had a min HR of 37 bpm, max HR of 210 bpm, and avg HR of 64 bpm.  344 Supraventricular Tachycardia runs occurred, the run with the fastest interval lasting 6 beats with a max rate of 210 bpm, the longest lasting 8 beats with an avg rate of 123 bpm.  Isolated SVEs were frequent (11.5%, 161096), SVE Couplets were occasional (2.6%, 17179), and SVE Triplets were rare (<1.0%, 4282).  Isolated VEs were occasional (1.7%, 22282), VE Couplets were rare (<1.0%, 27), and VE Triplets were rare (<1.0%, 1). Ventricular Bigeminy and Trigeminy were present.  Patient triggered events (2), associated with rare PACs  Signed, Dossie Arbour, MD, Ph.D [facility]          Recent Labs: 07/11/2022: BUN 22; Creatinine, Ser 1.12; Hemoglobin 14.1; Platelets 152; Potassium 3.9;  Sodium 137  Recent Lipid Panel    Component Value Date/Time   CHOL 173 04/18/2021 0908   CHOL 88 08/25/2014 0110   TRIG 187 (H) 04/18/2021 0908   TRIG 124 08/25/2014 0110   HDL 49 04/18/2021 0908   HDL 24 (L) 08/25/2014 0110   CHOLHDL 3.5 04/18/2021 0908   VLDL 25 08/25/2014 0110   LDLCALC 92 04/18/2021 0908   LDLCALC 39 08/25/2014 0110   History of Present Illness  62 year old male with past medical history of atrial flutter on warfarin secondary to cost with tachycardia mediated cardiomyopathy with subsequent improvement in LV systolic function, chronic hypoxic respiratory failure on supplemental oxygen, pulmonary sarcoidosis and fibrosis, discoid lupus, rheumatoid arthritis, hypertension, anemia and GERD.     Initially found to be in atrial flutter in 2016, at which time his EF was 20 to 25%.  Ischemic evaluation at that time was negative.  In 08/2014 he underwent TEE guided DCCV.  TEE at that time showed an EF of 30 to 35% with mild to moderate aortic valve sclerosis without evidence of stenosis.  Patient cardiac monitoring showed sinus rhythm with frequent PVCs and no further evidence of atrial flutter.  Following cardioversion, echo in 09/2014 showed an EF of 55 to 60%.  LHC in 11/2014 showed no significant CAD with an EF greater than 55%.  He was admitted to the hospital on 10/2015 with weakness and fall with a hemoglobin of 5.5 requiring multiple units of PRBC.  Evaluation was unrevealing.  Further evaluation by hematology confirmed hemolytic anemia on prednisone.    On 04/08/2022 he was seen in the ED for near syncope and palpitations.  When waking up that morning he noted his heart rate was elevated around 101 bpm.  His elevated heart rate persisted due to his usual 75 mg of metoprolol.  In this setting he took additional 50 mg metoprolol.  While shopping he developed sudden onset of palpitations with associated near syncope and sweating.  He noted slight increase in underlying shortness of  breath.  No frank chest pain.  No syncope.  EMS was called where he reports having had stable to elevated BP.  Symptoms subsequently resolved in the ED.  Cardiac workup in the ED was unremarkable.  His EKG showed sinus tachycardia at 107 bpm, PACs and a bifascicular block.  Chest x-ray was without acute cardiopulmonary process with findings consistent with interstitial lung disease without significant change when compared to prior studies.   In 03/2022 he wore a Zio monitor.  This indicated normal sinus rhythm with a minimum heart rate of 37 bpm, max heart rate of 210 bpm and average heart rate of 64 bpm.  He had 344 super ventricular tachycardia runs, the run with the fastest interval lasting 6 beats with a max rate of 210 bpm, the longest lasting 8 beats with an average rate of 123 bpm.   His last echocardiogram on 05/02/2022 indicated an LVEF of 55 to 60% with LV normal function and no R WMA.  LV diastolic parameters indicated grade 1 diastolic dysfunction.  There was mild calcification of the aortic valve, no evidence of aortic stenosis.    He was last seen in office by Dr. Mariah Milling on 06/13/2022 at that time was noting increased palpitations.  He was to continue metoprolol tartrate 100 mg twice daily and increased his diltiazem ER 120 mg twice daily for tachyarrhythmia.   On chart review he recently saw nephrology for an AKI on 11/07/22. Patient was noted to have recently had fluid loss from diarrhea and had been taking BC powders. His creatinine had increased to 1.6 and EGFR 48 in April, felt this was due to volume depletion. Per patient renal ultrasound was normal.   Today he reports he has been doing well, has noticed in the last month multiple occurences of increased heart rate up to 120bpm. States he will be doing activity and feel his heart rate increasing, will stop and rest with improvement in heart rate, elevated heart rate is not sustained. Endorses taking an additional dose of metoprolol with  these episodes. Last episode occurred this past weekend. Reports he has not been wearing his oxygen during these episodes. Also notes he started drinking Bang energy drinks, noted increased heart rate following intake. Does report a fall a few weeks ago resulting in a broken finger. He had walked uphill  in his yard, bent over to pick up some litter then became dizzy and with his yard being uneven he fell. He did not pass out or hit his head, denies palpitations at the time.   Home Medications    Current Outpatient Medications  Medication Sig Dispense Refill   albuterol (VENTOLIN HFA) 108 (90 Base) MCG/ACT inhaler Inhale into the lungs as needed.     atorvastatin (LIPITOR) 20 MG tablet Take 1 tablet (20 mg total) by mouth daily. 90 tablet 0   BENLYSTA 200 MG/ML SOAJ Inject 200 mg into the skin once a week.     colchicine 0.6 MG tablet Take 0.6 mg by mouth daily.     [employer] NASAL ALLERGY SPRAY 55 MCG/ACT AERO nasal inhaler SMARTSIG:2 Spray(s) Both Nares Daily     diltiazem (CARDIZEM CD) 120 MG 24 hr capsule TAKE 1 CAPSULE TWICE DAILY 180 capsule 1   enalapril (VASOTEC) 20 MG tablet Take 1 tablet (20 mg total) by mouth 2 (two) times daily. 60 tablet 0   febuxostat (ULORIC) 40 MG tablet Take 40 mg by mouth daily.     fluticasone (FLONASE) 50 MCG/ACT nasal spray Place into the nose as needed.     fluticasone-salmeterol (ADVAIR) 250-50 MCG/ACT AEPB Inhale into the lungs daily.     hydrocortisone 2.5 % cream Apply 1 Application topically 2 (two) times daily.     hydroxychloroquine (PLAQUENIL) 200 MG tablet Take 1 tablet by mouth 2 (two) times daily.     ketoconazole (NIZORAL) 2 % shampoo Apply topically 3 (three) times a week.     metoprolol tartrate (LOPRESSOR) 100 MG tablet Take 1 tablet (100 mg total) by mouth 2 (two) times daily. 180 tablet 3   Multiple Vitamin (MULTI-VITAMINS) TABS Take by mouth.     mycophenolate (CELLCEPT) 500 MG tablet Take 2 tablets by mouth 2 (two) times daily.     pantoprazole  (PROTONIX) 40 MG tablet Take 40 mg by mouth 2 (two) times daily.     potassium chloride SA (KLOR-CON M) 20 MEQ tablet TAKE 1 TABLET EVERY DAY 90 tablet 3   predniSONE (DELTASONE) 5 MG tablet Take 10 mg by mouth daily with breakfast.     sildenafil (VIAGRA) 50 MG tablet Take 1-2 tablets (50-100 mg total) by mouth daily as needed for erectile dysfunction. 30 tablet 11   tamsulosin (FLOMAX) 0.4 MG CAPS capsule TAKE 1 CAPSULE EVERY DAY 90 capsule 3   torsemide (DEMADEX) 20 MG tablet TAKE 1/2 TABLET EVERY DAY 45 tablet 2   warfarin (COUMADIN) 5 MG tablet Take 1 tablet (5 mg total) by mouth daily. 90 tablet 1   azaTHIOprine (IMURAN) 50 MG tablet Take 50 mg by mouth daily.     No current facility-administered medications for this visit.    Review of Systems  He denies chest pain, dyspnea, pnd, orthopnea, n, v, syncope, edema, weight gain, or early satiety. All other systems reviewed and are otherwise negative except as noted above.   Physical Exam    VS:  BP 118/80 (BP Location: Left Arm, Patient Position: Sitting, Cuff Size: Large)   Pulse 63   Ht 5\' 10"  (1.778 m)   Wt 238 lb (108 kg)   SpO2 98%   BMI 34.15 kg/m  , BMI Body mass index is 34.15 kg/m.     GEN: Well nourished, well developed, in no acute distress. HEENT: normal. Neck: Supple, no JVD, carotid bruits, or masses. Cardiac: RRR, no murmurs, rubs, or gallops. No clubbing, cyanosis,  edema.  Radials/DP/PT 2+ and equal bilaterally.  Respiratory:  Respirations regular and unlabored, clear to auscultation bilaterally. GI: Soft, nontender, nondistended, BS + x 4. MS: no deformity or atrophy. Skin: warm and dry, no rash. Neuro:  Strength and sensation are intact. Psych: Normal affect.  Accessory Clinical Findings    ECG personally reviewed by me today - EKG Interpretation Date/Time:  Wednesday November 29 2022 10:08:19 EDT Ventricular Rate:  63 PR Interval:  176 QRS Duration:  106 QT Interval:  418 QTC Calculation: 427 R  Axis:   -47  Text Interpretation: Normal sinus rhythm Left axis deviation Incomplete right bundle branch block Moderate voltage criteria for LVH, may be normal variant ( R in aVL , Cornell product ) No acute changes Confirmed by Reather Littler [phone]) on 11/29/2022 10:18:38 AM    Lab Results  Component Value Date   WBC 9.7 07/11/2022   HGB 14.1 07/11/2022   HCT 45.6 07/11/2022   MCV 86.2 07/11/2022   PLT 152 07/11/2022   Lab Results  Component Value Date   CREATININE 1.12 07/11/2022   BUN 22 07/11/2022   NA 137 07/11/2022   K 3.9 07/11/2022   CL 103 07/11/2022   CO2 25 07/11/2022   Lab Results  Component Value Date   ALT 16 04/18/2021   AST 14 04/18/2021   ALKPHOS 53 04/18/2021   BILITOT 0.3 04/18/2021   Lab Results  Component Value Date   CHOL 173 04/18/2021   HDL 49 04/18/2021   LDLCALC 92 04/18/2021   TRIG 187 (H) 04/18/2021   CHOLHDL 3.5 04/18/2021    Lab Results  Component Value Date   HGBA1C 5.9 03/26/2022   Assessment & Plan   Paroxysmal atrial flutter/Palpitations: Found to be in atrial flutter in 2016, at which time his EF was 20 to 25%. Underwent TEE guided DCCV with return to NSR. Following cardioversion, echo in 09/2014 showed an EF of 55 to 60%.03/2022 Zio monitor indicated normal sinus rhythm with 344 super ventricular tachycardia runs. At [facility] with Dr. Mariah Milling in January his diltiazem ER was increased to 120mg  twice daily. Today reports intermittent elevated heart rate when not wearing his oxygen and following intake of energy drinks. Per patient he is supposed to wear supplemental oxygen at all times. Discussed this could result in elevated heart rate if he is becoming hypoxic with exertion. Encouraged supplemental oxygen use as prescribed. Also discussed decreasing his caffeine intake as this can also result in elevated heart rate. Given episodes of elevated heart rate occurred with increased caffeine intake and when he was not wearing his supplemental oxygen will  defer repeat Zio monitor. He will decrease caffeine and wear oxygen as prescribed, if symptoms reoccur will notify office. Check CBC, Cmet and Lipid panel. Continue cardizem, metoprolol and coumadin.   History of tachy-mediated cardiomyopathy: While in atrial flutter in 2016 his EF decreased to 20 to 25%, underwent DCCV. Last echocardiogram on 05/02/2022 indicated LVEF of 55 to 60%, G1 DD. EKG today indicated sinus rhythm at 63bpm. Patient reporting intermittent tachycardia when not wearing supplemental oxygen and in setting of caffeine intake, see above. Continue diltiazem, enalapril, metoprolol tartrate and torsemide.   Hypertension: Blood pressure well controlled today at 118/80. Continue cardizem, enalapril, metoprolol and torsemide.   Hypercoagulable state: Denies bleeding issues. Followed by coumadin clinic. Continue coumadin.   Chronic hypoxic respiratory failure with pulmonary sarcoidosis and fibrosis: Per patient is supposed to be on supplemental oxygen at all times. States he currently uses only  as needed. Discussed importance of supplemental oxygen, see #1. Followed by pulmonology.   Hyperlipidemia: Over a year since recent lipid panel. Continue atorvastatin 20mg  daily. Check Cmet and fasting lipid panel today.   Disposition: Follow up in 6 months with Dr. Mariah Milling or Eula Listen, PA in 6 months.      Rip Harbour, NP 11/29/2022, 11:19 AM

## 2022-11-29 ENCOUNTER — Telehealth: Payer: Self-pay | Admitting: Emergency Medicine

## 2022-11-29 ENCOUNTER — Other Ambulatory Visit
Admission: RE | Admit: 2022-11-29 | Discharge: 2022-11-29 | Disposition: A | Discharge status: Home / Self Care | Payer: Medicare HMO | Source: Ambulatory Visit | Attending: Cardiology | Admitting: Cardiology

## 2022-11-29 ENCOUNTER — Encounter: Payer: Self-pay | Admitting: Physician Assistant

## 2022-11-29 ENCOUNTER — Ambulatory Visit: Payer: Medicare HMO | Attending: Nurse Practitioner | Admitting: Cardiology

## 2022-11-29 VITALS — BP 118/80 | HR 63 | Ht 70.0 in | Wt 238.0 lb

## 2022-11-29 DIAGNOSIS — I43 Cardiomyopathy in diseases classified elsewhere: Secondary | ICD-10-CM | POA: Diagnosis present

## 2022-11-29 DIAGNOSIS — I10 Essential (primary) hypertension: Secondary | ICD-10-CM | POA: Diagnosis not present

## 2022-11-29 DIAGNOSIS — R000 Tachycardia, unspecified: Secondary | ICD-10-CM | POA: Diagnosis not present

## 2022-11-29 DIAGNOSIS — E782 Mixed hyperlipidemia: Secondary | ICD-10-CM

## 2022-11-29 DIAGNOSIS — J8410 Pulmonary fibrosis, unspecified: Secondary | ICD-10-CM

## 2022-11-29 DIAGNOSIS — E785 Hyperlipidemia, unspecified: Secondary | ICD-10-CM | POA: Diagnosis present

## 2022-11-29 DIAGNOSIS — I428 Other cardiomyopathies: Secondary | ICD-10-CM

## 2022-11-29 DIAGNOSIS — R002 Palpitations: Secondary | ICD-10-CM | POA: Diagnosis not present

## 2022-11-29 DIAGNOSIS — Z79899 Other long term (current) drug therapy: Secondary | ICD-10-CM

## 2022-11-29 DIAGNOSIS — I4892 Unspecified atrial flutter: Secondary | ICD-10-CM

## 2022-11-29 DIAGNOSIS — I493 Ventricular premature depolarization: Secondary | ICD-10-CM

## 2022-11-29 DIAGNOSIS — D6859 Other primary thrombophilia: Secondary | ICD-10-CM

## 2022-11-29 LAB — COMPREHENSIVE METABOLIC PANEL
ALT: 20 U/L (ref 0–44)
AST: 19 U/L (ref 15–41)
Albumin: 4 g/dL (ref 3.5–5.0)
Alkaline Phosphatase: 52 U/L (ref 38–126)
Anion gap: 10 (ref 5–15)
BUN: 19 mg/dL (ref 8–23)
CO2: 25 mmol/L (ref 22–32)
Calcium: 8.9 mg/dL (ref 8.9–10.3)
Chloride: 104 mmol/L (ref 98–111)
Creatinine, Ser: 0.96 mg/dL (ref 0.61–1.24)
GFR, Estimated: >60 mL/min (ref >60)
Glucose, Bld: 104 mg/dL — ABNORMAL HIGH (ref 70–99)
Potassium: 3.3 mmol/L — ABNORMAL LOW (ref 3.5–5.1)
Sodium: 139 mmol/L (ref 135–145)
Total Bilirubin: 0.7 mg/dL (ref 0.3–1.2)
Total Protein: 6.8 g/dL (ref 6.5–8.1)

## 2022-11-29 LAB — LIPID PANEL
Cholesterol: 222 mg/dL — ABNORMAL HIGH (ref 0–200)
HDL: 51 mg/dL (ref >40)
LDL Cholesterol: 139 mg/dL — ABNORMAL HIGH (ref 0–99)
Total CHOL/HDL Ratio: 4.4 RATIO
Triglycerides: 159 mg/dL — ABNORMAL HIGH (ref <150)
VLDL: 32 mg/dL (ref 0–40)

## 2022-11-29 LAB — CBC
HCT: 43.4 % (ref 39.0–52.0)
Hemoglobin: 13.5 g/dL (ref 13.0–17.0)
MCH: 28 pg (ref 26.0–34.0)
MCHC: 31.1 g/dL (ref 30.0–36.0)
MCV: 89.9 fL (ref 80.0–100.0)
Platelets: 176 10*3/uL (ref 150–400)
RBC: 4.83 MIL/uL (ref 4.22–5.81)
RDW: 14.8 % (ref 11.5–15.5)
WBC: 9.8 10*3/uL (ref 4.0–10.5)
nRBC: 0 % (ref 0.0–0.2)

## 2022-11-29 MED ORDER — ATORVASTATIN CALCIUM 40 MG PO TABS
20.0000 mg | ORAL_TABLET | Freq: Every day | ORAL | 3 refills | Status: DC
Start: 2022-11-29 — End: 2024-02-11

## 2022-11-29 NOTE — Telephone Encounter (Signed)
Called and spoke with patient. Informed patient of the following recommendations from [facility], NP.  CBC with no evidence of anemia nor infection.  Your cholesterol, triglycerides and LDL (bad cholesterol) are elevated. Lets increase atorvastatin to 40mg  daily. Recheck fasting lipid panel and LFTs in 2-3 months. Potassium level is low, please confirm he is taking his potassium supplement, if not please restart. If he has been taking potassium daily, increase to daily for two days then restart daily after. Recheck Bmet in 1-2 weeks.  Patient states that he is taking 20 Meq potassium daily and that he will increase to 30 Meq for two days. Patient requested that lab instructions be sent to Mychart. Prescription for atorvastatin sent to preferred pharmacy. Patient verbalizes understanding of all recommendations.

## 2022-11-29 NOTE — Patient Instructions (Signed)
Medication Instructions:  - Your physician recommends that you continue on your current medications as directed. Please refer to the Current Medication list given to you today.  *If you need a refill on your cardiac medications before your next appointment, please call your pharmacy*   Lab Work: - Your physician recommends that you have lab work today:   Lipid/ CBC/ CMET  Sales executive at [facility] 1st desk on the right to check in (REGISTRATION)  Lab hours: Monday- Friday (7:30 am- 5:30 pm)   If you have labs (blood work) drawn today and your tests are completely normal, you will receive your results only by: MyChart Message (if you have MyChart) OR A paper copy in the mail If you have any lab test that is abnormal or we need to change your treatment, we will call you to review the results.   Testing/Procedures: - none ordered   Follow-Up: At [facility], you and your health needs are our priority.  As part of our continuing mission to provide you with exceptional heart care, we have created designated Provider Care Teams.  These Care Teams include your primary Cardiologist (physician) and Advanced Practice Providers (APPs -  Physician Assistants and Nurse Practitioners) who all work together to provide you with the care you need, when you need it.  We recommend signing up for the patient portal called "MyChart".  Sign up information is provided on this After Visit Summary.  MyChart is used to connect with patients for Virtual Visits (Telemedicine).  Patients are able to view lab/test results, encounter notes, upcoming appointments, etc.  Non-urgent messages can be sent to your provider as well.   To learn more about what you can do with MyChart, go to [email or web address].    Your next appointment:   6 month(s)  Provider:   You may see Julien Nordmann, MD or one of the following Advanced Practice Providers on your designated Care Team:    Eula Listen,  PA-C   Other Instructions N/a

## 2022-12-19 ENCOUNTER — Ambulatory Visit: Payer: Medicare HMO | Admitting: Urology

## 2022-12-19 ENCOUNTER — Encounter: Payer: Self-pay | Admitting: Urology

## 2022-12-19 VITALS — BP 135/85 | HR 90 | Ht 70.0 in | Wt 239.0 lb

## 2022-12-19 DIAGNOSIS — N529 Male erectile dysfunction, unspecified: Secondary | ICD-10-CM

## 2022-12-19 DIAGNOSIS — N401 Enlarged prostate with lower urinary tract symptoms: Secondary | ICD-10-CM | POA: Diagnosis not present

## 2022-12-19 DIAGNOSIS — Z125 Encounter for screening for malignant neoplasm of prostate: Secondary | ICD-10-CM | POA: Diagnosis not present

## 2022-12-19 LAB — BLADDER SCAN AMB NON-IMAGING

## 2022-12-19 MED ORDER — TAMSULOSIN HCL 0.4 MG PO CAPS: 0.4000 mg | ORAL_CAPSULE | Freq: Every day | ORAL | 3 refills | Status: DC

## 2022-12-19 MED ORDER — SILDENAFIL CITRATE 50 MG PO TABS: 50.0000 mg | ORAL_TABLET | Freq: Every day | ORAL | 11 refills | Status: AC | PRN

## 2022-12-19 NOTE — Patient Instructions (Signed)

## 2022-12-19 NOTE — Progress Notes (Signed)
   12/19/2022 8:21 AM   [name] 05/18/1961 [id number]  Reason for visit: Follow up urinary symptoms, ED, prostate nodule, PSA screening, low testosterone  HPI: 62 year old male with a number of comorbidities including obesity with BMI of 34, A-fib on Coumadin, diuretic use, and history of a prostate nodule with a normal prostate MRI and normal PSA.  He previously was scheduled for cystoscopy by Michiel Cowboy, PA, but he ultimately deferred.  Prostate MRI showed a 44 g prostate with no suspicious lesions and a normal-appearing bladder.  We reviewed the risks and benefits of PSA screening, and he would like to continue screening.  Will be due for PSA next year 11/2023.  In terms of his urinary symptoms, his primary complaints have been urgency and frequency when he takes his torsemide.  He has had improvement on Flomax, PVR today normal at 30ml.  Denies any major changes in urination over the last year, continues to have some frequency that is diuretic and fluid intake related, nocturia 1-2X overnight.  He continues on sildenafil 50-100mg  on demand for ED with good results.  He also has a history of low testosterone in the past, including 255 in January 2022, and 213 in December 2021.  We discussed options including testosterone replacement, Clomid, or behavioral strategies.  He would like to start with behavioral strategies including exercise, weight lifting, weight loss.  Consider repeat testosterone in the future  Continue Flomax and Viagra, refilled PSA reflex to free next year RTC 1 year PVR and symptom check, sooner if problems  Sondra Come, MD  [facility] [address], [address] [place], [place] [address] [phone]

## 2022-12-27 ENCOUNTER — Ambulatory Visit: Payer: Medicare HMO | Attending: Cardiovascular Disease

## 2022-12-27 DIAGNOSIS — I4892 Unspecified atrial flutter: Secondary | ICD-10-CM

## 2022-12-27 DIAGNOSIS — Z5181 Encounter for therapeutic drug level monitoring: Secondary | ICD-10-CM | POA: Diagnosis not present

## 2022-12-27 LAB — POCT INR: INR: 2.2 (ref 2.0–3.0)

## 2022-12-27 NOTE — Patient Instructions (Signed)
Continue on same dosage 1 tablet every day EXCEPT 1/2 TABLET ONLY ON MONDAYS.  Recheck in 6 weeks [facility] [phone] - call if you are put on any antibiotics or steroids

## 2022-12-29 ENCOUNTER — Encounter: Payer: Medicare HMO | Admitting: Internal Medicine

## 2023-01-09 ENCOUNTER — Other Ambulatory Visit: Payer: Medicare HMO

## 2023-01-09 ENCOUNTER — Inpatient Hospital Stay: Payer: Medicare HMO | Admitting: Nurse Practitioner

## 2023-01-09 ENCOUNTER — Encounter: Payer: Self-pay | Admitting: Nurse Practitioner

## 2023-01-09 ENCOUNTER — Ambulatory Visit: Payer: Medicare HMO | Admitting: Internal Medicine

## 2023-01-09 ENCOUNTER — Inpatient Hospital Stay: Payer: Medicare HMO | Attending: Internal Medicine

## 2023-01-09 VITALS — BP 150/89 | HR 66 | Temp 98.4°F | Wt 236.0 lb

## 2023-01-09 DIAGNOSIS — Z7952 Long term (current) use of systemic steroids: Secondary | ICD-10-CM | POA: Diagnosis not present

## 2023-01-09 DIAGNOSIS — D594 Other nonautoimmune hemolytic anemias: Secondary | ICD-10-CM

## 2023-01-09 DIAGNOSIS — K219 Gastro-esophageal reflux disease without esophagitis: Secondary | ICD-10-CM | POA: Diagnosis not present

## 2023-01-09 DIAGNOSIS — D869 Sarcoidosis, unspecified: Secondary | ICD-10-CM | POA: Diagnosis not present

## 2023-01-09 DIAGNOSIS — I10 Essential (primary) hypertension: Secondary | ICD-10-CM | POA: Diagnosis not present

## 2023-01-09 DIAGNOSIS — Z87891 Personal history of nicotine dependence: Secondary | ICD-10-CM | POA: Diagnosis not present

## 2023-01-09 DIAGNOSIS — Z7901 Long term (current) use of anticoagulants: Secondary | ICD-10-CM | POA: Diagnosis not present

## 2023-01-09 DIAGNOSIS — I4891 Unspecified atrial fibrillation: Secondary | ICD-10-CM | POA: Diagnosis not present

## 2023-01-09 DIAGNOSIS — Z79624 Long term (current) use of inhibitors of nucleotide synthesis: Secondary | ICD-10-CM | POA: Diagnosis not present

## 2023-01-09 DIAGNOSIS — D591 Autoimmune hemolytic anemia, unspecified: Secondary | ICD-10-CM

## 2023-01-09 DIAGNOSIS — M109 Gout, unspecified: Secondary | ICD-10-CM | POA: Insufficient documentation

## 2023-01-09 DIAGNOSIS — Z79899 Other long term (current) drug therapy: Secondary | ICD-10-CM | POA: Insufficient documentation

## 2023-01-09 DIAGNOSIS — J8410 Pulmonary fibrosis, unspecified: Secondary | ICD-10-CM | POA: Insufficient documentation

## 2023-01-09 DIAGNOSIS — M069 Rheumatoid arthritis, unspecified: Secondary | ICD-10-CM | POA: Diagnosis not present

## 2023-01-09 DIAGNOSIS — J9610 Chronic respiratory failure, unspecified whether with hypoxia or hypercapnia: Secondary | ICD-10-CM | POA: Insufficient documentation

## 2023-01-09 LAB — CBC WITH DIFFERENTIAL (CANCER CENTER ONLY)
Abs Immature Granulocytes: 0.09 10*3/uL — ABNORMAL HIGH (ref 0.00–0.07)
Basophils Absolute: 0.1 10*3/uL (ref 0.0–0.1)
Basophils Relative: 1 %
Eosinophils Absolute: 0.2 10*3/uL (ref 0.0–0.5)
Eosinophils Relative: 2 %
HCT: 44.6 % (ref 39.0–52.0)
Hemoglobin: 13.8 g/dL (ref 13.0–17.0)
Immature Granulocytes: 1 %
Lymphocytes Relative: 11 %
Lymphs Abs: 1.2 10*3/uL (ref 0.7–4.0)
MCH: 28 pg (ref 26.0–34.0)
MCHC: 30.9 g/dL (ref 30.0–36.0)
MCV: 90.7 fL (ref 80.0–100.0)
Monocytes Absolute: 1 10*3/uL (ref 0.1–1.0)
Monocytes Relative: 9 %
Neutro Abs: 8.3 10*3/uL — ABNORMAL HIGH (ref 1.7–7.7)
Neutrophils Relative %: 76 %
Platelet Count: 173 10*3/uL (ref 150–400)
RBC: 4.92 MIL/uL (ref 4.22–5.81)
RDW: 13.7 % (ref 11.5–15.5)
WBC Count: 10.8 10*3/uL — ABNORMAL HIGH (ref 4.0–10.5)
nRBC: 0 % (ref 0.0–0.2)

## 2023-01-09 LAB — BASIC METABOLIC PANEL - CANCER CENTER ONLY
Anion gap: 8 (ref 5–15)
BUN: 25 mg/dL — ABNORMAL HIGH (ref 8–23)
CO2: 26 mmol/L (ref 22–32)
Calcium: 9.1 mg/dL (ref 8.9–10.3)
Chloride: 104 mmol/L (ref 98–111)
Creatinine: 1.21 mg/dL (ref 0.61–1.24)
GFR, Estimated: >60 mL/min (ref >60)
Glucose, Bld: 92 mg/dL (ref 70–99)
Potassium: 3.5 mmol/L (ref 3.5–5.1)
Sodium: 138 mmol/L (ref 135–145)

## 2023-01-09 LAB — IRON AND TIBC
Iron: 97 ug/dL (ref 45–182)
Saturation Ratios: 31 % (ref 17.9–39.5)
TIBC: 312 ug/dL (ref 250–450)
UIBC: 215 ug/dL

## 2023-01-09 LAB — FERRITIN: Ferritin: 29 ng/mL (ref 24–336)

## 2023-01-09 LAB — LACTATE DEHYDROGENASE: LDH: 137 U/L (ref 98–192)

## 2023-01-09 NOTE — Progress Notes (Signed)
[facility] OFFICE PROGRESS NOTE  Patient Care Team: Reubin Milan, MD as PCP - General (Internal Medicine) Mariah Milling Tollie Pizza, MD as PCP - Cardiology (Cardiology) Mariah Milling Tollie Pizza, MD as Consulting Physician (Cardiology) Earna Coder, MD as Consulting Physician (Oncology) Patterson Hammersmith, MD as Consulting Physician (Rheumatology) Mertie Moores, MD as Referring Physician (Pulmonary Disease) Jesusita Oka, MD as Referring Physician (Dermatology) Bud Face, MD as Referring Physician (Otolaryngology) Sondra Come, MD as Consulting Physician (Urology)   Oncology History   No history exists.   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  Ambulating independently.  [name] 62 y.o. male pleasant patient with above history of autoimmune hemolytic anemia, currently on prednisone 10 mg daily, cellcept 500 twice a day, who returns to clinic for follow up. He feels at baseline and denies complaints. No worsening shortness of breath. No abnormal bleeding or bruising.   Review of Systems  Constitutional:  Negative for chills, diaphoresis, fever, malaise/fatigue and weight loss.  HENT:  Negative for nosebleeds and sore throat.   Eyes:  Negative for double vision.  Respiratory:  Negative for cough, hemoptysis, sputum production, shortness of breath and wheezing.   Cardiovascular:  Negative for chest pain, palpitations, orthopnea and leg swelling.  Gastrointestinal:  Negative for abdominal pain, blood in stool, constipation, diarrhea, heartburn, melena, nausea and vomiting.  Genitourinary:   Negative for dysuria, frequency and urgency.  Musculoskeletal:  Positive for back pain and joint pain.  Skin: Negative.  Negative for itching and rash.  Neurological:  Negative for dizziness, tingling, focal weakness, weakness and headaches.  Endo/Heme/Allergies:  Does not bruise/bleed easily.  Psychiatric/Behavioral:  Negative for depression. The patient is not nervous/anxious and does not have insomnia.    PAST MEDICAL HISTORY :  Past Medical History:  Diagnosis Date   Acquired dilation of ascending aorta and aortic root (HCC)    a. 03/2022 Echo: Asc Ao 40mm.   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 09/02/2014: no sig ischemia, GI uptake noted, no sig WMA, EF 48%, no EKG chanes concerning for ischemia, low risk scan     HTN (hypertension)    Normal coronary arteries    a. cardiac cath 12/03/2014: no significant CAD, right dominant system, LVEF >55%, no MR or AS   Paroxysmal atrial flutter (HCC)    a. s/p successful TEE/DCCV on 08/28/2014-->was on xarelto, now on warfarin.   PSVT (paroxysmal supraventricular tachycardia)    a. 03/2022 Zio: Predominantly sinus rhythm at 64 (37-210).  344 SVT runs (fastest 210 x 6 beat, longest 8 beats), 11.5% PAC burden, 1.7% PVC burden.   Pulmonary fibrosis (HCC)    a. 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 Also on cellcept 500 BID [for connective tissue disorder]- also discussed that this should also help his hemolytic anemia.      # History of A.  Fib- on Xare   Tachycardia-induced cardiomyopathy (HCC)    a. 07/2014 Echo: EF 20-25%, global HK; b. TEE 08/28/2014: EF 30-35%, no intracardic thrombus, mildly dilated LA/RA, mild TR, mild-mod aortic sclerosis w/o stenosis; c. 09/2014 Echo: EF 55-60%, select images w/ inf HK; d. 10/2015 Echo: EF 60-65%; e. 03/2022 Echo: EF 55-60%, no rwma, GrI DD, nl RV fxn, AoV sclerosis. asc Ao 40mm.   Vasculitis (HCC) 04/03/2016    PAST SURGICAL HISTORY :   Past Surgical History:  Procedure Laterality Date   CARDIAC CATHETERIZATION N/A 12/03/2014   Procedure: Left Heart Cath and Coronary Angiography;  Surgeon: Antonieta Iba, MD;  Location: [facility] INVASIVE CV LAB;  Service: Cardiovascular;  Laterality: N/A;   COLONOSCOPY WITH PROPOFOL N/A 11/23/2015   Procedure: COLONOSCOPY WITH PROPOFOL;  Surgeon: Midge Minium, MD;  Location: [facility] ENDOSCOPY;  Service: Endoscopy;  Laterality: N/A;   COLONOSCOPY WITH PROPOFOL N/A 05/04/2017   Procedure: COLONOSCOPY WITH PROPOFOL;  Surgeon: Midge Minium, MD;  Location: [facility];  Service: Endoscopy;  Laterality: N/A;   ESOPHAGOGASTRODUODENOSCOPY (EGD) WITH PROPOFOL N/A 11/23/2015   Procedure: ESOPHAGOGASTRODUODENOSCOPY (EGD) WITH PROPOFOL;  Surgeon: Midge Minium, MD;  Location: [facility] ENDOSCOPY;  Service: Endoscopy;  Laterality: N/A;   HAND SURGERY Left    PERIPHERAL VASCULAR CATHETERIZATION N/A 11/17/2015   Procedure: Upper Extremity Angiography;  Surgeon: Iran Ouch, MD;  Location: [facility] INVASIVE CV LAB;  Service: Cardiovascular;  Laterality: N/A;   POLYPECTOMY  05/04/2017   Procedure: POLYPECTOMY;  Surgeon: Midge Minium, MD;  Location: [facility];  Service: Endoscopy;;    FAMILY HISTORY :   Family History  Problem Relation Age of Onset   Leukemia Mother     SOCIAL HISTORY:   Social History   Tobacco Use   Smoking status: Former    Current packs/day: 0.00    Average packs/day: 1 pack/day for 20.0 years (20.0 ttl pk-yrs)    Types: Cigarettes     Start date: 08/21/1978    Quit date: 08/21/1998    Years since quitting: 24.4    Passive exposure: Past   Smokeless tobacco: Never  Vaping Use   Vaping status: Never Used  Substance Use Topics   Alcohol use: No   Drug use: No    ALLERGIES:  is allergic to allopurinol and probenecid.  MEDICATIONS:  Current Outpatient Medications  Medication Sig Dispense Refill   albuterol (VENTOLIN HFA) 108 (90 Base) MCG/ACT inhaler Inhale into the lungs as needed.     atorvastatin (LIPITOR) 40 MG tablet Take 0.5 tablets (20 mg total) by mouth daily. 90 tablet 3   azaTHIOprine (IMURAN) 50 MG tablet Take 50 mg by mouth daily.     BENLYSTA 200 MG/ML SOAJ Inject 200 mg into the skin once a week.     colchicine 0.6 MG tablet Take 0.6 mg by mouth daily.     [employer] NASAL ALLERGY SPRAY 55 MCG/ACT AERO nasal inhaler SMARTSIG:2 Spray(s) Both Nares Daily     diltiazem (CARDIZEM CD) 120 MG 24 hr capsule TAKE 1 CAPSULE TWICE DAILY 180 capsule 1   enalapril (VASOTEC) 20 MG tablet Take 1 tablet (20 mg total) by mouth 2 (two) times daily. 60 tablet 0   febuxostat (ULORIC) 40 MG tablet Take 40 mg by mouth daily.  fluticasone (FLONASE) 50 MCG/ACT nasal spray Place into the nose as needed.     fluticasone-salmeterol (ADVAIR) 250-50 MCG/ACT AEPB Inhale into the lungs daily.     hydrocortisone 2.5 % cream Apply 1 Application topically 2 (two) times daily.     hydroxychloroquine (PLAQUENIL) 200 MG tablet Take 1 tablet by mouth 2 (two) times daily.     ketoconazole (NIZORAL) 2 % shampoo Apply topically 3 (three) times a week.     metoprolol tartrate (LOPRESSOR) 100 MG tablet Take 1 tablet (100 mg total) by mouth 2 (two) times daily. 180 tablet 3   Multiple Vitamin (MULTI-VITAMINS) TABS Take by mouth.     mycophenolate (CELLCEPT) 500 MG tablet Take 2 tablets by mouth 2 (two) times daily.     pantoprazole (PROTONIX) 40 MG tablet Take 40 mg by mouth 2 (two) times daily.     potassium chloride SA (KLOR-CON M) 20 MEQ tablet  TAKE 1 TABLET EVERY DAY 90 tablet 3   predniSONE (DELTASONE) 5 MG tablet Take 10 mg by mouth daily with breakfast.     sildenafil (VIAGRA) 50 MG tablet Take 1-2 tablets (50-100 mg total) by mouth daily as needed for erectile dysfunction. 30 tablet 11   tamsulosin (FLOMAX) 0.4 MG CAPS capsule Take 1 capsule (0.4 mg total) by mouth daily. 90 capsule 3   torsemide (DEMADEX) 20 MG tablet TAKE 1/2 TABLET EVERY DAY 45 tablet 2   warfarin (COUMADIN) 5 MG tablet Take 1 tablet (5 mg total) by mouth daily. 90 tablet 1   No current facility-administered medications for this visit.    PHYSICAL EXAMINATION: BP (!) 150/89 (BP Location: Left Arm, Patient Position: Sitting)   Pulse 66   Temp 98.4 F (36.9 C) (Tympanic)   Wt 236 lb (107 kg)   SpO2 100%   BMI 33.86 kg/m   Filed Weights   01/09/23 1015  Weight: 236 lb (107 kg)   Physical Exam Vitals reviewed.  HENT:     Head: Normocephalic and atraumatic.  Cardiovascular:     Rate and Rhythm: Normal rate and regular rhythm.  Pulmonary:     Effort: Pulmonary effort is normal. No respiratory distress.  Abdominal:     General: There is no distension.     Palpations: Abdomen is soft.     Tenderness: There is no abdominal tenderness. There is no guarding.  Musculoskeletal:        General: No deformity.  Skin:    General: Skin is warm.  Neurological:     Mental Status: He is alert and oriented to person, place, and time.  Psychiatric:        Mood and Affect: Mood and affect normal.        Behavior: Behavior normal.     LABORATORY DATA:  I have reviewed the data as listed    Component Value Date/Time   NA 138 01/09/2023 0948   NA 143 04/07/2021 0939   NA 137 08/27/2014 0403   K 3.5 01/09/2023 0948   K 4.1 08/27/2014 0403   CL 104 01/09/2023 0948   CL 108 08/27/2014 0403   CO2 26 01/09/2023 0948   CO2 26 08/27/2014 0403   GLUCOSE 92 01/09/2023 0948   GLUCOSE 94 08/27/2014 0403   BUN 25 (H) 01/09/2023 0948   BUN 22 04/07/2021 0939    BUN 16 08/27/2014 0403   CREATININE 1.21 01/09/2023 0948   CREATININE 0.85 08/27/2014 0403   CALCIUM 9.1 01/09/2023 0948   CALCIUM 8.6 (L) 08/27/2014  0403   PROT 6.8 11/29/2022 1137   PROT 6.6 04/18/2021 0908   ALBUMIN 4.0 11/29/2022 1137   ALBUMIN 4.1 04/18/2021 0908   AST 19 11/29/2022 1137   ALT 20 11/29/2022 1137   ALKPHOS 52 11/29/2022 1137   BILITOT 0.7 11/29/2022 1137   BILITOT 0.3 04/18/2021 0908   GFRNONAA >60 01/09/2023 0948   GFRNONAA >60 08/27/2014 0403   GFRAA >60 11/25/2019 0958   GFRAA >60 08/27/2014 0403   Lab Results  Component Value Date   WBC 10.8 (H) 01/09/2023   NEUTROABS 8.3 (H) 01/09/2023   HGB 13.8 01/09/2023   HCT 44.6 01/09/2023   MCV 90.7 01/09/2023   PLT 173 01/09/2023   Iron/TIBC/Ferritin/ %Sat    Component Value Date/Time   IRON 97 01/09/2023 0948   TIBC 312 01/09/2023 0948   FERRITIN 29 01/09/2023 0948   IRONPCTSAT 31 01/09/2023 0948     RADIOGRAPHIC STUDIES: I have personally reviewed the radiological images as listed and agreed with the findings in the report. No results found.   ASSESSMENT & PLAN:  No problem-specific Assessment & Plan notes found for this encounter.  # Autoimmune hemolytic anemia - secondary to connective tissue disorder/autoimmune disorder- currently on prednisone 10 mg/day; cellcept 500 BID  [rheumatology issues].   # Hemoglobin today 13.8. LDH normal. No evidence of hemolysis. Ferritin 29, Iron sat 31%. Continue gentle iron.    # GFR > 60. Stable.    # Chronic respiratory failure- pulmonary fibrosis/sarcoidosis [Dr.Fleming]- on Home O2 - Not on O2 in clinic today.    # A. fib on Coumadin- Managed by cardiology. stable   DISPOSITION: 6 mo- lab (cbc, bmp, ldh, ferritin, iron studies), Dr Donneta Romberg- [place]  No orders of the defined types were placed in this encounter.  All questions were answered. The patient knows to call the clinic with any problems, questions or concerns.      Alinda Dooms,  NP 01/09/2023

## 2023-02-07 ENCOUNTER — Ambulatory Visit: Payer: Medicare HMO | Attending: Cardiovascular Disease

## 2023-02-07 DIAGNOSIS — I4892 Unspecified atrial flutter: Secondary | ICD-10-CM | POA: Diagnosis not present

## 2023-02-07 DIAGNOSIS — Z5181 Encounter for therapeutic drug level monitoring: Secondary | ICD-10-CM

## 2023-02-07 LAB — POCT INR: INR: 4 — AB (ref 2.0–3.0)

## 2023-02-07 NOTE — Patient Instructions (Signed)
Hold today only then Continue on same dosage 1 tablet every day EXCEPT 1/2 TABLET ONLY ON MONDAYS.  Recheck in 4 weeks Coumadin Clinic [phone] - call if you are put on any antibiotics or steroids

## 2023-02-09 ENCOUNTER — Ambulatory Visit: Payer: Self-pay | Admitting: *Deleted

## 2023-02-09 NOTE — Telephone Encounter (Signed)
Chief Complaint: cough congestion requesting medication  Symptoms: nasal congestion , cough productive green mucus when wakes up and clears throughout the day. Recent return from [place] . Wife has similar sx and received antibiotics. Did not test for covid  Frequency: since Sunday 02/04/23 Pertinent Negatives: Patient denies chest pain no difficulty breathing no fever  Disposition: [] ED /[] Urgent Care (no appt availability in office) / [x] Appointment(In office/virtual)/ []  [place] [facility]/ [] Home Care/ [] Refused Recommended Disposition /[] [place] [facility]/ []  Follow-up with PCP Additional Notes:   Recommended to do at home covid test. Recommended UC VV patient requesting to speak with PCP instead. My chart VV scheduled for Monday 02/12/23.    Summary: Congestion   The patient called in stating he is congested and that is the only symptom he is reporting. He states he and his wife recently came back from [place] and saw a lot of family while there. He says his wife is also sick and she had a video chat with her provider as well and was prescribed an antibiotic. He is wondering if at this point that maybe what is best for him as well. Please assist patient further                Reason for Disposition  Cough has been present for > 3 weeks    Less than 3 weeks , only 5 days  Answer Assessment - Initial Assessment Questions 1. ONSET: "When did the cough begin?"      Sunday  2. SEVERITY: "How bad is the cough today?"      Wakes up "bad at times" 3. SPUTUM: "Describe the color of your sputum" (none, dry cough; clear, white, yellow, green)     Green in am and clears throughout day  4. HEMOPTYSIS: "Are you coughing up any blood?" If so ask: "How much?" (flecks, streaks, tablespoons, etc.)     na 5. DIFFICULTY BREATHING: "Are you having difficulty breathing?" If Yes, ask: "How bad is it?" (e.g., mild, moderate, severe)    - MILD: No SOB at rest, mild SOB with walking,  speaks normally in sentences, can lie down, no retractions, pulse < 100.    - MODERATE: SOB at rest, SOB with minimal exertion and prefers to sit, cannot lie down flat, speaks in phrases, mild retractions, audible wheezing, pulse 100-120.    - SEVERE: Very SOB at rest, speaks in single words, struggling to breathe, sitting hunched forward, retractions, pulse > 120      No  6. FEVER: "Do you have a fever?" If Yes, ask: "What is your temperature, how was it measured, and when did it start?"     na 7. CARDIAC HISTORY: "Do you have any history of heart disease?" (e.g., heart attack, congestive heart failure)      na 8. LUNG HISTORY: "Do you have any history of lung disease?"  (e.g., pulmonary embolus, asthma, emphysema)     na 9. PE RISK FACTORS: "Do you have a history of blood clots?" (or: recent major surgery, recent prolonged travel, bedridden)     na 10. OTHER SYMPTOMS: "Do you have any other symptoms?" (e.g., runny nose, wheezing, chest pain)       Nasal congestion, cough productive green sputum at times.  11. PREGNANCY: "Is there any chance you are pregnant?" "When was your last menstrual period?"       na 12. TRAVEL: "Have you traveled out of the country in the last month?" (e.g., travel history, exposures) No but  did just come back from [place]  Protocols used: Cough - Acute Productive-A-AH

## 2023-02-09 NOTE — Telephone Encounter (Signed)
This encounter was created in error - please disregard.

## 2023-02-09 NOTE — Telephone Encounter (Signed)
Chief Complaint: COVID positive Symptoms: SOB w/activity, diarrhea, cough, fatigue Frequency: Onset last Friday Pertinent Negatives: Patient denies other symptoms Disposition: [] ED /[] Urgent Care (no appt availability in office) / [x] Appointment(In office/virtual)/ []  [place] [facility]/ [] Home Care/ [] Refused Recommended Disposition /[] [place] [facility]/ []  Follow-up with PCP Additional Notes: N/A  Reason for Disposition  [1] COVID-19 infection suspected by caller or triager AND [2] mild symptoms (cough, fever, or others) AND [3] negative COVID-19 rapid test  Answer Assessment - Initial Assessment Questions 1. COVID-19 DIAGNOSIS: "How do you know that you have COVID?" (e.g., positive lab test or self-test, diagnosed by doctor or NP/PA, symptoms after exposure).     Home test today 2. COVID-19 EXPOSURE: "Was there any known exposure to COVID before the symptoms began?" CDC Definition of close contact: within 6 feet (2 meters) for a total of 15 minutes or more over a 24-hour period.      [place] last week 3. ONSET: "When did the COVID-19 symptoms start?"      Last Friday a light cough 4. WORST SYMPTOM: "What is your worst symptom?" (e.g., cough, fever, shortness of breath, muscle aches)     Diarrhea in the morning 5. COUGH: "Do you have a cough?" If Yes, ask: "How bad is the cough?"       Yes 6. FEVER: "Do you have a fever?" If Yes, ask: "What is your temperature, how was it measured, and when did it start?"     No 7. RESPIRATORY STATUS: "Describe your breathing?" (e.g., normal; shortness of breath, wheezing, unable to speak)      SOB w/activity 8. BETTER-SAME-WORSE: "Are you getting better, staying the same or getting worse compared to yesterday?"  If getting worse, ask, "In what way?"     Same 9. OTHER SYMPTOMS: "Do you have any other symptoms?"  (e.g., chills, fatigue, headache, loss of smell or taste, muscle pain, sore throat)     Wheezing a little comes and goes, nasal  congestion, fatigue 10. HIGH RISK DISEASE: "Do you have any chronic medical problems?" (e.g., asthma, heart or lung disease, weak immune system, obesity, etc.)       Lung issues  Protocols used: Coronavirus (COVID-19) Diagnosed or Suspected-A-AH

## 2023-02-12 ENCOUNTER — Encounter: Payer: Self-pay | Admitting: Internal Medicine

## 2023-02-12 ENCOUNTER — Telehealth (INDEPENDENT_AMBULATORY_CARE_PROVIDER_SITE_OTHER): Payer: Medicare HMO | Admitting: Internal Medicine

## 2023-02-12 VITALS — BP 122/78

## 2023-02-12 DIAGNOSIS — J40 Bronchitis, not specified as acute or chronic: Secondary | ICD-10-CM

## 2023-02-12 DIAGNOSIS — U071 COVID-19: Secondary | ICD-10-CM

## 2023-02-12 MED ORDER — AMOXICILLIN-POT CLAVULANATE 875-125 MG PO TABS
1.0000 | ORAL_TABLET | Freq: Two times a day (BID) | ORAL | 0 refills | Status: AC
Start: 2023-02-12 — End: 2023-02-19

## 2023-02-12 NOTE — Progress Notes (Signed)
Date:  02/12/2023   Name:  [name]   DOB:  April 05, 1961   MRN:  [id number]  This encounter was conducted via video encounter. This platform was deemed appropriate for the issues to be addressed.  The patient was correctly identified.  I advised that I am conducting the visit from a secure room in my office at [facility] clinic.  The patient is located at home. The limitations of this form of encounter were discussed with the patient and he/she agreed to proceed.  Some vital signs will be absent.  Chief Complaint: Covid Positive (Tested Positive for Covid Friday 02/09/2023. Patient had symptoms of fatigue, congestion, and diarrhea. Patient said that is all gone now but still has congestion - green production. No SOB.)  URI  This is a new problem. The current episode started in the past 7 days. The problem has been unchanged. There has been no fever. Associated symptoms include congestion and coughing. Pertinent negatives include no chest pain, diarrhea, sneezing, vomiting or wheezing.    Lab Results  Component Value Date   NA 138 01/09/2023   K 3.5 01/09/2023   CO2 26 01/09/2023   GLUCOSE 92 01/09/2023   BUN 25 (H) 01/09/2023   CREATININE 1.21 01/09/2023   CALCIUM 9.1 01/09/2023   EGFR 83 04/07/2021   GFRNONAA >60 01/09/2023   Lab Results  Component Value Date   CHOL 222 (H) 11/29/2022   HDL 51 11/29/2022   LDLCALC 139 (H) 11/29/2022   TRIG 159 (H) 11/29/2022   CHOLHDL 4.4 11/29/2022   Lab Results  Component Value Date   TSH 2.273 08/25/2014   Lab Results  Component Value Date   HGBA1C 5.9 03/26/2022   Lab Results  Component Value Date   WBC 10.8 (H) 01/09/2023   HGB 13.8 01/09/2023   HCT 44.6 01/09/2023   MCV 90.7 01/09/2023   PLT 173 01/09/2023   Lab Results  Component Value Date   ALT 20 11/29/2022   AST 19 11/29/2022   ALKPHOS 52 11/29/2022   BILITOT 0.7 11/29/2022   No results found for: "25OHVITD2", "25OHVITD3", "VD25OH"   Review of  Systems  Constitutional:  Negative for chills, fatigue and fever.  HENT:  Positive for congestion. Negative for sneezing.   Respiratory:  Positive for cough. Negative for wheezing.   Cardiovascular:  Negative for chest pain.  Gastrointestinal:  Negative for diarrhea and vomiting.  Psychiatric/Behavioral:  Negative for dysphoric mood and sleep disturbance. The patient is not nervous/anxious.     Patient Active Problem List   Diagnosis Date Noted   Not currently working due to disabled status 08/29/2022   BPH with obstruction/lower urinary tract symptoms 08/29/2022   Chronic sinusitis 08/29/2022   Mixed hyperlipidemia 08/29/2022   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   Immunosuppressed status (HCC) 03/21/2022   Prediabetes 03/21/2022   Idiopathic chronic gout of multiple sites without tophus 09/13/2021   Systemic lupus erythematosus (SLE) in adult [facility]) 07/23/2017   Vitamin D deficiency 07/23/2017   Polyp of sigmoid colon    Carpal tunnel syndrome 08/08/2016   Degenerative disc disease 08/08/2016   Sarcoidosis 04/19/2016   Permanent atrial fibrillation (HCC) 03/21/2016   Hernia, hiatal    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/2015   Normal coronary arteries    Typical atrial flutter (HCC)    Tachycardia-induced cardiomyopathy (HCC)    Essential hypertension    Pulmonary fibrosis (HCC)    Nontoxic uninodular goiter 04/20/2014   Mixed connective tissue disease (HCC) 02/03/2014  Mediastinal adenopathy 11/02/2013    Allergies  Allergen Reactions   Doxycycline Palpitations   Allopurinol Diarrhea   Probenecid Nausea Only and Other (See Comments)    Other reaction(s): Headache  Other Reaction(s): Other (see comments)    Past Surgical History:  Procedure Laterality Date   CARDIAC CATHETERIZATION N/A 12/03/2014   Procedure: Left Heart Cath and Coronary Angiography;  Surgeon: Antonieta Iba, MD;  Location: [facility] INVASIVE CV LAB;  Service: Cardiovascular;  Laterality: N/A;    COLONOSCOPY WITH PROPOFOL N/A 11/23/2015   Procedure: COLONOSCOPY WITH PROPOFOL;  Surgeon: Midge Minium, MD;  Location: [facility] ENDOSCOPY;  Service: Endoscopy;  Laterality: N/A;   COLONOSCOPY WITH PROPOFOL N/A 05/04/2017   Procedure: COLONOSCOPY WITH PROPOFOL;  Surgeon: Midge Minium, MD;  Location: [facility];  Service: Endoscopy;  Laterality: N/A;   ESOPHAGOGASTRODUODENOSCOPY (EGD) WITH PROPOFOL N/A 11/23/2015   Procedure: ESOPHAGOGASTRODUODENOSCOPY (EGD) WITH PROPOFOL;  Surgeon: Midge Minium, MD;  Location: [facility] ENDOSCOPY;  Service: Endoscopy;  Laterality: N/A;   HAND SURGERY Left    PERIPHERAL VASCULAR CATHETERIZATION N/A 11/17/2015   Procedure: Upper Extremity Angiography;  Surgeon: Iran Ouch, MD;  Location: [facility] INVASIVE CV LAB;  Service: Cardiovascular;  Laterality: N/A;   POLYPECTOMY  05/04/2017   Procedure: POLYPECTOMY;  Surgeon: Midge Minium, MD;  Location: [facility];  Service: Endoscopy;;    Social History   Tobacco Use   Smoking status: Former    Current packs/day: 0.00    Average packs/day: 1 pack/day for 20.0 years (20.0 ttl pk-yrs)    Types: Cigarettes    Start date: 08/21/1978    Quit date: 08/21/1998    Years since quitting: 24.4    Passive exposure: Past   Smokeless tobacco: Never  Vaping Use   Vaping status: Never Used  Substance Use Topics   Alcohol use: No   Drug use: No     Medication list has been reviewed and updated.  Current Meds  Medication Sig   albuterol (VENTOLIN HFA) 108 (90 Base) MCG/ACT inhaler Inhale into the lungs as needed.   amoxicillin-clavulanate (AUGMENTIN) 875-125 MG tablet Take 1 tablet by mouth 2 (two) times daily for 7 days.   atorvastatin (LIPITOR) 40 MG tablet Take 0.5 tablets (20 mg total) by mouth daily.   azaTHIOprine (IMURAN) 50 MG tablet Take 50 mg by mouth daily.   BENLYSTA 200 MG/ML SOAJ Inject 200 mg into the skin once a week.   colchicine 0.6 MG tablet Take 0.6 mg by mouth daily.   [employer] NASAL ALLERGY  SPRAY 55 MCG/ACT AERO nasal inhaler SMARTSIG:2 Spray(s) Both Nares Daily   diltiazem (CARDIZEM CD) 120 MG 24 hr capsule TAKE 1 CAPSULE TWICE DAILY   enalapril (VASOTEC) 20 MG tablet Take 1 tablet (20 mg total) by mouth 2 (two) times daily.   febuxostat (ULORIC) 40 MG tablet Take 40 mg by mouth daily.   fluticasone (FLONASE) 50 MCG/ACT nasal spray Place into the nose as needed.   fluticasone-salmeterol (ADVAIR) 250-50 MCG/ACT AEPB Inhale into the lungs daily.   hydrocortisone 2.5 % cream Apply 1 Application topically 2 (two) times daily.   hydroxychloroquine (PLAQUENIL) 200 MG tablet Take 1 tablet by mouth 2 (two) times daily.   ketoconazole (NIZORAL) 2 % shampoo Apply topically 3 (three) times a week.   metoprolol tartrate (LOPRESSOR) 100 MG tablet Take 1 tablet (100 mg total) by mouth 2 (two) times daily.   Multiple Vitamin (MULTI-VITAMINS) TABS Take by mouth.   mycophenolate (CELLCEPT) 500 MG tablet Take 2  tablets by mouth 2 (two) times daily.   pantoprazole (PROTONIX) 40 MG tablet Take 40 mg by mouth 2 (two) times daily.   potassium chloride SA (KLOR-CON M) 20 MEQ tablet TAKE 1 TABLET EVERY DAY   predniSONE (DELTASONE) 5 MG tablet Take 10 mg by mouth daily with breakfast.   sildenafil (VIAGRA) 50 MG tablet Take 1-2 tablets (50-100 mg total) by mouth daily as needed for erectile dysfunction.   tamsulosin (FLOMAX) 0.4 MG CAPS capsule Take 1 capsule (0.4 mg total) by mouth daily.   torsemide (DEMADEX) 20 MG tablet TAKE 1/2 TABLET EVERY DAY   warfarin (COUMADIN) 5 MG tablet Take 1 tablet (5 mg total) by mouth daily.       02/12/2023    9:10 AM 08/29/2022    2:44 PM  GAD 7 : Generalized Anxiety Score  Nervous, Anxious, on Edge 0 1  Control/stop worrying 0 0  Worry too much - different things 0 1  Trouble relaxing 0 0  Restless 0 0  Easily annoyed or irritable 0 0  Afraid - awful might happen 0 0  Total GAD 7 Score 0 2  Anxiety Difficulty Not difficult at all Not difficult at all        02/12/2023    9:10 AM 10/26/2022    9:03 AM 08/29/2022    2:44 PM  Depression screen PHQ 2/9  Decreased Interest 1 0 0  Down, Depressed, Hopeless 1 0 3  PHQ - 2 Score 2 0 3  Altered sleeping 0 0 0  Tired, decreased energy 0 0 0  Change in appetite 0 0 0  Feeling bad or failure about yourself  0 0 0  Trouble concentrating 0 0 0  Moving slowly or fidgety/restless 0 0 0  Suicidal thoughts 0 0 0  PHQ-9 Score 2 0 3  Difficult doing work/chores Not difficult at all Not difficult at all Not difficult at all    BP Readings from Last 3 Encounters:  02/12/23 122/78  01/09/23 (!) 150/89  12/19/22 135/85    Physical Exam Constitutional:      Appearance: Normal appearance.  HENT:     Mouth/Throat:     Comments: Voice raspy Pulmonary:     Effort: Pulmonary effort is normal.  Neurological:     Mental Status: He is alert.     Wt Readings from Last 3 Encounters:  01/09/23 236 lb (107 kg)  12/19/22 239 lb (108.4 kg)  11/29/22 238 lb (108 kg)    BP 122/78   Assessment and Plan:  Problem List Items Addressed This Visit   None Visit Diagnoses     Bronchitis due to COVID-19 virus    -  Primary   Relevant Medications   amoxicillin-clavulanate (AUGMENTIN) 875-125 MG tablet      I spent 10 minutes on this encounter, 100% via video. No follow-ups on file.    Reubin Milan, MD [facility] [place]

## 2023-02-26 ENCOUNTER — Ambulatory Visit (INDEPENDENT_AMBULATORY_CARE_PROVIDER_SITE_OTHER): Payer: Medicare HMO | Admitting: Internal Medicine

## 2023-02-26 ENCOUNTER — Encounter: Payer: Self-pay | Admitting: Internal Medicine

## 2023-02-26 VITALS — BP 128/74 | HR 74 | Ht 70.0 in | Wt 244.0 lb

## 2023-02-26 DIAGNOSIS — E782 Mixed hyperlipidemia: Secondary | ICD-10-CM

## 2023-02-26 DIAGNOSIS — I10 Essential (primary) hypertension: Secondary | ICD-10-CM | POA: Diagnosis not present

## 2023-02-26 DIAGNOSIS — Z1159 Encounter for screening for other viral diseases: Secondary | ICD-10-CM | POA: Diagnosis not present

## 2023-02-26 DIAGNOSIS — E041 Nontoxic single thyroid nodule: Secondary | ICD-10-CM | POA: Diagnosis not present

## 2023-02-26 DIAGNOSIS — Z0000 Encounter for general adult medical examination without abnormal findings: Secondary | ICD-10-CM | POA: Diagnosis not present

## 2023-02-26 DIAGNOSIS — N401 Enlarged prostate with lower urinary tract symptoms: Secondary | ICD-10-CM

## 2023-02-26 DIAGNOSIS — M351 Other overlap syndromes: Secondary | ICD-10-CM

## 2023-02-26 DIAGNOSIS — I4821 Permanent atrial fibrillation: Secondary | ICD-10-CM

## 2023-02-26 DIAGNOSIS — N138 Other obstructive and reflux uropathy: Secondary | ICD-10-CM

## 2023-02-26 DIAGNOSIS — M329 Systemic lupus erythematosus, unspecified: Secondary | ICD-10-CM

## 2023-02-26 DIAGNOSIS — Z23 Encounter for immunization: Secondary | ICD-10-CM | POA: Diagnosis not present

## 2023-02-26 DIAGNOSIS — E559 Vitamin D deficiency, unspecified: Secondary | ICD-10-CM

## 2023-02-26 DIAGNOSIS — R7303 Prediabetes: Secondary | ICD-10-CM

## 2023-02-26 LAB — POCT URINALYSIS DIPSTICK
Bilirubin, UA: NEGATIVE
Blood, UA: NEGATIVE
Glucose, UA: NEGATIVE
Ketones, UA: NEGATIVE
Leukocytes, UA: NEGATIVE
Nitrite, UA: NEGATIVE
Protein, UA: NEGATIVE
Spec Grav, UA: 1.015 (ref 1.010–1.025)
Urobilinogen, UA: 0.2 U/dL
pH, UA: 5 (ref 5.0–8.0)

## 2023-02-26 NOTE — Assessment & Plan Note (Signed)
Followed by Rheum on multiple medications.

## 2023-02-26 NOTE — Assessment & Plan Note (Signed)
Lab Results  Component Value Date   HGBA1C 5.9 03/26/2022  Managed with diet changes

## 2023-02-26 NOTE — Assessment & Plan Note (Signed)
LDL is  Lab Results  Component Value Date   LDLCALC 139 (H) 11/29/2022   Currently being treated with atorvastatin with good compliance and no concerns.

## 2023-02-26 NOTE — Assessment & Plan Note (Signed)
Rate controlled and anticoagulated with warfarin.

## 2023-02-26 NOTE — Progress Notes (Signed)
Date:  02/26/2023   Name:  [name]   DOB:  [birth year]   MRN:  [id number]   Chief Complaint: Annual Exam [name] is a 62 y.o. male who presents today for his Complete Annual Exam. He feels well. He reports exercising - some. He reports he is sleeping well.   Colonoscopy: 02/2020  Immunization History  Administered Date(s) Administered   Influenza, Quadrivalent, Recombinant, Inj, Pf 02/11/2022   Influenza, Seasonal, Injecte, Preservative Fre 02/26/2023   Influenza,inj,Quad PF,6+ Mos 02/03/2020   Influenza-Unspecified 02/25/2021   PFIZER Comirnaty(Gray Top)Covid-19 Tri-Sucrose Vaccine 02/28/2020, 10/16/2020, 02/25/2021   PFIZER(Purple Top)SARS-COV-2 Vaccination 08/08/2019, 09/02/2019, 02/28/2020   PNEUMOCOCCAL CONJUGATE-20 02/11/2022   Pfizer Covid-19 Vaccine Bivalent Booster 39yrs & up 02/12/2021   Pfizer(Comirnaty)Fall Seasonal Vaccine 12 years and older 02/10/2022   Respiratory Syncytial Virus Vaccine,Recomb Aduvanted(Arexvy) 02/11/2022   Tdap 03/17/2016, 02/12/2021   Health Maintenance Due  Topic Date Due   HIV Screening  Never done   Hepatitis C Screening  Never done   Zoster Vaccines- Shingrix (1 of 2) Never done   COVID-19 Vaccine (8 - 2023-24 season) 01/28/2023    No results found for: "PSA1", "PSA"  Hypertension This is a chronic problem. The problem is controlled. Pertinent negatives include no chest pain, headaches, palpitations or shortness of breath. Past treatments include ACE inhibitors, calcium channel blockers, beta blockers and diuretics. The current treatment provides significant improvement. Hypertensive end-organ damage includes CAD/MI. There is no history of kidney disease or CVA.  Hyperlipidemia This is a chronic problem. The problem is controlled. Pertinent negatives include no chest pain, myalgias or shortness of breath. Current antihyperlipidemic treatment includes statins.  Diabetes He presents for his follow-up diabetic  visit. Diabetes type: prediabetes. Pertinent negatives for hypoglycemia include no dizziness, headaches or nervousness/anxiousness. Pertinent negatives for diabetes include no chest pain and no fatigue. Pertinent negatives for diabetic complications include no CVA.  SLE/MCTD - on multiple medications from Rheum and feels that symptoms are stable.  He is working to taper off of prednisone; currently on 5 mg per day.  Lab Results  Component Value Date   NA 138 01/09/2023   K 3.5 01/09/2023   CO2 26 01/09/2023   GLUCOSE 92 01/09/2023   BUN 25 (H) 01/09/2023   CREATININE 1.21 01/09/2023   CALCIUM 9.1 01/09/2023   EGFR 83 04/07/2021   GFRNONAA >60 01/09/2023   Lab Results  Component Value Date   CHOL 222 (H) 11/29/2022   HDL 51 11/29/2022   LDLCALC 139 (H) 11/29/2022   TRIG 159 (H) 11/29/2022   CHOLHDL 4.4 11/29/2022   Lab Results  Component Value Date   TSH 2.273 08/25/2014   Lab Results  Component Value Date   HGBA1C 5.9 03/26/2022   Lab Results  Component Value Date   WBC 10.8 (H) 01/09/2023   HGB 13.8 01/09/2023   HCT 44.6 01/09/2023   MCV 90.7 01/09/2023   PLT 173 01/09/2023   Lab Results  Component Value Date   ALT 20 11/29/2022   AST 19 11/29/2022   ALKPHOS 52 11/29/2022   BILITOT 0.7 11/29/2022   No results found for: "25OHVITD2", "25OHVITD3", "VD25OH"   Review of Systems  Constitutional:  Negative for appetite change, chills, diaphoresis, fatigue and unexpected weight change.  HENT:  Negative for hearing loss, tinnitus, trouble swallowing and voice change.   Eyes:  Negative for visual disturbance.  Respiratory:  Negative for choking, shortness of breath and wheezing.   Cardiovascular:  Negative for  chest pain, palpitations and leg swelling.  Gastrointestinal:  Negative for abdominal pain, blood in stool, constipation and diarrhea.  Genitourinary:  Negative for difficulty urinating, dysuria and frequency.  Musculoskeletal:  Negative for arthralgias, back  pain and myalgias.  Skin:  Negative for color change and rash.  Neurological:  Negative for dizziness, syncope and headaches.  Hematological:  Negative for adenopathy.  Psychiatric/Behavioral:  Negative for dysphoric mood and sleep disturbance. The patient is not nervous/anxious.     Patient Active Problem List   Diagnosis Date Noted   Not currently working due to disabled status 08/29/2022   BPH with obstruction/lower urinary tract symptoms 08/29/2022   Chronic sinusitis 08/29/2022   Mixed hyperlipidemia 08/29/2022   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   Immunosuppressed status (HCC) 03/21/2022   Prediabetes 03/21/2022   Idiopathic chronic gout of multiple sites without tophus 09/13/2021   Systemic lupus erythematosus (SLE) in adult (HCC) 07/23/2017   Vitamin D deficiency 07/23/2017   Polyp of sigmoid colon    Carpal tunnel syndrome 08/08/2016   Degenerative disc disease 08/08/2016   Sarcoidosis 04/19/2016   Permanent atrial fibrillation (HCC) 03/21/2016   Hernia, hiatal    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/2015   Normal coronary arteries    Typical atrial flutter (HCC)    Tachycardia-induced cardiomyopathy (HCC)    Essential hypertension    Pulmonary fibrosis (HCC)    Nontoxic uninodular goiter 04/20/2014   Mixed connective tissue disease (HCC) 02/03/2014    Allergies  Allergen Reactions   Doxycycline Palpitations   Allopurinol Diarrhea   Probenecid Nausea Only and Other (See Comments)    Other reaction(s): Headache  Other Reaction(s): Other (see comments)    Past Surgical History:  Procedure Laterality Date   CARDIAC CATHETERIZATION N/A 12/03/2014   Procedure: Left Heart Cath and Coronary Angiography;  Surgeon: Devin Iba, MD;  Location: [facility] INVASIVE CV LAB;  Service: Cardiovascular;  Laterality: N/A;   COLONOSCOPY WITH PROPOFOL N/A 11/23/2015   Procedure: COLONOSCOPY WITH PROPOFOL;  Surgeon: Devin Minium, MD;  Location: [facility] ENDOSCOPY;  Service: Endoscopy;   Laterality: N/A;   COLONOSCOPY WITH PROPOFOL N/A 05/04/2017   Procedure: COLONOSCOPY WITH PROPOFOL;  Surgeon: Devin Minium, MD;  Location: [facility];  Service: Endoscopy;  Laterality: N/A;   ESOPHAGOGASTRODUODENOSCOPY (EGD) WITH PROPOFOL N/A 11/23/2015   Procedure: ESOPHAGOGASTRODUODENOSCOPY (EGD) WITH PROPOFOL;  Surgeon: Devin Minium, MD;  Location: [facility] ENDOSCOPY;  Service: Endoscopy;  Laterality: N/A;   HAND SURGERY Left    PERIPHERAL VASCULAR CATHETERIZATION N/A 11/17/2015   Procedure: Upper Extremity Angiography;  Surgeon: Iran Ouch, MD;  Location: [facility];  Service: Cardiovascular;  Laterality: N/A;   POLYPECTOMY  05/04/2017   Procedure: POLYPECTOMY;  Surgeon: Devin Minium, MD;  Location: [facility];  Service: Endoscopy;;    Social History   Tobacco Use   Smoking status: Former    Current packs/day: 0.00    Average packs/day: 1 pack/day for 20.0 years (20.0 ttl pk-yrs)    Types: Cigarettes    Start date: 08/21/1978    Quit date: 08/21/1998    Years since quitting: 24.5    Passive exposure: Past   Smokeless tobacco: Never  Vaping Use   Vaping status: Never Used  Substance Use Topics   Alcohol use: No   Drug use: No     Medication list has been reviewed and updated.  Current Meds  Medication Sig   albuterol (VENTOLIN HFA) 108 (90 Base) MCG/ACT inhaler Inhale into the lungs as needed.  atorvastatin (LIPITOR) 40 MG tablet Take 0.5 tablets (20 mg total) by mouth daily.   azaTHIOprine (IMURAN) 50 MG tablet Take 50 mg by mouth daily.   BENLYSTA 200 MG/ML SOAJ Inject 200 mg into the skin once a week.   colchicine 0.6 MG tablet Take 0.6 mg by mouth daily.   [employer] NASAL ALLERGY SPRAY 55 MCG/ACT AERO nasal inhaler SMARTSIG:2 Spray(s) Both Nares Daily   diltiazem (CARDIZEM CD) 120 MG 24 hr capsule TAKE 1 CAPSULE TWICE DAILY   enalapril (VASOTEC) 20 MG tablet Take 1 tablet (20 mg total) by mouth 2 (two) times daily.   febuxostat (ULORIC) 40 MG  tablet Take 40 mg by mouth daily.   fluticasone (FLONASE) 50 MCG/ACT nasal spray Place into the nose as needed.   fluticasone-salmeterol (ADVAIR) 250-50 MCG/ACT AEPB Inhale into the lungs daily.   hydrocortisone 2.5 % cream Apply 1 Application topically 2 (two) times daily.   hydroxychloroquine (PLAQUENIL) 200 MG tablet Take 1 tablet by mouth 2 (two) times daily.   ketoconazole (NIZORAL) 2 % shampoo Apply topically 3 (three) times a week.   metoprolol tartrate (LOPRESSOR) 100 MG tablet Take 1 tablet (100 mg total) by mouth 2 (two) times daily.   Multiple Vitamin (MULTI-VITAMINS) TABS Take by mouth.   mycophenolate (CELLCEPT) 500 MG tablet Take 2 tablets by mouth 2 (two) times daily.   pantoprazole (PROTONIX) 40 MG tablet Take 40 mg by mouth 2 (two) times daily.   potassium chloride SA (KLOR-CON M) 20 MEQ tablet TAKE 1 TABLET EVERY DAY   predniSONE (DELTASONE) 5 MG tablet Take 10 mg by mouth daily with breakfast.   sildenafil (VIAGRA) 50 MG tablet Take 1-2 tablets (50-100 mg total) by mouth daily as needed for erectile dysfunction.   tamsulosin (FLOMAX) 0.4 MG CAPS capsule Take 1 capsule (0.4 mg total) by mouth daily.   torsemide (DEMADEX) 20 MG tablet TAKE 1/2 TABLET EVERY DAY   warfarin (COUMADIN) 5 MG tablet Take 1 tablet (5 mg total) by mouth daily.       02/26/2023    8:11 AM 02/12/2023    9:10 AM 08/29/2022    2:44 PM  GAD 7 : Generalized Anxiety Score  Nervous, Anxious, on Edge 0 0 1  Control/stop worrying 0 0 0  Worry too much - different things 0 0 1  Trouble relaxing 1 0 0  Restless 0 0 0  Easily annoyed or irritable 0 0 0  Afraid - awful might happen 1 0 0  Total GAD 7 Score 2 0 2  Anxiety Difficulty Not difficult at all Not difficult at all Not difficult at all       02/26/2023    8:11 AM 02/12/2023    9:10 AM 10/26/2022    9:03 AM  Depression screen PHQ 2/9  Decreased Interest 0 1 0  Down, Depressed, Hopeless 0 1 0  PHQ - 2 Score 0 2 0  Altered sleeping 0 0 0  Tired,  decreased energy 0 0 0  Change in appetite 0 0 0  Feeling bad or failure about yourself  0 0 0  Trouble concentrating 0 0 0  Moving slowly or fidgety/restless 0 0 0  Suicidal thoughts 0 0 0  PHQ-9 Score 0 2 0  Difficult doing work/chores Not difficult at all Not difficult at all Not difficult at all    BP Readings from Last 3 Encounters:  02/26/23 128/74  02/12/23 122/78  01/09/23 (!) 150/89    Physical Exam Vitals  and nursing note reviewed.  Constitutional:      Appearance: Normal appearance. He is well-developed.  HENT:     Head: Normocephalic.     Right Ear: Tympanic membrane, ear canal and external ear normal.     Left Ear: Tympanic membrane, ear canal and external ear normal.     Nose: Nose normal.  Eyes:     Conjunctiva/sclera: Conjunctivae normal.     Pupils: Pupils are equal, round, and reactive to light.  Neck:     Thyroid: No thyromegaly.     Vascular: No carotid bruit.  Cardiovascular:     Rate and Rhythm: Normal rate and regular rhythm.     Heart sounds: Normal heart sounds.  Pulmonary:     Effort: Pulmonary effort is normal.     Breath sounds: Normal breath sounds. No wheezing.  Chest:  Breasts:    Right: No mass.     Left: No mass.  Abdominal:     General: Bowel sounds are normal.     Palpations: Abdomen is soft.     Tenderness: There is no abdominal tenderness.  Musculoskeletal:        General: Normal range of motion.     Cervical back: Normal range of motion and neck supple.  Lymphadenopathy:     Cervical: No cervical adenopathy.  Skin:    General: Skin is warm and dry.     Comments: Scattered excoriations on both LE  Neurological:     Mental Status: He is alert and oriented to person, place, and time.     Deep Tendon Reflexes: Reflexes are normal and symmetric.  Psychiatric:        Attention and Perception: Attention normal.        Mood and Affect: Mood normal.        Thought Content: Thought content normal.    Lab Results  Component  Value Date   COLORU yellow 02/26/2023   CLARITYU clear 02/26/2023   GLUCOSEUR Negative 02/26/2023   BILIRUBINUR neg 02/26/2023   KETONESU neg 02/26/2023   SPECGRAV 1.015 02/26/2023   RBCUR neg 02/26/2023   PHUR 5.0 02/26/2023   PROTEINUR Negative 02/26/2023   UROBILINOGEN 0.2 02/26/2023   LEUKOCYTESUR Negative 02/26/2023     Wt Readings from Last 3 Encounters:  02/26/23 244 lb (110.7 kg)  01/09/23 236 lb (107 kg)  12/19/22 239 lb (108.4 kg)    BP 128/74   Pulse 74   Ht 5\' 10"  (1.778 m)   Wt 244 lb (110.7 kg)   SpO2 96%   BMI 35.01 kg/m   Assessment and Plan:  Problem List Items Addressed This Visit       Unprioritized   BPH with obstruction/lower urinary tract symptoms   Relevant Orders   PSA   Essential hypertension (Chronic)    Normal exam with stable BP on diltiazem, metoprolol, enalapril and demadex. No concerns or side effects to current medication. No change in regimen; continue low sodium diet.       Relevant Orders   CBC with Differential/Platelet   Comprehensive metabolic panel   POCT urinalysis dipstick   Mixed connective tissue disease (HCC)    Followed by Rheum on multiple medications.      Mixed hyperlipidemia    LDL is  Lab Results  Component Value Date   LDLCALC 139 (H) 11/29/2022   Currently being treated with atorvastatin with good compliance and no concerns.       Relevant Orders   Lipid panel  Nontoxic uninodular goiter    Last [place] 2015 - biopsy: RIGHT THYROID  NEGATIVE FOR MALIGNANT CELLS.  SPECIMEN CONSISTS OF ABUNDANT FOLLICULAR CELLS WITH SCANT COLLOID. MOST  CONSISTANT WITH ADENOMATOID NODULE.  Repeat labs, consider [place]      Relevant Orders   TSH + free T4   Permanent atrial fibrillation (HCC)    Rate controlled and anticoagulated with warfarin.      Relevant Orders   Comprehensive metabolic panel   Prediabetes    Lab Results  Component Value Date   HGBA1C 5.9 03/26/2022  Managed with diet changes       Relevant Orders   Hemoglobin A1c   Systemic lupus erythematosus (SLE) in adult [facility])   Vitamin D deficiency    No recent labs and not currently on supplements.      Relevant Orders   VITAMIN D 25 Hydroxy (Vit-D Deficiency, Fractures)   Other Visit Diagnoses     Annual physical exam    -  Primary   continue efforts at healthy diet and exercise Flu vaccine today   Need for hepatitis C screening test       Relevant Orders   Hepatitis C antibody   Need for influenza vaccination       Relevant Orders   Flu vaccine trivalent PF, 6mos and older(Flulaval,Afluria,Fluarix,Fluzone) (Completed)       No follow-ups on file.    Reubin Milan, MD [facility] [place]

## 2023-02-26 NOTE — Assessment & Plan Note (Signed)
No recent labs and not currently on supplements.

## 2023-02-26 NOTE — Assessment & Plan Note (Signed)
Normal exam with stable BP on diltiazem, metoprolol, enalapril and demadex. No concerns or side effects to current medication. No change in regimen; continue low sodium diet.

## 2023-02-26 NOTE — Assessment & Plan Note (Addendum)
Last [place] 2015 - biopsy: RIGHT THYROID  NEGATIVE FOR MALIGNANT CELLS.  SPECIMEN CONSISTS OF ABUNDANT FOLLICULAR CELLS WITH SCANT COLLOID. MOST  CONSISTANT WITH ADENOMATOID NODULE.  Repeat labs, consider [place]

## 2023-02-27 LAB — CBC WITH DIFFERENTIAL/PLATELET
Basophils Absolute: 0.1 10*3/uL (ref 0.0–0.2)
Basos: 1 %
EOS (ABSOLUTE): 0.2 10*3/uL (ref 0.0–0.4)
Eos: 2 %
Hematocrit: 42.9 % (ref 37.5–51.0)
Hemoglobin: 13.3 g/dL (ref 13.0–17.7)
Immature Grans (Abs): 0.1 10*3/uL (ref 0.0–0.1)
Immature Granulocytes: 1 %
Lymphocytes Absolute: 1.1 10*3/uL (ref 0.7–3.1)
Lymphs: 13 %
MCH: 27.1 pg (ref 26.6–33.0)
MCHC: 31 g/dL — ABNORMAL LOW (ref 31.5–35.7)
MCV: 88 fL (ref 79–97)
Monocytes Absolute: 0.9 10*3/uL (ref 0.1–0.9)
Monocytes: 10 %
Neutrophils Absolute: 6.7 10*3/uL (ref 1.4–7.0)
Neutrophils: 73 %
Platelets: 182 10*3/uL (ref 150–450)
RBC: 4.9 x10E6/uL (ref 4.14–5.80)
RDW: 13.5 % (ref 11.6–15.4)
WBC: 9 10*3/uL (ref 3.4–10.8)

## 2023-02-27 LAB — HEMOGLOBIN A1C
Est. average glucose Bld gHb Est-mCnc: 120 mg/dL
Hgb A1c MFr Bld: 5.8 % — ABNORMAL HIGH (ref 4.8–5.6)

## 2023-02-27 LAB — COMPREHENSIVE METABOLIC PANEL
ALT: 23 [IU]/L (ref 0–44)
AST: 17 [IU]/L (ref 0–40)
Albumin: 4 g/dL (ref 3.9–4.9)
Alkaline Phosphatase: 59 [IU]/L (ref 44–121)
BUN/Creatinine Ratio: 17 (ref 10–24)
BUN: 17 mg/dL (ref 8–27)
Bilirubin Total: <0.2 mg/dL (ref 0.0–1.2)
CO2: 23 mmol/L (ref 20–29)
Calcium: 9.1 mg/dL (ref 8.6–10.2)
Chloride: 105 mmol/L (ref 96–106)
Creatinine, Ser: 1.02 mg/dL (ref 0.76–1.27)
Globulin, Total: 2.3 g/dL (ref 1.5–4.5)
Glucose: 90 mg/dL (ref 70–99)
Potassium: 3.9 mmol/L (ref 3.5–5.2)
Sodium: 144 mmol/L (ref 134–144)
Total Protein: 6.3 g/dL (ref 6.0–8.5)
eGFR: 83 mL/min/{1.73_m2} (ref >59)

## 2023-02-27 LAB — LIPID PANEL
Chol/HDL Ratio: 4.6 {ratio} (ref 0.0–5.0)
Cholesterol, Total: 210 mg/dL — ABNORMAL HIGH (ref 100–199)
HDL: 46 mg/dL (ref >39)
LDL Chol Calc (NIH): 127 mg/dL — ABNORMAL HIGH (ref 0–99)
Triglycerides: 210 mg/dL — ABNORMAL HIGH (ref 0–149)
VLDL Cholesterol Cal: 37 mg/dL (ref 5–40)

## 2023-02-27 LAB — VITAMIN D 25 HYDROXY (VIT D DEFICIENCY, FRACTURES): Vit D, 25-Hydroxy: 74.6 ng/mL (ref 30.0–100.0)

## 2023-02-27 LAB — PSA: Prostate Specific Ag, Serum: 10.9 ng/mL — ABNORMAL HIGH (ref 0.0–4.0)

## 2023-02-27 LAB — HEPATITIS C ANTIBODY: Hep C Virus Ab: NONREACTIVE

## 2023-02-27 LAB — TSH+FREE T4
Free T4: 1.35 ng/dL (ref 0.82–1.77)
TSH: 1.47 u[IU]/mL (ref 0.450–4.500)

## 2023-03-06 ENCOUNTER — Ambulatory Visit: Payer: Medicare HMO | Admitting: Urology

## 2023-03-06 ENCOUNTER — Encounter: Payer: Self-pay | Admitting: Urology

## 2023-03-06 VITALS — BP 140/84 | HR 71 | Ht 70.0 in | Wt 241.0 lb

## 2023-03-06 DIAGNOSIS — R972 Elevated prostate specific antigen [PSA]: Secondary | ICD-10-CM | POA: Diagnosis not present

## 2023-03-06 DIAGNOSIS — Z87438 Personal history of other diseases of male genital organs: Secondary | ICD-10-CM

## 2023-03-06 DIAGNOSIS — N138 Other obstructive and reflux uropathy: Secondary | ICD-10-CM

## 2023-03-06 NOTE — Patient Instructions (Signed)
Avoid ejaculations for at least 3 days prior to your PSA blood test  Prostate Cancer Screening  Prostate cancer screening is testing that is done to check for the presence of prostate cancer in men. The prostate gland is a walnut-sized gland that is located below the bladder and in front of the rectum in males. The function of the prostate is to add fluid to semen during ejaculation. Prostate cancer is one of the most common types of cancer in men. Who should have prostate cancer screening? Screening recommendations vary based on age and other risk factors, as well as between the professional organizations who make the recommendations. In general, screening is recommended if: You are age 30 to 70 and have an average risk for prostate cancer. You should talk with your health care provider about your need for screening and how often screening should be done. Because most prostate cancers are slow growing and will not cause death, screening in this age group is generally reserved for men who have a 10- to 15-year life expectancy. You are younger than age 68, and you have these risk factors: Having a father, brother, or uncle who has been diagnosed with prostate cancer. The risk is higher if your family member's cancer occurred at an early age or if you have multiple family members with prostate cancer at an early age. Being a male who is [place] or is of [place] or sub-Saharan African descent. In general, screening is not recommended if: You are younger than age 43. You are between the ages of 25 and 47 and you have no risk factors. You are 43 years of age or older. At this age, the risks that screening can cause are greater than the benefits that it may provide. If you are at high risk for prostate cancer, your health care provider may recommend that you have screenings more often or that you start screening at a younger age. How is screening for prostate cancer done? The recommended prostate cancer  screening test is a blood test called the prostate-specific antigen (PSA) test. PSA is a protein that is made in the prostate. As you age, your prostate naturally produces more PSA. Abnormally high PSA levels may be caused by: Prostate cancer. An enlarged prostate that is not caused by cancer (benign prostatic hyperplasia, or BPH). This condition is very common in older men. A prostate gland infection (prostatitis) or urinary tract infection. Certain medicines such as male hormones (like testosterone) or other medicines that raise testosterone levels. A rectal exam may be done as part of prostate cancer screening to help provide information about the size of your prostate gland. When a rectal exam is performed, it should be done after the PSA level is drawn to avoid any effect on the results. Depending on the PSA results, you may need more tests, such as: A physical exam to check the size of your prostate gland, if not done as part of screening. Blood and imaging tests. A procedure to remove tissue samples from your prostate gland for testing (biopsy). This is the only way to know for certain if you have prostate cancer. What are the benefits of prostate cancer screening? Screening can help to identify cancer at an early stage, before symptoms start and when the cancer can be treated more easily. There is a small chance that screening may lower your risk of dying from prostate cancer. The chance is small because prostate cancer is a slow-growing cancer, and most men with prostate cancer  die from a different cause. What are the risks of prostate cancer screening? The main risk of prostate cancer screening is diagnosing and treating prostate cancer that would never have caused any symptoms or problems. This is called overdiagnosisand overtreatment. PSA screening cannot tell you if your PSA is high due to cancer or a different cause. A prostate biopsy is the only procedure to diagnose prostate cancer.  Even the results of a biopsy may not tell you if your cancer needs to be treated. Slow-growing prostate cancer may not need any treatment other than monitoring, so diagnosing and treating it may cause unnecessary stress or other side effects. Questions to ask your health care provider When should I start prostate cancer screening? What is my risk for prostate cancer? How often do I need screening? What type of screening tests do I need? How do I get my test results? What do my results mean? Do I need treatment? Where to find more information The American Cancer Society: www.cancer.org American Urological Association: www.auanet.org Contact a health care provider if: You have difficulty urinating. You have pain when you urinate or ejaculate. You have blood in your urine or semen. You have pain in your back or in the area of your prostate. Summary Prostate cancer is a common type of cancer in men. The prostate gland is located below the bladder and in front of the rectum. This gland adds fluid to semen during ejaculation. Prostate cancer screening may identify cancer at an early stage, when the cancer can be treated more easily and is less likely to have spread to other areas of the body. The prostate-specific antigen (PSA) test is the recommended screening test for prostate cancer, but it has associated risks. Discuss the risks and benefits of prostate cancer screening with your health care provider. If you are age 57 or older, the risks that screening can cause are greater than the benefits that it may provide. This information is not intended to replace advice given to you by your health care provider. Make sure you discuss any questions you have with your health care provider. Document Revised: 11/08/2020 Document Reviewed: 11/08/2020 Elsevier Patient Education  2024 [employer].

## 2023-03-06 NOTE — Progress Notes (Signed)
03/06/2023 2:52 PM   [name] 05/13/1961 [id number]  Reason for visit: Elevated PSA, history of prostate nodule, ED  HPI: 62 year old African-American with a number of comorbidities including obesity with BMI of 35, A-fib on Coumadin, diuretic use, history of a prostate nodule within normal prostate MRI in 2022.  Michiel Cowboy, PA felt a prostate nodule in 2022 and PSA was normal at 2.7, prostate MRI 06/2020 showed a 44 g prostate with no suspicious findings.  Repeat PSA April 2023 was stable at 2.9.  He recently had a PSA with PCP on 02/26/2023 that was elevated at 10.9.  Dipstick urinalysis that day was benign.  We reviewed the implications of an elevated PSA and the uncertainty surrounding it. In general, a man's PSA increases with age and is produced by both normal and cancerous prostate tissue. The differential diagnosis for elevated PSA includes BPH, prostate cancer, infection, recent intercourse/ejaculation, recent urethroscopic manipulation (foley placement/cystoscopy) or trauma, and prostatitis. Management of an elevated PSA can include observation or prostate biopsy and we discussed this in detail. Our goal is to detect clinically significant prostate cancers, and manage with either active surveillance, surgery, or radiation for localized disease. Risks of prostate biopsy include bleeding, infection (including life threatening sepsis), pain, and lower urinary symptoms. Hematuria, hematospermia, and blood in the stool are all common after biopsy and can persist up to 4 weeks.  We discussed the concept of false elevation, and the need for 2 elevated PSAs prior to proceeding with prostate biopsy or prostate MRI.  PSA reflex to free in 4 weeks, contact with results.  If downtrending follow-up to normal, if remains significantly elevated will schedule prostate biopsy  Sondra Come, MD  [facility] [address], [address] [place], [place] [address] [phone]

## 2023-03-07 ENCOUNTER — Ambulatory Visit: Payer: Medicare HMO | Attending: Cardiovascular Disease

## 2023-03-07 DIAGNOSIS — Z5181 Encounter for therapeutic drug level monitoring: Secondary | ICD-10-CM | POA: Diagnosis not present

## 2023-03-07 DIAGNOSIS — I4892 Unspecified atrial flutter: Secondary | ICD-10-CM | POA: Diagnosis not present

## 2023-03-07 LAB — POCT INR: INR: 2.8 (ref 2.0–3.0)

## 2023-03-07 NOTE — Patient Instructions (Signed)
Continue on same dosage 1 tablet every day EXCEPT 1/2 TABLET ONLY ON MONDAYS.  Recheck in 6 weeks [facility] [phone] - call if you are put on any antibiotics or steroids

## 2023-03-15 ENCOUNTER — Other Ambulatory Visit: Payer: Self-pay | Admitting: Specialist

## 2023-03-15 DIAGNOSIS — J849 Interstitial pulmonary disease, unspecified: Secondary | ICD-10-CM

## 2023-03-15 DIAGNOSIS — R0602 Shortness of breath: Secondary | ICD-10-CM

## 2023-03-20 ENCOUNTER — Ambulatory Visit
Admission: RE | Admit: 2023-03-20 | Discharge: 2023-03-20 | Disposition: A | Discharge status: Home / Self Care | Payer: Medicare HMO | Source: Ambulatory Visit | Attending: Specialist | Admitting: Specialist

## 2023-03-20 DIAGNOSIS — R0602 Shortness of breath: Secondary | ICD-10-CM | POA: Diagnosis present

## 2023-03-20 DIAGNOSIS — J849 Interstitial pulmonary disease, unspecified: Secondary | ICD-10-CM | POA: Insufficient documentation

## 2023-03-28 ENCOUNTER — Other Ambulatory Visit: Payer: Self-pay | Admitting: Cardiovascular Disease

## 2023-04-12 ENCOUNTER — Other Ambulatory Visit: Payer: Self-pay | Admitting: Cardiovascular Disease

## 2023-04-12 NOTE — Telephone Encounter (Signed)
Refill request for warfarin:  Last INR was 2.8 on 03/07/23 Next INR due 04/18/23 LOV was 11/29/22  Refill approved.

## 2023-04-12 NOTE — Telephone Encounter (Signed)
Refill request

## 2023-04-18 ENCOUNTER — Ambulatory Visit: Payer: Medicare HMO | Attending: Cardiovascular Disease

## 2023-04-18 DIAGNOSIS — I4892 Unspecified atrial flutter: Secondary | ICD-10-CM

## 2023-04-18 DIAGNOSIS — Z5181 Encounter for therapeutic drug level monitoring: Secondary | ICD-10-CM | POA: Diagnosis not present

## 2023-04-18 LAB — POCT INR: INR: 2.1 (ref 2.0–3.0)

## 2023-04-18 NOTE — Patient Instructions (Signed)
Continue on same dosage 1 tablet every day EXCEPT 1/2 TABLET ONLY ON MONDAYS.  Recheck in 6 weeks [facility] [phone] - call if you are put on any antibiotics or steroids

## 2023-05-02 DIAGNOSIS — E049 Nontoxic goiter, unspecified: Secondary | ICD-10-CM | POA: Diagnosis not present

## 2023-05-02 DIAGNOSIS — R9389 Abnormal findings on diagnostic imaging of other specified body structures: Secondary | ICD-10-CM | POA: Diagnosis not present

## 2023-05-03 DIAGNOSIS — M1A09X0 Idiopathic chronic gout, multiple sites, without tophus (tophi): Secondary | ICD-10-CM | POA: Diagnosis not present

## 2023-05-03 DIAGNOSIS — Z7960 Long term (current) use of unspecified immunomodulators and immunosuppressants: Secondary | ICD-10-CM | POA: Diagnosis not present

## 2023-05-03 DIAGNOSIS — M351 Other overlap syndromes: Secondary | ICD-10-CM | POA: Diagnosis not present

## 2023-05-03 DIAGNOSIS — M329 Systemic lupus erythematosus, unspecified: Secondary | ICD-10-CM | POA: Diagnosis not present

## 2023-05-08 DIAGNOSIS — L218 Other seborrheic dermatitis: Secondary | ICD-10-CM | POA: Diagnosis not present

## 2023-05-09 ENCOUNTER — Telehealth: Payer: Self-pay

## 2023-05-09 ENCOUNTER — Other Ambulatory Visit: Payer: Self-pay | Admitting: Cardiovascular Disease

## 2023-05-09 NOTE — Telephone Encounter (Signed)
Patient requesting to speak with Casimiro Needle in Coumadin, did not want me to take a message.

## 2023-05-12 ENCOUNTER — Other Ambulatory Visit: Payer: Self-pay | Admitting: Internal Medicine

## 2023-05-12 DIAGNOSIS — E782 Mixed hyperlipidemia: Secondary | ICD-10-CM

## 2023-05-14 ENCOUNTER — Ambulatory Visit (INDEPENDENT_AMBULATORY_CARE_PROVIDER_SITE_OTHER): Payer: Medicare HMO | Admitting: Internal Medicine

## 2023-05-14 ENCOUNTER — Encounter: Payer: Self-pay | Admitting: Internal Medicine

## 2023-05-14 VITALS — BP 126/74 | HR 78 | Temp 97.9°F | Ht 70.0 in | Wt 251.0 lb

## 2023-05-14 DIAGNOSIS — B9689 Other specified bacterial agents as the cause of diseases classified elsewhere: Secondary | ICD-10-CM

## 2023-05-14 DIAGNOSIS — D849 Immunodeficiency, unspecified: Secondary | ICD-10-CM | POA: Diagnosis not present

## 2023-05-14 DIAGNOSIS — J8410 Pulmonary fibrosis, unspecified: Secondary | ICD-10-CM

## 2023-05-14 DIAGNOSIS — I5022 Chronic systolic (congestive) heart failure: Secondary | ICD-10-CM | POA: Insufficient documentation

## 2023-05-14 DIAGNOSIS — J208 Acute bronchitis due to other specified organisms: Secondary | ICD-10-CM | POA: Diagnosis not present

## 2023-05-14 MED ORDER — AMOXICILLIN-POT CLAVULANATE 875-125 MG PO TABS: 1.0000 | ORAL_TABLET | Freq: Two times a day (BID) | ORAL | 0 refills | Status: AC

## 2023-05-14 MED ORDER — PROMETHAZINE-DM 6.25-15 MG/5ML PO SYRP: 5.0000 mL | ORAL_SOLUTION | Freq: Four times a day (QID) | ORAL | 0 refills | Status: AC | PRN

## 2023-05-14 NOTE — Progress Notes (Signed)
Date:  05/14/2023   Name:  [name]   DOB:  [birth year]   MRN:  [id number]   Chief Complaint: Cough (Started 1 week ago. Cough- green mucous, congestion in face and chest, and wheezing.)  Cough This is a new problem. The current episode started in the past 7 days. The problem has been gradually worsening. The problem occurs every few minutes. The cough is Productive of sputum. Associated symptoms include nasal congestion, postnasal drip, shortness of breath and wheezing. Pertinent negatives include no chest pain, chills, ear congestion, fever or headaches. Nothing aggravates the symptoms. He has tried a beta-agonist inhaler for the symptoms. The treatment provided moderate relief. RA, pulmonary fibrosis    Review of Systems  Constitutional:  Negative for chills and fever.  HENT:  Positive for postnasal drip. Negative for trouble swallowing.   Respiratory:  Positive for cough, shortness of breath and wheezing.   Cardiovascular:  Negative for chest pain.  Gastrointestinal:  Negative for diarrhea and nausea.  Neurological:  Negative for dizziness, light-headedness and headaches.  Psychiatric/Behavioral:  Negative for dysphoric mood and sleep disturbance. The patient is not nervous/anxious.      Lab Results  Component Value Date   NA 144 02/26/2023   K 3.9 02/26/2023   CO2 23 02/26/2023   GLUCOSE 90 02/26/2023   BUN 17 02/26/2023   CREATININE 1.02 02/26/2023   CALCIUM 9.1 02/26/2023   EGFR 83 02/26/2023   GFRNONAA >60 01/09/2023   Lab Results  Component Value Date   CHOL 210 (H) 02/26/2023   HDL 46 02/26/2023   LDLCALC 127 (H) 02/26/2023   TRIG 210 (H) 02/26/2023   CHOLHDL 4.6 02/26/2023   Lab Results  Component Value Date   TSH 1.470 02/26/2023   Lab Results  Component Value Date   HGBA1C 5.8 (H) 02/26/2023   Lab Results  Component Value Date   WBC 9.0 02/26/2023   HGB 13.3 02/26/2023   HCT 42.9 02/26/2023   MCV 88 02/26/2023   PLT 182 02/26/2023    Lab Results  Component Value Date   ALT 23 02/26/2023   AST 17 02/26/2023   ALKPHOS 59 02/26/2023   BILITOT <0.2 02/26/2023   Lab Results  Component Value Date   VD25OH 74.6 02/26/2023     Patient Active Problem List   Diagnosis Date Noted   Not currently working due to disabled status 08/29/2022   BPH with obstruction/lower urinary tract symptoms 08/29/2022   Chronic sinusitis 08/29/2022   Mixed hyperlipidemia 08/29/2022   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   Immunosuppressed status (HCC) 03/21/2022   Prediabetes 03/21/2022   Idiopathic chronic gout of multiple sites without tophus 09/13/2021   Systemic lupus erythematosus (SLE) in adult (HCC) 07/23/2017   Vitamin D deficiency 07/23/2017   Polyp of sigmoid colon    Carpal tunnel syndrome 08/08/2016   Degenerative disc disease 08/08/2016   Sarcoidosis 04/19/2016   Permanent atrial fibrillation (HCC) 03/21/2016   Hernia, hiatal    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/2015   Typical atrial flutter (HCC)    Tachycardia-induced cardiomyopathy (HCC)    Essential hypertension    Pulmonary fibrosis (HCC)    Nontoxic uninodular goiter 04/20/2014   Mixed connective tissue disease (HCC) 02/03/2014    Allergies  Allergen Reactions   Doxycycline Palpitations   Allopurinol Diarrhea   Probenecid Nausea Only and Other (See Comments)    Other reaction(s): Headache  Other Reaction(s): Other (see comments)    Past Surgical History:  Procedure Laterality  Date   CARDIAC CATHETERIZATION N/A 12/03/2014   Procedure: Left Heart Cath and Coronary Angiography;  Surgeon: Antonieta Iba, MD;  Location: [facility] INVASIVE CV LAB;  Service: Cardiovascular;  Laterality: N/A;   COLONOSCOPY WITH PROPOFOL N/A 11/23/2015   Procedure: COLONOSCOPY WITH PROPOFOL;  Surgeon: Midge Minium, MD;  Location: [facility] ENDOSCOPY;  Service: Endoscopy;  Laterality: N/A;   COLONOSCOPY WITH PROPOFOL N/A 05/04/2017   Procedure: COLONOSCOPY WITH PROPOFOL;  Surgeon: Midge Minium, MD;  Location: [facility];  Service: Endoscopy;  Laterality: N/A;   ESOPHAGOGASTRODUODENOSCOPY (EGD) WITH PROPOFOL N/A 11/23/2015   Procedure: ESOPHAGOGASTRODUODENOSCOPY (EGD) WITH PROPOFOL;  Surgeon: Midge Minium, MD;  Location: [facility] ENDOSCOPY;  Service: Endoscopy;  Laterality: N/A;   HAND SURGERY Left    PERIPHERAL VASCULAR CATHETERIZATION N/A 11/17/2015   Procedure: Upper Extremity Angiography;  Surgeon: Iran Ouch, MD;  Location: [facility]VAS[facility] CV LAB;  Service: Cardiovascular;  Laterality: N/A;   POLYPECTOMY  05/04/2017   Procedure: POLYPECTOMY;  Surgeon: Midge Minium, MD;  Location: [facility];  Service: Endoscopy;;    Social History   Tobacco Use   Smoking status: Former    Current packs/day: 0.00    Average packs/day: 1 pack/day for 20.0 years (20.0 ttl pk-yrs)    Types: Cigarettes    Start date: 08/21/1978    Quit date: 08/21/1998    Years since quitting: 24.7    Passive exposure: Past   Smokeless tobacco: Never  Vaping Use   Vaping status: Never Used  Substance Use Topics   Alcohol use: No   Drug use: No     Medication list has been reviewed and updated.  Current Meds  Medication Sig   albuterol (VENTOLIN HFA) 108 (90 Base) MCG/ACT inhaler Inhale into the lungs as needed.   amoxicillin-clavulanate (AUGMENTIN) 875-125 MG tablet Take 1 tablet by mouth 2 (two) times daily for 10 days.   atorvastatin (LIPITOR) 40 MG tablet Take 0.5 tablets (20 mg total) by mouth daily.   azaTHIOprine (IMURAN) 50 MG tablet Take 50 mg by mouth daily.   BENLYSTA 200 MG/ML SOAJ Inject 200 mg into the skin once a week.   colchicine 0.6 MG tablet Take 0.6 mg by mouth daily.   [employer] NASAL ALLERGY SPRAY 55 MCG/ACT AERO nasal inhaler SMARTSIG:2 Spray(s) Both Nares Daily   diltiazem (CARDIZEM CD) 120 MG 24 hr capsule TAKE 1 CAPSULE TWICE DAILY   enalapril (VASOTEC) 20 MG tablet Take 1 tablet (20 mg total) by mouth 2 (two) times daily.   febuxostat (ULORIC) 40 MG tablet  Take 40 mg by mouth daily.   fluticasone (FLONASE) 50 MCG/ACT nasal spray Place into the nose as needed.   fluticasone-salmeterol (ADVAIR) 250-50 MCG/ACT AEPB Inhale into the lungs daily.   hydrocortisone 2.5 % cream Apply 1 Application topically 2 (two) times daily.   hydroxychloroquine (PLAQUENIL) 200 MG tablet Take 1 tablet by mouth 2 (two) times daily.   ketoconazole (NIZORAL) 2 % shampoo Apply topically 3 (three) times a week.   metoprolol tartrate (LOPRESSOR) 100 MG tablet TAKE 1 TABLET TWICE DAILY   Multiple Vitamin (MULTI-VITAMINS) TABS Take by mouth.   mycophenolate (CELLCEPT) 500 MG tablet Take 2 tablets by mouth 2 (two) times daily.   pantoprazole (PROTONIX) 40 MG tablet Take 40 mg by mouth 2 (two) times daily.   potassium chloride SA (KLOR-CON M) 20 MEQ tablet TAKE 1 TABLET EVERY DAY   predniSONE (DELTASONE) 5 MG tablet Take 10 mg by mouth daily with breakfast.  promethazine-dextromethorphan (PROMETHAZINE-DM) 6.25-15 MG/5ML syrup Take 5 mLs by mouth 4 (four) times daily as needed for up to 9 days for cough.   sildenafil (VIAGRA) 50 MG tablet Take 1-2 tablets (50-100 mg total) by mouth daily as needed for erectile dysfunction.   tamsulosin (FLOMAX) 0.4 MG CAPS capsule Take 1 capsule (0.4 mg total) by mouth daily.   torsemide (DEMADEX) 20 MG tablet TAKE 1/2 TABLET EVERY DAY   warfarin (COUMADIN) 5 MG tablet Take 1 tablet (5 mg total) by mouth daily. Or as directed.       05/14/2023   10:33 AM 02/26/2023    8:11 AM 02/12/2023    9:10 AM 08/29/2022    2:44 PM  GAD 7 : Generalized Anxiety Score  Nervous, Anxious, on Edge 0 0 0 1  Control/stop worrying 0 0 0 0  Worry too much - different things 0 0 0 1  Trouble relaxing 0 1 0 0  Restless 0 0 0 0  Easily annoyed or irritable 0 0 0 0  Afraid - awful might happen 0 1 0 0  Total GAD 7 Score 0 2 0 2  Anxiety Difficulty Not difficult at all Not difficult at all Not difficult at all Not difficult at all       05/14/2023   10:32 AM  02/26/2023    8:11 AM 02/12/2023    9:10 AM  Depression screen PHQ 2/9  Decreased Interest 2 0 1  Down, Depressed, Hopeless 0 0 1  PHQ - 2 Score 2 0 2  Altered sleeping 1 0 0  Tired, decreased energy 1 0 0  Change in appetite 0 0 0  Feeling bad or failure about yourself  0 0 0  Trouble concentrating 0 0 0  Moving slowly or fidgety/restless 0 0 0  Suicidal thoughts 0 0 0  PHQ-9 Score 4 0 2  Difficult doing work/chores Not difficult at all Not difficult at all Not difficult at all    BP Readings from Last 3 Encounters:  05/14/23 126/74  03/06/23 (!) 140/84  02/26/23 128/74    Physical Exam Vitals and nursing note reviewed.  Constitutional:      General: He is not in acute distress.    Appearance: He is well-developed. He is not ill-appearing.  HENT:     Head: Normocephalic and atraumatic.  Cardiovascular:     Rate and Rhythm: Normal rate and regular rhythm.  Pulmonary:     Effort: Pulmonary effort is normal. No respiratory distress.     Breath sounds: Decreased air movement present.     Comments: Fine crackles throughout with minimal expiratory wheezes Musculoskeletal:     Cervical back: Normal range of motion.  Lymphadenopathy:     Cervical: No cervical adenopathy.  Skin:    General: Skin is warm and dry.     Findings: No rash.  Neurological:     Mental Status: He is alert and oriented to person, place, and time.  Psychiatric:        Mood and Affect: Mood normal.        Behavior: Behavior normal.     Wt Readings from Last 3 Encounters:  05/14/23 251 lb (113.9 kg)  03/06/23 241 lb (109.3 kg)  02/26/23 244 lb (110.7 kg)    BP 126/74   Pulse 78   Temp 97.9 F (36.6 C) (Oral)   Ht 5\' 10"  (1.778 m)   Wt 251 lb (113.9 kg)   SpO2 94%   BMI 36.01  kg/m   Assessment and Plan:  Problem List Items Addressed This Visit       Unprioritized   Pulmonary fibrosis (HCC)   Stable symptoms on Advair and albuterol until recent URI      Immunosuppressed status  (HCC)   Other Visit Diagnoses       Acute bacterial bronchitis    -  Primary   continue albuterol MDI as needed along with O2 follow up if no improvement in symptoms   Relevant Medications   promethazine-dextromethorphan (PROMETHAZINE-DM) 6.25-15 MG/5ML syrup   amoxicillin-clavulanate (AUGMENTIN) 875-125 MG tablet       No follow-ups on file.    Reubin Milan, MD [facility] [place]

## 2023-05-14 NOTE — Telephone Encounter (Signed)
Requested by interface surescripts. Medication dose changed and discontinued 11/29/22.  Requested Prescriptions  Refused Prescriptions Disp Refills   atorvastatin (LIPITOR) 20 MG tablet [Pharmacy Med Name: ATORVASTATIN 20 MG TABLET] 90 tablet 1    Sig: TAKE 1 TABLET BY MOUTH EVERY DAY     Cardiovascular:  Antilipid - Statins Failed - 05/14/2023  9:04 AM      Failed - Lipid Panel in normal range within the last 12 months    Cholesterol, Total  Date Value Ref Range Status  02/26/2023 210 (H) 100 - 199 mg/dL Final   Cholesterol  Date Value Ref Range Status  08/25/2014 88 mg/dL Final    Comment:    1-610 NOTE: New Reference Range  08/04/14    Ldl Cholesterol, Calc  Date Value Ref Range Status  08/25/2014 39 mg/dL Final    Comment:    9-60 NOTE: New Reference Range:  08/04/14    LDL Chol Calc (NIH)  Date Value Ref Range Status  02/26/2023 127 (H) 0 - 99 mg/dL Final   HDL Cholesterol  Date Value Ref Range Status  08/25/2014 24 (L) mg/dL Final    Comment:    [id number] NOTE: New Reference Range:  08/04/14    HDL  Date Value Ref Range Status  02/26/2023 46 >39 mg/dL Final   Triglycerides  Date Value Ref Range Status  02/26/2023 210 (H) 0 - 149 mg/dL Final  11/91/4782 956 mg/dL Final    Comment:    2-130 NOTE: New Reference Range  08/04/14          Passed - Patient is not pregnant      Passed - Valid encounter within last 12 months    Recent Outpatient Visits           2 months ago Annual physical exam   [place] [facility] at [facility], Nyoka Cowden, MD   3 months ago Bronchitis due to COVID-19 virus   [facility] at [facility], Nyoka Cowden, MD   8 months ago Chronic maxillary sinusitis   [facility] at [facility], Nyoka Cowden, MD   6 years ago Trigeminal nerve disorder   [facility] at [facility], [facility]   7 years ago Pulmonary fibrosis [facility])   [place] [facility] at [facility], [facility]       Future Appointments             Today Reubin Milan, MD [facility] at [facility], [facility]   In 7 months Richardo Hanks, Laurette Schimke, MD [facility] [place]

## 2023-05-14 NOTE — Assessment & Plan Note (Signed)
Stable symptoms on Advair and albuterol until recent URI

## 2023-05-21 ENCOUNTER — Ambulatory Visit: Payer: Self-pay

## 2023-05-21 NOTE — Telephone Encounter (Signed)
Reason for Disposition  [1] Taking antibiotic > 7 days AND [2] nasal discharge not improved    Still with chest congestion too  Answer Assessment - Initial Assessment Questions 1. ANTIBIOTIC: "What antibiotic are you taking?" "How many times a day?"     Amoxicillin was prescribed during office visit on 05/14/2023 with Dr. Judithann Graves.     I'm still have a lot of head congestion and chest congestion.    2. ONSET: "When was the antibiotic started?"     He completed the 7 day course. He took all the Amoxicillin.    I'm using saline nasal spray.   It's helping a lot.    I have some chest congestion.  I'm coughing up green mucus.    That's why I felt I needed another round of Amoxicillin to help the chest congestion.   I'm on Coumadin too.  3. PAIN: "How bad is the sinus pain?"   (Scale 1-10; mild, moderate or severe)   - MILD (1-3): doesn't interfere with normal activities    - MODERATE (4-7): interferes with normal activities (e.g., work or school) or awakens from sleep   - SEVERE (8-10): excruciating pain and patient unable to do any normal activities        Moderate 4. FEVER: "Do you have a fever?" If Yes, ask: "What is it, how was it measured, and when did it start?"      No 5. SYMPTOMS: "Are there any other symptoms you're concerned about?" If Yes, ask: "When did it start?"     Coughing up green mucus.   Still having sinus congestion too 6. PREGNANCY: "Is there any chance you are pregnant?" "When was your last menstrual period?"     N/A  Protocols used: Sinus Infection on Antibiotic Follow-up Call-A-AH  Chief Complaint: Saw Dr. Judithann Graves on 05/14/2023 for congestion.   Started and completed a 7 day course of Amoxicillin.   He is still coughing up green mucus and is requesting another round of Amoxicillin. Symptoms: Nasal and had congestion Frequency: Completed Amoxicillin 7 day course. Pertinent Negatives: Patient denies being much better Disposition: [] ED /[] Urgent Care (no appt  availability in office) / [] Appointment(In office/virtual)/ []  [place] [facility]/ [] Home Care/ [] Refused Recommended Disposition /[] [place] [facility]/ [x]  Follow-up with PCP Additional Notes: Message sent to Dr. Judithann Graves.

## 2023-05-21 NOTE — Telephone Encounter (Signed)
Summary: Rx requested - sx not improving   Pt was seen last week and prescribed amoxicillin 7 day course. Pt has completed course but sx not improving. Pt still has congestion in head and chest. Pt requesting another course of amoxicillin to help with sx.     Left message to call back about symptoms.

## 2023-05-21 NOTE — Telephone Encounter (Signed)
Patient informed on VM.[name]  -- [employer]

## 2023-05-28 ENCOUNTER — Telehealth (INDEPENDENT_AMBULATORY_CARE_PROVIDER_SITE_OTHER): Payer: Medicare HMO | Admitting: Internal Medicine

## 2023-05-28 ENCOUNTER — Ambulatory Visit: Payer: Self-pay

## 2023-05-28 ENCOUNTER — Encounter: Payer: Self-pay | Admitting: Internal Medicine

## 2023-05-28 VITALS — Ht 70.0 in | Wt 251.0 lb

## 2023-05-28 DIAGNOSIS — B9789 Other viral agents as the cause of diseases classified elsewhere: Secondary | ICD-10-CM | POA: Diagnosis not present

## 2023-05-28 DIAGNOSIS — J8410 Pulmonary fibrosis, unspecified: Secondary | ICD-10-CM

## 2023-05-28 DIAGNOSIS — J320 Chronic maxillary sinusitis: Secondary | ICD-10-CM

## 2023-05-28 MED ORDER — AMOXICILLIN-POT CLAVULANATE 875-125 MG PO TABS: 1.0000 | ORAL_TABLET | Freq: Two times a day (BID) | ORAL | 0 refills | Status: AC

## 2023-05-28 MED ORDER — PREDNISONE 20 MG PO TABS: 40.0000 mg | ORAL_TABLET | Freq: Every day | ORAL | 0 refills | Status: AC

## 2023-05-28 MED ORDER — PROMETHAZINE-DM 6.25-15 MG/5ML PO SYRP: 5.0000 mL | ORAL_SOLUTION | Freq: Four times a day (QID) | ORAL | 0 refills | Status: AC | PRN

## 2023-05-28 NOTE — Progress Notes (Signed)
Date:  05/28/2023   Name:  [name]   DOB:  02/28/1961   MRN:  [id number]  This encounter was conducted via video encounter. This platform was deemed appropriate for the issues to be addressed.  The patient was correctly identified.  I advised that I am conducting the visit from a secure room in my office at [facility] clinic.  The patient is located at home. The limitations of this form of encounter were discussed with the patient and he/she agreed to proceed.  Some vital signs will be absent.  Chief Complaint: Cough (Cough, with wheezing. Green mucous. Patient said he got a little better but not all the way. No fever. Slight SOB but not as bad as what it was. ) Seen on 05/14/23 for bronchitis and prescribed Augmentin.  Cough This is a recurrent problem. The problem has been gradually improving. The problem occurs hourly. The cough is Productive of sputum. Associated symptoms include chills, headaches, shortness of breath and wheezing. Pertinent negatives include no chest pain, fever, nasal congestion or sore throat. The symptoms are aggravated by exercise and lying down.    Review of Systems  Constitutional:  Positive for chills and fatigue. Negative for fever.  HENT:  Positive for sinus pressure. Negative for sore throat and trouble swallowing.   Respiratory:  Positive for cough, chest tightness, shortness of breath and wheezing.   Cardiovascular:  Negative for chest pain and palpitations.  Neurological:  Positive for headaches.     Lab Results  Component Value Date   NA 144 02/26/2023   K 3.9 02/26/2023   CO2 23 02/26/2023   GLUCOSE 90 02/26/2023   BUN 17 02/26/2023   CREATININE 1.02 02/26/2023   CALCIUM 9.1 02/26/2023   EGFR 83 02/26/2023   GFRNONAA >60 01/09/2023   Lab Results  Component Value Date   CHOL 210 (H) 02/26/2023   HDL 46 02/26/2023   LDLCALC 127 (H) 02/26/2023   TRIG 210 (H) 02/26/2023   CHOLHDL 4.6 02/26/2023   Lab Results  Component  Value Date   TSH 1.470 02/26/2023   Lab Results  Component Value Date   HGBA1C 5.8 (H) 02/26/2023   Lab Results  Component Value Date   WBC 9.0 02/26/2023   HGB 13.3 02/26/2023   HCT 42.9 02/26/2023   MCV 88 02/26/2023   PLT 182 02/26/2023   Lab Results  Component Value Date   ALT 23 02/26/2023   AST 17 02/26/2023   ALKPHOS 59 02/26/2023   BILITOT <0.2 02/26/2023   Lab Results  Component Value Date   VD25OH 74.6 02/26/2023     Patient Active Problem List   Diagnosis Date Noted   Not currently working due to disabled status 08/29/2022   BPH with obstruction/lower urinary tract symptoms 08/29/2022   Chronic sinusitis 08/29/2022   Mixed hyperlipidemia 08/29/2022   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   Immunosuppressed status (HCC) 03/21/2022   Prediabetes 03/21/2022   Idiopathic chronic gout of multiple sites without tophus 09/13/2021   Systemic lupus erythematosus (SLE) in adult [facility]) 07/23/2017   Vitamin D deficiency 07/23/2017   Polyp of sigmoid colon    Carpal tunnel syndrome 08/08/2016   Degenerative disc disease 08/08/2016   Sarcoidosis 04/19/2016   Permanent atrial fibrillation (HCC) 03/21/2016   Hernia, hiatal    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/2015   Typical atrial flutter (HCC)    Tachycardia-induced cardiomyopathy (HCC)    Essential hypertension    Pulmonary fibrosis (HCC)  Nontoxic uninodular goiter 04/20/2014   Mixed connective tissue disease (HCC) 02/03/2014    Allergies  Allergen Reactions   Doxycycline Palpitations   Allopurinol Diarrhea   Probenecid Nausea Only and Other (See Comments)    Other reaction(s): Headache  Other Reaction(s): Other (see comments)    Past Surgical History:  Procedure Laterality Date   CARDIAC CATHETERIZATION N/A 12/03/2014   Procedure: Left Heart Cath and Coronary Angiography;  Surgeon: Antonieta Iba, MD;  Location: [facility] INVASIVE CV LAB;  Service: Cardiovascular;  Laterality: N/A;   COLONOSCOPY WITH PROPOFOL  N/A 11/23/2015   Procedure: COLONOSCOPY WITH PROPOFOL;  Surgeon: Midge Minium, MD;  Location: [facility] ENDOSCOPY;  Service: Endoscopy;  Laterality: N/A;   COLONOSCOPY WITH PROPOFOL N/A 05/04/2017   Procedure: COLONOSCOPY WITH PROPOFOL;  Surgeon: Midge Minium, MD;  Location: [facility];  Service: Endoscopy;  Laterality: N/A;   ESOPHAGOGASTRODUODENOSCOPY (EGD) WITH PROPOFOL N/A 11/23/2015   Procedure: ESOPHAGOGASTRODUODENOSCOPY (EGD) WITH PROPOFOL;  Surgeon: Midge Minium, MD;  Location: [facility] ENDOSCOPY;  Service: Endoscopy;  Laterality: N/A;   HAND SURGERY Left    PERIPHERAL VASCULAR CATHETERIZATION N/A 11/17/2015   Procedure: Upper Extremity Angiography;  Surgeon: Iran Ouch, MD;  Location: [facility] INVASIVE CV LAB;  Service: Cardiovascular;  Laterality: N/A;   POLYPECTOMY  05/04/2017   Procedure: POLYPECTOMY;  Surgeon: Midge Minium, MD;  Location: [facility];  Service: Endoscopy;;    Social History   Tobacco Use   Smoking status: Former    Current packs/day: 0.00    Average packs/day: 1 pack/day for 20.0 years (20.0 ttl pk-yrs)    Types: Cigarettes    Start date: 08/21/1978    Quit date: 08/21/1998    Years since quitting: 24.7    Passive exposure: Past   Smokeless tobacco: Never  Vaping Use   Vaping status: Never Used  Substance Use Topics   Alcohol use: No   Drug use: No     Medication list has been reviewed and updated.  Current Meds  Medication Sig   albuterol (VENTOLIN HFA) 108 (90 Base) MCG/ACT inhaler Inhale into the lungs as needed.   amoxicillin-clavulanate (AUGMENTIN) 875-125 MG tablet Take 1 tablet by mouth 2 (two) times daily for 14 days.   atorvastatin (LIPITOR) 40 MG tablet Take 0.5 tablets (20 mg total) by mouth daily.   azaTHIOprine (IMURAN) 50 MG tablet Take 50 mg by mouth daily.   BENLYSTA 200 MG/ML SOAJ Inject 200 mg into the skin once a week.   colchicine 0.6 MG tablet Take 0.6 mg by mouth daily.   [facility] NASAL ALLERGY SPRAY 55 MCG/ACT AERO nasal  inhaler SMARTSIG:2 Spray(s) Both Nares Daily   diltiazem (CARDIZEM CD) 120 MG 24 hr capsule TAKE 1 CAPSULE TWICE DAILY   enalapril (VASOTEC) 20 MG tablet Take 1 tablet (20 mg total) by mouth 2 (two) times daily.   febuxostat (ULORIC) 40 MG tablet Take 40 mg by mouth daily.   fluticasone (FLONASE) 50 MCG/ACT nasal spray Place into the nose as needed.   fluticasone-salmeterol (ADVAIR) 250-50 MCG/ACT AEPB Inhale into the lungs daily.   hydrocortisone 2.5 % cream Apply 1 Application topically 2 (two) times daily.   hydroxychloroquine (PLAQUENIL) 200 MG tablet Take 1 tablet by mouth 2 (two) times daily.   ketoconazole (NIZORAL) 2 % shampoo Apply topically 3 (three) times a week.   metoprolol tartrate (LOPRESSOR) 100 MG tablet TAKE 1 TABLET TWICE DAILY   Multiple Vitamin (MULTI-VITAMINS) TABS Take by mouth.   mycophenolate (CELLCEPT) 500 MG tablet  Take 2 tablets by mouth 2 (two) times daily.   pantoprazole (PROTONIX) 40 MG tablet Take 40 mg by mouth 2 (two) times daily.   potassium chloride SA (KLOR-CON M) 20 MEQ tablet TAKE 1 TABLET EVERY DAY   predniSONE (DELTASONE) 20 MG tablet Take 2 tablets (40 mg total) by mouth daily with breakfast for 5 days.   predniSONE (DELTASONE) 5 MG tablet Take 10 mg by mouth daily with breakfast.   promethazine-dextromethorphan (PROMETHAZINE-DM) 6.25-15 MG/5ML syrup Take 5 mLs by mouth 4 (four) times daily as needed for up to 9 days for cough.   sildenafil (VIAGRA) 50 MG tablet Take 1-2 tablets (50-100 mg total) by mouth daily as needed for erectile dysfunction.   tamsulosin (FLOMAX) 0.4 MG CAPS capsule Take 1 capsule (0.4 mg total) by mouth daily.   torsemide (DEMADEX) 20 MG tablet TAKE 1/2 TABLET EVERY DAY   warfarin (COUMADIN) 5 MG tablet Take 1 tablet (5 mg total) by mouth daily. Or as directed.       05/28/2023    1:56 PM 05/14/2023   10:33 AM 02/26/2023    8:11 AM 02/12/2023    9:10 AM  GAD 7 : Generalized Anxiety Score  Nervous, Anxious, on Edge 0 0 0 0   Control/stop worrying 0 0 0 0  Worry too much - different things 0 0 0 0  Trouble relaxing 0 0 1 0  Restless 0 0 0 0  Easily annoyed or irritable 0 0 0 0  Afraid - awful might happen 0 0 1 0  Total GAD 7 Score 0 0 2 0  Anxiety Difficulty Not difficult at all Not difficult at all Not difficult at all Not difficult at all       05/28/2023    1:56 PM 05/14/2023   10:32 AM 02/26/2023    8:11 AM  Depression screen PHQ 2/9  Decreased Interest 0 2 0  Down, Depressed, Hopeless 0 0 0  PHQ - 2 Score 0 2 0  Altered sleeping 0 1 0  Tired, decreased energy 0 1 0  Change in appetite 0 0 0  Feeling bad or failure about yourself  0 0 0  Trouble concentrating 0 0 0  Moving slowly or fidgety/restless 0 0 0  Suicidal thoughts 0 0 0  PHQ-9 Score 0 4 0  Difficult doing work/chores Not difficult at all Not difficult at all Not difficult at all    BP Readings from Last 3 Encounters:  05/14/23 126/74  03/06/23 (!) 140/84  02/26/23 128/74    Physical Exam Constitutional:      Appearance: Normal appearance.  Pulmonary:     Effort: Pulmonary effort is normal.  Neurological:     General: No focal deficit present.     Mental Status: He is alert and oriented to person, place, and time.  Psychiatric:        Mood and Affect: Mood normal.        Behavior: Behavior normal.     Wt Readings from Last 3 Encounters:  05/28/23 251 lb (113.9 kg)  05/14/23 251 lb (113.9 kg)  03/06/23 241 lb (109.3 kg)    Ht 5\' 10"  (1.778 m)   Wt 251 lb (113.9 kg)   BMI 36.01 kg/m   Assessment and Plan:  Problem List Items Addressed This Visit       Unprioritized   Pulmonary fibrosis (HCC)   and possible recurrent bronchitis will use Augmentin due to interactions of other antibiotics with Warfarin  Relevant Medications   amoxicillin-clavulanate (AUGMENTIN) 875-125 MG tablet   predniSONE (DELTASONE) 20 MG tablet   Chronic sinusitis - Primary   Recurrent symptoms along with bronchitis Will give  pulse steroids then resume daily dosing      Relevant Medications   amoxicillin-clavulanate (AUGMENTIN) 875-125 MG tablet   predniSONE (DELTASONE) 20 MG tablet   promethazine-dextromethorphan (PROMETHAZINE-DM) 6.25-15 MG/5ML syrup   I spent 8 minutes on this encounter, 100% by video.  No follow-ups on file.    Reubin Milan, MD [facility] [place]

## 2023-05-28 NOTE — Assessment & Plan Note (Signed)
and possible recurrent bronchitis will use Augmentin due to interactions of other antibiotics with Warfarin

## 2023-05-28 NOTE — Telephone Encounter (Signed)
Message from [place] sent at 05/28/2023  8:36 AM EST  Summary: Symptomatic, antibiotic refill request   Pt still has symptoms and is requesting another round of amoxicillin, [id number], [place] - [address] [place] [address] Phone: [phone] Fax: [phone]         Chief Complaint: continued green phlegm wheezing Symptoms: mild to mod SOB Frequency: 05/08/23 Pertinent Negatives: Patient denies fever, chest pain  Disposition: [] ED /[] Urgent Care (no appt availability in office) / [x] Appointment(In office/virtual)/ []  [facility]/ [] Home Care/ [] Refused Recommended Disposition /[] [facility] [place] [facility]/ []  Follow-up with PCP Additional Notes: virtual in office appt  Reason for Disposition  [1] Known COPD or other severe lung disease (i.e., bronchiectasis, cystic fibrosis, lung surgery) AND [2] worsening symptoms (i.e., increased sputum purulence or amount, increased breathing difficulty  Answer Assessment - Initial Assessment Questions 1. ONSET: "When did the cough begin?"      05/08/23 2. SEVERITY: "How bad is the cough today?"    frwq 3. SPUTUM: "Describe the color of your sputum" (none, dry cough; clear, white, yellow, green)     green 4. HEMOPTYSIS: "Are you coughing up any blood?" If so ask: "How much?" (flecks, streaks, tablespoons, etc.)     no 5. DIFFICULTY BREATHING: "Are you having difficulty breathing?" If Yes, ask: "How bad is it?" (e.g., mild, moderate, severe)    - MILD: No SOB at rest, mild SOB with walking, speaks normally in sentences, can lie down, no retractions, pulse < 100.    - MODERATE: SOB at rest, SOB with minimal exertion and prefers to sit, cannot lie down flat, speaks in phrases, mild retractions, audible wheezing, pulse 100-120.    - SEVERE: Very SOB at rest, speaks in single words, struggling to breathe, sitting hunched forward, retractions, pulse > 120      moderate 6.  FEVER: "Do you have a fever?" If Yes, ask: "What is your temperature, how was it measured, and when did it start?"     no 8. LUNG HISTORY: "Do you have any history of lung disease?"  (e.g., pulmonary embolus, asthma, emphysema)     Pulmonary fibrosis 10. OTHER SYMPTOMS: "Do you have any other symptoms?" (e.g., runny nose, wheezing, chest pain)       Head congestion, wheezing  Protocols used: Cough - Acute Productive-A-AH

## 2023-05-28 NOTE — Assessment & Plan Note (Signed)
Recurrent symptoms along with bronchitis Will give pulse steroids then resume daily dosing

## 2023-05-29 ENCOUNTER — Ambulatory Visit: Payer: Medicare HMO | Attending: Cardiovascular Disease

## 2023-05-29 DIAGNOSIS — Z5181 Encounter for therapeutic drug level monitoring: Secondary | ICD-10-CM | POA: Diagnosis not present

## 2023-05-29 DIAGNOSIS — I4892 Unspecified atrial flutter: Secondary | ICD-10-CM

## 2023-05-29 LAB — POCT INR: INR: 3.8 — AB (ref 2.0–3.0)

## 2023-05-29 NOTE — Patient Instructions (Signed)
 HOLD TODAY ONLY THEN Continue on same dosage 1 tablet every day EXCEPT 1/2 TABLET ONLY ON MONDAYS.  Recheck in 3 weeks [facility] [phone] - call if you are put on any antibiotics or steroids

## 2023-06-05 DIAGNOSIS — H53001 Unspecified amblyopia, right eye: Secondary | ICD-10-CM | POA: Diagnosis not present

## 2023-06-05 DIAGNOSIS — H02403 Unspecified ptosis of bilateral eyelids: Secondary | ICD-10-CM | POA: Diagnosis not present

## 2023-06-05 DIAGNOSIS — Z79899 Other long term (current) drug therapy: Secondary | ICD-10-CM | POA: Diagnosis not present

## 2023-06-05 DIAGNOSIS — H35713 Central serous chorioretinopathy, bilateral: Secondary | ICD-10-CM | POA: Diagnosis not present

## 2023-06-14 DIAGNOSIS — H02403 Unspecified ptosis of bilateral eyelids: Secondary | ICD-10-CM | POA: Diagnosis not present

## 2023-06-14 DIAGNOSIS — Z79899 Other long term (current) drug therapy: Secondary | ICD-10-CM | POA: Diagnosis not present

## 2023-06-14 DIAGNOSIS — H35713 Central serous chorioretinopathy, bilateral: Secondary | ICD-10-CM | POA: Diagnosis not present

## 2023-06-14 DIAGNOSIS — H53001 Unspecified amblyopia, right eye: Secondary | ICD-10-CM | POA: Diagnosis not present

## 2023-06-20 ENCOUNTER — Ambulatory Visit: Payer: Medicare HMO

## 2023-06-27 ENCOUNTER — Ambulatory Visit: Payer: Medicare HMO | Attending: Cardiovascular Disease

## 2023-06-27 DIAGNOSIS — I4892 Unspecified atrial flutter: Secondary | ICD-10-CM | POA: Diagnosis not present

## 2023-06-27 DIAGNOSIS — Z5181 Encounter for therapeutic drug level monitoring: Secondary | ICD-10-CM

## 2023-06-27 LAB — POCT INR: INR: 4 — AB (ref 2.0–3.0)

## 2023-06-27 NOTE — Patient Instructions (Signed)
HOLD TODAY ONLY THEN Continue on same dosage 1 tablet every day EXCEPT 1/2 TABLET ONLY ON MONDAYS.  Recheck in 3 weeks. Eat Greens 3 per week [facility] [phone] - call if you are put on any antibiotics or steroids

## 2023-06-28 DIAGNOSIS — N182 Chronic kidney disease, stage 2 (mild): Secondary | ICD-10-CM | POA: Diagnosis not present

## 2023-06-28 DIAGNOSIS — I10 Essential (primary) hypertension: Secondary | ICD-10-CM | POA: Diagnosis not present

## 2023-06-28 DIAGNOSIS — D472 Monoclonal gammopathy: Secondary | ICD-10-CM | POA: Diagnosis not present

## 2023-07-04 ENCOUNTER — Telehealth: Payer: Self-pay | Admitting: Cardiovascular Disease

## 2023-07-04 NOTE — Telephone Encounter (Signed)
 Pt c/o medication issue:  1. Name of Medication: warfarin (COUMADIN ) 5 MG tablet   2. How are you currently taking this medication (dosage and times per day)?    3. Are you having a reaction (difficulty breathing--STAT)? no  4. What is your medication issue? Patient wants to change medication to Eliquis . Please advise

## 2023-07-04 NOTE — Telephone Encounter (Signed)
 Chart reviewed.  Patient on warfarin for A-flutter- no mechanical valve.   Per Dr. Tresia last office, the patient was on warfarin as NOAC was too expensive.    Age: 63 Weight: 113.9 kg Creatinine (06/28/23): 1.03 Patient would qualify for Eliquis  5 mg BID  Will forward to MD to approve dosing.   Insurance on file is [facility] & [employer].  Would need to clarify with the patient what his insurance is.  If filing under [facility]- may still be cost prohibitive until out of pocket cost is met.

## 2023-07-06 NOTE — Telephone Encounter (Signed)
 Called patient and left message for call back.

## 2023-07-10 ENCOUNTER — Encounter: Payer: Self-pay | Admitting: Internal Medicine

## 2023-07-10 ENCOUNTER — Inpatient Hospital Stay: Payer: Medicare HMO | Admitting: Internal Medicine

## 2023-07-10 ENCOUNTER — Inpatient Hospital Stay: Payer: Medicare HMO | Attending: Internal Medicine

## 2023-07-10 DIAGNOSIS — D869 Sarcoidosis, unspecified: Secondary | ICD-10-CM | POA: Diagnosis not present

## 2023-07-10 DIAGNOSIS — Z7901 Long term (current) use of anticoagulants: Secondary | ICD-10-CM | POA: Diagnosis not present

## 2023-07-10 DIAGNOSIS — E876 Hypokalemia: Secondary | ICD-10-CM | POA: Insufficient documentation

## 2023-07-10 DIAGNOSIS — J9610 Chronic respiratory failure, unspecified whether with hypoxia or hypercapnia: Secondary | ICD-10-CM | POA: Insufficient documentation

## 2023-07-10 DIAGNOSIS — J8410 Pulmonary fibrosis, unspecified: Secondary | ICD-10-CM | POA: Diagnosis not present

## 2023-07-10 DIAGNOSIS — D591 Autoimmune hemolytic anemia, unspecified: Secondary | ICD-10-CM | POA: Insufficient documentation

## 2023-07-10 DIAGNOSIS — D594 Other nonautoimmune hemolytic anemias: Secondary | ICD-10-CM

## 2023-07-10 DIAGNOSIS — Z87891 Personal history of nicotine dependence: Secondary | ICD-10-CM | POA: Insufficient documentation

## 2023-07-10 DIAGNOSIS — I4891 Unspecified atrial fibrillation: Secondary | ICD-10-CM | POA: Diagnosis not present

## 2023-07-10 DIAGNOSIS — Z5181 Encounter for therapeutic drug level monitoring: Secondary | ICD-10-CM

## 2023-07-10 DIAGNOSIS — I4892 Unspecified atrial flutter: Secondary | ICD-10-CM

## 2023-07-10 DIAGNOSIS — Z79899 Other long term (current) drug therapy: Secondary | ICD-10-CM | POA: Insufficient documentation

## 2023-07-10 LAB — CBC WITH DIFFERENTIAL/PLATELET
Abs Immature Granulocytes: 0.06 10*3/uL (ref 0.00–0.07)
Basophils Absolute: 0.1 10*3/uL (ref 0.0–0.1)
Basophils Relative: 1 %
Eosinophils Absolute: 0.2 10*3/uL (ref 0.0–0.5)
Eosinophils Relative: 2 %
HCT: 45.2 % (ref 39.0–52.0)
Hemoglobin: 14 g/dL (ref 13.0–17.0)
Immature Granulocytes: 1 %
Lymphocytes Relative: 13 %
Lymphs Abs: 1.2 10*3/uL (ref 0.7–4.0)
MCH: 27 pg (ref 26.0–34.0)
MCHC: 31 g/dL (ref 30.0–36.0)
MCV: 87.1 fL (ref 80.0–100.0)
Monocytes Absolute: 1.1 10*3/uL — ABNORMAL HIGH (ref 0.1–1.0)
Monocytes Relative: 12 %
Neutro Abs: 6.5 10*3/uL (ref 1.7–7.7)
Neutrophils Relative %: 71 %
Platelets: 182 10*3/uL (ref 150–400)
RBC: 5.19 MIL/uL (ref 4.22–5.81)
RDW: 14.7 % (ref 11.5–15.5)
WBC: 9 10*3/uL (ref 4.0–10.5)
nRBC: 0 % (ref 0.0–0.2)

## 2023-07-10 LAB — COMPREHENSIVE METABOLIC PANEL
ALT: 23 U/L (ref 0–44)
AST: 23 U/L (ref 15–41)
Albumin: 3.8 g/dL (ref 3.5–5.0)
Alkaline Phosphatase: 56 U/L (ref 38–126)
Anion gap: 11 (ref 5–15)
BUN: 16 mg/dL (ref 8–23)
CO2: 25 mmol/L (ref 22–32)
Calcium: 9 mg/dL (ref 8.9–10.3)
Chloride: 104 mmol/L (ref 98–111)
Creatinine, Ser: 1.03 mg/dL (ref 0.61–1.24)
GFR, Estimated: >60 mL/min (ref >60)
Glucose, Bld: 93 mg/dL (ref 70–99)
Potassium: 3.3 mmol/L — ABNORMAL LOW (ref 3.5–5.1)
Sodium: 140 mmol/L (ref 135–145)
Total Bilirubin: 0.5 mg/dL (ref 0.0–1.2)
Total Protein: 6.8 g/dL (ref 6.5–8.1)

## 2023-07-10 LAB — FERRITIN: Ferritin: 15 ng/mL — ABNORMAL LOW (ref 24–336)

## 2023-07-10 LAB — IRON AND TIBC
Iron: 79 ug/dL (ref 45–182)
Saturation Ratios: 23 % (ref 17.9–39.5)
TIBC: 344 ug/dL (ref 250–450)
UIBC: 265 ug/dL

## 2023-07-10 LAB — LACTATE DEHYDROGENASE: LDH: 142 U/L (ref 98–192)

## 2023-07-10 MED ORDER — APIXABAN 5 MG PO TABS: 5.0000 mg | ORAL_TABLET | Freq: Two times a day (BID) | ORAL | 3 refills | Status: DC

## 2023-07-10 NOTE — Telephone Encounter (Signed)
Called patient and notified of the following from Dr. Mariah Milling.  We can write prescription for Eliquis 5 twice daily  On switching over would stop warfarin and on day 5 off warfarin would start Eliquis  INR running high recently so need to hold warfarin for 5 days before starting Eliquis  Thx  TGollan   Patient verbalizes understanding. Prescription sent to preferred pharmacy.

## 2023-07-10 NOTE — Progress Notes (Signed)
[facility] OFFICE PROGRESS NOTE  Patient Care Team: Reubin Milan, MD as PCP - General (Internal Medicine) Mariah Milling Tollie Pizza, MD as PCP - Cardiology (Cardiology) Mariah Milling Tollie Pizza, MD as Consulting Physician (Cardiology) Earna Coder, MD as Consulting Physician (Oncology) Patterson Hammersmith, MD as Consulting Physician (Rheumatology) Mertie Moores, MD as Referring Physician (Pulmonary Disease) Jesusita Oka, MD as Referring Physician (Dermatology) Bud Face, MD as Referring Physician (Otolaryngology) Sondra Come, MD as Consulting Physician (Urology)   Oncology History   No history exists.   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  Ambulating independently.  [name] 63 y.o.  male pleasant patient above history of Autoimmune hemolytic anemia- currently on prednisone  /CellCept 500 twice a day [also for Autoimmune/connective tissue disorder]- is here for follow-up.   Patient denies new problems/concerns today.    Patient denies any worsening shortness of breath.  Patient follows up with Dr. Meredeth Ide for pulmonary fibrosis.  Recent CT scan improved.  Chronic mild fatigue.   Review of Systems  Constitutional:  Negative for chills, diaphoresis, fever, malaise/fatigue and weight loss.  HENT:  Negative for nosebleeds and sore throat.   Eyes:  Negative for double vision.  Respiratory:  Negative for cough, hemoptysis, sputum production, shortness of breath and wheezing.   Cardiovascular:  Negative for chest pain, palpitations, orthopnea and leg swelling.  Gastrointestinal:  Negative for  abdominal pain, blood in stool, constipation, diarrhea, heartburn, melena, nausea and vomiting.  Genitourinary:  Negative for dysuria, frequency and urgency.  Musculoskeletal:  Positive for back pain and joint pain.  Skin: Negative.  Negative for itching and rash.  Neurological:  Negative for dizziness, tingling, focal weakness, weakness and headaches.  Endo/Heme/Allergies:  Does not bruise/bleed easily.  Psychiatric/Behavioral:  Negative for depression. The patient is not nervous/anxious and does not have insomnia.      PAST MEDICAL HISTORY :  Past Medical History:  Diagnosis Date   Acquired dilation of ascending aorta and aortic root (HCC)    a. 03/2022 Echo: Asc Ao 40mm.   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 09/02/2014: no sig ischemia, GI uptake noted, no sig WMA, EF 48%, no EKG chanes concerning for ischemia, low risk scan     HTN (hypertension)    Mediastinal adenopathy 11/02/2013   Normal coronary arteries    a. cardiac cath 12/03/2014: no significant CAD, right dominant system, LVEF >55%, no MR or AS   Paroxysmal atrial flutter (HCC)    a. s/p successful TEE/DCCV on 08/28/2014-->was on xarelto, now on warfarin.   PSVT (paroxysmal supraventricular tachycardia) (HCC)    a. 03/2022 Zio: Predominantly sinus rhythm at 64 (37-210).  344 SVT runs (fastest 210 x 6 beat, longest 8 beats), 11.5% PAC burden, 1.7% PVC burden.   Pulmonary fibrosis (HCC)    a. 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 Also on cellcept 500 BID [for connective tissue disorder]- also discussed that this should also help his hemolytic anemia.      # History of A. Fib- on Xare   Tachycardia-induced cardiomyopathy (HCC)    a. 07/2014 Echo: EF 20-25%, global HK; b. TEE 08/28/2014: EF 30-35%, no intracardic thrombus, mildly dilated LA/RA, mild TR, mild-mod aortic sclerosis w/o stenosis; c. 09/2014 Echo: EF 55-60%, select images w/ inf HK; d. 10/2015 Echo: EF 60-65%; e. 03/2022 Echo: EF 55-60%, no rwma, GrI DD, nl RV fxn, AoV sclerosis. asc Ao 40mm.   Vasculitis (HCC) 04/03/2016    PAST SURGICAL HISTORY :   Past Surgical History:  Procedure Laterality Date   CARDIAC CATHETERIZATION N/A 12/03/2014   Procedure: Left Heart Cath and Coronary Angiography;  Surgeon: Antonieta Iba, MD;  Location: [facility] INVASIVE CV [facility];  Service: Cardiovascular;  Laterality: N/A;   COLONOSCOPY WITH PROPOFOL N/A 11/23/2015   Procedure: COLONOSCOPY WITH PROPOFOL;  Surgeon: Midge Minium, MD;  Location: [facility] ENDOSCOPY;  Service: Endoscopy;  Laterality: N/A;   COLONOSCOPY WITH PROPOFOL N/A 05/04/2017   Procedure: COLONOSCOPY WITH PROPOFOL;  Surgeon: Midge Minium, MD;  Location: [facility];  Service: Endoscopy;  Laterality: N/A;   ESOPHAGOGASTRODUODENOSCOPY (EGD) WITH PROPOFOL N/A 11/23/2015   Procedure: ESOPHAGOGASTRODUODENOSCOPY (EGD) WITH PROPOFOL;  Surgeon: Midge Minium, MD;  Location: [facility] ENDOSCOPY;  Service: Endoscopy;  Laterality: N/A;   HAND SURGERY Left    PERIPHERAL VASCULAR CATHETERIZATION N/A 11/17/2015   Procedure: Upper Extremity Angiography;  Surgeon: Iran Ouch, MD;  Location: [facility]VAS[facility] CV LAB;  Service: Cardiovascular;  Laterality: N/A;   POLYPECTOMY  05/04/2017   Procedure: POLYPECTOMY;  Surgeon: Midge Minium, MD;  Location: [facility];  Service: Endoscopy;;    FAMILY HISTORY :   Family History  Problem Relation Age of Onset   Leukemia Mother     SOCIAL HISTORY:   Social History    Tobacco Use   Smoking status: Former    Current packs/day: 0.00    Average packs/day: 1 pack/day for 20.0 years (20.0 ttl pk-yrs)    Types: Cigarettes    Start date: 08/21/1978    Quit date: 08/21/1998    Years since quitting: 24.9    Passive exposure: Past   Smokeless tobacco: Never  Vaping Use   Vaping status: Never Used  Substance Use Topics   Alcohol use: No   Drug use: No    ALLERGIES:  is allergic to doxycycline, allopurinol, and probenecid.  MEDICATIONS:  Current Outpatient Medications  Medication Sig Dispense Refill   albuterol (VENTOLIN HFA) 108 (90 Base) MCG/ACT inhaler Inhale into the lungs as needed.     atorvastatin (LIPITOR) 40 MG tablet Take 0.5 tablets (20 mg total) by mouth daily. 90 tablet 3   azaTHIOprine (IMURAN) 50 MG tablet Take 50 mg by mouth daily.     BENLYSTA 200 MG/ML SOAJ Inject 200 mg into the skin once a week.     colchicine 0.6 MG tablet Take 0.6 mg by mouth daily.     [employer] NASAL ALLERGY SPRAY 55 MCG/ACT AERO nasal inhaler SMARTSIG:2 Spray(s) Both Nares Daily     diltiazem (CARDIZEM CD) 120 MG 24 hr capsule TAKE 1 CAPSULE TWICE DAILY 180 capsule 3   enalapril (VASOTEC) 20 MG tablet  Take 1 tablet (20 mg total) by mouth 2 (two) times daily. 60 tablet 0   febuxostat (ULORIC) 40 MG tablet Take 40 mg by mouth daily.     fluticasone (FLONASE) 50 MCG/ACT nasal spray Place into the nose as needed.     fluticasone-salmeterol (ADVAIR) 250-50 MCG/ACT AEPB Inhale into the lungs daily.     hydrocortisone 2.5 % cream Apply 1 Application topically 2 (two) times daily.     hydroxychloroquine (PLAQUENIL) 200 MG tablet Take 1 tablet by mouth 2 (two) times daily.     ketoconazole (NIZORAL) 2 % shampoo Apply topically 3 (three) times a week.     metoprolol tartrate (LOPRESSOR) 100 MG tablet TAKE 1 TABLET TWICE DAILY 180 tablet 0   Multiple Vitamin (MULTI-VITAMINS) TABS Take by mouth.     mycophenolate (CELLCEPT) 500 MG tablet Take 2 tablets by mouth 2 (two) times  daily.     pantoprazole (PROTONIX) 40 MG tablet Take 40 mg by mouth 2 (two) times daily.     potassium chloride SA (KLOR-CON M) 20 MEQ tablet TAKE 1 TABLET EVERY DAY 90 tablet 0   predniSONE (DELTASONE) 5 MG tablet Take 10 mg by mouth daily with breakfast.     sildenafil (VIAGRA) 50 MG tablet Take 1-2 tablets (50-100 mg total) by mouth daily as needed for erectile dysfunction. 30 tablet 11   tamsulosin (FLOMAX) 0.4 MG CAPS capsule Take 1 capsule (0.4 mg total) by mouth daily. 90 capsule 3   torsemide (DEMADEX) 20 MG tablet TAKE 1/2 TABLET EVERY DAY 45 tablet 0   warfarin (COUMADIN) 5 MG tablet Take 1 tablet (5 mg total) by mouth daily. Or as directed. 90 tablet 1   No current facility-administered medications for this visit.    PHYSICAL EXAMINATION:   BP (!) 166/99 (BP Location: Left Arm, Patient Position: Sitting, Cuff Size: Large)   Pulse 78   Temp (!) 97.5 F (36.4 C) (Tympanic)   Ht 5\' 10"  (1.778 m)   Wt 252 lb 12.8 oz (114.7 kg)   SpO2 97%   BMI 36.27 kg/m   Filed Weights   07/10/23 0957  Weight: 252 lb 12.8 oz (114.7 kg)    Physical Exam HENT:     Head: Normocephalic and atraumatic.     Mouth/Throat:     Pharynx: No oropharyngeal exudate.  Eyes:     Pupils: Pupils are equal, round, and reactive to light.  Cardiovascular:     Rate and Rhythm: Normal rate and regular rhythm.  Pulmonary:     Effort: Pulmonary effort is normal. No respiratory distress.     Breath sounds: Normal breath sounds. No wheezing.  Abdominal:     General: Bowel sounds are normal. There is no distension.     Palpations: Abdomen is soft. There is no mass.     Tenderness: There is no abdominal tenderness. There is no guarding or rebound.  Musculoskeletal:        General: No tenderness. Normal range of motion.     Cervical back: Normal range of motion and neck supple.  Skin:    General: Skin is warm.  Neurological:     Mental Status: He is alert and oriented to person, place, and time.   Psychiatric:        Mood and Affect: Affect normal.     LABORATORY DATA:  I have reviewed the data as listed    Component Value Date/Time   NA 140 07/10/2023 1006   NA 144 02/26/2023 0919  NA 137 08/27/2014 0403   K 3.3 (L) 07/10/2023 1006   K 4.1 08/27/2014 0403   CL 104 07/10/2023 1006   CL 108 08/27/2014 0403   CO2 25 07/10/2023 1006   CO2 26 08/27/2014 0403   GLUCOSE 93 07/10/2023 1006   GLUCOSE 94 08/27/2014 0403   BUN 16 07/10/2023 1006   BUN 17 02/26/2023 0919   BUN 16 08/27/2014 0403   CREATININE 1.03 07/10/2023 1006   CREATININE 1.21 01/09/2023 0948   CREATININE 0.85 08/27/2014 0403   CALCIUM 9.0 07/10/2023 1006   CALCIUM 8.6 (L) 08/27/2014 0403   PROT 6.8 07/10/2023 1006   PROT 6.3 02/26/2023 0919   ALBUMIN 3.8 07/10/2023 1006   ALBUMIN 4.0 02/26/2023 0919   AST 23 07/10/2023 1006   ALT 23 07/10/2023 1006   ALKPHOS 56 07/10/2023 1006   BILITOT 0.5 07/10/2023 1006   BILITOT <0.2 02/26/2023 0919   GFRNONAA >60 07/10/2023 1006   GFRNONAA >60 01/09/2023 0948   GFRNONAA >60 08/27/2014 0403   GFRAA >60 11/25/2019 0958   GFRAA >60 08/27/2014 0403    No results found for: "SPEP", "UPEP"  Lab Results  Component Value Date   WBC 9.0 07/10/2023   NEUTROABS 6.5 07/10/2023   HGB 14.0 07/10/2023   HCT 45.2 07/10/2023   MCV 87.1 07/10/2023   PLT 182 07/10/2023      Chemistry      Component Value Date/Time   NA 140 07/10/2023 1006   NA 144 02/26/2023 0919   NA 137 08/27/2014 0403   K 3.3 (L) 07/10/2023 1006   K 4.1 08/27/2014 0403   CL 104 07/10/2023 1006   CL 108 08/27/2014 0403   CO2 25 07/10/2023 1006   CO2 26 08/27/2014 0403   BUN 16 07/10/2023 1006   BUN 17 02/26/2023 0919   BUN 16 08/27/2014 0403   CREATININE 1.03 07/10/2023 1006   CREATININE 1.21 01/09/2023 0948   CREATININE 0.85 08/27/2014 0403      Component Value Date/Time   CALCIUM 9.0 07/10/2023 1006   CALCIUM 8.6 (L) 08/27/2014 0403   ALKPHOS 56 07/10/2023 1006   AST 23  07/10/2023 1006   ALT 23 07/10/2023 1006   BILITOT 0.5 07/10/2023 1006   BILITOT <0.2 02/26/2023 0919       RADIOGRAPHIC STUDIES: I have personally reviewed the radiological images as listed and agreed with the findings in the report. No results found.   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 JAN 2023- Ferritin- 27; iron sat- 21%-recommend gentle iron. stable  # Mild Hypokalemia/ GFR- 59-monitor for now.stable  # Chronic respiratory failure- pulmonary fibrosis/sarcoidosis[Dr.Fleming]- on Home O2- stable.   #f A. fib on Coumadin- stable  #Since patient is clinically stable I think is reasonable for the patient to follow-up with PCP/Rheumatology can follow-up with [place] as needed.  Patient comfortable with the plan; to call us if any questions or concerns in the interim.  # DISPOSITION: # follow up as needed--Dr.B     No orders of the defined types were placed in this encounter.  All questions were answered. The patient knows to call the clinic with any problems, questions or concerns.      Earna Coder, MD 07/10/2023 10:57 AM

## 2023-07-10 NOTE — Progress Notes (Signed)
Fatigue/weakness: SOMEWHAT Dyspena: NO  Light headedness: NO Blood in stool: NO

## 2023-07-10 NOTE — Assessment & Plan Note (Addendum)
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 JAN 2023- Ferritin- 27; iron sat- 21%-recommend gentle iron. stable  # Mild Hypokalemia/ GFR- 59-monitor for now.stable  # Chronic respiratory failure- pulmonary fibrosis/sarcoidosis[Dr.Fleming]- on Home O2- stable.   #f A. fib on Coumadin- stable  #Since patient is clinically stable I think is reasonable for the patient to follow-up with PCP/Rheumatology can follow-up with [place] as needed.  Patient comfortable with the plan; to call us if any questions or concerns in the interim.  # DISPOSITION: # follow up as needed--Dr.B

## 2023-07-11 ENCOUNTER — Telehealth: Payer: Self-pay | Admitting: Cardiovascular Disease

## 2023-07-11 ENCOUNTER — Telehealth: Payer: Self-pay

## 2023-07-11 NOTE — Telephone Encounter (Signed)
I spoke with patient and pharmacy and explained transition plans from Warfarin to Eliquis.  The pharmacy will send Eliquis today.

## 2023-07-11 NOTE — Telephone Encounter (Signed)
Pt c/o medication issue:  1. Name of Medication:   apixaban (ELIQUIS) 5 MG TABS tablet   2. How are you currently taking this medication (dosage and times per day)?   Not taking yet  3. Are you having a reaction (difficulty breathing--STAT)?   4. What is your medication issue?   Patient stated his pharmacy [facility] - [place], [place] - [address]) is concerned that he has been prescribed Eliquis and is still taking warfarin.  Patient stated the pharmacy wants confirmation patient should get the Eliquis.  Patient provided contact number - [phone].

## 2023-07-18 ENCOUNTER — Ambulatory Visit: Payer: Medicare HMO | Attending: Cardiovascular Disease

## 2023-07-18 ENCOUNTER — Ambulatory Visit: Payer: Medicare HMO

## 2023-07-18 DIAGNOSIS — I4892 Unspecified atrial flutter: Secondary | ICD-10-CM | POA: Diagnosis not present

## 2023-07-18 DIAGNOSIS — Z5181 Encounter for therapeutic drug level monitoring: Secondary | ICD-10-CM | POA: Diagnosis not present

## 2023-07-18 LAB — POCT INR: INR: 4 — AB (ref 2.0–3.0)

## 2023-07-18 NOTE — Patient Instructions (Signed)
Hold tonight and Thursday, START Eliquis on Friday Morning. [facility] [phone] - call if you are put on any antibiotics or steroids

## 2023-07-22 ENCOUNTER — Other Ambulatory Visit: Payer: Self-pay | Admitting: Cardiovascular Disease

## 2023-07-23 NOTE — Progress Notes (Unsigned)
 Cardiology Office Note    Date:  07/24/2023   ID:  [name], DOB [birth year], MRN [id number]  PCP:  Reubin Milan, MD  Cardiologist:  Julien Nordmann, MD  Electrophysiologist:  None   Chief Complaint: Follow-up  History of Present Illness:   [name] is a 63 y.o. male with history of coronary artery calcification noted on CT imaging, atrial flutter now on apixaban with tachycardia mediated cardiomyopathy with subsequent improvement in LV systolic function, PSVT, ascending thoracic aortic aneurysm, chronic hypoxic respiratory failure on supplemental oxygen with pulmonary sarcoidosis and fibrosis, discoid lupus, rheumatoid arthritis, HTN, anemia, and GERD who presents for follow-up of atrial flutter, HFimpEF, and the ascending thoracic aortic aneurysm.   He was initially found to be in atrial flutter in 2016, at which time his EF was 20 to 25%.  Ischemic evaluation was negative.  He subsequently underwent TEE guided DCCV in 08/2014.  TEE at that time showed an EF of 30 to 35% with mild to moderate aortic valve sclerosis without evidence of stenosis or (tricuspid aortic valve).  Outpatient cardiac monitoring showed sinus rhythm with frequent PVCs and no further evidence of atrial flutter.  Following cardioversion, echo in 09/2014 showed an EF of 55 to 60%.   LHC in 11/2014 showed no significant CAD with an EF greater than 55%.  He was admitted to the hospital on 10/2015 with weakness and fall with a hemoglobin of 5.5 requiring multiple units of PRBC.  GI evaluation was unrevealing.  Further evaluation by hematology confirmed hemolytic anemia on prednisone.  Echo from 10/2015 showed an EF of 60 to 65%, no regional wall motion abnormalities, normal LV diastolic function parameters, normal RV systolic function and PASP.  During the study, frequent PACs and PVCs were noted.  Most recent echo from 04/2022 showed an EF of 55 to 60%, no regional wall motion abnormalities, grade 1  diastolic dysfunction, poorly visualized RV with systolic function appearing mildly reduced, mildly enlarged RV cavity size, mildly dilated left atrium, aortic valve sclerosis without evidence of stenosis, and mild dilatation of the ascending aorta measuring 40 mm.  Zio patch at that time showed a predominant rhythm of sinus with an average rate of 64 bpm, 344 episodes of SVT lasting up to 8 beats, frequent PACs with an overall burden of 11.5%, and an occasional PVCs with an overall burden of 1.7%.  In this setting, metoprolol was titrated to 100 mg daily and and follow-up diltiazem was increased to 120 mg twice daily.  He was most recently seen in our office in 11/2022 noting several episodes of heart rates up into the 120s bpm with exertion.  Heart rates improved with rest.  He reported taking additional doses of metoprolol with these episodes.  He also had not been wearing his supplemental oxygen during these episodes.  He reported drinking energy drinks as well with associated elevation in heart rates.   He comes in doing well from a cardiac perspective and is without symptoms of angina or cardiac decompensation.  Palpitation burden is overall well-controlled.  He did note a slight increase in palpitations if adding [place] cream to his coffee.  He has since discontinued this with improvement in palpitation burden.  He has transition from warfarin to apixaban this past week and is tolerating this without adverse event.  No falls, hematochezia, or melena.  No significant lower extremity swelling or progressive orthopnea.  Abstaining from energy drinks.  Overall feels like he is doing well  from a cardiac perspective and does not have any acute concerns.   Labs independently reviewed: 06/2023 - INR 4.0, Hgb 14.0, PLT 182, potassium 3.3, BUN 16, serum creatinine 1.03, albumin 3.8, AST/ALT normal 01/2023 - TSH normal, TC 210, TG 210, HDL 46, LDL 127, A1c 5.8  Past Medical History:  Diagnosis Date    Acquired dilation of ascending aorta and aortic root (HCC)    a. 03/2022 Echo: Asc Ao 40mm.   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 09/02/2014: no sig ischemia, GI uptake noted, no sig WMA, EF 48%, no EKG chanes concerning for ischemia, low risk scan     HTN (hypertension)    Mediastinal adenopathy 11/02/2013   Normal coronary arteries    a. cardiac cath 12/03/2014: no significant CAD, right dominant system, LVEF >55%, no MR or AS   Paroxysmal atrial flutter (HCC)    a. s/p successful TEE/DCCV on 08/28/2014-->was on xarelto, now on warfarin.   PSVT (paroxysmal supraventricular tachycardia) (HCC)    a. 03/2022 Zio: Predominantly sinus rhythm at 64 (37-210).  344 SVT runs (fastest 210 x 6 beat, longest 8 beats), 11.5% PAC burden, 1.7% PVC burden.   Pulmonary fibrosis (HCC)    a. 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 Also on cellcept 500 BID [for connective tissue disorder]- also discussed that this should also help his hemolytic anemia.      # History of A. Fib- on Xare   Tachycardia-induced cardiomyopathy (HCC)    a. 07/2014 Echo: EF 20-25%, global HK; b. TEE 08/28/2014: EF 30-35%, no intracardic thrombus, mildly dilated LA/RA, mild TR, mild-mod aortic sclerosis w/o stenosis; c. 09/2014 Echo: EF 55-60%, select images w/ inf HK; d. 10/2015 Echo: EF 60-65%; e. 03/2022 Echo: EF 55-60%, no rwma, GrI DD, nl RV fxn, AoV sclerosis. asc Ao 40mm.   Vasculitis (HCC) 04/03/2016    Past Surgical  History:  Procedure Laterality Date   CARDIAC CATHETERIZATION N/A 12/03/2014   Procedure: Left Heart Cath and Coronary Angiography;  Surgeon: Antonieta Iba, MD;  Location: [facility];  Service: Cardiovascular;  Laterality: N/A;   COLONOSCOPY WITH PROPOFOL N/A 11/23/2015   Procedure: COLONOSCOPY WITH PROPOFOL;  Surgeon: Midge Minium, MD;  Location: [facility] ENDOS[facility];  Service: Endoscopy;  Laterality: N/A;   COLONOSCOPY WITH PROPOFOL N/A 05/04/2017   Procedure: COLONOSCOPY WITH PROPOFOL;  Surgeon: Midge Minium, MD;  Location: [facility];  Service: Endoscopy;  Laterality: N/A;   ESOPHAGOGASTRODUODENOSCOPY (EGD) WITH PROPOFOL N/A 11/23/2015   Procedure: ESOPHAGOGASTRODUODENOSCOPY (EGD) WITH PROPOFOL;  Surgeon: Midge Minium, MD;  Location: [facility] ENDOSCOPY;  Service: Endoscopy;  Laterality: N/A;   HAND SURGERY Left    PERIPHERAL VASCULAR CATHETERIZATION N/A 11/17/2015   Procedure: Upper Extremity Angiography;  Surgeon: Iran Ouch, MD;  Location: [facility];  Service: Cardiovascular;  Laterality: N/A;   POLYPECTOMY  05/04/2017   Procedure: POLYPECTOMY;  Surgeon: Midge Minium, MD;  Location: [facility];  Service: Endoscopy;;    Current Medications: Current Meds  Medication Sig   albuterol (VENTOLIN HFA) 108 (90 Base) MCG/ACT inhaler Inhale into the lungs as needed.   apixaban (ELIQUIS) 5 MG TABS tablet Take 1 tablet (5 mg total) by mouth 2 (two) times daily.   atorvastatin (LIPITOR) 40 MG tablet Take 0.5 tablets (20 mg total) by mouth daily.   azaTHIOprine (IMURAN) 50 MG tablet Take 50 mg by mouth daily.   BENLYSTA 200 MG/ML SOAJ Inject 200 mg into the skin once a week.   colchicine 0.6 MG tablet Take 0.6 mg by mouth daily.   [employer] NASAL ALLERGY SPRAY 55 MCG/ACT AERO nasal inhaler SMARTSIG:2 Spray(s) Both Nares Daily   diltiazem (CARDIZEM CD) 120 MG 24 hr capsule TAKE 1 CAPSULE TWICE DAILY   enalapril (VASOTEC) 20 MG tablet Take 1 tablet (20 mg total) by mouth  2 (two) times daily.   febuxostat (ULORIC) 40 MG tablet Take 40 mg by mouth daily.   fluticasone (FLONASE) 50 MCG/ACT nasal spray Place into the nose as needed.   fluticasone-salmeterol (ADVAIR) 250-50 MCG/ACT AEPB Inhale into the lungs daily.   hydrocortisone 2.5 % cream Apply 1 Application topically 2 (two) times daily.   hydroxychloroquine (PLAQUENIL) 200 MG tablet Take 1 tablet by mouth 2 (two) times daily.   ketoconazole (NIZORAL) 2 % shampoo Apply topically 3 (three) times a week.   metoprolol tartrate (LOPRESSOR) 100 MG tablet TAKE 1 TABLET TWICE DAILY   Multiple Vitamin (MULTI-VITAMINS) TABS Take by mouth.   mycophenolate (CELLCEPT) 500 MG tablet Take 2 tablets by mouth 2 (two) times daily.   pantoprazole (PROTONIX) 40 MG tablet Take 40 mg by mouth 2 (two) times daily.   potassium chloride SA (KLOR-CON M) 20 MEQ tablet TAKE 1 TABLET EVERY DAY   predniSONE (DELTASONE) 5 MG tablet Take 10 mg by mouth daily with breakfast.   sildenafil (VIAGRA) 50 MG tablet Take 1-2 tablets (50-100 mg total) by mouth daily as needed for erectile dysfunction.   tamsulosin (FLOMAX) 0.4 MG CAPS capsule Take 1 capsule (0.4 mg total) by mouth daily.   torsemide (DEMADEX) 20 MG tablet TAKE 1/2 TABLET EVERY DAY    Allergies:   Doxycycline, Allopurinol, and Probenecid   Social History   Socioeconomic History   Marital status: Married    Spouse name: Not on file   Number of children: Not on file   Years of education: Not on file   Highest education level: Not on file  Occupational History   Not on file  Tobacco Use   Smoking status: Former    Current packs/day: 0.00    Average packs/day: 1 pack/day for 20.0 years (20.0 ttl pk-yrs)    Types: Cigarettes    Start date: 08/21/1978    Quit date: 08/21/1998    Years since quitting: 24.9    Passive exposure: Past   Smokeless tobacco: Never  Vaping Use   Vaping status: Never Used  Substance and Sexual Activity   Alcohol use: No   Drug use: No   Sexual  activity: Yes    Birth control/protection: None  Other Topics Concern   Not on file  Social History Narrative   Not on file   Social Drivers of Health   Financial Resource Strain: Low Risk  (10/26/2022)   Overall Financial Resource Strain (CARDIA)    Difficulty of Paying Living Expenses: Not hard at all  Food Insecurity: No Food Insecurity (10/26/2022)   Hunger Vital Sign  Worried About Programme researcher, broadcasting/film/video in the Last Year: Never true    Ran Out of Food in the Last Year: Never true  Transportation Needs: No Transportation Needs (10/26/2022)   PRAPARE - Administrator, Civil Service (Medical): No    Lack of Transportation (Non-Medical): No  Physical Activity: Insufficiently Active (10/26/2022)   Exercise Vital Sign    Days of Exercise per Week: 2 days    Minutes of Exercise per Session: 20 min  Stress: No Stress Concern Present (10/26/2022)   [employer] of Occupational Health - Occupational Stress Questionnaire    Feeling of Stress : Only a little  Social Connections: Moderately Isolated (10/26/2022)   Social Connection and Isolation Panel [NHANES]    Frequency of Communication with Friends and Family: More than three times a week    Frequency of Social Gatherings with Friends and Family: Twice a week    Attends Religious Services: Never    Database administrator or Organizations: No    Attends Engineer, structural: Never    Marital Status: Married     Family History:  The patient's family history includes Leukemia in his mother.  ROS:   12-point review of systems is negative unless otherwise noted in the HPI.   EKGs/Labs/Other Studies Reviewed:    Studies reviewed were summarized above. The additional studies were reviewed today:  2D echo 05/02/2022: 1. Left ventricular ejection fraction, by estimation, is 55 to 60%. The  left ventricle has normal function. The left ventricle has no regional  wall motion abnormalities. Left ventricular  diastolic parameters are  consistent with Grade I diastolic  dysfunction (impaired relaxation).   2. Right ventricule is not well visualized, grossly systolic function  appears mildly reduced. The right ventricular size is mildly enlarged.  Tricuspid regurgitation signal is inadequate for assessing PA pressure.   3. Left atrial size was mildly dilated.   4. The mitral valve is normal in structure. No evidence of mitral valve  regurgitation. No evidence of mitral stenosis.   5. The aortic valve has an indeterminant number of cusps. There is mild  calcification of the aortic valve. Aortic valve regurgitation is not  visualized. Aortic valve sclerosis/calcification is present, without any  evidence of aortic stenosis. Aortic  valve area, by VTI measures 2.82 cm.   6. There is mild dilatation of the ascending aorta, measuring 40 mm.   7. The inferior vena cava is normal in size with greater than 50%  respiratory variability, suggesting right atrial pressure of 3 mmHg.   Comparison(s): Echo from 10/2015 showed an EF of 60 to 65%, no regional  wall motion abnormalities, normal LV diastolic function parameters, normal  RV systolic function and PASP. During the study, frequent PACs and PVCs  were noted.  __________  Luci Bank patch 03/2022: Normal sinus rhythm Patient had a min HR of 37 bpm, max HR of 210 bpm, and avg HR of 64 bpm.    344 Supraventricular Tachycardia runs occurred, the run with the fastest interval lasting 6 beats with a max rate of 210 bpm, the longest lasting 8 beats with an avg rate of 123 bpm.    Isolated SVEs were frequent (11.5%, 295621), SVE Couplets were occasional (2.6%, 17179), and SVE Triplets were rare (<1.0%, 4282).    Isolated VEs were occasional (1.7%, 22282), VE Couplets were rare (<1.0%, 27), and VE Triplets were rare (<1.0%, 1).  Ventricular Bigeminy and Trigeminy were present.   Patient triggered events (  2), associated with rare PACs __________  2D echo  11/22/2015: - Left ventricle: The cavity size was normal. Systolic function was    normal. The estimated ejection fraction was in the range of 60%    to 65%. Wall motion was normal; there were no regional wall    motion abnormalities. Left ventricular diastolic function    parameters were normal.  - Left atrium: The atrium was at the upper limits of normal in    size.  - Right ventricle: Systolic function was normal.  - Pulmonary arteries: Systolic pressure was within the normal    range.   Impressions:   - Challenging image quality. Frequent APCs and PVCs.  __________   Upper extremity angiography 11/17/2015: 1. 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  2. Beading appearance of left ulnar and radial arteries suggestive of a systemic process (No improvement with NTG). ? Vasculitis.    Recommendations: Although the ulnar artery is occluded distally, the fingers are getting normal flow via the radial artery with an intact hand arch. Thus, there is no benefit of opening the ulnar artery which is very small anyway with what seems to be a systemic process. ( I reviewed the angiogram with Dr. Myra Gianotti for a second opinion) . Continue wound care. Recommend rheumatology consult. __________   Upper extremity arterial duplex 11/04/2015: Summary:  The ulnar artery appears small with atypical waveform. Subclavian,  axillary, brachial, and radial arteries of the left upper extremity  appear patent with normal waveforms. Arterial flow noted in left  palmar arch.  __________   Outpatient cardiac monitoring 11/2014: Shows episodes of PVCs in a bigeminal manner. On 7/20, 7/22, 7/25, 12/27/14 He was asymptomatic.  Otherwise no other significant arrhythmia __________   [facility] 12/03/2014: Right dominant coronary arterial system No significant coronary artery disease Normal LV gram, ejection fraction estimated at greater than 55% No  significant MR, aortic valve stenosis   Etiology of his chest pain is likely atypical in nature Shortness of breath likely secondary to underlying pulmonary fibrosis Medical management recommended __________   2D echo 10/05/2014: - Left ventricle: The cavity size was normal. Systolic function was    normal. The estimated ejection fraction was in the range of 55%    to 60%. Select images suggestive of hypokinesis of the inferior    and posterior myocardium. Left ventricular diastolic function    parameters were normal.  - Left atrium: The atrium was normal in size.  - Right ventricle: Systolic function was normal.  - Pulmonary arteries: Systolic pressure was within the normal    range.    EKG:  EKG is not ordered today.    Recent Labs: 02/26/2023: TSH 1.470 07/10/2023: ALT 23; BUN 16; Creatinine, Ser 1.03; Hemoglobin 14.0; Platelets 182; Potassium 3.3; Sodium 140  Recent Lipid Panel    Component Value Date/Time   CHOL 210 (H) 02/26/2023 0919   CHOL 88 08/25/2014 0110   TRIG 210 (H) 02/26/2023 0919   TRIG 124 08/25/2014 0110   HDL 46 02/26/2023 0919   HDL 24 (L) 08/25/2014 0110   CHOLHDL 4.6 02/26/2023 0919   CHOLHDL 4.4 11/29/2022 1137   VLDL 32 11/29/2022 1137   VLDL 25 08/25/2014 0110   LDLCALC 127 (H) 02/26/2023 0919   LDLCALC 39 08/25/2014 0110    PHYSICAL EXAM:    VS:  BP (!) 140/90 (BP Location: Left Arm, Patient Position: Sitting, Cuff Size:  Normal)   Pulse 73   Ht 5\' 10"  (1.778 m)   Wt 253 lb (114.8 kg)   BMI 36.30 kg/m   BMI: Body mass index is 36.3 kg/m.  Physical Exam Vitals reviewed.  Constitutional:      Appearance: He is well-developed.  HENT:     Head: Normocephalic and atraumatic.  Eyes:     General:        Right eye: No discharge.        Left eye: No discharge.  Cardiovascular:     Rate and Rhythm: Normal rate and regular rhythm.     Heart sounds: Normal heart sounds, S1 normal and S2 normal. Heart sounds not distant. No midsystolic click and  no opening snap. No murmur heard.    No friction rub.  Pulmonary:     Effort: Pulmonary effort is normal. No respiratory distress.     Breath sounds: Normal breath sounds. No decreased breath sounds, wheezing, rhonchi or rales.  Chest:     Chest wall: No tenderness.  Musculoskeletal:     Cervical back: Normal range of motion.     Right lower leg: No edema.     Left lower leg: No edema.  Skin:    General: Skin is warm and dry.     Nails: There is no clubbing.  Neurological:     Mental Status: He is alert and oriented to person, place, and time.  Psychiatric:        Speech: Speech normal.        Behavior: Behavior normal.        Thought Content: Thought content normal.        Judgment: Judgment normal.     Wt Readings from Last 3 Encounters:  07/24/23 253 lb (114.8 kg)  07/10/23 252 lb 12.8 oz (114.7 kg)  05/28/23 251 lb (113.9 kg)     ASSESSMENT & PLAN:   Paroxysmal atrial flutter: He is doing well without significant palpitation.  Maintaining sinus rhythm.  He remains on Cardizem CD 120 mg twice daily along with Lopressor 100 mg twice daily.  CHADS2VASc 3 (CHF, HTN, vascular disease).  Now on apixaban 5 mg twice daily and does not meet reduced dosing criteria.  No falls or symptoms concerning for bleeding.  Recent labs stable.  Nonsustained PSVT: Has not.  Remains on Cardizem CD and Lopressor as outlined above.  HFimpEF: Felt to be tachycardia mediated in the setting of paroxysmal atrial flutter.  Most recent echo with preserved LV systolic function.  He appears euvolemic and well compensated.  Remains on enalapril 20 mg twice daily, Lopressor 100 mg twice daily, and torsemide 10 mg daily defer escalation of pharmacotherapy at this time with preserved LV systolic function and lack of heart failure symptoms.  Ascending thoracic aortic aneurysm: Stable by high-resolution chest CT in 03/2023, measuring 4.1 cm.  He will be due for follow-up imaging in 03/2024.  HTN: Blood pressure  is mildly elevated in the office today, though typically well-controlled.  He remains on pharmacotherapy as outlined above.  Coronary artery calcification/HLD: LDL 127 in 01/2023.  No symptoms suggestive of angina.  Remains on atorvastatin 20 mg daily.  Anticipate updating labs at next visit.  Chronic hypoxic respiratory failure with pulmonary sarcoidosis and fibrosis: Stable.  Followed by pulmonology.    Disposition: F/u with Dr. Mariah Milling or an APP in 6 months.   Medication Adjustments/Labs and Tests Ordered: Current medicines are reviewed at length with the patient today.  Concerns regarding  medicines are outlined above. Medication changes, Labs and Tests ordered today are summarized above and listed in the Patient Instructions accessible in Encounters.   Signed, Eula Listen, PA-C 07/24/2023 5:34 PM     [facility] [place] [address] [place], [place] [address] [phone]

## 2023-07-23 NOTE — Telephone Encounter (Signed)
 Please contact pt for future appointment. Pt due for 6 month f/u.

## 2023-07-23 NOTE — Telephone Encounter (Signed)
Pt scheduled on 2/25

## 2023-07-24 ENCOUNTER — Encounter: Payer: Self-pay | Admitting: Physician Assistant

## 2023-07-24 ENCOUNTER — Ambulatory Visit: Payer: Medicare HMO | Attending: Physician Assistant | Admitting: Physician Assistant

## 2023-07-24 VITALS — BP 140/90 | HR 73 | Ht 70.0 in | Wt 253.0 lb

## 2023-07-24 DIAGNOSIS — I5032 Chronic diastolic (congestive) heart failure: Secondary | ICD-10-CM

## 2023-07-24 DIAGNOSIS — I4892 Unspecified atrial flutter: Secondary | ICD-10-CM | POA: Diagnosis not present

## 2023-07-24 DIAGNOSIS — I7121 Aneurysm of the ascending aorta, without rupture: Secondary | ICD-10-CM | POA: Diagnosis not present

## 2023-07-24 DIAGNOSIS — E785 Hyperlipidemia, unspecified: Secondary | ICD-10-CM

## 2023-07-24 DIAGNOSIS — I471 Supraventricular tachycardia, unspecified: Secondary | ICD-10-CM | POA: Diagnosis not present

## 2023-07-24 DIAGNOSIS — I10 Essential (primary) hypertension: Secondary | ICD-10-CM

## 2023-07-24 DIAGNOSIS — J8410 Pulmonary fibrosis, unspecified: Secondary | ICD-10-CM | POA: Diagnosis not present

## 2023-07-24 DIAGNOSIS — J9611 Chronic respiratory failure with hypoxia: Secondary | ICD-10-CM | POA: Diagnosis not present

## 2023-07-24 DIAGNOSIS — I2510 Atherosclerotic heart disease of native coronary artery without angina pectoris: Secondary | ICD-10-CM | POA: Diagnosis not present

## 2023-07-24 NOTE — Patient Instructions (Signed)
 Medication Instructions:  Your Physician recommend you continue on your current medication as directed.    *If you need a refill on your cardiac medications before your next appointment, please call your pharmacy*   Lab Work: None ordered at this time  If you have labs (blood work) drawn today and your tests are completely normal, you will receive your results only by: MyChart Message (if you have MyChart) OR A paper copy in the mail If you have any lab test that is abnormal or we need to change your treatment, we will call you to review the results.   Follow-Up: At [facility], you and your health needs are our priority.  As part of our continuing mission to provide you with exceptional heart care, we have created designated Provider Care Teams.  These Care Teams include your primary Cardiologist (physician) and Advanced Practice Providers (APPs -  Physician Assistants and Nurse Practitioners) who all work together to provide you with the care you need, when you need it.  We recommend signing up for the patient portal called "MyChart".  Sign up information is provided on this After Visit Summary.  MyChart is used to connect with patients for Virtual Visits (Telemedicine).  Patients are able to view lab/test results, encounter notes, upcoming appointments, etc.  Non-urgent messages can be sent to your provider as well.   To learn more about what you can do with MyChart, go to [email or web address].    Your next appointment:   6 month(s)  Provider:   You may see Julien Nordmann, MD or one of the following Advanced Practice Providers on your designated Care Team:   Eula Listen, [place]

## 2023-07-26 DIAGNOSIS — D869 Sarcoidosis, unspecified: Secondary | ICD-10-CM | POA: Diagnosis not present

## 2023-07-26 DIAGNOSIS — I7121 Aneurysm of the ascending aorta, without rupture: Secondary | ICD-10-CM | POA: Diagnosis not present

## 2023-07-26 DIAGNOSIS — J8410 Pulmonary fibrosis, unspecified: Secondary | ICD-10-CM | POA: Diagnosis not present

## 2023-07-26 DIAGNOSIS — M05729 Rheumatoid arthritis with rheumatoid factor of unspecified elbow without organ or systems involvement: Secondary | ICD-10-CM | POA: Diagnosis not present

## 2023-07-27 ENCOUNTER — Encounter: Payer: Self-pay | Admitting: Internal Medicine

## 2023-07-27 ENCOUNTER — Ambulatory Visit: Payer: Medicare HMO | Admitting: Internal Medicine

## 2023-07-27 ENCOUNTER — Other Ambulatory Visit: Payer: Self-pay | Admitting: Internal Medicine

## 2023-07-27 ENCOUNTER — Telehealth: Payer: Self-pay | Admitting: Internal Medicine

## 2023-07-27 VITALS — BP 136/84 | HR 85 | Temp 98.2°F | Ht 70.0 in | Wt 255.0 lb

## 2023-07-27 DIAGNOSIS — M329 Systemic lupus erythematosus, unspecified: Secondary | ICD-10-CM

## 2023-07-27 DIAGNOSIS — J069 Acute upper respiratory infection, unspecified: Secondary | ICD-10-CM

## 2023-07-27 MED ORDER — CLINDAMYCIN HCL 300 MG PO CAPS: 300.0000 mg | ORAL_CAPSULE | Freq: Three times a day (TID) | ORAL | 0 refills | Status: AC

## 2023-07-27 NOTE — Telephone Encounter (Signed)
 Copied from CRM [id number]. Topic: Clinical - Medical Advice >> Jul 27, 2023 11:38 AM [name] wrote: Patient states that he was seen in the office today by Dr. Judithann Graves for a URI. He said that she asked if he was blowing out any green mucus and at the time he said no. Patient said that he is now blowing green mucus and would like to know if she will send in an antibiotic. Please advise.

## 2023-07-27 NOTE — Progress Notes (Signed)
 Date:  07/27/2023   Name:  [name]   DOB:  [birth year]   MRN:  [id number]   Chief Complaint: Sore Throat (Since yesterday evening, little pain, comes and goes ) and Tachycardia (Patient said his heart rate has been elevated and has felt lightheaded, rest feels fine )  Sore Throat  This is a new problem. The current episode started yesterday. Associated symptoms include congestion. Pertinent negatives include no abdominal pain, coughing, diarrhea, headaches or shortness of breath.    Review of Systems  Constitutional:  Negative for chills, fatigue and unexpected weight change.  HENT:  Positive for congestion, postnasal drip and sore throat. Negative for nosebleeds and sinus pressure.   Eyes:  Negative for visual disturbance.  Respiratory:  Negative for cough, chest tightness, shortness of breath and wheezing.   Cardiovascular:  Negative for chest pain, palpitations and leg swelling.  Gastrointestinal:  Negative for abdominal pain, constipation and diarrhea.  Neurological:  Negative for dizziness, weakness, light-headedness and headaches.  Psychiatric/Behavioral:  Negative for dysphoric mood and sleep disturbance. The patient is not nervous/anxious.      Lab Results  Component Value Date   NA 140 07/10/2023   K 3.3 (L) 07/10/2023   CO2 25 07/10/2023   GLUCOSE 93 07/10/2023   BUN 16 07/10/2023   CREATININE 1.03 07/10/2023   CALCIUM 9.0 07/10/2023   EGFR 83 02/26/2023   GFRNONAA >60 07/10/2023   Lab Results  Component Value Date   CHOL 210 (H) 02/26/2023   HDL 46 02/26/2023   LDLCALC 127 (H) 02/26/2023   TRIG 210 (H) 02/26/2023   CHOLHDL 4.6 02/26/2023   Lab Results  Component Value Date   TSH 1.470 02/26/2023   Lab Results  Component Value Date   HGBA1C 5.8 (H) 02/26/2023   Lab Results  Component Value Date   WBC 9.0 07/10/2023   HGB 14.0 07/10/2023   HCT 45.2 07/10/2023   MCV 87.1 07/10/2023   PLT 182 07/10/2023   Lab Results  Component Value  Date   ALT 23 07/10/2023   AST 23 07/10/2023   ALKPHOS 56 07/10/2023   BILITOT 0.5 07/10/2023   Lab Results  Component Value Date   VD25OH 74.6 02/26/2023     Patient Active Problem List   Diagnosis Date Noted   Not currently working due to disabled status 08/29/2022   BPH with obstruction/lower urinary tract symptoms 08/29/2022   Chronic sinusitis 08/29/2022   Mixed hyperlipidemia 08/29/2022   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   Immunosuppressed status (HCC) 03/21/2022   Prediabetes 03/21/2022   Idiopathic chronic gout of multiple sites without tophus 09/13/2021   Systemic lupus erythematosus (SLE) in adult (HCC) 07/23/2017   Vitamin D deficiency 07/23/2017   Polyp of sigmoid colon    Carpal tunnel syndrome 08/08/2016   Degenerative disc disease 08/08/2016   Sarcoidosis 04/19/2016   Permanent atrial fibrillation (HCC) 03/21/2016   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   Hernia, hiatal    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/2015   Typical atrial flutter (HCC)    Tachycardia-induced cardiomyopathy (HCC)    Essential hypertension    Pulmonary fibrosis (HCC)    Nontoxic uninodular goiter 04/20/2014   Mixed connective tissue disease (HCC) 02/03/2014    Allergies  Allergen Reactions   Doxycycline Palpitations   Allopurinol Diarrhea   Probenecid Nausea Only and Other (See Comments)    Other reaction(s): Headache  Other Reaction(s): Other (see comments)    Past Surgical History:  Procedure Laterality Date  CARDIAC CATHETERIZATION N/A 12/03/2014   Procedure: Left Heart Cath and Coronary Angiography;  Surgeon: Antonieta Iba, MD;  Location: [facility];  Service: Cardiovascular;  Laterality: N/A;   COLONOSCOPY WITH PROPOFOL N/A 11/23/2015   Procedure: COLONOSCOPY WITH PROPOFOL;  Surgeon: Midge Minium, MD;  Location: [facility];  Service: Endoscopy;  Laterality: N/A;   COLONOSCOPY WITH PROPOFOL N/A 05/04/2017   Procedure: COLONOSCOPY WITH PROPOFOL;   Surgeon: Midge Minium, MD;  Location: [facility];  Service: Endoscopy;  Laterality: N/A;   ESOPHAGOGASTRODUODENOSCOPY (EGD) WITH PROPOFOL N/A 11/23/2015   Procedure: ESOPHAGOGASTRODUODENOSCOPY (EGD) WITH PROPOFOL;  Surgeon: Midge Minium, MD;  Location: [facility];  Service: Endoscopy;  Laterality: N/A;   HAND SURGERY Left    PERIPHERAL VASCULAR CATHETERIZATION N/A 11/17/2015   Procedure: Upper Extremity Angiography;  Surgeon: Iran Ouch, MD;  Location: [facility];  Service: Cardiovascular;  Laterality: N/A;   POLYPECTOMY  05/04/2017   Procedure: POLYPECTOMY;  Surgeon: Midge Minium, MD;  Location: [facility];  Service: Endoscopy;;    Social History   Tobacco Use   Smoking status: Former    Current packs/day: 0.00    Average packs/day: 1 pack/day for 20.0 years (20.0 ttl pk-yrs)    Types: Cigarettes    Start date: 08/21/1978    Quit date: 08/21/1998    Years since quitting: 24.9    Passive exposure: Past   Smokeless tobacco: Never  Vaping Use   Vaping status: Never Used  Substance Use Topics   Alcohol use: No   Drug use: No     Medication list has been reviewed and updated.  No outpatient medications have been marked as taking for the 07/27/23 encounter (Office Visit) with Reubin Milan, MD.       07/27/2023   10:20 AM 05/28/2023    1:56 PM 05/14/2023   10:33 AM 02/26/2023    8:11 AM  GAD 7 : Generalized Anxiety Score  Nervous, Anxious, on Edge 0 0 0 0  Control/stop worrying 0 0 0 0  Worry too much - different things  0 0 0  Trouble relaxing  0 0 1  Restless  0 0 0  Easily annoyed or irritable  0 0 0  Afraid - awful might happen  0 0 1  Total GAD 7 Score  0 0 2  Anxiety Difficulty  Not difficult at all Not difficult at all Not difficult at all       07/27/2023   10:20 AM 05/28/2023    1:56 PM 05/14/2023   10:32 AM  Depression screen PHQ 2/9  Decreased Interest 0 0 2  Down, Depressed, Hopeless 0 0 0  PHQ - 2 Score 0 0 2   Altered sleeping  0 1  Tired, decreased energy  0 1  Change in appetite  0 0  Feeling bad or failure about yourself   0 0  Trouble concentrating  0 0  Moving slowly or fidgety/restless  0 0  Suicidal thoughts  0 0  PHQ-9 Score  0 4  Difficult doing work/chores  Not difficult at all Not difficult at all    BP Readings from Last 3 Encounters:  07/27/23 136/84  07/24/23 (!) 140/90  07/10/23 (!) 166/99    Physical Exam Constitutional:      Appearance: He is well-developed.  HENT:     Right Ear: Tympanic membrane normal.     Left Ear: Tympanic membrane normal.     Nose:  Right Sinus: No maxillary sinus tenderness or frontal sinus tenderness.     Left Sinus: No maxillary sinus tenderness or frontal sinus tenderness.     Mouth/Throat:     Mouth: Mucous membranes are moist.     Pharynx: Oropharyngeal exudate (yellow mucoid drainage) present. No posterior oropharyngeal erythema.     Tonsils: No tonsillar exudate.  Cardiovascular:     Rate and Rhythm: Normal rate and regular rhythm.  Pulmonary:     Effort: Pulmonary effort is normal.     Breath sounds: Normal breath sounds. No decreased breath sounds or wheezing.  Neurological:     Mental Status: He is alert.     Wt Readings from Last 3 Encounters:  07/24/23 253 lb (114.8 kg)  07/10/23 252 lb 12.8 oz (114.7 kg)  05/28/23 251 lb (113.9 kg)    BP 136/84   Pulse 85   Temp 98.2 F (36.8 C)   Ht 5\' 10"  (1.778 m)   SpO2 96%   BMI 36.30 kg/m   Assessment and Plan:  Problem List Items Addressed This Visit       Unprioritized   Systemic lupus erythematosus (SLE) in adult [facility])   Under Rheumatology care. On Plaquenil, Imuran, Cellcept and prednisone      Other Visit Diagnoses       URI, acute    -  Primary   Continue Flonase spray; add Claritin Push fluids call if symptoms worsen       Return in about 6 months (around 01/24/2024) for CPX.    Reubin Milan, MD [facility] [place]

## 2023-07-27 NOTE — Assessment & Plan Note (Signed)
 Under Rheumatology care. On Plaquenil, Imuran, Cellcept and prednisone

## 2023-07-27 NOTE — Patient Instructions (Signed)
 Use fluticasone nasal spray daily   Use Claritin 10 mg daily (or generic is Loratadine)

## 2023-07-27 NOTE — Telephone Encounter (Signed)
 Please review.  KP

## 2023-08-16 ENCOUNTER — Telehealth: Payer: Self-pay | Admitting: Cardiovascular Disease

## 2023-08-16 MED ORDER — ENALAPRIL MALEATE 20 MG PO TABS: 20.0000 mg | ORAL_TABLET | Freq: Two times a day (BID) | ORAL | 1 refills | Status: DC

## 2023-08-16 NOTE — Telephone Encounter (Signed)
*  STAT* If patient is at the pharmacy, call can be transferred to refill team.   1. Which medications need to be refilled? (please list name of each medication and dose if known) Enalapril   2. Would you like to learn more about the convenience, safety, & potential cost savings by using the [facility]?     3. Are you open to using the [facility] (Type [facility].   4. Which pharmacy/location (including street and city if local pharmacy) is medication to be sent to? [facility]  [address], [place]   5. Do they need a 30 day or 90 day supply? 90 days and refills- please call in today- patient is out of his medicine

## 2023-09-05 ENCOUNTER — Other Ambulatory Visit: Payer: Self-pay | Admitting: Cardiovascular Disease

## 2023-09-05 NOTE — Telephone Encounter (Signed)
 Refill request

## 2023-09-20 ENCOUNTER — Other Ambulatory Visit: Payer: Self-pay | Admitting: Cardiovascular Disease

## 2023-09-24 NOTE — Telephone Encounter (Signed)
 Prescription refill request for Eliquis  received. Indication: AF Last office visit: 2/25 Scr: 1.03 Age: 63 Weight: 115.7 kg

## 2023-10-04 ENCOUNTER — Other Ambulatory Visit: Payer: Self-pay | Admitting: Cardiovascular Disease

## 2023-10-25 ENCOUNTER — Other Ambulatory Visit: Payer: Self-pay | Admitting: Orthopaedic Surgery

## 2023-10-25 DIAGNOSIS — S62035A Nondisplaced fracture of proximal third of navicular [scaphoid] bone of left wrist, initial encounter for closed fracture: Secondary | ICD-10-CM

## 2023-10-26 ENCOUNTER — Ambulatory Visit
Admission: RE | Admit: 2023-10-26 | Discharge: 2023-10-26 | Disposition: A | Discharge status: Home / Self Care | Source: Ambulatory Visit | Attending: Orthopaedic Surgery | Admitting: Orthopaedic Surgery

## 2023-10-26 DIAGNOSIS — S62035A Nondisplaced fracture of proximal third of navicular [scaphoid] bone of left wrist, initial encounter for closed fracture: Secondary | ICD-10-CM | POA: Insufficient documentation

## 2023-11-01 ENCOUNTER — Ambulatory Visit: Payer: Medicare HMO

## 2023-11-01 VITALS — Ht 70.0 in | Wt 239.0 lb

## 2023-11-01 DIAGNOSIS — Z0000 Encounter for general adult medical examination without abnormal findings: Secondary | ICD-10-CM

## 2023-11-01 NOTE — Progress Notes (Addendum)
 Subjective:   [name] is a 63 y.o. who presents for a Medicare Wellness preventive visit.  As a reminder, Annual Wellness Visits don't include a physical exam, and some assessments may be limited, especially if this visit is performed virtually. We may recommend an in-person follow-up visit with your provider if needed.  Visit Complete: Virtual I connected with  Scharlene Curry on 11/01/23 by a audio enabled telemedicine application and verified that I am speaking with the correct person using two identifiers.  Patient Location: Home  Provider Location: Home Office  I discussed the limitations of evaluation and management by telemedicine. The patient expressed understanding and agreed to proceed.  Vital Signs: Because this visit was a virtual/telehealth visit, some criteria may be missing or patient reported. Any vitals not documented were not able to be obtained and vitals that have been documented are patient reported.    Persons Participating in Visit:   AWV Questionnaire: Patient.No: Patient Medicare AWV questionnaire was not completed prior to this visit.  Cardiac Risk Factors include: advanced age (>50men, >48 women);male gender;hypertension     Objective:     Today's Vitals   11/01/23 0849  Weight: 239 lb (108.4 kg)  Height: 5\' 10"  (1.778 m)   Body mass index is 34.29 kg/m.     11/01/2023    9:01 AM 07/10/2023    9:57 AM 01/09/2023   10:12 AM 10/26/2022    9:05 AM 07/11/2022    1:00 PM 04/08/2022    6:02 PM 12/06/2021   11:06 AM  Advanced Directives  Does Patient Have a Medical Advance Directive? No No No No No No No  Would patient like information on creating a medical advance directive? No - Patient declined No - Patient declined  No - Patient declined No - Patient declined      Current Medications (verified) Outpatient Encounter Medications as of 11/01/2023  Medication Sig   albuterol  (VENTOLIN  HFA) 108 (90 Base) MCG/ACT inhaler Inhale  into the lungs as needed.   apixaban  (ELIQUIS ) 5 MG TABS tablet TAKE 1 TABLET TWICE DAILY   atorvastatin  (LIPITOR) 40 MG tablet Take 0.5 tablets (20 mg total) by mouth daily.   azaTHIOprine (IMURAN) 50 MG tablet Take 50 mg by mouth daily.   BENLYSTA 200 MG/ML SOAJ Inject 200 mg into the skin once a week.   colchicine  0.6 MG tablet Take 0.6 mg by mouth daily.   [employer] NASAL ALLERGY SPRAY 55 MCG/ACT AERO nasal inhaler SMARTSIG:2 Spray(s) Both Nares Daily   diltiazem  (CARDIZEM  CD) 120 MG 24 hr capsule TAKE 1 CAPSULE TWICE DAILY   enalapril  (VASOTEC ) 20 MG tablet Take 1 tablet (20 mg total) by mouth 2 (two) times daily.   febuxostat (ULORIC) 40 MG tablet Take 40 mg by mouth daily.   fluticasone -salmeterol (ADVAIR) 250-50 MCG/ACT AEPB Inhale into the lungs daily.   hydrocortisone 2.5 % cream Apply 1 Application topically 2 (two) times daily.   hydroxychloroquine (PLAQUENIL) 200 MG tablet Take 1 tablet by mouth 2 (two) times daily.   ketoconazole (NIZORAL) 2 % shampoo Apply topically 3 (three) times a week.   metoprolol  tartrate (LOPRESSOR ) 100 MG tablet TAKE 1 TABLET TWICE DAILY   Multiple Vitamin (MULTI-VITAMINS) TABS Take by mouth.   mycophenolate (CELLCEPT) 500 MG tablet Take 2 tablets by mouth 2 (two) times daily.   pantoprazole  (PROTONIX ) 40 MG tablet Take 40 mg by mouth 2 (two) times daily.   potassium chloride  SA (KLOR-CON  M) 20 MEQ tablet TAKE 1  TABLET EVERY DAY   predniSONE  (DELTASONE ) 5 MG tablet Take 10 mg by mouth daily with breakfast.   sildenafil  (VIAGRA ) 50 MG tablet Take 1-2 tablets (50-100 mg total) by mouth daily as needed for erectile dysfunction.   tamsulosin  (FLOMAX ) 0.4 MG CAPS capsule Take 1 capsule (0.4 mg total) by mouth daily.   torsemide  (DEMADEX ) 20 MG tablet TAKE 1/2 TABLET EVERY DAY   No facility-administered encounter medications on file as of 11/01/2023.    Allergies (verified) Doxycycline, Allopurinol, and Probenecid   History: Past Medical History:  Diagnosis  Date   Acquired dilation of ascending aorta and aortic root (HCC)    a. 03/2022 Echo: Asc Ao 40mm.   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 09/02/2014: no sig ischemia, GI uptake noted, no sig WMA, EF 48%, no EKG chanes concerning for ischemia, low risk scan     HTN (hypertension)    Mediastinal adenopathy 11/02/2013   Normal coronary arteries    a. cardiac cath 12/03/2014: no significant CAD, right dominant system, LVEF >55%, no MR or AS   Paroxysmal atrial flutter (HCC)    a. s/p successful TEE/DCCV on 08/28/2014-->was on xarelto , now on warfarin.   PSVT (paroxysmal supraventricular tachycardia) (HCC)    a. 03/2022 Zio: Predominantly sinus rhythm at 64 (37-210).  344 SVT runs (fastest 210 x 6 beat, longest 8 beats), 11.5% PAC burden, 1.7% PVC burden.   Pulmonary fibrosis (HCC)    a. 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 Also on cellcept 500 BID [for connective tissue disorder]- also discussed that this should also help his hemolytic anemia.      # History of A. Fib- on Xare   Tachycardia-induced cardiomyopathy (HCC)    a. 07/2014 Echo: EF 20-25%, global HK; b. TEE 08/28/2014: EF 30-35%, no intracardic thrombus, mildly dilated LA/RA, mild TR, mild-mod aortic sclerosis w/o stenosis; c. 09/2014 Echo: EF 55-60%, select images w/ inf HK; d. 10/2015 Echo: EF 60-65%; e. 03/2022 Echo: EF 55-60%, no rwma, GrI DD, nl RV fxn, AoV sclerosis. asc Ao 40mm.   Vasculitis (HCC) 04/03/2016   Past Surgical  History:  Procedure Laterality Date   CARDIAC CATHETERIZATION N/A 12/03/2014   Procedure: Left Heart Cath and Coronary Angiography;  Surgeon: Devorah Fonder, MD;  Location: [facility];  Service: Cardiovascular;  Laterality: N/A;   COLONOSCOPY WITH PROPOFOL  N/A 11/23/2015   Procedure: COLONOSCOPY WITH PROPOFOL ;  Surgeon: Marnee Sink, MD;  Location: [facility] END[facility];  Service: Endoscopy;  Laterality: N/A;   COLONOSCOPY WITH PROPOFOL  N/A 05/04/2017   Procedure: COLONOSCOPY WITH PROPOFOL ;  Surgeon: Marnee Sink, MD;  Location: [facility];  Service: Endoscopy;  Laterality: N/A;   ESOPHAGOGASTRODUODENOSCOPY (EGD) WITH PROPOFOL  N/A 11/23/2015   Procedure: ESOPHAGOGASTRODUODENOSCOPY (EGD) WITH PROPOFOL ;  Surgeon: Marnee Sink, MD;  Location: [facility] ENDOSCOPY;  Service: Endoscopy;  Laterality: N/A;   HAND SURGERY Left    PERIPHERAL VASCULAR CATHETERIZATION N/A 11/17/2015  Procedure: Upper Extremity Angiography;  Surgeon: Wenona Hamilton, MD;  Location: [facility];  Service: Cardiovascular;  Laterality: N/A;   POLYPECTOMY  05/04/2017   Procedure: POLYPECTOMY;  Surgeon: Marnee Sink, MD;  Location: [facility];  Service: Endoscopy;;   Family History  Problem Relation Age of Onset   Leukemia Mother    Social History   Socioeconomic History   Marital status: Married    Spouse name: Not on file   Number of children: Not on file   Years of education: Not on file   Highest education level: 12th grade  Occupational History   Not on file  Tobacco Use   Smoking status: Former    Current packs/day: 0.00    Average packs/day: 1 pack/day for 20.0 years (20.0 ttl pk-yrs)    Types: Cigarettes    Start date: 08/21/1978    Quit date: 08/21/1998    Years since quitting: 25.2    Passive exposure: Past   Smokeless tobacco: Never  Vaping Use   Vaping status: Never Used  Substance and Sexual Activity   Alcohol use: No   Drug use: No   Sexual activity: Yes    Birth  control/protection: None  Other Topics Concern   Not on file  Social History Narrative   Not on file   Social Drivers of Health   Financial Resource Strain: Low Risk  (11/01/2023)   Overall Financial Resource Strain (CARDIA)    Difficulty of Paying Living Expenses: Not hard at all  Food Insecurity: No Food Insecurity (11/01/2023)   Hunger Vital Sign    Worried About Running Out of Food in the Last Year: Never true    Ran Out of Food in the Last Year: Never true  Transportation Needs: No Transportation Needs (11/01/2023)   PRAPARE - Administrator, Civil Service (Medical): No    Lack of Transportation (Non-Medical): No  Physical Activity: Insufficiently Active (11/01/2023)   Exercise Vital Sign    Days of Exercise per Week: 3 days    Minutes of Exercise per Session: 30 min  Stress: No Stress Concern Present (11/01/2023)   [employer] of Occupational Health - Occupational Stress Questionnaire    Feeling of Stress : Not at all  Social Connections: Moderately Isolated (11/01/2023)   Social Connection and Isolation Panel [NHANES]    Frequency of Communication with Friends and Family: More than three times a week    Frequency of Social Gatherings with Friends and Family: More than three times a week    Attends Religious Services: Never    Database administrator or Organizations: No    Attends Engineer, structural: Never    Marital Status: Married    Tobacco Counseling Counseling given: Not Answered    Clinical Intake:  Pre-visit preparation completed: Yes  Pain : No/denies pain     BMI - recorded: 34.29 Nutritional Status: BMI > 30  Obese Nutritional Risks: None Diabetes: No  Lab Results  Component Value Date   HGBA1C 5.8 (H) 02/26/2023   HGBA1C 5.9 03/26/2022     How often do you need to have someone help you when you read instructions, pamphlets, or other written materials from your doctor or pharmacy?: 1 - Never  Interpreter Needed?:  No  Information entered by :: Farris Hong LPN   Activities of Daily Living      11/01/2023    8:58 AM  In your present state of health, do you have  any difficulty performing the following activities:  Hearing? 0  Vision? 0  Difficulty concentrating or making decisions? 0  Walking or climbing stairs? 0  Dressing or bathing? 0  Doing errands, shopping? 0  Preparing Food and eating ? N  Using the Toilet? N  In the past six months, have you accidently leaked urine? N  Do you have problems with loss of bowel control? N  Managing your Medications? N  Managing your Finances? N  Housekeeping or managing your Housekeeping? N    Patient Care Team: Sheron Dixons, MD as PCP - General (Internal Medicine) Devorah Fonder, MD as PCP - Cardiology (Cardiology) Devorah Fonder, MD as Consulting Physician (Cardiology) Gwyn Leos, MD as Consulting Physician (Oncology) Adolphus Akin, MD as Consulting Physician (Rheumatology) Adelaida Holts, MD as Referring Physician (Pulmonary Disease) Mariane Shire, MD as Referring Physician (Dermatology) Rogers Clayman, MD as Referring Physician (Otolaryngology) Lawerence Pressman, MD as Consulting Physician (Urology)   I have updated your Care Teams any recent Medical Services you may have received from other providers in the past year.     Assessment:    This is a routine wellness examination for Marios.  Hearing/Vision screen Hearing Screening - Comments:: Denies hearing difficulties   Vision Screening - Comments:: Wears rx glasses - up to date with routine eye exams with   [facility]   Goals Addressed               This Visit's Progress     Increase physical activity (pt-stated)        Remain Active.       Depression Screen      11/01/2023    8:57 AM 07/27/2023   10:20 AM 05/28/2023    1:56 PM 05/14/2023   10:32 AM 02/26/2023    8:11 AM 02/12/2023    9:10 AM 10/26/2022    9:03 AM  PHQ 2/9  Scores  PHQ - 2 Score 0 0 0 2 0 2 0  PHQ- 9 Score   0 4 0 2 0    Fall Risk      11/01/2023    8:59 AM 07/27/2023   10:20 AM 05/28/2023    1:56 PM 05/14/2023   10:32 AM 02/26/2023    8:10 AM  Fall Risk   Falls in the past year? 0 0 0 0 1  Number falls in past yr: 0 0 0 0 0  Injury with Fall? 0 0 0 0 1  Risk for fall due to : No Fall Risks No Fall Risks No Fall Risks No Fall Risks Impaired balance/gait;History of fall(s)  Follow up Falls evaluation completed Falls evaluation completed Falls evaluation completed Falls evaluation completed Falls evaluation completed    MEDICARE RISK AT HOME:   Medicare Risk at Home Any stairs in or around the home?: Yes If so, are there any without handrails?: No Home free of loose throw rugs in walkways, pet beds, electrical cords, etc?: Yes Adequate lighting in your home to reduce risk of falls?: Yes Life alert?: No Use of a cane, walker or w/c?: No Grab bars in the bathroom?: Yes Shower chair or bench in shower?: Yes Elevated toilet seat or a handicapped toilet?: Yes  TIMED UP AND GO:  Was the test performed?  No  Cognitive Function: 6CIT completed        11/01/2023    9:01 AM 10/26/2022    9:11 AM  6CIT Screen  What Year? 0  points 0 points  What month? 0 points 0 points  What time? 0 points 0 points  Count back from 20 0 points 0 points  Months in reverse 0 points 0 points  Repeat phrase 0 points 0 points  Total Score 0 points 0 points    Immunizations Immunization History  Administered Date(s) Administered   Influenza, Quadrivalent, Recombinant, Inj, Pf 02/11/2022   Influenza, Seasonal, Injecte, Preservative Fre 02/26/2023   Influenza,inj,Quad PF,6+ Mos 02/03/2020   Influenza-Unspecified 02/25/2021   PFIZER Comirnaty(Gray Top)Covid-19 Tri-Sucrose Vaccine 02/28/2020, 10/16/2020, 02/25/2021   PFIZER(Purple Top)SARS-COV-2 Vaccination 08/08/2019, 09/02/2019, 02/28/2020   PNEUMOCOCCAL CONJUGATE-20 02/11/2022   Pfizer Covid-19  Vaccine Bivalent Booster 34yrs & up 02/12/2021   Pfizer(Comirnaty)Fall Seasonal Vaccine 12 years and older 02/10/2022   Respiratory Syncytial Virus Vaccine,Recomb Aduvanted(Arexvy) 02/11/2022   Tdap 03/17/2016, 02/12/2021    Screening Tests Health Maintenance  Topic Date Due   HIV Screening  Never done   Zoster Vaccines- Shingrix (1 of 2) Never done   COVID-19 Vaccine (8 - 2024-25 season) 01/28/2023   INFLUENZA VACCINE  12/28/2023   Medicare Annual Wellness (AWV)  10/31/2024   Colonoscopy  03/11/2030   DTaP/Tdap/Td (3 - Td or Tdap) 02/13/2031   Pneumococcal Vaccine 76-71 Years old  Completed   Hepatitis C Screening  Completed   HPV VACCINES  Aged Out   Meningococcal B Vaccine  Aged Out    Health Maintenance  Health Maintenance Due  Topic Date Due   HIV Screening  Never done   Zoster Vaccines- Shingrix (1 of 2) Never done   COVID-19 Vaccine (8 - 2024-25 season) 01/28/2023   Health Maintenance Items Addressed:   Additional Screening:  Vision Screening: Recommended annual ophthalmology exams for early detection of glaucoma and other disorders of the eye. Would you like a referral to an eye doctor? No    Dental Screening: Recommended annual dental exams for proper oral hygiene  Community Resource Referral / Chronic Care Management: CRR required this visit?  No   CCM required this visit?  No   Plan:    I have personally reviewed and noted the following in the patient's chart:   Medical and social history Use of alcohol, tobacco or illicit drugs  Current medications and supplements including opioid prescriptions. Patient is not currently taking opioid prescriptions. Functional ability and status Nutritional status Physical activity Advanced directives List of other physicians Hospitalizations, surgeries, and ER visits in previous 12 months Vitals Screenings to include cognitive, depression, and falls Referrals and appointments  In addition, I have reviewed  and discussed with patient certain preventive protocols, quality metrics, and best practice recommendations. A written personalized care plan for preventive services as well as general preventive health recommendations were provided to patient.   Dewayne Ford, LPN   06/03/1094   After Visit Summary: (MyChart) Due to this being a telephonic visit, the after visit summary with patients personalized plan was offered to patient via MyChart   Notes: Nothing significant to report at this time.

## 2023-11-01 NOTE — Patient Instructions (Addendum)
 [name] , Thank you for taking time out of your busy schedule to complete your Annual Wellness Visit with me. I enjoyed our conversation and look forward to speaking with you again next year. I, as well as your care team,  appreciate your ongoing commitment to your health goals. Please review the following plan we discussed and let me know if I can assist you in the future. Your Game plan/ To Do List    Referrals: If you haven't heard from the office you've been referred to, please reach out to them at the phone provided.   Follow up Visits: Next Medicare AWV with our clinical staff: 11/06/24 @ 9:20a   Have you seen your provider in the last 6 months (3 months if uncontrolled diabetes)?  Next Office Visit with your provider: 01/24/24 @ 9:20a  Clinician Recommendations:  Aim for 30 minutes of exercise or brisk walking, 6-8 glasses of water, and 5 servings of fruits and vegetables each day.       This is a list of the screening recommended for you and due dates:  Health Maintenance  Topic Date Due   HIV Screening  Never done   Zoster (Shingles) Vaccine (1 of 2) Never done   COVID-19 Vaccine (8 - 2024-25 season) 01/28/2023   Flu Shot  12/28/2023   Medicare Annual Wellness Visit  10/31/2024   Colon Cancer Screening  03/11/2030   DTaP/Tdap/Td vaccine (3 - Td or Tdap) 02/13/2031   Pneumococcal Vaccination  Completed   Hepatitis C Screening  Completed   HPV Vaccine  Aged Out   Meningitis B Vaccine  Aged Out    Advanced directives: (Declined) Advance directive discussed with you today. Even though you declined this today, please call our office should you change your mind, and we can give you the proper paperwork for you to fill out. Advance Care Planning is important because it:  [x]  Makes sure you receive the medical care that is consistent with your values, goals, and preferences  [x]  It provides guidance to your family and loved ones and reduces their decisional burden about whether  or not they are making the right decisions based on your wishes.  Follow the link provided in your after visit summary or read over the paperwork we have mailed to you to help you started getting your Advance Directives in place. If you need assistance in completing these, please reach out to us  so that we can help you!  See attachments for Preventive Care and Fall Prevention Tips.

## 2023-11-02 ENCOUNTER — Other Ambulatory Visit: Payer: Self-pay

## 2023-11-02 MED ORDER — ENALAPRIL MALEATE 20 MG PO TABS: 20.0000 mg | ORAL_TABLET | Freq: Two times a day (BID) | ORAL | 2 refills | Status: DC

## 2023-11-28 ENCOUNTER — Other Ambulatory Visit: Payer: Self-pay | Admitting: Urology

## 2023-11-28 DIAGNOSIS — N138 Other obstructive and reflux uropathy: Secondary | ICD-10-CM

## 2023-12-14 ENCOUNTER — Other Ambulatory Visit
Admission: RE | Admit: 2023-12-14 | Discharge: 2023-12-14 | Disposition: A | Discharge status: Home / Self Care | Attending: Urology | Admitting: Urology

## 2023-12-14 DIAGNOSIS — N138 Other obstructive and reflux uropathy: Secondary | ICD-10-CM | POA: Diagnosis present

## 2023-12-14 DIAGNOSIS — N401 Enlarged prostate with lower urinary tract symptoms: Secondary | ICD-10-CM | POA: Diagnosis present

## 2023-12-17 LAB — PSA (REFLEX TO FREE) (SERIAL): Prostate Specific Ag, Serum: 4.2 ng/mL — ABNORMAL HIGH (ref 0.0–4.0)

## 2023-12-17 LAB — FPSA% REFLEX
% FREE PSA: 25 %
PSA, FREE: 1.05 ng/mL

## 2023-12-18 ENCOUNTER — Ambulatory Visit: Payer: Self-pay | Admitting: Urology

## 2023-12-18 VITALS — BP 144/88 | HR 71 | Ht 70.0 in | Wt 240.2 lb

## 2023-12-18 DIAGNOSIS — N529 Male erectile dysfunction, unspecified: Secondary | ICD-10-CM

## 2023-12-18 DIAGNOSIS — N401 Enlarged prostate with lower urinary tract symptoms: Secondary | ICD-10-CM

## 2023-12-18 DIAGNOSIS — R972 Elevated prostate specific antigen [PSA]: Secondary | ICD-10-CM

## 2023-12-18 DIAGNOSIS — N138 Other obstructive and reflux uropathy: Secondary | ICD-10-CM | POA: Diagnosis not present

## 2023-12-18 DIAGNOSIS — R399 Unspecified symptoms and signs involving the genitourinary system: Secondary | ICD-10-CM | POA: Diagnosis not present

## 2023-12-18 LAB — BLADDER SCAN AMB NON-IMAGING

## 2023-12-18 MED ORDER — TADALAFIL 10 MG PO TABS: 10.0000 mg | ORAL_TABLET | Freq: Every day | ORAL | 6 refills | Status: DC | PRN

## 2023-12-18 NOTE — Patient Instructions (Addendum)
 Prostate Cancer Screening  Prostate cancer screening is testing that is done to check for the presence of prostate cancer in men. The prostate gland is a walnut-sized gland that is located below the bladder and in front of the rectum in males. The function of the prostate is to add fluid to semen during ejaculation. Prostate cancer is one of the most common types of cancer in men. Who should have prostate cancer screening? Screening recommendations vary based on age and other risk factors, as well as between the professional organizations who make the recommendations. In general, screening is recommended if: You are age 64 to 100 and have an average risk for prostate cancer. You should talk with your health care provider about your need for screening and how often screening should be done. Because most prostate cancers are slow growing and will not cause death, screening in this age group is generally reserved for men who have a 10- to 15-year life expectancy. You are younger than age 64, and you have these risk factors: Having a father, brother, or uncle who has been diagnosed with prostate cancer. The risk is higher if your family member's cancer occurred at an early age or if you have multiple family members with prostate cancer at an early age. Being a male who is [place] or is of [place] or sub-Saharan African descent. In general, screening is not recommended if: You are younger than age 64. You are between the ages of 36 and 45 and you have no risk factors. You are 64 years of age or older. At this age, the risks that screening can cause are greater than the benefits that it may provide. If you are at high risk for prostate cancer, your health care provider may recommend that you have screenings more often or that you start screening at a younger age. How is screening for prostate cancer done? The recommended prostate cancer screening test is a blood test called the prostate-specific antigen  (PSA) test. PSA is a protein that is made in the prostate. As you age, your prostate naturally produces more PSA. Abnormally high PSA levels may be caused by: Prostate cancer. An enlarged prostate that is not caused by cancer (benign prostatic hyperplasia, or BPH). This condition is very common in older men. A prostate gland infection (prostatitis) or urinary tract infection. Certain medicines such as male hormones (like testosterone) or other medicines that raise testosterone levels. A rectal exam may be done as part of prostate cancer screening to help provide information about the size of your prostate gland. When a rectal exam is performed, it should be done after the PSA level is drawn to avoid any effect on the results. Depending on the PSA results, you may need more tests, such as:  Blood and imaging tests. A procedure to remove tissue samples from your prostate gland for testing (biopsy). This is the only way to know for certain if you have prostate cancer. What are the benefits of prostate cancer screening? Screening can help to identify cancer at an early stage, before symptoms start and when the cancer can be treated more easily. There is a small chance that screening may lower your risk of dying from prostate cancer. The chance is small because prostate cancer is a slow-growing cancer, and most men with prostate cancer die from a different cause. What are the risks of prostate cancer screening? The main risk of prostate cancer screening is diagnosing and treating prostate cancer that would never have caused  any symptoms or problems. This is called overdiagnosisand overtreatment. PSA screening cannot tell you if your PSA is high due to cancer or a different cause. A prostate biopsy is the only procedure to diagnose prostate cancer. Even the results of a biopsy may not tell you if your cancer needs to be treated. Slow-growing prostate cancer may not need any treatment other than monitoring,  so diagnosing and treating it may cause unnecessary stress or other side effects. Questions to ask your health care provider When should I start prostate cancer screening? What is my risk for prostate cancer? How often do I need screening? What type of screening tests do I need? How do I get my test results? What do my results mean? Do I need treatment? Where to find more information The American Cancer Society: www.cancer.org American Urological Association: www.auanet.org Contact a health care provider if: You have difficulty urinating. You have pain when you urinate or ejaculate. You have blood in your urine or semen. You have pain in your back or in the area of your prostate. Summary Prostate cancer is a common type of cancer in men. The prostate gland is located below the bladder and in front of the rectum. This gland adds fluid to semen during ejaculation. Prostate cancer screening may identify cancer at an early stage, when the cancer can be treated more easily and is less likely to have spread to other areas of the body. The prostate-specific antigen (PSA) test is the recommended screening test for prostate cancer, but it has associated risks. Discuss the risks and benefits of prostate cancer screening with your health care provider. If you are age 64 or older, the risks that screening can cause are greater than the benefits that it may provide. This information is not intended to replace advice given to you by your health care provider. Make sure you discuss any questions you have with your health care provider. Document Revised: 11/08/2020 Document Reviewed: 11/08/2020 Elsevier Patient Education  2024 [employer].

## 2023-12-18 NOTE — Progress Notes (Signed)
   12/18/2023 8:51 AM   [name] 1960-06-11 [id number]  Reason for visit: Elevated PSA, history of prostate nodule, ED, BPH  HPI: 63 year old African-American with a number of comorbidities including obesity with BMI of 35, A-fib on anticoagulation, diuretic use, history of a prostate nodule within normal prostate MRI in 2022.  Clotilda Cornwall, PA felt a prostate nodule in 2022 and PSA was normal at 2.7, prostate MRI 06/2020 showed a 44 g prostate with no suspicious findings.  Repeat PSA April 2023 was stable at 2.9.  He had an isolated elevated PSA with PCP in September 2024 up to 10.9.  Urinalysis was benign at that visit.  I recommended a recheck in 1 month but this was never completed.  Most recent repeat PSA from July 2025 was 4.2 with 25% free indicating a less than 5% chance of prostate cancer.  In the setting of his prior negative prostate MRI, low PSA density 0.09, I think we can safely defer biopsy at this time.  Risk and benefits discussed and he was in agreement.  Will repeat PHI score/PSA in 6 mo, and if stable resume regular screening every 1 to 2 years  He has ED that has been responsive to 10 mg sildenafil  on demand.  He does have bothersome headaches with this medication.  We discussed a trial of Cialis  and he was interested, can go back to the sildenafil  if more effective.  Taking Flomax  nightly for BPH with excellent improvement in symptoms.  PVR today 11ml and really no urinary complaints.  Trial of Cialis  10 to 20 mg to replace sildenafil  Continue Flomax  RTC 6 months PHI score prior   Devin JAYSON Burnet, MD  [facility] [address] [place], [place] [address] [phone]

## 2024-01-10 ENCOUNTER — Other Ambulatory Visit: Payer: Self-pay | Admitting: Cardiovascular Disease

## 2024-01-21 ENCOUNTER — Ambulatory Visit: Payer: Self-pay

## 2024-01-21 NOTE — Telephone Encounter (Signed)
 FYI Only or Action Required?: FYI only for provider.  Patient was last seen in primary care on 07/27/2023 by Justus Leita DEL, MD.  Called Nurse Triage reporting Vomiting and Diarrhea.  Symptoms began yesterday.  Interventions attempted: Rest, hydration, or home remedies.  Symptoms are: gradually worsening.  Triage Disposition: See HCP Within 4 Hours (Or PCP Triage)  Patient/caregiver understands and will follow disposition?: Yes, but will wait  Message from [place] J sent at 01/21/2024  4:07 PM EDT  Summary: Vomiting and diarrhea.   Starting yesterday, patient has not have had an appetite. He has also been having nausea, vomiting, and diarrhea.      Reason for Disposition  [1] SEVERE diarrhea (e.g., 7 or more times / day more than normal) AND [2] age > 60 years  Answer Assessment - Initial Assessment Questions 1. DIARRHEA SEVERITY: How bad is the diarrhea? How many more stools have you had in the past 24 hours than normal?      Increased frequency 2. ONSET: When did the diarrhea begin?      01/20/24 3. STOOL DESCRIPTION:  How loose or watery is the diarrhea? What is the stool color? Is there any blood or mucous in the stool?     *No Answer* 4. VOMITING: Are you also vomiting? If Yes, ask: How many times in the past 24 hours?      yes 5. ABDOMEN PAIN: Are you having any abdomen pain? If Yes, ask: What does it feel like? (e.g., crampy, dull, intermittent, constant)      denies 6. ABDOMEN PAIN SEVERITY: If present, ask: How bad is the pain?  (e.g., Scale 1-10; mild, moderate, or severe)     denies 7. ORAL INTAKE: If vomiting, Have you been able to drink liquids? How much liquids have you had in the past 24 hours?     Holding down small amounts of fluid 8. HYDRATION: Any signs of dehydration? (e.g., dry mouth [not just dry lips], too weak to stand, dizziness, new weight loss) When did you last urinate?     Hydrated  9. EXPOSURE: Have you traveled  to a foreign country recently? Have you been exposed to anyone with diarrhea? Could you have eaten any food that was spoiled?     denies 10. ANTIBIOTIC USE: Are you taking antibiotics now or have you taken antibiotics in the past 2 months?       Denies  11. OTHER SYMPTOMS: Do you have any other symptoms? (e.g., fever, blood in stool)       99.6 temp. No appetite.  Protocols used: [facility]

## 2024-01-21 NOTE — Telephone Encounter (Signed)
 Noted  Pt has a appt.  KP

## 2024-01-22 ENCOUNTER — Ambulatory Visit (INDEPENDENT_AMBULATORY_CARE_PROVIDER_SITE_OTHER): Admitting: Internal Medicine

## 2024-01-22 ENCOUNTER — Encounter: Payer: Self-pay | Admitting: Internal Medicine

## 2024-01-22 VITALS — BP 118/76 | HR 76 | Temp 97.6°F | Ht 70.0 in | Wt 230.0 lb

## 2024-01-22 DIAGNOSIS — A084 Viral intestinal infection, unspecified: Secondary | ICD-10-CM | POA: Diagnosis not present

## 2024-01-22 NOTE — Progress Notes (Signed)
 Date:  01/22/2024   Name:  [name]   DOB:  05/05/1961   MRN:  [id number]   Chief Complaint: Diarrhea  Diarrhea  This is a new problem. The current episode started yesterday (three days ago). The problem has been gradually improving. The stool consistency is described as Watery. The patient states that diarrhea does not awaken him from sleep. Associated symptoms include bloating, a fever and vomiting. Pertinent negatives include no chills or headaches. Risk factors include ill contacts.    Review of Systems  Constitutional:  Positive for fever. Negative for chills.  Respiratory:  Negative for chest tightness and shortness of breath.   Cardiovascular:  Negative for chest pain.  Gastrointestinal:  Positive for bloating, diarrhea and vomiting.  Neurological:  Negative for dizziness and headaches.  Psychiatric/Behavioral:  Negative for dysphoric mood and sleep disturbance. The patient is not nervous/anxious.      Lab Results  Component Value Date   NA 140 07/10/2023   K 3.3 (L) 07/10/2023   CO2 25 07/10/2023   GLUCOSE 93 07/10/2023   BUN 16 07/10/2023   CREATININE 1.03 07/10/2023   CALCIUM  9.0 07/10/2023   EGFR 83 02/26/2023   GFRNONAA >60 07/10/2023   Lab Results  Component Value Date   CHOL 210 (H) 02/26/2023   HDL 46 02/26/2023   LDLCALC 127 (H) 02/26/2023   TRIG 210 (H) 02/26/2023   CHOLHDL 4.6 02/26/2023   Lab Results  Component Value Date   TSH 1.470 02/26/2023   Lab Results  Component Value Date   HGBA1C 5.8 (H) 02/26/2023   Lab Results  Component Value Date   WBC 9.0 07/10/2023   HGB 14.0 07/10/2023   HCT 45.2 07/10/2023   MCV 87.1 07/10/2023   PLT 182 07/10/2023   Lab Results  Component Value Date   ALT 23 07/10/2023   AST 23 07/10/2023   ALKPHOS 56 07/10/2023   BILITOT 0.5 07/10/2023   Lab Results  Component Value Date   VD25OH 74.6 02/26/2023     Patient Active Problem List   Diagnosis Date Noted   Not currently working  due to disabled status 08/29/2022   BPH with obstruction/lower urinary tract symptoms 08/29/2022   Chronic sinusitis 08/29/2022   Mixed hyperlipidemia 08/29/2022   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   Immunosuppressed status (HCC) 03/21/2022   Prediabetes 03/21/2022   Idiopathic chronic gout of multiple sites without tophus 09/13/2021   Systemic lupus erythematosus (SLE) in adult [facility]) 07/23/2017   Vitamin D  deficiency 07/23/2017   Polyp of sigmoid colon    Carpal tunnel syndrome 08/08/2016   Degenerative disc disease 08/08/2016   Sarcoidosis 04/19/2016   Permanent atrial fibrillation (HCC) 03/21/2016   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   Hernia, hiatal    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/2015   Typical atrial flutter (HCC)    Tachycardia-induced cardiomyopathy (HCC)    Essential hypertension    Pulmonary fibrosis (HCC)    Nontoxic uninodular goiter 04/20/2014   Mixed connective tissue disease (HCC) 02/03/2014    Allergies  Allergen Reactions   Doxycycline Palpitations   Allopurinol Diarrhea   Probenecid Nausea Only and Other (See Comments)    Other reaction(s): Headache  Other Reaction(s): Other (see comments)    Past Surgical History:  Procedure Laterality Date   CARDIAC CATHETERIZATION N/A 12/03/2014   Procedure: Left Heart Cath and Coronary Angiography;  Surgeon: Evalene JINNY Lunger, MD;  Location: [facility] INVASIVE CV LAB;  Service: Cardiovascular;  Laterality: N/A;  COLONOSCOPY WITH PROPOFOL  N/A 11/23/2015   Procedure: COLONOSCOPY WITH PROPOFOL ;  Surgeon: Rogelia Copping, MD;  Location: [facility] ENDOSCOPY;  Service: Endoscopy;  Laterality: N/A;   COLONOSCOPY WITH PROPOFOL  N/A 05/04/2017   Procedure: COLONOSCOPY WITH PROPOFOL ;  Surgeon: Copping Rogelia, MD;  Location: [facility];  Service: Endoscopy;  Laterality: N/A;   ESOPHAGOGASTRODUODENOSCOPY (EGD) WITH PROPOFOL  N/A 11/23/2015   Procedure: ESOPHAGOGASTRODUODENOSCOPY (EGD) WITH PROPOFOL ;  Surgeon: Rogelia Copping, MD;   Location: [facility] ENDOSCOPY;  Service: Endoscopy;  Laterality: N/A;   HAND SURGERY Left    PERIPHERAL VASCULAR CATHETERIZATION N/A 11/17/2015   Procedure: Upper Extremity Angiography;  Surgeon: Deatrice DELENA Cage, MD;  Location: [facility] INVAS[facility] CV [facility];  Service: Cardiovascular;  Laterality: N/A;   POLYPECTOMY  05/04/2017   Procedure: POLYPECTOMY;  Surgeon: Copping Rogelia, MD;  Location: [facility];  Service: Endoscopy;;    Social History   Tobacco Use   Smoking status: Former    Current packs/day: 0.00    Average packs/day: 1 pack/day for 20.0 years (20.0 ttl pk-yrs)    Types: Cigarettes    Start date: 08/21/1978    Quit date: 08/21/1998    Years since quitting: 25.4    Passive exposure: Past   Smokeless tobacco: Never  Vaping Use   Vaping status: Never Used  Substance Use Topics   Alcohol use: No   Drug use: No     Medication list has been reviewed and updated.  No outpatient medications have been marked as taking for the 01/22/24 encounter (Office Visit) with Justus Leita DEL, MD.       07/27/2023   10:20 AM 05/28/2023    1:56 PM 05/14/2023   10:33 AM 02/26/2023    8:11 AM  GAD 7 : Generalized Anxiety Score  Nervous, Anxious, on Edge 0 0 0 0  Control/stop worrying 0 0 0 0  Worry too much - different things  0 0 0  Trouble relaxing  0 0 1  Restless  0 0 0  Easily annoyed or irritable  0 0 0  Afraid - awful might happen  0 0 1  Total GAD 7 Score  0 0 2  Anxiety Difficulty  Not difficult at all Not difficult at all Not difficult at all       11/01/2023    8:57 AM 07/27/2023   10:20 AM 05/28/2023    1:56 PM  Depression screen PHQ 2/9  Decreased Interest 0 0 0  Down, Depressed, Hopeless 0 0 0  PHQ - 2 Score 0 0 0  Altered sleeping   0  Tired, decreased energy   0  Change in appetite   0  Feeling bad or failure about yourself    0  Trouble concentrating   0  Moving slowly or fidgety/restless   0  Suicidal thoughts   0  PHQ-9 Score   0  Difficult doing  work/chores   Not difficult at all    BP Readings from Last 3 Encounters:  01/22/24 118/76  12/18/23 (!) 144/88  07/27/23 136/84    Physical Exam Vitals and nursing note reviewed.  Constitutional:      General: He is not in acute distress.    Appearance: Normal appearance. He is well-developed. He is not ill-appearing.  HENT:     Head: Normocephalic and atraumatic.  Cardiovascular:     Rate and Rhythm: Normal rate and regular rhythm.  Pulmonary:     Effort: Pulmonary effort is normal. No respiratory distress.  Breath sounds: No wheezing or rhonchi.  Abdominal:     Palpations: Abdomen is soft.     Tenderness: There is no abdominal tenderness. There is no guarding or rebound.  Musculoskeletal:     Cervical back: Normal range of motion.  Lymphadenopathy:     Cervical: No cervical adenopathy.  Skin:    General: Skin is warm and dry.     Findings: No rash.  Neurological:     Mental Status: He is alert and oriented to person, place, and time.  Psychiatric:        Mood and Affect: Mood normal.        Behavior: Behavior normal.     Wt Readings from Last 3 Encounters:  01/22/24 230 lb (104.3 kg)  12/18/23 240 lb 3.2 oz (109 kg)  11/01/23 239 lb (108.4 kg)    BP 118/76   Pulse 76   Temp 97.6 F (36.4 C) (Oral)   Ht 5' 10 (1.778 m)   Wt 230 lb (104.3 kg)   SpO2 96%   BMI 33.00 kg/m   Assessment and Plan:  Problem List Items Addressed This Visit   None Visit Diagnoses       Viral gastroenteritis    -  Primary   now resolving - recommend sufficient fluids and advance diet as tolerated follow up if recurrent symptoms       No follow-ups on file.    Leita HILARIO Adie, MD [facility] [place]

## 2024-01-24 ENCOUNTER — Ambulatory Visit (INDEPENDENT_AMBULATORY_CARE_PROVIDER_SITE_OTHER): Payer: Medicare HMO | Admitting: Internal Medicine

## 2024-01-24 ENCOUNTER — Encounter: Payer: Self-pay | Admitting: Internal Medicine

## 2024-01-24 VITALS — BP 136/78 | HR 68 | Ht 70.0 in | Wt 231.0 lb

## 2024-01-24 DIAGNOSIS — M329 Systemic lupus erythematosus, unspecified: Secondary | ICD-10-CM | POA: Diagnosis not present

## 2024-01-24 DIAGNOSIS — E041 Nontoxic single thyroid nodule: Secondary | ICD-10-CM | POA: Diagnosis not present

## 2024-01-24 DIAGNOSIS — D6859 Other primary thrombophilia: Secondary | ICD-10-CM | POA: Insufficient documentation

## 2024-01-24 DIAGNOSIS — R7303 Prediabetes: Secondary | ICD-10-CM

## 2024-01-24 DIAGNOSIS — I4892 Unspecified atrial flutter: Secondary | ICD-10-CM

## 2024-01-24 DIAGNOSIS — I10 Essential (primary) hypertension: Secondary | ICD-10-CM

## 2024-01-24 DIAGNOSIS — E782 Mixed hyperlipidemia: Secondary | ICD-10-CM | POA: Diagnosis not present

## 2024-01-24 DIAGNOSIS — M351 Other overlap syndromes: Secondary | ICD-10-CM | POA: Diagnosis not present

## 2024-01-24 DIAGNOSIS — N401 Enlarged prostate with lower urinary tract symptoms: Secondary | ICD-10-CM

## 2024-01-24 DIAGNOSIS — Z0000 Encounter for general adult medical examination without abnormal findings: Secondary | ICD-10-CM | POA: Diagnosis not present

## 2024-01-24 DIAGNOSIS — N138 Other obstructive and reflux uropathy: Secondary | ICD-10-CM

## 2024-01-24 NOTE — Assessment & Plan Note (Signed)
 Managed with diet. Lab Results  Component Value Date   HGBA1C 5.8 (H) 02/26/2023

## 2024-01-24 NOTE — Assessment & Plan Note (Signed)
 Followed by Rheumatology On multiple medications.

## 2024-01-24 NOTE — Progress Notes (Signed)
 Date:  01/24/2024   Name:  [name]   DOB:  1960-08-22   MRN:  [id number]   Chief Complaint: Annual Exam [name] is a 63 y.o. male who presents today for his Complete Annual Exam. He feels well. He reports exercising. He reports he is sleeping fairly well.   Health Maintenance  Topic Date Due   HIV Screening  Never done   COVID-19 Vaccine (8 - 2024-25 season) 01/28/2023   Zoster (Shingles) Vaccine (1 of 2) 04/25/2024*   Medicare Annual Wellness Visit  10/31/2024   Colon Cancer Screening  03/12/2027   DTaP/Tdap/Td vaccine (3 - Td or Tdap) 02/13/2031   Pneumococcal Vaccine for age over 76  Completed   Flu Shot  Completed   Hepatitis C Screening  Completed   Hepatitis B Vaccine  Aged Out   HPV Vaccine  Aged Out   Meningitis B Vaccine  Aged Out  *Topic was postponed. The date shown is not the original due date.    Lab Results  Component Value Date   PSA1 4.2 (H) 12/14/2023   PSA1 10.9 (H) 02/26/2023    Hypertension This is a chronic problem. The problem is controlled. Associated symptoms include palpitations (rare PVCs - no symptomatic). Pertinent negatives include no chest pain, headaches or shortness of breath. Past treatments include beta blockers, calcium  channel blockers and ACE inhibitors. The current treatment provides significant improvement. Hypertensive end-organ damage includes CAD/MI. There is no history of kidney disease or CVA. Identifiable causes of hypertension include a thyroid  problem.  Hyperlipidemia This is a chronic problem. Pertinent negatives include no chest pain or shortness of breath. Current antihyperlipidemic treatment includes statins. The current treatment provides significant improvement of lipids.  Diabetes He presents for his follow-up diabetic visit. Diabetes type: prediabetes. Pertinent negatives for hypoglycemia include no dizziness, headaches or nervousness/anxiousness. Pertinent negatives for diabetes include no  chest pain, no fatigue and no weakness. Pertinent negatives for diabetic complications include no CVA.  Thyroid  Problem Presents for follow-up visit. Symptoms include palpitations (rare PVCs - no symptomatic). Patient reports no anxiety, constipation, diarrhea or fatigue. The symptoms have been stable. His past medical history is significant for hyperlipidemia.  SLE/MCTD/stage 2 CKD - followed by rheumatology and Nephrology.  On multiple medications but doing well.  Review of Systems  Constitutional:  Negative for appetite change, fatigue and unexpected weight change.  HENT:  Negative for nosebleeds and trouble swallowing.   Eyes:  Negative for visual disturbance.  Respiratory:  Negative for cough, chest tightness, shortness of breath and wheezing.   Cardiovascular:  Positive for palpitations (rare PVCs - no symptomatic). Negative for chest pain and leg swelling.  Gastrointestinal:  Negative for abdominal pain, constipation and diarrhea.  Genitourinary:  Negative for dysuria and hematuria.  Musculoskeletal:  Negative for arthralgias and gait problem.  Skin:  Positive for color change (some scalp depigmentation). Negative for wound.  Neurological:  Negative for dizziness, weakness, light-headedness and headaches.  Hematological:  Does not bruise/bleed easily.  Psychiatric/Behavioral:  Negative for dysphoric mood and sleep disturbance. The patient is not nervous/anxious.      Lab Results  Component Value Date   NA 140 07/10/2023   K 3.3 (L) 07/10/2023   CO2 25 07/10/2023   GLUCOSE 93 07/10/2023   BUN 16 07/10/2023   CREATININE 1.03 07/10/2023   CALCIUM  9.0 07/10/2023   EGFR 83 02/26/2023   GFRNONAA >60 07/10/2023   Lab Results  Component Value Date   CHOL 210 (  H) 02/26/2023   HDL 46 02/26/2023   LDLCALC 127 (H) 02/26/2023   TRIG 210 (H) 02/26/2023   CHOLHDL 4.6 02/26/2023   Lab Results  Component Value Date   TSH 1.470 02/26/2023   Lab Results  Component Value Date    HGBA1C 5.8 (H) 02/26/2023   Lab Results  Component Value Date   WBC 9.0 07/10/2023   HGB 14.0 07/10/2023   HCT 45.2 07/10/2023   MCV 87.1 07/10/2023   PLT 182 07/10/2023   Lab Results  Component Value Date   ALT 23 07/10/2023   AST 23 07/10/2023   ALKPHOS 56 07/10/2023   BILITOT 0.5 07/10/2023   Lab Results  Component Value Date   VD25OH 74.6 02/26/2023     Patient Active Problem List   Diagnosis Date Noted   Thrombophilia (HCC) 01/24/2024   Not currently working due to disabled status 08/29/2022   BPH with obstruction/lower urinary tract symptoms 08/29/2022   Chronic sinusitis 08/29/2022   Mixed hyperlipidemia 08/29/2022   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   Immunosuppressed status (HCC) 03/21/2022   Prediabetes 03/21/2022   Idiopathic chronic gout of multiple sites without tophus 09/13/2021   Systemic lupus erythematosus (SLE) in adult (HCC) 07/23/2017   Vitamin D  deficiency 07/23/2017   Polyp of sigmoid colon    Carpal tunnel syndrome 08/08/2016   Degenerative disc disease 08/08/2016   Sarcoidosis 04/19/2016   Atrial flutter, paroxysmal (HCC) 03/21/2016   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   Hernia, hiatal    Typical atrial flutter (HCC)    Tachycardia-induced cardiomyopathy (HCC)    Essential hypertension    Pulmonary fibrosis (HCC)    Nontoxic uninodular goiter 04/20/2014   Mixed connective tissue disease (HCC) 02/03/2014    Allergies  Allergen Reactions   Doxycycline Palpitations   Allopurinol Diarrhea   Probenecid Nausea Only and Other (See Comments)    Other reaction(s): Headache  Other Reaction(s): Other (see comments)    Past Surgical History:  Procedure Laterality Date   CARDIAC CATHETERIZATION N/A 12/03/2014   Procedure: Left Heart Cath and Coronary Angiography;  Surgeon: Evalene JINNY Lunger, MD;  Location: [facility];  Service: Cardiovascular;  Laterality: N/A;   COLONOSCOPY WITH PROPOFOL  N/A 11/23/2015    Procedure: COLONOSCOPY WITH PROPOFOL ;  Surgeon: Rogelia Copping, MD;  Location: [facility];  Service: Endoscopy;  Laterality: N/A;   COLONOSCOPY WITH PROPOFOL  N/A 05/04/2017   Procedure: COLONOSCOPY WITH PROPOFOL ;  Surgeon: Copping Rogelia, MD;  Location: [facility];  Service: Endoscopy;  Laterality: N/A;   ESOPHAGOGASTRODUODENOSCOPY (EGD) WITH PROPOFOL  N/A 11/23/2015   Procedure: ESOPHAGOGASTRODUODENOSCOPY (EGD) WITH PROPOFOL ;  Surgeon: Rogelia Copping, MD;  Location: [facility];  Service: Endoscopy;  Laterality: N/A;   HAND SURGERY Left    PERIPHERAL VASCULAR CATHETERIZATION N/A 11/17/2015   Procedure: Upper Extremity Angiography;  Surgeon: Deatrice DELENA Cage, MD;  Location: [facility];  Service: Cardiovascular;  Laterality: N/A;   POLYPECTOMY  05/04/2017   Procedure: POLYPECTOMY;  Surgeon: Copping Rogelia, MD;  Location: [facility];  Service: Endoscopy;;    Social History   Tobacco Use   Smoking status: Former    Current packs/day: 0.00    Average packs/day: 1 pack/day for 20.0 years (20.0 ttl pk-yrs)    Types: Cigarettes    Start date: 08/21/1978    Quit date: 08/21/1998    Years since quitting: 25.4    Passive exposure: Past   Smokeless tobacco: Never  Vaping Use   Vaping status: Never Used  Substance Use Topics   Alcohol use: No   Drug use: No     Medication list has been reviewed and updated.  Current Meds  Medication Sig   albuterol  (VENTOLIN  HFA) 108 (90 Base) MCG/ACT inhaler Inhale into the lungs as needed.   apixaban  (ELIQUIS ) 5 MG TABS tablet TAKE 1 TABLET TWICE DAILY   atorvastatin  (LIPITOR) 40 MG tablet Take 0.5 tablets (20 mg total) by mouth daily.   azaTHIOprine (IMURAN) 50 MG tablet Take 50 mg by mouth daily.   BENLYSTA 200 MG/ML SOAJ Inject 200 mg into the skin once a week.   colchicine  0.6 MG tablet Take 0.6 mg by mouth daily.   [employer] NASAL ALLERGY SPRAY 55 MCG/ACT AERO nasal inhaler SMARTSIG:2 Spray(s) Both Nares Daily   diltiazem  (CARDIZEM   CD) 120 MG 24 hr capsule TAKE 1 CAPSULE TWICE DAILY   enalapril  (VASOTEC ) 20 MG tablet Take 1 tablet (20 mg total) by mouth 2 (two) times daily.   febuxostat (ULORIC) 40 MG tablet Take 40 mg by mouth daily.   fluticasone -salmeterol (ADVAIR) 250-50 MCG/ACT AEPB Inhale into the lungs daily.   hydrocortisone 2.5 % cream Apply 1 Application topically 2 (two) times daily.   hydroxychloroquine (PLAQUENIL) 200 MG tablet Take 1 tablet by mouth 2 (two) times daily.   ketoconazole (NIZORAL) 2 % shampoo Apply topically 3 (three) times a week.   metoprolol  tartrate (LOPRESSOR ) 100 MG tablet TAKE 1 TABLET TWICE DAILY   Multiple Vitamin (MULTI-VITAMINS) TABS Take by mouth.   mycophenolate (CELLCEPT) 500 MG tablet Take 2 tablets by mouth 2 (two) times daily.   pantoprazole  (PROTONIX ) 40 MG tablet Take 40 mg by mouth 2 (two) times daily.   potassium chloride  SA (KLOR-CON  M) 20 MEQ tablet TAKE 1 TABLET EVERY DAY   predniSONE  (DELTASONE ) 5 MG tablet Take 10 mg by mouth daily with breakfast.   sildenafil  (VIAGRA ) 50 MG tablet Take 1-2 tablets (50-100 mg total) by mouth daily as needed for erectile dysfunction.   tadalafil  (CIALIS ) 10 MG tablet Take 1-2 tablets (10-20 mg total) by mouth daily as needed (take 1 hour prior to sexual activity).   tamsulosin  (FLOMAX ) 0.4 MG CAPS capsule TAKE 1 CAPSULE EVERY DAY   torsemide  (DEMADEX ) 20 MG tablet TAKE 1/2 TABLET EVERY DAY       01/24/2024    9:06 AM 07/27/2023   10:20 AM 05/28/2023    1:56 PM 05/14/2023   10:33 AM  GAD 7 : Generalized Anxiety Score  Nervous, Anxious, on Edge 0 0 0 0  Control/stop worrying 0 0 0 0  Worry too much - different things 0  0 0  Trouble relaxing 0  0 0  Restless 0  0 0  Easily annoyed or irritable 0  0 0  Afraid - awful might happen 0  0 0  Total GAD 7 Score 0  0 0  Anxiety Difficulty Not difficult at all  Not difficult at all Not difficult at all       01/24/2024    9:06 AM 11/01/2023    8:57 AM 07/27/2023   10:20 AM   Depression screen PHQ 2/9  Decreased Interest 0 0 0  Down, Depressed, Hopeless 0 0 0  PHQ - 2 Score 0 0 0  Altered sleeping 0    Tired, decreased energy 0    Change in appetite 0    Feeling bad or failure about yourself  0    Trouble concentrating 0    Moving slowly or  fidgety/restless 0    Suicidal thoughts 0    PHQ-9 Score 0    Difficult doing work/chores Not difficult at all      BP Readings from Last 3 Encounters:  01/24/24 136/78  01/22/24 118/76  12/18/23 (!) 144/88    Physical Exam Vitals and nursing note reviewed.  Constitutional:      Appearance: Normal appearance. He is well-developed.  HENT:     Head: Normocephalic.     Right Ear: Tympanic membrane, ear canal and external ear normal.     Left Ear: Tympanic membrane, ear canal and external ear normal.     Nose: Nose normal.  Eyes:     Conjunctiva/sclera: Conjunctivae normal.     Pupils: Pupils are equal, round, and reactive to light.  Neck:     Thyroid : No thyromegaly.     Vascular: No carotid bruit.  Cardiovascular:     Rate and Rhythm: Normal rate and regular rhythm. Occasional Extrasystoles are present.    Pulses: Normal pulses.     Heart sounds: Normal heart sounds. No murmur heard. Pulmonary:     Effort: Pulmonary effort is normal.     Breath sounds: Normal breath sounds. No wheezing.  Chest:  Breasts:    Right: No mass.     Left: No mass.  Abdominal:     General: Abdomen is protuberant. Bowel sounds are normal.     Palpations: Abdomen is soft.     Tenderness: There is no abdominal tenderness. There is no guarding or rebound.  Musculoskeletal:        General: Normal range of motion.     Cervical back: Normal range of motion and neck supple.     Right lower leg: No edema.     Left lower leg: No edema.  Lymphadenopathy:     Cervical: No cervical adenopathy.  Skin:    General: Skin is warm and dry.  Neurological:     General: No focal deficit present.     Mental Status: He is alert and  oriented to person, place, and time.     Deep Tendon Reflexes: Reflexes are normal and symmetric.  Psychiatric:        Attention and Perception: Attention normal.        Mood and Affect: Mood normal.        Thought Content: Thought content normal.     Wt Readings from Last 3 Encounters:  01/24/24 231 lb (104.8 kg)  01/22/24 230 lb (104.3 kg)  12/18/23 240 lb 3.2 oz (109 kg)    BP 136/78   Pulse 68   Ht 5' 10 (1.778 m)   Wt 231 lb (104.8 kg)   SpO2 97%   BMI 33.15 kg/m   Assessment and Plan:  Problem List Items Addressed This Visit       Unprioritized   Essential hypertension (Chronic)   BP controlled on multiple medications.      Relevant Orders   CBC with Differential/Platelet   Atrial flutter, paroxysmal (HCC) (Chronic)   Followed by Cardiology.  EF improved. He was transitioned from Warfarin the Eliquis  recently. Doing well without bleeding      Mixed connective tissue disease (HCC)   Followed by rheumatology. Symptoms stable. On Imuran, Plaquenil, Cellcept and Benlysta.      Nontoxic uninodular goiter   Relevant Orders   TSH + free T4   Prediabetes   Managed with diet. Lab Results  Component Value Date   HGBA1C 5.8 (H) 02/26/2023  Relevant Orders   Hemoglobin A1c   Systemic lupus erythematosus (SLE) in adult [facility])   Followed by Rheumatology On multiple medications.      BPH with obstruction/lower urinary tract symptoms   He is followed by Urology - PSA was > 10 but repeat in July 4.2. He continues on Flomax  with good response.      Mixed hyperlipidemia   LDL is  Lab Results  Component Value Date   LDLCALC 127 (H) 02/26/2023   Currently taking atorvastatin    No medication side effects or other concerns. Recommended LDL goal is < 70.       Relevant Orders   Lipid panel   Thrombophilia (HCC)   Previously on Warfarin but now on Eliquis  since it is more affordable.      Other Visit Diagnoses       Annual physical exam     -  Primary   screenings and immunizations are up to date continue to work on healthy diet and exercise as able       Return in about 6 months (around 07/26/2024) for HTN Dr. Lemon .    Leita HILARIO Adie, MD [facility] [place]

## 2024-01-24 NOTE — Assessment & Plan Note (Addendum)
 Followed by rheumatology. Symptoms stable. On Imuran, Plaquenil, Cellcept and Benlysta.

## 2024-01-24 NOTE — Assessment & Plan Note (Signed)
 LDL is  Lab Results  Component Value Date   LDLCALC 127 (H) 02/26/2023   Currently taking atorvastatin    No medication side effects or other concerns. Recommended LDL goal is < 70.

## 2024-01-24 NOTE — Assessment & Plan Note (Signed)
BP controlled on multiple medications.

## 2024-01-24 NOTE — Assessment & Plan Note (Signed)
 Followed by Cardiology.  EF improved. He was transitioned from Warfarin the Eliquis  recently. Doing well without bleeding

## 2024-01-24 NOTE — Assessment & Plan Note (Signed)
 He is followed by Urology - PSA was > 10 but repeat in July 4.2. He continues on Flomax  with good response.

## 2024-01-24 NOTE — Assessment & Plan Note (Signed)
 Previously on Warfarin but now on Eliquis  since it is more affordable.

## 2024-01-25 ENCOUNTER — Ambulatory Visit: Payer: Self-pay | Admitting: Internal Medicine

## 2024-01-25 LAB — HEMOGLOBIN A1C
Est. average glucose Bld gHb Est-mCnc: 117 mg/dL
Hgb A1c MFr Bld: 5.7 % — ABNORMAL HIGH (ref 4.8–5.6)

## 2024-01-25 LAB — LIPID PANEL
Chol/HDL Ratio: 5.1 ratio — ABNORMAL HIGH (ref 0.0–5.0)
Cholesterol, Total: 233 mg/dL — ABNORMAL HIGH (ref 100–199)
HDL: 46 mg/dL (ref >39)
LDL Chol Calc (NIH): 138 mg/dL — ABNORMAL HIGH (ref 0–99)
Triglycerides: 272 mg/dL — ABNORMAL HIGH (ref 0–149)
VLDL Cholesterol Cal: 49 mg/dL — ABNORMAL HIGH (ref 5–40)

## 2024-01-25 LAB — CBC WITH DIFFERENTIAL/PLATELET
Basophils Absolute: 0 x10E3/uL (ref 0.0–0.2)
Basos: 0 %
EOS (ABSOLUTE): 0.2 x10E3/uL (ref 0.0–0.4)
Eos: 2 %
Hematocrit: 47.5 % (ref 37.5–51.0)
Hemoglobin: 15.1 g/dL (ref 13.0–17.7)
Immature Grans (Abs): 0.1 x10E3/uL (ref 0.0–0.1)
Immature Granulocytes: 1 %
Lymphocytes Absolute: 1.6 x10E3/uL (ref 0.7–3.1)
Lymphs: 15 %
MCH: 28.9 pg (ref 26.6–33.0)
MCHC: 31.8 g/dL (ref 31.5–35.7)
MCV: 91 fL (ref 79–97)
Monocytes Absolute: 1.1 x10E3/uL — ABNORMAL HIGH (ref 0.1–0.9)
Monocytes: 11 %
Neutrophils Absolute: 7.3 x10E3/uL — ABNORMAL HIGH (ref 1.4–7.0)
Neutrophils: 71 %
Platelets: 168 x10E3/uL (ref 150–450)
RBC: 5.22 x10E6/uL (ref 4.14–5.80)
RDW: 14.4 % (ref 11.6–15.4)
WBC: 10.2 x10E3/uL (ref 3.4–10.8)

## 2024-01-25 LAB — TSH+FREE T4
Free T4: 1.51 ng/dL (ref 0.82–1.77)
TSH: 2.36 u[IU]/mL (ref 0.450–4.500)

## 2024-02-06 ENCOUNTER — Other Ambulatory Visit: Payer: Self-pay

## 2024-02-06 ENCOUNTER — Encounter: Payer: Self-pay | Admitting: Internal Medicine

## 2024-02-06 MED ORDER — COVID-19 MRNA VAC-TRIS(PFIZER) 30 MCG/0.3ML IM SUSY: 0.3000 mL | PREFILLED_SYRINGE | Freq: Once | INTRAMUSCULAR | 0 refills | Status: AC

## 2024-02-09 ENCOUNTER — Other Ambulatory Visit: Payer: Self-pay | Admitting: Cardiology

## 2024-02-09 DIAGNOSIS — E782 Mixed hyperlipidemia: Secondary | ICD-10-CM

## 2024-02-17 ENCOUNTER — Other Ambulatory Visit: Payer: Self-pay | Admitting: Cardiovascular Disease

## 2024-02-18 NOTE — Telephone Encounter (Signed)
 Prescription refill request for Eliquis  received. Indication:afib Last office visit:2/25 Scr:1.03  7/25 Age: 63 Weight:104.8  kg  Prescription refilled

## 2024-03-12 DIAGNOSIS — H40003 Preglaucoma, unspecified, bilateral: Secondary | ICD-10-CM | POA: Diagnosis not present

## 2024-03-12 DIAGNOSIS — Z79899 Other long term (current) drug therapy: Secondary | ICD-10-CM | POA: Diagnosis not present

## 2024-03-19 DIAGNOSIS — H2513 Age-related nuclear cataract, bilateral: Secondary | ICD-10-CM | POA: Diagnosis not present

## 2024-03-19 DIAGNOSIS — M069 Rheumatoid arthritis, unspecified: Secondary | ICD-10-CM | POA: Diagnosis not present

## 2024-03-19 DIAGNOSIS — Z79899 Other long term (current) drug therapy: Secondary | ICD-10-CM | POA: Diagnosis not present

## 2024-03-19 DIAGNOSIS — H35713 Central serous chorioretinopathy, bilateral: Secondary | ICD-10-CM | POA: Diagnosis not present

## 2024-03-31 ENCOUNTER — Ambulatory Visit: Admitting: Physician Assistant

## 2024-04-02 DIAGNOSIS — Z7952 Long term (current) use of systemic steroids: Secondary | ICD-10-CM | POA: Diagnosis not present

## 2024-04-02 DIAGNOSIS — Z7960 Long term (current) use of unspecified immunomodulators and immunosuppressants: Secondary | ICD-10-CM | POA: Diagnosis not present

## 2024-04-02 DIAGNOSIS — M3322 Polymyositis with myopathy: Secondary | ICD-10-CM | POA: Diagnosis not present

## 2024-04-02 DIAGNOSIS — I7301 Raynaud's syndrome with gangrene: Secondary | ICD-10-CM | POA: Diagnosis not present

## 2024-04-02 DIAGNOSIS — M1A09X0 Idiopathic chronic gout, multiple sites, without tophus (tophi): Secondary | ICD-10-CM | POA: Diagnosis not present

## 2024-04-02 DIAGNOSIS — M351 Other overlap syndromes: Secondary | ICD-10-CM | POA: Diagnosis not present

## 2024-04-02 DIAGNOSIS — M329 Systemic lupus erythematosus, unspecified: Secondary | ICD-10-CM | POA: Diagnosis not present

## 2024-04-07 ENCOUNTER — Ambulatory Visit: Attending: Physician Assistant | Admitting: Physician Assistant

## 2024-04-07 ENCOUNTER — Encounter: Payer: Self-pay | Admitting: Physician Assistant

## 2024-04-07 VITALS — BP 120/80 | HR 71 | Ht 70.0 in | Wt 233.0 lb

## 2024-04-07 DIAGNOSIS — E785 Hyperlipidemia, unspecified: Secondary | ICD-10-CM | POA: Diagnosis not present

## 2024-04-07 DIAGNOSIS — I5020 Unspecified systolic (congestive) heart failure: Secondary | ICD-10-CM

## 2024-04-07 DIAGNOSIS — Z79899 Other long term (current) drug therapy: Secondary | ICD-10-CM | POA: Diagnosis not present

## 2024-04-07 DIAGNOSIS — I471 Supraventricular tachycardia, unspecified: Secondary | ICD-10-CM

## 2024-04-07 DIAGNOSIS — J9611 Chronic respiratory failure with hypoxia: Secondary | ICD-10-CM

## 2024-04-07 DIAGNOSIS — I7121 Aneurysm of the ascending aorta, without rupture: Secondary | ICD-10-CM

## 2024-04-07 DIAGNOSIS — I4892 Unspecified atrial flutter: Secondary | ICD-10-CM | POA: Diagnosis not present

## 2024-04-07 DIAGNOSIS — I2510 Atherosclerotic heart disease of native coronary artery without angina pectoris: Secondary | ICD-10-CM

## 2024-04-07 DIAGNOSIS — J8410 Pulmonary fibrosis, unspecified: Secondary | ICD-10-CM

## 2024-04-07 DIAGNOSIS — I10 Essential (primary) hypertension: Secondary | ICD-10-CM

## 2024-04-07 NOTE — Progress Notes (Signed)
 Cardiology Office Note    Date:  04/07/2024   ID:  [name], DOB June 06, 1960, MRN [id number]  PCP:  Justus Leita DEL, MD  Cardiologist:  Evalene Lunger, MD  Electrophysiologist:  None   Chief Complaint: Follow up  History of Present Illness:   [name] is a 63 y.o. male with history of coronary artery calcification noted on CT imaging, atrial flutter on apixaban  with tachycardia mediated cardiomyopathy with subsequent improvement in LV systolic function, PSVT, ascending thoracic aortic aneurysm, chronic hypoxic respiratory failure on supplemental oxygen with pulmonary sarcoidosis and fibrosis, discoid lupus, rheumatoid arthritis, HTN, anemia, and GERD who presents for follow-up of atrial flutter, HFimpEF, and the ascending thoracic aortic aneurysm.   He was initially found to be in atrial flutter in 2016, at which time his EF was 20 to 25%.  Ischemic evaluation was negative.  He subsequently underwent TEE guided DCCV in 08/2014.  TEE at that time showed an EF of 30 to 35% with mild to moderate aortic valve sclerosis without evidence of stenosis or (tricuspid aortic valve).  Outpatient cardiac monitoring showed sinus rhythm with frequent PVCs and no further evidence of atrial flutter.  Following cardioversion, echo in 09/2014 showed an EF of 55 to 60%.   LHC in 11/2014 showed no significant CAD with an EF greater than 55%.  He was admitted to the hospital in 10/2015 with weakness and fall with a hemoglobin of 5.5 requiring multiple units of PRBC.  GI evaluation was unrevealing.  Further evaluation by hematology confirmed hemolytic anemia on prednisone .  Echo from 10/2015 showed an EF of 60 to 65%, no regional wall motion abnormalities, normal LV diastolic function parameters, normal RV systolic function and PASP.  During the study, frequent PACs and PVCs were noted.  Most recent echo from 04/2022 showed an EF of 55 to 60%, no regional wall motion abnormalities, grade 1  diastolic dysfunction, poorly visualized RV with systolic function appearing mildly reduced, mildly enlarged RV cavity size, mildly dilated left atrium, aortic valve sclerosis without evidence of stenosis, and mild dilatation of the ascending aorta measuring 40 mm.  Zio patch at that time showed a predominant rhythm of sinus with an average rate of 64 bpm, 344 episodes of SVT lasting up to 8 beats, frequent PACs with an overall burden of 11.5%, and an occasional PVCs with an overall burden of 1.7%.  In this setting, metoprolol  was titrated to 100 mg daily and in follow-up diltiazem  was increased to 120 mg twice daily.  CT chest in 03/2023 showed pulmonary fibrosis with a stable 4.1 cm ascending aortic aneurysm.  He was last seen in the office in 06/2023 noting well-controlled palpitation burden and had transition from warfarin to apixaban .  He was abstaining from further energy drinks.  He comes in today and is doing well from a cardiac perspective, without symptoms of angina or cardiac decompensation.  No dyspnea, dizziness, presyncope, or syncope.  He did note 2 episodes of elevated heart rates around 120 bpm on 11/9 that were short-lived and spontaneously resolved.  He indicates he has recently reintroduced a second cup of coffee and is wondering if this contributed.  He plans to reduce his coffee back to 1/day.  He is adherent to apixaban  and denies any falls or symptoms concerning for bleeding.  No lower extremity swelling or progressive orthopnea.   Labs independently reviewed: 03/2024 - Hgb 14.8, PLT 176, potassium 3.7, BUN 16, serum creatinine 1.1, albumin 4.4, AST/ALT normal 12/2023 -  TSH normal, TC 233, TG 272, HDL 46, LDL 138, A1c 5.7  Past Medical History:  Diagnosis Date   Acquired dilation of ascending aorta and aortic root    a. 03/2022 Echo: Asc Ao 40mm.   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 09/02/2014: no sig ischemia, GI uptake noted, no sig WMA, EF 48%, no EKG chanes concerning for ischemia, low risk scan     HTN (hypertension)    Mediastinal adenopathy 11/02/2013   Normal coronary arteries    a. cardiac cath 12/03/2014: no significant CAD, right dominant system, LVEF >55%, no MR or AS   Paroxysmal atrial flutter (HCC)    a. s/p successful TEE/DCCV on 08/28/2014-->was on xarelto , now on warfarin.   PSVT (paroxysmal supraventricular tachycardia)    a. 03/2022 Zio: Predominantly sinus rhythm at 64 (37-210).  344 SVT runs (fastest 210 x 6 beat, longest 8 beats), 11.5% PAC burden, 1.7% PVC burden.   Pulmonary fibrosis (HCC)    a. 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 Also on cellcept 500 BID [for connective tissue disorder]- also discussed that this should also help his hemolytic anemia.      # History of A. Fib- on Xare   Tachycardia-induced cardiomyopathy (HCC)    a. 07/2014 Echo: EF 20-25%, global HK; b. TEE 08/28/2014: EF 30-35%, no intracardic thrombus, mildly dilated LA/RA, mild TR, mild-mod aortic sclerosis w/o stenosis; c. 09/2014 Echo: EF 55-60%, select images w/ inf HK; d. 10/2015 Echo: EF 60-65%; e. 03/2022 Echo: EF 55-60%, no rwma, GrI DD, nl RV fxn, AoV sclerosis. asc Ao 40mm.   Vasculitis 04/03/2016    Past Surgical History:  Procedure Laterality Date   CARDIAC CATHETERIZATION N/A 12/03/2014   Procedure: Left Heart Cath and Coronary Angiography;  Surgeon: Evalene JINNY Lunger, MD;  Location: [facility];  Service: Cardiovascular;   Laterality: N/A;   COLONOSCOPY WITH PROPOFOL  N/A 11/23/2015   Procedure: COLONOSCOPY WITH PROPOFOL ;  Surgeon: Rogelia Copping, MD;  Location: [facility];  Service: Endoscopy;  Laterality: N/A;   COLONOSCOPY WITH PROPOFOL  N/A 05/04/2017   Procedure: COLONOSCOPY WITH PROPOFOL ;  Surgeon: Copping Rogelia, MD;  Location: [facility];  Service: Endoscopy;  Laterality: N/A;   ESOPHAGOGASTRODUODENOSCOPY (EGD) WITH PROPOFOL  N/A 11/23/2015   Procedure: ESOPHAGOGASTRODUODENOSCOPY (EGD) WITH PROPOFOL ;  Surgeon: Rogelia Copping, MD;  Location: [facility] ENDOSCOPY;  Service: Endoscopy;  Laterality: N/A;   HAND SURGERY Left    PERIPHERAL VASCULAR CATHETERIZATION N/A 11/17/2015   Procedure: Upper Extremity Angiography;  Surgeon: Deatrice DELENA Cage, MD;  Location: [facility];  Service: Cardiovascular;  Laterality: N/A;   POLYPECTOMY  05/04/2017   Procedure: POLYPECTOMY;  Surgeon: Copping Rogelia, MD;  Location: [facility];  Service: Endoscopy;;    Current Medications: Current Meds  Medication Sig   albuterol  (VENTOLIN  HFA) 108 (90 Base) MCG/ACT inhaler Inhale into the lungs as needed.   atorvastatin  (LIPITOR) 40 MG tablet  TAKE 1/2 TABLET EVERY DAY   azaTHIOprine (IMURAN) 50 MG tablet Take 50 mg by mouth daily.   BENLYSTA 200 MG/ML SOAJ Inject 200 mg into the skin once a week.   colchicine  0.6 MG tablet Take 0.6 mg by mouth daily.   [employer] NASAL ALLERGY SPRAY 55 MCG/ACT AERO nasal inhaler SMARTSIG:2 Spray(s) Both Nares Daily   diltiazem  (CARDIZEM  CD) 120 MG 24 hr capsule TAKE 1 CAPSULE TWICE DAILY   ELIQUIS  5 MG TABS tablet TAKE 1 TABLET TWICE DAILY   enalapril  (VASOTEC ) 20 MG tablet Take 1 tablet (20 mg total) by mouth 2 (two) times daily.   febuxostat (ULORIC) 40 MG tablet Take 40 mg by mouth daily.   fluticasone -salmeterol (ADVAIR) 250-50 MCG/ACT AEPB Inhale into the lungs daily.   hydrocortisone 2.5 % cream Apply 1 Application topically 2 (two) times daily.   hydroxychloroquine (PLAQUENIL) 200 MG  tablet Take 1 tablet by mouth 2 (two) times daily.   ketoconazole (NIZORAL) 2 % shampoo Apply topically 3 (three) times a week.   metoprolol  tartrate (LOPRESSOR ) 100 MG tablet TAKE 1 TABLET TWICE DAILY   Multiple Vitamin (MULTI-VITAMINS) TABS Take by mouth.   mycophenolate (CELLCEPT) 500 MG tablet Take 2 tablets by mouth 2 (two) times daily.   pantoprazole  (PROTONIX ) 40 MG tablet Take 40 mg by mouth 2 (two) times daily.   potassium chloride  SA (KLOR-CON  M) 20 MEQ tablet TAKE 1 TABLET EVERY DAY   predniSONE  (DELTASONE ) 5 MG tablet Take 10 mg by mouth daily with breakfast.   sildenafil  (VIAGRA ) 50 MG tablet Take 1-2 tablets (50-100 mg total) by mouth daily as needed for erectile dysfunction.   tadalafil  (CIALIS ) 10 MG tablet Take 1-2 tablets (10-20 mg total) by mouth daily as needed (take 1 hour prior to sexual activity).   tamsulosin  (FLOMAX ) 0.4 MG CAPS capsule TAKE 1 CAPSULE EVERY DAY   torsemide  (DEMADEX ) 20 MG tablet TAKE 1/2 TABLET EVERY DAY    Allergies:   Doxycycline, Allopurinol, and Probenecid   Social History   Socioeconomic History   Marital status: Married    Spouse name: Not on file   Number of children: Not on file   Years of education: Not on file   Highest education level: 12th grade  Occupational History   Not on file  Tobacco Use   Smoking status: Former    Current packs/day: 0.00    Average packs/day: 1 pack/day for 20.0 years (20.0 ttl pk-yrs)    Types: Cigarettes    Start date: 08/21/1978    Quit date: 08/21/1998    Years since quitting: 25.6    Passive exposure: Past   Smokeless tobacco: Never  Vaping Use   Vaping status: Never Used  Substance and Sexual Activity   Alcohol use: No   Drug use: No   Sexual activity: Yes    Birth control/protection: None  Other Topics Concern   Not on file  Social History Narrative   Not on file   Social Drivers of Health   Financial Resource Strain: Low Risk  (01/20/2024)   Overall Financial Resource Strain (CARDIA)     Difficulty of Paying Living Expenses: Not hard at all  Food Insecurity: No Food Insecurity (01/20/2024)   Hunger Vital Sign    Worried About Running Out of Food in the Last Year: Never true    Ran Out of Food in the Last Year: Never true  Transportation Needs: No Transportation Needs (01/20/2024)   PRAPARE - Transportation    Lack  of Transportation (Medical): No    Lack of Transportation (Non-Medical): No  Physical Activity: Insufficiently Active (11/01/2023)   Exercise Vital Sign    Days of Exercise per Week: 3 days    Minutes of Exercise per Session: 30 min  Stress: No Stress Concern Present (11/01/2023)   [employer] of Occupational Health - Occupational Stress Questionnaire    Feeling of Stress : Not at all  Social Connections: Moderately Isolated (11/01/2023)   Social Connection and Isolation Panel    Frequency of Communication with Friends and Family: More than three times a week    Frequency of Social Gatherings with Friends and Family: More than three times a week    Attends Religious Services: Never    Database Administrator or Organizations: No    Attends Banker Meetings: Never    Marital Status: Married     Family History:  The patient's family history includes Leukemia in his mother.  ROS:   12-point review of systems is negative unless otherwise noted in the HPI.   EKGs/Labs/Other Studies Reviewed:    Studies reviewed were summarized above. The additional studies were reviewed today:  2D echo 05/02/2022: 1. Left ventricular ejection fraction, by estimation, is 55 to 60%. The  left ventricle has normal function. The left ventricle has no regional  wall motion abnormalities. Left ventricular diastolic parameters are  consistent with Grade I diastolic  dysfunction (impaired relaxation).   2. Right ventricule is not well visualized, grossly systolic function  appears mildly reduced. The right ventricular size is mildly enlarged.  Tricuspid  regurgitation signal is inadequate for assessing PA pressure.   3. Left atrial size was mildly dilated.   4. The mitral valve is normal in structure. No evidence of mitral valve  regurgitation. No evidence of mitral stenosis.   5. The aortic valve has an indeterminant number of cusps. There is mild  calcification of the aortic valve. Aortic valve regurgitation is not  visualized. Aortic valve sclerosis/calcification is present, without any  evidence of aortic stenosis. Aortic  valve area, by VTI measures 2.82 cm.   6. There is mild dilatation of the ascending aorta, measuring 40 mm.   7. The inferior vena cava is normal in size with greater than 50%  respiratory variability, suggesting right atrial pressure of 3 mmHg.   Comparison(s): Echo from 10/2015 showed an EF of 60 to 65%, no regional  wall motion abnormalities, normal LV diastolic function parameters, normal  RV systolic function and PASP. During the study, frequent PACs and PVCs  were noted.  __________   Zio patch 03/2022: Normal sinus rhythm Patient had a min HR of 37 bpm, max HR of 210 bpm, and avg HR of 64 bpm.    344 Supraventricular Tachycardia runs occurred, the run with the fastest interval lasting 6 beats with a max rate of 210 bpm, the longest lasting 8 beats with an avg rate of 123 bpm.    Isolated SVEs were frequent (11.5%, [id number]), SVE Couplets were occasional (2.6%, 17179), and SVE Triplets were rare (<1.0%, 4282).    Isolated VEs were occasional (1.7%, 22282), VE Couplets were rare (<1.0%, 27), and VE Triplets were rare (<1.0%, 1).  Ventricular Bigeminy and Trigeminy were present.   Patient triggered events (2), associated with rare PACs __________   2D echo 11/22/2015: - Left ventricle: The cavity size was normal. Systolic function was    normal. The estimated ejection fraction was in the range of 60%  to 65%. Wall motion was normal; there were no regional wall    motion abnormalities. Left ventricular  diastolic function    parameters were normal.  - Left atrium: The atrium was at the upper limits of normal in    size.  - Right ventricle: Systolic function was normal.  - Pulmonary arteries: Systolic pressure was within the normal    range.   Impressions:   - Challenging image quality. Frequent APCs and PVCs.  __________   Upper extremity angiography 11/17/2015: 1. 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  2. Beading appearance of left ulnar and radial arteries suggestive of a systemic process (No improvement with NTG). ? Vasculitis.    Recommendations: Although the ulnar artery is occluded distally, the fingers are getting normal flow via the radial artery with an intact hand arch. Thus, there is no benefit of opening the ulnar artery which is very small anyway with what seems to be a systemic process. ( I reviewed the angiogram with Dr. Serene for a second opinion) . Continue wound care. Recommend rheumatology consult. __________   Upper extremity arterial duplex 11/04/2015: Summary:  The ulnar artery appears small with atypical waveform. Subclavian,  axillary, brachial, and radial arteries of the left upper extremity  appear patent with normal waveforms. Arterial flow noted in left  palmar arch.  __________   Outpatient cardiac monitoring 11/2014: Shows episodes of PVCs in a bigeminal manner. On 7/20, 7/22, 7/25, 12/27/14 He was asymptomatic.  Otherwise no other significant arrhythmia __________   [facility] 12/03/2014: Right dominant coronary arterial system No significant coronary artery disease Normal LV gram, ejection fraction estimated at greater than 55% No significant MR, aortic valve stenosis   Etiology of his chest pain is likely atypical in nature Shortness of breath likely secondary to underlying pulmonary fibrosis Medical management recommended __________   2D echo 10/05/2014: - Left ventricle: The  cavity size was normal. Systolic function was    normal. The estimated ejection fraction was in the range of 55%    to 60%. Select images suggestive of hypokinesis of the inferior    and posterior myocardium. Left ventricular diastolic function    parameters were normal.  - Left atrium: The atrium was normal in size.  - Right ventricle: Systolic function was normal.  - Pulmonary arteries: Systolic pressure was within the normal    range.    EKG:  EKG is ordered today.  The EKG ordered today demonstrates NSR, 71 bpm, incomplete RBBB, consistent with prior tracing  Recent Labs: 07/10/2023: ALT 23; BUN 16; Creatinine, Ser 1.03; Potassium 3.3; Sodium 140 01/24/2024: Hemoglobin 15.1; Platelets 168; TSH 2.360  Recent Lipid Panel    Component Value Date/Time   CHOL 233 (H) 01/24/2024 0950   CHOL 88 08/25/2014 0110   TRIG 272 (H) 01/24/2024 0950   TRIG 124 08/25/2014 0110   HDL 46 01/24/2024 0950   HDL 24 (L) 08/25/2014 0110   CHOLHDL 5.1 (H) 01/24/2024 0950   CHOLHDL 4.4 11/29/2022 1137   VLDL 32 11/29/2022 1137   VLDL 25 08/25/2014 0110   LDLCALC 138 (H) 01/24/2024 0950   LDLCALC 39 08/25/2014 0110    PHYSICAL EXAM:    VS:  BP 120/80 (BP Location: Left Arm, Patient Position: Sitting, Cuff Size: Normal)   Pulse 71 Comment: 70 Oximeter  Ht 5' 10 (1.778 m)   Wt 233 lb (105.7 kg)   SpO2 98%  BMI 33.43 kg/m   BMI: Body mass index is 33.43 kg/m.  Physical Exam Vitals reviewed.  Constitutional:      Appearance: He is well-developed.  HENT:     Head: Normocephalic and atraumatic.  Eyes:     General:        Right eye: No discharge.        Left eye: No discharge.  Cardiovascular:     Rate and Rhythm: Normal rate and regular rhythm.     Heart sounds: Normal heart sounds, S1 normal and S2 normal. Heart sounds not distant. No midsystolic click and no opening snap. No murmur heard.    No friction rub.  Pulmonary:     Effort: Pulmonary effort is normal. No respiratory distress.      Breath sounds: Normal breath sounds. No decreased breath sounds, wheezing, rhonchi or rales.  Musculoskeletal:     Cervical back: Normal range of motion.     Right lower leg: No edema.     Left lower leg: No edema.  Skin:    General: Skin is warm and dry.     Nails: There is no clubbing.  Neurological:     Mental Status: He is alert and oriented to person, place, and time.  Psychiatric:        Speech: Speech normal.        Behavior: Behavior normal.        Thought Content: Thought content normal.        Judgment: Judgment normal.     Wt Readings from Last 3 Encounters:  04/07/24 233 lb (105.7 kg)  01/24/24 231 lb (104.8 kg)  01/22/24 230 lb (104.3 kg)     ASSESSMENT & PLAN:   Paroxysmal atrial flutter: Maintaining sinus rhythm on Lopressor  100 mg twice daily.  CHADS2VASc 3 (CHF, HTN, vascular disease).  Remains on apixaban  5 mg twice daily and is without symptoms concerning for bleeding or falls.  Recent labs stable.  Nonsustained PSVT: He does note some paroxysms of palpitations following increased caffeine usage.  Will reduce coffee back to 1 cup daily.  Remains on Lopressor  as above.  HFimpEF: Euvolemic and well compensated.  His weight is down 20 pounds today when compared to his last visit.  This is intentional.  He remains on enalapril  20 mg twice daily, Lopressor  100 mg twice daily, and torsemide  10 mg daily.  Recent labs stable.  Ascending thoracic aortic aneurysm: Stable by high-resolution chest CT in 03/2023, measuring 4.1 cm.  Obtain CTA aorta.  Maintain optimal blood pressure control.  Aortic valve previously documented to be trileaflet by echo in 2017.  HTN: Blood pressure is well-controlled in the office today.  He remains on Cardizem  CD 120 mg twice daily, Lopressor  100 mg twice daily, and enalapril  20 mg twice daily.  Coronary artery calcification/HLD: No symptoms suggestive of angina.  LDL 138 in 12/2023 with target LDL less than 70.  Remains on titrated dose  of atorvastatin  40 mg.  Future orders placed for fasting lipid panel and AST/ALT with recommendation to escalate lipid-lowering therapy as indicated to achieve target LDL.  Chronic hypoxic respiratory failure with pulmonary sarcoidosis and fibrosis: Stable.  Follow-up with pulmonology as directed.  Discuss cardiac MRI at next visit.     Disposition: F/u with Dr. Gollan or an APP after CTA aorta.   Medication Adjustments/Labs and Tests Ordered: Current medicines are reviewed at length with the patient today.  Concerns regarding medicines are outlined above. Medication changes, Labs and Tests  ordered today are summarized above and listed in the Patient Instructions accessible in Encounters.   Signed, Bernardino Bring, PA-C 04/07/2024 3:40 PM     [facility] [place] [address] [place], [place] [address] [phone]

## 2024-04-07 NOTE — Patient Instructions (Signed)
 Medication Instructions:  Your physician recommends that you continue on your current medications as directed. Please refer to the Current Medication list given to you today.   *If you need a refill on your cardiac medications before your next appointment, please call your pharmacy*  Lab Work: Your provider would like for you to return in 1 week to have the following labs drawn: Lipid panel and AST/ALT.   Please go to [facility] [address] ([facility] [address], [place] [address] You do not need an appointment.  They are open from 8 am- 4:30 pm.  Lunch from 1:00 pm- 2:00 pm You DO need to be fasting.   You may also go to one of the following [facility]:  [address] [place], [place] [address] Phone: [phone] Lab hours: Mon-Fri 8 am- 5 pm    Lunch 12 pm- 1 pm  [address] [place],  [place]  [address]  [place]  Phone: [phone] Lab hours: 7 am- 4 pm Lunch 12 pm-1 pm   [address] [place],  [place]  [address]  [place]  Phone: [phone] Lab hours: Mon-Fri 8 am- 5 pm    Lunch 12 pm- 1 pm  If you have labs (blood work) drawn today and your tests are completely normal, you will receive your results only by: MyChart Message (if you have MyChart) OR A paper copy in the mail If you have any lab test that is abnormal or we need to change your treatment, we will call you to review the results.  Testing/Procedures: CT Angiography (CTA) chest/aorta, is a special type of CT scan that uses a computer to produce multi-dimensional views of major blood vessels throughout the body. In CT angiography, a contrast material is injected through an IV to help visualize the blood vessels  Nothing to eat or drink 4 hours prior to test  [facility] [address]  [place], [place] [address]   Follow-Up: At [facility], you and your health needs are our priority.  As part of our continuing mission to provide you with  exceptional heart care, our providers are all part of one team.  This team includes your primary Cardiologist (physician) and Advanced Practice Providers or APPs (Physician Assistants and Nurse Practitioners) who all work together to provide you with the care you need, when you need it.  Your next appointment:   After CTA Aorta  Provider:   You may see Timothy Gollan, MD or Bernardino Bring, PA-C

## 2024-04-11 ENCOUNTER — Ambulatory Visit
Admission: RE | Admit: 2024-04-11 | Discharge: 2024-04-11 | Disposition: A | Discharge status: Home / Self Care | Source: Ambulatory Visit | Attending: Physician Assistant | Admitting: Physician Assistant

## 2024-04-11 ENCOUNTER — Other Ambulatory Visit
Admission: RE | Admit: 2024-04-11 | Discharge: 2024-04-11 | Disposition: A | Discharge status: Home / Self Care | Attending: Physician Assistant | Admitting: Physician Assistant

## 2024-04-11 DIAGNOSIS — E785 Hyperlipidemia, unspecified: Secondary | ICD-10-CM | POA: Insufficient documentation

## 2024-04-11 DIAGNOSIS — I7121 Aneurysm of the ascending aorta, without rupture: Secondary | ICD-10-CM | POA: Insufficient documentation

## 2024-04-11 DIAGNOSIS — E041 Nontoxic single thyroid nodule: Secondary | ICD-10-CM | POA: Diagnosis not present

## 2024-04-11 DIAGNOSIS — Z79899 Other long term (current) drug therapy: Secondary | ICD-10-CM | POA: Insufficient documentation

## 2024-04-11 DIAGNOSIS — J8410 Pulmonary fibrosis, unspecified: Secondary | ICD-10-CM | POA: Diagnosis not present

## 2024-04-11 LAB — LIPID PANEL
Cholesterol: 171 mg/dL (ref 0–200)
HDL: 41 mg/dL (ref >40)
LDL Cholesterol: 88 mg/dL (ref 0–99)
Total CHOL/HDL Ratio: 4.2 ratio
Triglycerides: 210 mg/dL — ABNORMAL HIGH (ref <150)
VLDL: 42 mg/dL — ABNORMAL HIGH (ref 0–40)

## 2024-04-11 LAB — AST: AST: 28 U/L (ref 15–41)

## 2024-04-11 LAB — ALT: ALT: 18 U/L (ref 0–44)

## 2024-04-11 MED ORDER — IOHEXOL 350 MG/ML SOLN
75.0000 mL | Freq: Once | INTRAVENOUS | Status: AC | PRN
  Administered 2024-04-11: 75 mL via INTRAVENOUS

## 2024-04-12 ENCOUNTER — Ambulatory Visit: Payer: Self-pay | Admitting: Physician Assistant

## 2024-04-12 DIAGNOSIS — E782 Mixed hyperlipidemia: Secondary | ICD-10-CM

## 2024-04-12 DIAGNOSIS — Z79899 Other long term (current) drug therapy: Secondary | ICD-10-CM

## 2024-04-13 ENCOUNTER — Ambulatory Visit: Payer: Self-pay | Admitting: Physician Assistant

## 2024-04-16 DIAGNOSIS — D869 Sarcoidosis, unspecified: Secondary | ICD-10-CM | POA: Diagnosis not present

## 2024-04-16 DIAGNOSIS — R0602 Shortness of breath: Secondary | ICD-10-CM | POA: Diagnosis not present

## 2024-04-16 DIAGNOSIS — J8410 Pulmonary fibrosis, unspecified: Secondary | ICD-10-CM | POA: Diagnosis not present

## 2024-04-16 DIAGNOSIS — I7121 Aneurysm of the ascending aorta, without rupture: Secondary | ICD-10-CM | POA: Diagnosis not present

## 2024-04-23 ENCOUNTER — Other Ambulatory Visit: Payer: Self-pay | Admitting: Emergency Medicine

## 2024-04-23 DIAGNOSIS — E782 Mixed hyperlipidemia: Secondary | ICD-10-CM

## 2024-04-23 DIAGNOSIS — Z79899 Other long term (current) drug therapy: Secondary | ICD-10-CM

## 2024-04-23 MED ORDER — ATORVASTATIN CALCIUM 80 MG PO TABS: 80.0000 mg | ORAL_TABLET | Freq: Every day | ORAL | 3 refills | Status: AC

## 2024-04-30 ENCOUNTER — Ambulatory Visit: Admitting: Urology

## 2024-04-30 VITALS — BP 112/74 | HR 79 | Wt 228.0 lb

## 2024-04-30 DIAGNOSIS — Z125 Encounter for screening for malignant neoplasm of prostate: Secondary | ICD-10-CM | POA: Diagnosis not present

## 2024-04-30 DIAGNOSIS — N432 Other hydrocele: Secondary | ICD-10-CM

## 2024-04-30 DIAGNOSIS — N138 Other obstructive and reflux uropathy: Secondary | ICD-10-CM | POA: Diagnosis not present

## 2024-04-30 DIAGNOSIS — N401 Enlarged prostate with lower urinary tract symptoms: Secondary | ICD-10-CM | POA: Diagnosis not present

## 2024-04-30 DIAGNOSIS — N529 Male erectile dysfunction, unspecified: Secondary | ICD-10-CM | POA: Diagnosis not present

## 2024-04-30 MED ORDER — TAMSULOSIN HCL 0.4 MG PO CAPS: 0.4000 mg | ORAL_CAPSULE | Freq: Every day | ORAL | 3 refills | Status: AC

## 2024-04-30 MED ORDER — TADALAFIL 10 MG PO TABS: 10.0000 mg | ORAL_TABLET | Freq: Every day | ORAL | 6 refills | Status: AC | PRN

## 2024-04-30 NOTE — Patient Instructions (Signed)
 Hydrocele, Adult A hydrocele is a collection of fluid in the loose pouch of skin that holds the testicles (scrotum). It can occur in one or both testicles. This may happen because: The amount of fluid produced in the scrotum is not absorbed by the rest of the body. Fluid from the abdomen fills the scrotum. Normally, the testicles develop in the abdomen and then drop into the scrotum before birth. The tube that the testicles travel through usually closes after the testicles drop. If the tube does not close, fluid from the abdomen can fill the scrotum. This is not very common in adults. What are the causes? A hydrocele may be caused by: An injury to the scrotum. An infection. Decreased blood flow to the scrotum. Twisting of a testicle (testicular torsion). A birth defect. A tumor or cancer of the testicle. Sometimes, the cause is not known. What are the signs or symptoms? A hydrocele feels like a water -filled balloon. It may also feel heavy. Other symptoms include: Swelling of the scrotum. The swelling may decrease when you lie down. You may also notice more swelling at night than in the morning. This is called a communicating hydrocele, in which the fluid in the scrotum goes back into the abdominal cavity when the position of the scrotum changes. Swelling of the groin. Mild discomfort in the scrotum. Pain. This can develop if the hydrocele was caused by infection or twisting. The larger the hydrocele, the more likely you are to have pain. Swelling may also cause pain. How is this diagnosed? This condition may be diagnosed based on a physical exam and your medical history. You may also have tests, including: Imaging tests, such as an ultrasound. A transillumination test. This test takes place in a dark room where a light is placed on the skin of the scrotum. Clear liquid will not impede the light and the scrotum will be illuminated. This helps a health care provider distinguish a hydrocele from a  tumor. Blood or urine tests. How is this treated? Most hydroceles go away on their own. If you have no discomfort or pain, your health care provider may suggest close monitoring of your condition until the condition goes away or symptoms develop. This is called watch and wait or watchful waiting. If treatment is needed, it may include: Treating an underlying condition. This may include taking an antibiotic medicine to treat an infection. Having surgery to stop fluid from collecting in the scrotum. Having surgery to drain the fluid. Surgery may include: Hydrocelectomy. For this procedure, an incision is made in the scrotum to remove the fluid. Needle aspiration. A needle is used to drain fluid. However, the fluid buildup will come back quickly and may lead to an infection of the scrotum. This is rarely done. Follow these instructions at home: Medicines Take over-the-counter and prescription medicines only as told by your health care provider. If you were prescribed an antibiotic medicine, take it as told by your health care provider. Do not stop taking the antibiotic even if you start to feel better. General instructions Watch the hydrocele for any changes. Keep all follow-up visits. This is important. Contact a health care provider if: You notice any changes in the hydrocele. The swelling in your scrotum or groin gets worse. The hydrocele becomes red, firm, painful, or tender to the touch. You have a fever. Get help right away if you: Develop a lot of pain or your pain becomes worse. Have chills. Have a high fever. Summary A hydrocele is  a collection of fluid in the loose pouch of skin that holds the testicles (scrotum). A hydrocele can cause swelling, discomfort, and pain. In adults, the cause of a hydrocele may not be known. However, it is sometimes caused by an infection or the twisting of a testicle. Hydroceles often go away on their own. If a hydrocele causes pain, treating the  underlying cause may be needed to ease the pain. This information is not intended to replace advice given to you by your health care provider. Make sure you discuss any questions you have with your health care provider. Document Revised: 12/30/2020 Document Reviewed: 12/30/2020 Elsevier Patient Education  2025 [employer].

## 2024-04-30 NOTE — Progress Notes (Signed)
   04/30/2024 12:25 PM   [name] 01/07/1961 [id number]  Reason for visit: Right scrotal swelling, elevated PSA, prostate nodule, ED, BPH  History: Number of comorbidities including obesity with BMI of 33, A-fib on anticoagulation, diuretic use Prostate nodule per urology PA and they ordered a prostate MRI in 2022 which was benign, PSA was normal at 2.7 Isolated elevated PSA up to 10.9 at follow-up that decreased to 4.2 and recheck with reassuring elevated percentage free and he deferred biopsy.  Due for PHI score ED responsive to see LUTS 10 to 20 mg on demand Right sided hydrocele confirmed on prior scrotal ultrasound On Flomax  nightly for BPH with excellent results  Physical Exam: BP 112/74 (BP Location: Left Arm, Patient Position: Sitting, Cuff Size: Normal)   Pulse 79   Wt 228 lb (103.4 kg)   BMI 32.71 kg/m  Moderate size right hydrocele, nontender, no testicular masses noted  Imaging/labs: I personally viewed and interpreted the prostate MRI from February 2022 showing a 44 g prostate with no suspicious lesions Personally viewed and interpreted the scrotal ultrasound October 2022 showing moderate size right hydrocele  Today: Primary reason for today's visit is some increase in his right scrotal swelling, this is not bothersome but he wanted to have it checked out Cialis  working well for ED, Flomax  working well for BPH Due for PHI score today  Plan:   Elevated PSA: History of negative prostate MRI, low PSA density, will check PHI score today and call with results.  Risks and benefits of screening were reviewed today Right scrotal swelling: Reassurance provided regarding mild to moderate hydrocele, discussed options including observation, percutaneous aspiration, or hydrocelectomy, and he opts for observation ED: Well-controlled on Cialis , refilled BPH: Well-controlled on Flomax , refilled Call with PHI score results, can space to yearly follow-up for his  multitude of urologic issues if stable   Devin JAYSON Burnet, MD  [facility] [address] [place], [place] [address] [phone]

## 2024-05-02 LAB — PHI SCORE REFLEX
% Free PSA: 32.9 %
PSA, Free: 1.57 ng/mL
Prostate Heath Index Score: 36.2
p2PSA: 26 pg/mL

## 2024-05-02 LAB — PROSTATE HEALTH INDEX: Prostate Specific Ag: 4.8 ng/mL — ABNORMAL HIGH (ref 0.0–3.9)

## 2024-05-06 ENCOUNTER — Encounter: Payer: Self-pay | Admitting: Internal Medicine

## 2024-05-06 ENCOUNTER — Ambulatory Visit: Payer: Self-pay | Admitting: Urology

## 2024-05-06 ENCOUNTER — Other Ambulatory Visit: Payer: Self-pay

## 2024-05-06 MED ORDER — PANTOPRAZOLE SODIUM 40 MG PO TBEC: 40.0000 mg | DELAYED_RELEASE_TABLET | Freq: Two times a day (BID) | ORAL | 0 refills | Status: DC

## 2024-05-08 NOTE — Progress Notes (Signed)
 Cardiology Office Note    Date:  05/13/2024   ID:  [name], DOB [birth year], MRN [id number]  PCP:  Justus Leita DEL, MD  Cardiologist:  Evalene Lunger, MD  Electrophysiologist:  None   Chief Complaint: Follow up  History of Present Illness:   [name] is a 63 y.o. male with history of coronary artery calcification noted on CT imaging, atrial flutter on apixaban  with tachycardia mediated cardiomyopathy with subsequent improvement in LV systolic function, PSVT, ascending thoracic aortic aneurysm, chronic hypoxic respiratory failure on supplemental oxygen with pulmonary sarcoidosis and fibrosis, discoid lupus, rheumatoid arthritis, HTN, anemia, and GERD who presents for follow-up of CTA of the aorta.  He was initially found to be in atrial flutter in 2016, at which time his EF was 20 to 25%.  Ischemic evaluation was negative.  He subsequently underwent TEE guided DCCV in 08/2014.  TEE at that time showed an EF of 30 to 35% with mild to moderate aortic valve sclerosis without evidence of stenosis or (tricuspid aortic valve).  Outpatient cardiac monitoring showed sinus rhythm with frequent PVCs and no further evidence of atrial flutter.  Following cardioversion, echo in 09/2014 showed an EF of 55 to 60%.   LHC in 11/2014 showed no significant CAD with an EF greater than 55%.  He was admitted to the hospital in 10/2015 with weakness and fall with a hemoglobin of 5.5 requiring multiple units of PRBC.  GI evaluation was unrevealing.  Further evaluation by hematology confirmed hemolytic anemia on prednisone .  Echo from 10/2015 showed an EF of 60 to 65%, no regional wall motion abnormalities, normal LV diastolic function parameters, normal RV systolic function and PASP.  During the study, frequent PACs and PVCs were noted.  Echo from 04/2022 showed an EF of 55 to 60%, no regional wall motion abnormalities, grade 1 diastolic dysfunction, poorly visualized RV with systolic function  appearing mildly reduced, mildly enlarged RV cavity size, mildly dilated left atrium, aortic valve sclerosis without evidence of stenosis, and mild dilatation of the ascending aorta measuring 40 mm.  Zio patch at that time showed a predominant rhythm of sinus with an average rate of 64 bpm, 344 episodes of SVT lasting up to 8 beats, frequent PACs with an overall burden of 11.5%, and an occasional PVCs with an overall burden of 1.7%.  In this setting, metoprolol  was titrated to 100 mg daily and in follow-up diltiazem  was increased to 120 mg twice daily.  CT chest in 03/2023 showed pulmonary fibrosis with a stable 4.1 cm ascending aortic aneurysm.  He was seen in the office in 06/2023 noting well-controlled palpitation burden and had transitioned from warfarin to apixaban .  He was abstaining from further energy drinks.    He was most recently seen in the office in 03/2024 and reported 2 brief episodes of palpitations after reintroducing a second cup of coffee.  No changes in pharmacotherapy were indicated at that time.  Follow up CTA of the aorta in 03/2024 showed ascending thoracic aorta measuring 3.9 cm in maximum diameter which was within normal limits as well as stable chronic pulmonary interstitial fibrosis and bronchiectasis and a stable 3 cm right thyroid  lobe nodule.  He comes in doing well from a cardiac perspective and is without symptoms of angina or cardiac decompensation.  No significant dyspnea, dizziness, presyncope, or syncope.  No lower extremity swelling or progressive orthopnea.  Palpitation burden has improved with reduction in coffee intake.  Has tolerated the titration of atorvastatin   to 80 mg without issue.  He does not have any acute cardiac concerns at this time.   Labs independently reviewed: 03/2024 -TC 171, TG 210, HDL 41, LDL 88, AST/ALT normal, Hgb 14.8, PLT 176, potassium 3.7, BUN 16, serum creatinine 1.1, albumin 4.4 12/2023 - TSH normal, A1c 5.7  Past Medical History:   Diagnosis Date   Acquired dilation of ascending aorta and aortic root    a. 03/2022 Echo: Asc Ao 40mm.   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 09/02/2014: no sig ischemia, GI uptake noted, no sig WMA, EF 48%, no EKG chanes concerning for ischemia, low risk scan     HTN (hypertension)    Mediastinal adenopathy 11/02/2013   Normal coronary arteries    a. cardiac cath 12/03/2014: no significant CAD, right dominant system, LVEF >55%, no MR or AS   Paroxysmal atrial flutter (HCC)    a. s/p successful TEE/DCCV on 08/28/2014-->was on xarelto , now on warfarin.   PSVT (paroxysmal supraventricular tachycardia)    a. 03/2022 Zio: Predominantly sinus rhythm at 64 (37-210).  344 SVT runs (fastest 210 x 6 beat, longest 8 beats), 11.5% PAC burden, 1.7% PVC burden.   Pulmonary fibrosis (HCC)    a. 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 Also on cellcept 500 BID [for connective tissue disorder]- also discussed that this should also help his hemolytic anemia.      # History of A. Fib- on Xare   Tachycardia-induced cardiomyopathy (HCC)    a. 07/2014 Echo: EF 20-25%, global HK; b. TEE 08/28/2014: EF 30-35%, no intracardic thrombus, mildly dilated LA/RA, mild TR, mild-mod aortic sclerosis w/o stenosis; c. 09/2014 Echo: EF 55-60%, select images w/ inf HK; d. 10/2015 Echo: EF 60-65%; e. 03/2022 Echo: EF 55-60%, no rwma, GrI DD, nl RV fxn, AoV sclerosis. asc Ao 40mm.   Vasculitis 04/03/2016     Past Surgical History:  Procedure Laterality Date   CARDIAC CATHETERIZATION N/A 12/03/2014   Procedure: Left Heart Cath and Coronary Angiography;  Surgeon: Evalene JINNY Lunger, MD;  Location: [facility];  Service: Cardiovascular;  Laterality: N/A;   COLONOSCOPY WITH PROPOFOL  N/A 11/23/2015   Procedure: COLONOSCOPY WITH PROPOFOL ;  Surgeon: Rogelia Copping, MD;  Location: [facility];  Service: Endoscopy;  Laterality: N/A;   COLONOSCOPY WITH PROPOFOL  N/A 05/04/2017   Procedure: COLONOSCOPY WITH PROPOFOL ;  Surgeon: Copping Rogelia, MD;  Location: [facility];  Service: Endoscopy;  Laterality: N/A;   ESOPHAGOGASTRODUODENOSCOPY (EGD) WITH PROPOFOL  N/A 11/23/2015   Procedure: ESOPHAGOGASTRODUODENOSCOPY (EGD) WITH PROPOFOL ;  Surgeon: Rogelia Copping, MD;  Location: [facility];  Service: Endoscopy;  Laterality: N/A;   HAND SURGERY Left    PERIPHERAL VASCULAR CATHETERIZATION N/A 11/17/2015   Procedure: Upper Extremity Angiography;  Surgeon: Deatrice DELENA Cage, MD;  Location: [facility];  Service: Cardiovascular;  Laterality: N/A;   POLYPECTOMY  05/04/2017   Procedure: POLYPECTOMY;  Surgeon: Copping Rogelia, MD;  Location: [facility];  Service: Endoscopy;;    Current Medications: Active Medications[1]  Allergies:   Doxycycline, Allopurinol, and Probenecid   Social History   Socioeconomic History   Marital status: Married    Spouse name: Not on file   Number of children: Not on file   Years of education: Not on file   Highest education level: 12th grade  Occupational History   Not on file  Tobacco Use   Smoking status: Former    Current packs/day: 0.00    Average packs/day: 1 pack/day for 20.0 years (20.0 ttl pk-yrs)    Types: Cigarettes    Start date: 08/21/1978    Quit date: 08/21/1998    Years since quitting: 25.7    Passive exposure: Past   Smokeless tobacco: Never  Vaping Use   Vaping status: Never Used  Substance and Sexual Activity   Alcohol use: No    Drug use: No   Sexual activity: Yes    Birth control/protection: None  Other Topics Concern   Not on file  Social History Narrative   Not on file   Social Drivers of Health   Tobacco Use: Medium Risk (05/13/2024)   Patient History    Smoking Tobacco Use: Former    Smokeless Tobacco Use: Never    Passive Exposure: Past  Physicist, Medical Strain: Low Risk (01/20/2024)   Overall Financial Resource Strain (CARDIA)    Difficulty of Paying Living Expenses: Not hard at all  Food Insecurity: No Food Insecurity (01/20/2024)   Epic    Worried About Radiation Protection Practitioner of Food in the Last Year: Never true    Ran Out of Food in the Last Year: Never true  Transportation Needs: No Transportation Needs (01/20/2024)   Epic    Lack of Transportation (Medical): No    Lack of Transportation (Non-Medical): No  Physical Activity: Insufficiently Active (11/01/2023)   Exercise Vital Sign    Days of Exercise per Week: 3 days    Minutes of Exercise per Session: 30 min  Stress: No Stress Concern Present (11/01/2023)   [employer] of Occupational Health - Occupational Stress Questionnaire    Feeling of Stress : Not at all  Social Connections: Moderately Isolated (11/01/2023)   Social Connection and Isolation Panel    Frequency of Communication with Friends and Family: More than three times a week    Frequency of Social Gatherings with Friends and Family: More than three times a week    Attends Religious Services: Never    Database Administrator or Organizations: No    Attends Banker Meetings: Never    Marital Status: Married  Depression (PHQ2-9): Low Risk (01/24/2024)   Depression (PHQ2-9)    PHQ-2 Score: 0  Alcohol Screen: Low Risk (11/01/2023)   Alcohol Screen    Last Alcohol Screening Score (AUDIT): 0  Housing: Unknown (01/20/2024)   Epic    Unable to Pay for Housing in the Last Year: No    Number of Times Moved in the Last Year: Not on file    Homeless in the Last Year: No  Utilities:  Not At Risk (11/01/2023)   AHC Utilities    Threatened with loss of utilities: No  Health Literacy: Adequate Health Literacy (11/01/2023)   B1300 Health Literacy    Frequency of need for help with medical instructions: Never     Family History:  The patient's family history includes Leukemia in his mother.  ROS:   12-point review of systems  is negative unless otherwise noted in the HPI.   EKGs/Labs/Other Studies Reviewed:    Studies reviewed were summarized above. The additional studies were reviewed today:  CTA aorta 04/11/2024: IMPRESSION: Ascending thoracic aorta measures 3.9 cm in maximum diameter, which is within normal limits. No evidence of thoracic aortic aneurysm or dissection.   Stable chronic pulmonary interstitial fibrosis and bronchiectasis.   Stable 3 cm right thyroid  lobe nodule. Consider thyroid  ultrasound if not previously evaluated. ___________  2D echo 05/02/2022: 1. Left ventricular ejection fraction, by estimation, is 55 to 60%. The  left ventricle has normal function. The left ventricle has no regional  wall motion abnormalities. Left ventricular diastolic parameters are  consistent with Grade I diastolic  dysfunction (impaired relaxation).   2. Right ventricule is not well visualized, grossly systolic function  appears mildly reduced. The right ventricular size is mildly enlarged.  Tricuspid regurgitation signal is inadequate for assessing PA pressure.   3. Left atrial size was mildly dilated.   4. The mitral valve is normal in structure. No evidence of mitral valve  regurgitation. No evidence of mitral stenosis.   5. The aortic valve has an indeterminant number of cusps. There is mild  calcification of the aortic valve. Aortic valve regurgitation is not  visualized. Aortic valve sclerosis/calcification is present, without any  evidence of aortic stenosis. Aortic  valve area, by VTI measures 2.82 cm.   6. There is mild dilatation of the ascending  aorta, measuring 40 mm.   7. The inferior vena cava is normal in size with greater than 50%  respiratory variability, suggesting right atrial pressure of 3 mmHg.   Comparison(s): Echo from 10/2015 showed an EF of 60 to 65%, no regional  wall motion abnormalities, normal LV diastolic function parameters, normal  RV systolic function and PASP. During the study, frequent PACs and PVCs  were noted.  __________   Zio patch 03/2022: Normal sinus rhythm Patient had a min HR of 37 bpm, max HR of 210 bpm, and avg HR of 64 bpm.    344 Supraventricular Tachycardia runs occurred, the run with the fastest interval lasting 6 beats with a max rate of 210 bpm, the longest lasting 8 beats with an avg rate of 123 bpm.    Isolated SVEs were frequent (11.5%, [id number]), SVE Couplets were occasional (2.6%, 17179), and SVE Triplets were rare (<1.0%, 4282).    Isolated VEs were occasional (1.7%, 22282), VE Couplets were rare (<1.0%, 27), and VE Triplets were rare (<1.0%, 1).  Ventricular Bigeminy and Trigeminy were present.   Patient triggered events (2), associated with rare PACs __________   2D echo 11/22/2015: - Left ventricle: The cavity size was normal. Systolic function was    normal. The estimated ejection fraction was in the range of 60%    to 65%. Wall motion was normal; there were no regional wall    motion abnormalities. Left ventricular diastolic function    parameters were normal.  - Left atrium: The atrium was at the upper limits of normal in    size.  - Right ventricle: Systolic function was normal.  - Pulmonary arteries: Systolic pressure was within the normal    range.   Impressions:   - Challenging image quality. Frequent APCs and PVCs.  __________   Upper extremity angiography 11/17/2015: 1. 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  2. Beading appearance of left ulnar and radial arteries suggestive  of a systemic  process (No improvement with NTG). ? Vasculitis.    Recommendations: Although the ulnar artery is occluded distally, the fingers are getting normal flow via the radial artery with an intact hand arch. Thus, there is no benefit of opening the ulnar artery which is very small anyway with what seems to be a systemic process. ( I reviewed the angiogram with Dr. Serene for a second opinion) . Continue wound care. Recommend rheumatology consult. __________   Upper extremity arterial duplex 11/04/2015: Summary:  The ulnar artery appears small with atypical waveform. Subclavian,  axillary, brachial, and radial arteries of the left upper extremity  appear patent with normal waveforms. Arterial flow noted in left  palmar arch.  __________   Outpatient cardiac monitoring 11/2014: Shows episodes of PVCs in a bigeminal manner. On 7/20, 7/22, 7/25, 12/27/14 He was asymptomatic.  Otherwise no other significant arrhythmia __________   [facility] 12/03/2014: Right dominant coronary arterial system No significant coronary artery disease Normal LV gram, ejection fraction estimated at greater than 55% No significant MR, aortic valve stenosis   Etiology of his chest pain is likely atypical in nature Shortness of breath likely secondary to underlying pulmonary fibrosis Medical management recommended __________   2D echo 10/05/2014: - Left ventricle: The cavity size was normal. Systolic function was    normal. The estimated ejection fraction was in the range of 55%    to 60%. Select images suggestive of hypokinesis of the inferior    and posterior myocardium. Left ventricular diastolic function    parameters were normal.  - Left atrium: The atrium was normal in size.  - Right ventricle: Systolic function was normal.  - Pulmonary arteries: Systolic pressure was within the normal    range.    EKG:  EKG is ordered today.  The EKG ordered today demonstrates NSR, 63 bpm, left axis deviation, rare PVC, incomplete  RBBB with pulmonary disease pattern LVH, no acute ST-T changes  Recent Labs: 07/10/2023: BUN 16; Creatinine, Ser 1.03; Potassium 3.3; Sodium 140 01/24/2024: Hemoglobin 15.1; Platelets 168; TSH 2.360 04/11/2024: ALT 18  Recent Lipid Panel    Component Value Date/Time   CHOL 171 04/11/2024 1000   CHOL 233 (H) 01/24/2024 0950   CHOL 88 08/25/2014 0110   TRIG 210 (H) 04/11/2024 1000   TRIG 124 08/25/2014 0110   HDL 41 04/11/2024 1000   HDL 46 01/24/2024 0950   HDL 24 (L) 08/25/2014 0110   CHOLHDL 4.2 04/11/2024 1000   VLDL 42 (H) 04/11/2024 1000   VLDL 25 08/25/2014 0110   LDLCALC 88 04/11/2024 1000   LDLCALC 138 (H) 01/24/2024 0950   LDLCALC 39 08/25/2014 0110    PHYSICAL EXAM:    VS:  BP 118/80 (BP Location: Left Arm, Patient Position: Sitting, Cuff Size: Normal)   Pulse 63   Ht 5' 9 (1.753 m)   Wt 234 lb 12.8 oz (106.5 kg)   SpO2 97%   BMI 34.67 kg/m   BMI: Body mass index is 34.67 kg/m.  Physical Exam Vitals reviewed.  Constitutional:      Appearance: He is well-developed.  HENT:     Head: Normocephalic and atraumatic.  Eyes:     General:        Right eye: No discharge.        Left eye: No discharge.  Cardiovascular:     Rate and Rhythm: Normal rate and regular rhythm.     Heart sounds: Normal heart sounds, S1 normal and S2 normal.  Heart sounds not distant. No midsystolic click and no opening snap. No murmur heard.    No friction rub.  Pulmonary:     Effort: Pulmonary effort is normal. No respiratory distress.     Breath sounds: Normal breath sounds. No decreased breath sounds, wheezing, rhonchi or rales.  Musculoskeletal:     Cervical back: Normal range of motion.     Right lower leg: No edema.     Left lower leg: No edema.  Skin:    General: Skin is warm and dry.     Nails: There is no clubbing.  Neurological:     Mental Status: He is alert and oriented to person, place, and time.  Psychiatric:        Speech: Speech normal.        Behavior: Behavior  normal.        Thought Content: Thought content normal.        Judgment: Judgment normal.     Wt Readings from Last 3 Encounters:  05/13/24 234 lb 12.8 oz (106.5 kg)  04/30/24 228 lb (103.4 kg)  04/07/24 233 lb (105.7 kg)     ASSESSMENT & PLAN:   Paroxysmal atrial flutter: Maintaining sinus rhythm on Lopressor  100 mg twice daily Cardizem  CD 120 mg.  CHADS2VASc 3 (CHF, HTN, vascular disease).  He remains on apixaban  5 mg twice daily and does not meet reduced dosing criteria.  Recent labs stable.  Nonsustained PSVT: Quiescent.  He remains on Cardizem  CD 120 mg and Lopressor  100 mg twice daily.  HFimpEF: Euvolemic and well compensated.  Continue current pharmacotherapy including enalapril  20 mg twice daily, Lopressor  100 mg twice daily, and torsemide  10 mg daily.  Defer escalation of pharmacotherapy at this time with improvement in LV systolic function, and in the context of lack of heart failure symptoms.  Recent labs stable.    Ascending thoracic aortic aneurysm: CTA of the aorta in 03/2024 showed the aorta measuring 3.9 cm in maximal diameter which was within normal limits with no evidence of thoracic aortic aneurysm or dissection.  Coronary artery calcification/HLD: LDL 88 in 03/2024 with normal AST/ALT at that time.  This is improved from 138 in 12/2023.  Target LDL less than 70.  Now on atorvastatin  80 mg daily.  Anticipate follow-up fasting lipid panel and AST/ALT in late 05/2024.  HTN: Blood pressure is well controlled.  He remains on Cardizem  CD 120 mg, enalapril  20 mg twice daily, and Lopressor  100 mg twice daily.  Thyroid  nodule: Stable, measuring 3 cm on CTA of the aorta in 03/2024.  Previously evaluated by FNA in 2016 and was benign, with repeat ultrasound in 04/2023 showing stable in size recommendation for routine imaging/surveillance.  Patient and his PCP have been made aware of findings.  Chronic hypoxic respiratory failure with pulmonary sarcoidosis and fibrosis: Stable.   Follow-up with pulmonology as directed.  Pursue cardiac MRI to evaluate for cardiac sarcoid.     Disposition: F/u with Dr. Gollan or an APP in 4 months.   Medication Adjustments/Labs and Tests Ordered: Current medicines are reviewed at length with the patient today.  Concerns regarding medicines are outlined above. Medication changes, Labs and Tests ordered today are summarized above and listed in the Patient Instructions accessible in Encounters.   Signed, Bernardino Bring, PA-C 05/13/2024 9:54 AM     [facility] [place] [address] [place], [place] [address] [phone]     [1]  Current Meds  Medication Sig  albuterol  (VENTOLIN  HFA) 108 (90 Base) MCG/ACT inhaler Inhale into the lungs as needed.   atorvastatin  (LIPITOR) 80 MG tablet Take 1 tablet (80 mg total) by mouth daily.   azaTHIOprine (IMURAN) 50 MG tablet Take 50 mg by mouth daily.   BENLYSTA 200 MG/ML SOAJ Inject 200 mg into the skin once a week.   colchicine  0.6 MG tablet Take 0.6 mg by mouth daily.   [employer] NASAL ALLERGY SPRAY 55 MCG/ACT AERO nasal inhaler SMARTSIG:2 Spray(s) Both Nares Daily   diltiazem  (CARDIZEM  CD) 120 MG 24 hr capsule TAKE 1 CAPSULE TWICE DAILY   ELIQUIS  5 MG TABS tablet TAKE 1 TABLET TWICE DAILY   enalapril  (VASOTEC ) 20 MG tablet Take 1 tablet (20 mg total) by mouth 2 (two) times daily.   febuxostat (ULORIC) 40 MG tablet Take 40 mg by mouth daily.   fluticasone -salmeterol (ADVAIR) 250-50 MCG/ACT AEPB Inhale into the lungs daily.   hydrocortisone 2.5 % cream Apply 1 Application topically 2 (two) times daily.   hydroxychloroquine (PLAQUENIL) 200 MG tablet Take 1 tablet by mouth 2 (two) times daily.   ketoconazole (NIZORAL) 2 % shampoo Apply topically 3 (three) times a week.   metoprolol  tartrate (LOPRESSOR ) 100 MG tablet TAKE 1 TABLET TWICE DAILY   Multiple Vitamin (MULTI-VITAMINS) TABS Take by mouth.   mycophenolate (CELLCEPT) 500 MG tablet Take 2 tablets by mouth 2 (two)  times daily.   pantoprazole  (PROTONIX ) 40 MG tablet Take 1 tablet (40 mg total) by mouth 2 (two) times daily.   potassium chloride  SA (KLOR-CON  M) 20 MEQ tablet TAKE 1 TABLET EVERY DAY   predniSONE  (DELTASONE ) 5 MG tablet Take 10 mg by mouth daily with breakfast.   sildenafil  (VIAGRA ) 50 MG tablet Take 1-2 tablets (50-100 mg total) by mouth daily as needed for erectile dysfunction.   tadalafil  (CIALIS ) 10 MG tablet Take 1-2 tablets (10-20 mg total) by mouth daily as needed (take 1 hour prior to sexual activity).   tamsulosin  (FLOMAX ) 0.4 MG CAPS capsule Take 1 capsule (0.4 mg total) by mouth daily.   torsemide  (DEMADEX ) 20 MG tablet TAKE 1/2 TABLET EVERY DAY

## 2024-05-13 ENCOUNTER — Ambulatory Visit: Attending: Physician Assistant | Admitting: Physician Assistant

## 2024-05-13 ENCOUNTER — Encounter: Payer: Self-pay | Admitting: Physician Assistant

## 2024-05-13 VITALS — BP 118/80 | HR 63 | Ht 69.0 in | Wt 234.8 lb

## 2024-05-13 DIAGNOSIS — E785 Hyperlipidemia, unspecified: Secondary | ICD-10-CM

## 2024-05-13 DIAGNOSIS — E041 Nontoxic single thyroid nodule: Secondary | ICD-10-CM

## 2024-05-13 DIAGNOSIS — I4892 Unspecified atrial flutter: Secondary | ICD-10-CM

## 2024-05-13 DIAGNOSIS — I10 Essential (primary) hypertension: Secondary | ICD-10-CM

## 2024-05-13 DIAGNOSIS — D860 Sarcoidosis of lung: Secondary | ICD-10-CM

## 2024-05-13 DIAGNOSIS — I7121 Aneurysm of the ascending aorta, without rupture: Secondary | ICD-10-CM

## 2024-05-13 DIAGNOSIS — I471 Supraventricular tachycardia, unspecified: Secondary | ICD-10-CM

## 2024-05-13 DIAGNOSIS — I2510 Atherosclerotic heart disease of native coronary artery without angina pectoris: Secondary | ICD-10-CM

## 2024-05-13 DIAGNOSIS — I5020 Unspecified systolic (congestive) heart failure: Secondary | ICD-10-CM

## 2024-05-13 NOTE — Patient Instructions (Signed)
 Medication Instructions:  Your physician recommends that you continue on your current medications as directed. Please refer to the Current Medication list given to you today.   *If you need a refill on your cardiac medications before your next appointment, please call your pharmacy*  Lab Work: Your provider would like for you to return in the end of January to have the following labs drawn: Fasting Lipids and liver panel.   Please go to [facility] [address] ([facility] [address], [place] [address] You do not need an appointment.  They are open from 8 am- 4:30 pm.  Lunch from 1:00 pm- 2:00 pm You DO need to be fasting.   You may also go to one of the following [facility]:  [address] [place], [place] [address] Phone: [phone] Lab hours: Mon-Fri 8 am- 5 pm    Lunch 12 pm- 1 pm  [address] [place],  [place]  [address]  [place]  Phone: [phone] Lab hours: 7 am- 4 pm Lunch 12 pm-1 pm   [address] [place],  [place]  [address]  [place]  Phone: [phone] Lab hours: Mon-Fri 8 am- 5 pm    Lunch 12 pm- 1 pm  If you have labs (blood work) drawn today and your tests are completely normal, you will receive your results only by: MyChart Message (if you have MyChart) OR A paper copy in the mail If you have any lab test that is abnormal or we need to change your treatment, we will call you to review the results.  Testing/Procedures:   You are scheduled for Cardiac MRI at the location below.  Please arrive for your appointment at ______________ . ?  [facility] [address] [place], [place] [address] Please take advantage of the free valet parking available at the [facility] and [employer] (Entrance C).  Proceed to the [facility] Radiology Department (First Floor) for check-in.   OR   [facility] [address] [place], [place] [address] Please go to the [facility] and check-in with the desk attendant.    Magnetic resonance imaging (MRI) is a painless test that produces images of the inside of the body without using Xrays.  During an MRI, strong magnets and radio waves work together in a data processing manager to form detailed images.   MRI images may provide more details about a medical condition than X-rays, CT scans, and ultrasounds can provide.  You may be given earphones to listen for instructions.  You may eat a light breakfast and take medications as ordered with the exception of furosemide , hydrochlorothiazide , chlorthalidone or spironolactone (or any other fluid pill). If you are undergoing a stress MRI, please avoid stimulants for 12 hr prior to test. (I.e. Caffeine, nicotine, chocolate, or antihistamine medications)  If your provider has ordered anti-anxiety medications for this test, then you will need a driver.  An IV will be inserted into one of your veins. Contrast material will be injected into your IV. It will leave your body through your urine within a day. You may be told to drink plenty of fluids to help flush the contrast material out of your system.  You will be asked to remove all metal, including: Watch, jewelry, and other metal objects including hearing aids, hair pieces and dentures. Also wearable glucose monitoring systems (ie. Freestyle Libre and Omnipods) (Braces and fillings normally are not a problem.)   TEST WILL TAKE APPROXIMATELY 1 HOUR  PLEASE NOTIFY SCHEDULING AT  LEAST 24 HOURS IN ADVANCE IF YOU ARE UNABLE TO KEEP YOUR APPOINTMENT. [phone]  For more information and frequently asked questions, please visit our website : [email or web address]  Please call the Cardiac Imaging Nurse Navigators with any questions/concerns. [phone] Office    Follow-Up: At [facility], you and your health needs are our priority.  As part of our continuing mission to provide you with exceptional heart care, our providers are all part of one team.   This team includes your primary Cardiologist (physician) and Advanced Practice Providers or APPs (Physician Assistants and Nurse Practitioners) who all work together to provide you with the care you need, when you need it.  Your next appointment:   4 month(s)  Provider:   You may see Timothy Gollan, MD or Bernardino Bring, PA-C

## 2024-06-05 ENCOUNTER — Other Ambulatory Visit: Payer: Self-pay | Admitting: Cardiovascular Disease

## 2024-06-10 ENCOUNTER — Encounter (HOSPITAL_COMMUNITY): Payer: Self-pay

## 2024-06-11 ENCOUNTER — Other Ambulatory Visit: Payer: Self-pay

## 2024-06-11 ENCOUNTER — Ambulatory Visit
Admission: RE | Admit: 2024-06-11 | Discharge: 2024-06-11 | Disposition: A | Discharge status: Home / Self Care | Source: Ambulatory Visit | Attending: Physician Assistant | Admitting: Physician Assistant

## 2024-06-11 ENCOUNTER — Other Ambulatory Visit

## 2024-06-11 ENCOUNTER — Other Ambulatory Visit: Payer: Self-pay | Admitting: Physician Assistant

## 2024-06-11 ENCOUNTER — Encounter: Payer: Self-pay | Admitting: Physician Assistant

## 2024-06-11 DIAGNOSIS — D860 Sarcoidosis of lung: Secondary | ICD-10-CM

## 2024-06-11 DIAGNOSIS — R972 Elevated prostate specific antigen [PSA]: Secondary | ICD-10-CM

## 2024-06-11 MED ORDER — GADOBUTROL 1 MMOL/ML IV SOLN
13.0000 mL | Freq: Once | INTRAVENOUS | Status: AC | PRN
  Administered 2024-06-11: 13 mL via INTRAVENOUS

## 2024-06-12 ENCOUNTER — Ambulatory Visit: Payer: Self-pay | Admitting: Physician Assistant

## 2024-06-13 LAB — PHI SCORE REFLEX
% Free PSA: 30.8 %
PSA, Free: 1.41 ng/mL
Prostate Heath Index Score: 50.3
p2PSA: 33.1 pg/mL

## 2024-06-13 LAB — PROSTATE HEALTH INDEX: Prostate Specific Ag: 4.6 ng/mL — ABNORMAL HIGH (ref 0.0–3.9)

## 2024-06-19 ENCOUNTER — Ambulatory Visit: Admitting: Urology

## 2024-06-19 VITALS — BP 142/84 | HR 73 | Ht 67.0 in | Wt 230.0 lb

## 2024-06-19 DIAGNOSIS — R972 Elevated prostate specific antigen [PSA]: Secondary | ICD-10-CM | POA: Diagnosis not present

## 2024-06-19 DIAGNOSIS — N401 Enlarged prostate with lower urinary tract symptoms: Secondary | ICD-10-CM | POA: Diagnosis not present

## 2024-06-19 DIAGNOSIS — N138 Other obstructive and reflux uropathy: Secondary | ICD-10-CM

## 2024-06-19 NOTE — Patient Instructions (Signed)

## 2024-06-19 NOTE — Progress Notes (Signed)
" ° °  06/19/2024 8:22 AM   [name] 01/03/1961 [id number]  Reason for visit: Right scrotal swelling, elevated PSA, prostate nodule, ED, BPH  History: Number of comorbidities including obesity with BMI of 36, A-fib on anticoagulation, diuretic use Prostate nodule per urology PA and they ordered a prostate MRI in 2022 which was benign, PSA was normal at 2.7 Isolated elevated PSA up to 10.9 at follow-up that decreased to 4.2 and recheck with reassuring elevated percentage free and he deferred biopsy.   ED responsive to cialis  10 to 20 mg on demand Right sided hydrocele confirmed on prior scrotal ultrasound On Flomax  nightly for BPH with excellent results  Physical Exam: BP (!) 142/84 (BP Location: Left Arm, Patient Position: Sitting, Cuff Size: Normal)   Pulse 73   Ht 5' 7 (1.702 m)   Wt 230 lb (104.3 kg)   SpO2 96%   BMI 36.02 kg/m   Imaging/labs: I personally viewed and interpreted the prostate MRI from February 2022 showing a 44 g prostate with no suspicious lesions Personally viewed and interpreted the scrotal ultrasound October 2022 showing moderate size right hydrocele PSA 06/11/2024 4.6(30% free, PHI score 50.3), <5% chance of prostate cancer on biopsy based on percentage free  Today: Doing well, no significant complaints today, here to review most recent PSA results Cialis  working well for ED, Flomax  working well for BPH   Plan:   Elevated PSA: History of negative prostate MRI, low PSA density, PSA stable and only mildly elevated with reassuring percentage free indicating less than 5% chance of prostate cancer on biopsy.  We discussed options including repeat MRI, prostate biopsy, or ongoing PSA monitoring.  With his other comorbidities he opts to continue PSA monitoring yearly which I think is very reasonable, risks and benefits were discussed Right scrotal swelling: Continue observation ED: Well-controlled on Cialis , refilled BPH: Well-controlled on Flomax ,  refilled RTC 1 year PSA reflex to free prior, PVR   Devin JAYSON Burnet, MD  [facility] [address] [place], [place] [address] [phone]  "

## 2024-06-20 ENCOUNTER — Other Ambulatory Visit: Payer: Self-pay

## 2024-06-20 MED ORDER — PANTOPRAZOLE SODIUM 40 MG PO TBEC: 40.0000 mg | DELAYED_RELEASE_TABLET | Freq: Two times a day (BID) | ORAL | 0 refills | Status: AC

## 2024-07-25 ENCOUNTER — Ambulatory Visit: Admitting: Student

## 2024-09-16 ENCOUNTER — Ambulatory Visit: Admitting: Physician Assistant

## 2024-11-06 ENCOUNTER — Ambulatory Visit

## 2025-06-12 ENCOUNTER — Other Ambulatory Visit

## 2025-06-19 ENCOUNTER — Ambulatory Visit: Admitting: Urology
